# Patient Record
Sex: Female | Born: 1935 | Race: Black or African American | Hispanic: No | Smoking: Former smoker
Health system: Southern US, Community
[De-identification: ages and names within clinical notes are randomized; demographics above are authoritative.]

## PROBLEM LIST (undated history)

## (undated) DIAGNOSIS — Z8673 Personal history of transient ischemic attack (TIA), and cerebral infarction without residual deficits: Secondary | ICD-10-CM

## (undated) DIAGNOSIS — M75100 Unspecified rotator cuff tear or rupture of unspecified shoulder, not specified as traumatic: Secondary | ICD-10-CM

## (undated) DIAGNOSIS — Z9181 History of falling: Secondary | ICD-10-CM

## (undated) DIAGNOSIS — R29898 Other symptoms and signs involving the musculoskeletal system: Secondary | ICD-10-CM

## (undated) DIAGNOSIS — IMO0002 Reserved for concepts with insufficient information to code with codable children: Secondary | ICD-10-CM

## (undated) DIAGNOSIS — I5032 Chronic diastolic (congestive) heart failure: Secondary | ICD-10-CM

## (undated) DIAGNOSIS — I10 Essential (primary) hypertension: Secondary | ICD-10-CM

## (undated) DIAGNOSIS — K219 Gastro-esophageal reflux disease without esophagitis: Secondary | ICD-10-CM

## (undated) DIAGNOSIS — K279 Peptic ulcer, site unspecified, unspecified as acute or chronic, without hemorrhage or perforation: Secondary | ICD-10-CM

## (undated) DIAGNOSIS — Z9289 Personal history of other medical treatment: Secondary | ICD-10-CM

## (undated) DIAGNOSIS — G5603 Carpal tunnel syndrome, bilateral upper limbs: Secondary | ICD-10-CM

## (undated) DIAGNOSIS — E785 Hyperlipidemia, unspecified: Secondary | ICD-10-CM

## (undated) DIAGNOSIS — M1990 Unspecified osteoarthritis, unspecified site: Secondary | ICD-10-CM

## (undated) DIAGNOSIS — I340 Nonrheumatic mitral (valve) insufficiency: Secondary | ICD-10-CM

## (undated) DIAGNOSIS — E1140 Type 2 diabetes mellitus with diabetic neuropathy, unspecified: Secondary | ICD-10-CM

## (undated) DIAGNOSIS — Z9989 Dependence on other enabling machines and devices: Secondary | ICD-10-CM

## (undated) DIAGNOSIS — H269 Unspecified cataract: Secondary | ICD-10-CM

## (undated) HISTORY — DX: Personal history of other medical treatment: Z92.89

## (undated) HISTORY — PX: APPENDECTOMY: SHX54

## (undated) HISTORY — PX: CARDIOVASCULAR STRESS TEST: SHX262

## (undated) HISTORY — PX: EYE SURGERY: SHX253

## (undated) HISTORY — PX: SPINE SURGERY: SHX786

## (undated) HISTORY — DX: Chronic diastolic (congestive) heart failure: I50.32

## (undated) HISTORY — DX: Nonrheumatic mitral (valve) insufficiency: I34.0

## (undated) HISTORY — DX: Peptic ulcer, site unspecified, unspecified as acute or chronic, without hemorrhage or perforation: K27.9

## (undated) HISTORY — PX: TRANSTHORACIC ECHOCARDIOGRAM: SHX275

---

## 1964-05-26 HISTORY — PX: ABDOMINAL HYSTERECTOMY: SHX81

## 1998-02-13 ENCOUNTER — Encounter: Payer: Self-pay | Admitting: Ophthalmology

## 1998-02-15 ENCOUNTER — Ambulatory Visit (HOSPITAL_COMMUNITY): Admission: RE | Admit: 1998-02-15 | Discharge: 1998-02-15 | Payer: Self-pay | Admitting: Ophthalmology

## 1998-05-03 ENCOUNTER — Ambulatory Visit (HOSPITAL_COMMUNITY): Admission: RE | Admit: 1998-05-03 | Discharge: 1998-05-03 | Payer: Self-pay | Admitting: Ophthalmology

## 1998-05-21 ENCOUNTER — Ambulatory Visit (HOSPITAL_COMMUNITY): Admission: RE | Admit: 1998-05-21 | Discharge: 1998-05-21 | Payer: Self-pay | Admitting: Ophthalmology

## 1998-05-21 ENCOUNTER — Emergency Department (HOSPITAL_COMMUNITY): Admission: EM | Admit: 1998-05-21 | Discharge: 1998-05-21 | Payer: Self-pay | Admitting: Emergency Medicine

## 1998-07-16 ENCOUNTER — Ambulatory Visit (HOSPITAL_COMMUNITY): Admission: RE | Admit: 1998-07-16 | Discharge: 1998-07-16 | Payer: Self-pay | Admitting: Ophthalmology

## 1999-09-18 ENCOUNTER — Other Ambulatory Visit: Admission: RE | Admit: 1999-09-18 | Discharge: 1999-09-18 | Payer: Self-pay | Admitting: Emergency Medicine

## 2001-02-24 ENCOUNTER — Encounter: Payer: Self-pay | Admitting: Emergency Medicine

## 2001-02-24 ENCOUNTER — Encounter: Admission: RE | Admit: 2001-02-24 | Discharge: 2001-02-24 | Payer: Self-pay | Admitting: Emergency Medicine

## 2001-09-15 ENCOUNTER — Encounter: Payer: Self-pay | Admitting: Emergency Medicine

## 2001-09-15 ENCOUNTER — Encounter: Admission: RE | Admit: 2001-09-15 | Discharge: 2001-09-15 | Payer: Self-pay | Admitting: Emergency Medicine

## 2002-04-20 ENCOUNTER — Emergency Department (HOSPITAL_COMMUNITY): Admission: EM | Admit: 2002-04-20 | Discharge: 2002-04-20 | Payer: Self-pay | Admitting: Emergency Medicine

## 2002-04-20 ENCOUNTER — Encounter: Payer: Self-pay | Admitting: Emergency Medicine

## 2002-10-06 ENCOUNTER — Encounter: Payer: Self-pay | Admitting: Emergency Medicine

## 2002-10-06 ENCOUNTER — Encounter: Admission: RE | Admit: 2002-10-06 | Discharge: 2002-10-06 | Payer: Self-pay | Admitting: Emergency Medicine

## 2003-06-03 ENCOUNTER — Emergency Department (HOSPITAL_COMMUNITY): Admission: EM | Admit: 2003-06-03 | Discharge: 2003-06-03 | Payer: Self-pay | Admitting: Emergency Medicine

## 2003-08-23 ENCOUNTER — Encounter: Admission: RE | Admit: 2003-08-23 | Discharge: 2003-08-23 | Payer: Self-pay

## 2003-09-19 ENCOUNTER — Encounter: Admission: RE | Admit: 2003-09-19 | Discharge: 2003-09-19 | Payer: Self-pay | Admitting: Emergency Medicine

## 2003-11-17 ENCOUNTER — Ambulatory Visit (HOSPITAL_COMMUNITY): Admission: RE | Admit: 2003-11-17 | Discharge: 2003-11-17 | Payer: Self-pay | Admitting: Emergency Medicine

## 2003-11-21 ENCOUNTER — Encounter: Admission: RE | Admit: 2003-11-21 | Discharge: 2003-11-21 | Payer: Self-pay | Admitting: Ophthalmology

## 2004-02-05 ENCOUNTER — Encounter: Admission: RE | Admit: 2004-02-05 | Discharge: 2004-02-05 | Payer: Self-pay | Admitting: Emergency Medicine

## 2004-09-16 ENCOUNTER — Ambulatory Visit: Payer: Self-pay | Admitting: Internal Medicine

## 2004-09-30 ENCOUNTER — Ambulatory Visit: Payer: Self-pay | Admitting: Internal Medicine

## 2004-12-23 ENCOUNTER — Encounter: Admission: RE | Admit: 2004-12-23 | Discharge: 2004-12-23 | Payer: Self-pay | Admitting: Emergency Medicine

## 2008-07-19 ENCOUNTER — Inpatient Hospital Stay (HOSPITAL_COMMUNITY): Admission: EM | Admit: 2008-07-19 | Discharge: 2008-07-22 | Payer: Self-pay | Admitting: Emergency Medicine

## 2008-11-23 ENCOUNTER — Emergency Department (HOSPITAL_COMMUNITY): Admission: EM | Admit: 2008-11-23 | Discharge: 2008-11-23 | Payer: Self-pay | Admitting: Emergency Medicine

## 2009-05-28 ENCOUNTER — Emergency Department (HOSPITAL_COMMUNITY): Admission: EM | Admit: 2009-05-28 | Discharge: 2009-05-28 | Payer: Self-pay | Admitting: Emergency Medicine

## 2010-01-11 ENCOUNTER — Ambulatory Visit (HOSPITAL_COMMUNITY): Admission: RE | Admit: 2010-01-11 | Discharge: 2010-01-11 | Payer: Self-pay | Admitting: Family Medicine

## 2010-08-11 LAB — CBC
MCV: 92.9 fL (ref 78.0–100.0)
Platelets: 298 10*3/uL (ref 150–400)
RBC: 3.51 MIL/uL — ABNORMAL LOW (ref 3.87–5.11)

## 2010-08-11 LAB — DIFFERENTIAL
Basophils Absolute: 0 10*3/uL (ref 0.0–0.1)
Lymphocytes Relative: 16 % (ref 12–46)
Lymphs Abs: 1.5 10*3/uL (ref 0.7–4.0)
Monocytes Absolute: 0.4 10*3/uL (ref 0.1–1.0)
Monocytes Relative: 4 % (ref 3–12)
Neutro Abs: 7.3 10*3/uL (ref 1.7–7.7)

## 2010-08-11 LAB — BASIC METABOLIC PANEL
CO2: 28 mEq/L (ref 19–32)
Chloride: 106 mEq/L (ref 96–112)
GFR calc Af Amer: 54 mL/min — ABNORMAL LOW (ref >60)
Potassium: 4 mEq/L (ref 3.5–5.1)
Sodium: 141 mEq/L (ref 135–145)

## 2010-09-10 LAB — BASIC METABOLIC PANEL
CO2: 26 mEq/L (ref 19–32)
Chloride: 108 mEq/L (ref 96–112)
Creatinine, Ser: 1.12 mg/dL (ref 0.4–1.2)
GFR calc Af Amer: 56 mL/min — ABNORMAL LOW (ref >60)
Potassium: 3.7 mEq/L (ref 3.5–5.1)
Sodium: 140 mEq/L (ref 135–145)

## 2010-09-10 LAB — GLUCOSE, CAPILLARY
Glucose-Capillary: 119 mg/dL — ABNORMAL HIGH (ref 70–99)
Glucose-Capillary: 135 mg/dL — ABNORMAL HIGH (ref 70–99)
Glucose-Capillary: 67 mg/dL — ABNORMAL LOW (ref 70–99)
Glucose-Capillary: 69 mg/dL — ABNORMAL LOW (ref 70–99)
Glucose-Capillary: 80 mg/dL (ref 70–99)
Glucose-Capillary: 96 mg/dL (ref 70–99)

## 2010-09-10 LAB — COMPREHENSIVE METABOLIC PANEL
ALT: 19 U/L (ref 0–35)
AST: 23 U/L (ref 0–37)
Albumin: 3.6 g/dL (ref 3.5–5.2)
BUN: 20 mg/dL (ref 6–23)
Chloride: 107 mEq/L (ref 96–112)
Creatinine, Ser: 1.37 mg/dL — ABNORMAL HIGH (ref 0.4–1.2)
GFR calc Af Amer: 45 mL/min — ABNORMAL LOW (ref >60)
GFR calc non Af Amer: 37 mL/min — ABNORMAL LOW (ref >60)
Glucose, Bld: 131 mg/dL — ABNORMAL HIGH (ref 70–99)
Sodium: 141 mEq/L (ref 135–145)
Total Protein: 6.8 g/dL (ref 6.0–8.3)

## 2010-09-10 LAB — URINALYSIS, ROUTINE W REFLEX MICROSCOPIC
Hgb urine dipstick: NEGATIVE
Ketones, ur: NEGATIVE mg/dL
Nitrite: NEGATIVE
Protein, ur: NEGATIVE mg/dL
Urobilinogen, UA: 0.2 mg/dL (ref 0.0–1.0)

## 2010-09-10 LAB — URINE CULTURE: Colony Count: 40000

## 2010-09-10 LAB — POCT CARDIAC MARKERS
CKMB, poc: 1.8 ng/mL (ref 1.0–8.0)
Myoglobin, poc: 112 ng/mL (ref 12–200)
Troponin i, poc: <0.05 ng/mL (ref 0.00–0.09)

## 2010-09-10 LAB — CBC
HCT: 35.4 % — ABNORMAL LOW (ref 36.0–46.0)
Hemoglobin: 11.3 g/dL — ABNORMAL LOW (ref 12.0–15.0)
MCHC: 33.7 g/dL (ref 30.0–36.0)
MCV: 91.7 fL (ref 78.0–100.0)
Platelets: 246 10*3/uL (ref 150–400)
RBC: 3.7 MIL/uL — ABNORMAL LOW (ref 3.87–5.11)
WBC: 7.3 10*3/uL (ref 4.0–10.5)
WBC: 7.7 10*3/uL (ref 4.0–10.5)

## 2010-09-10 LAB — BRAIN NATRIURETIC PEPTIDE: Pro B Natriuretic peptide (BNP): 103 pg/mL — ABNORMAL HIGH (ref 0.0–100.0)

## 2010-09-10 LAB — DIFFERENTIAL
Basophils Absolute: 0 10*3/uL (ref 0.0–0.1)
Eosinophils Absolute: 0.2 10*3/uL (ref 0.0–0.7)
Eosinophils Absolute: 0.2 10*3/uL (ref 0.0–0.7)
Eosinophils Relative: 2 % (ref 0–5)
Lymphs Abs: 1.9 10*3/uL (ref 0.7–4.0)
Monocytes Absolute: 0.5 10*3/uL (ref 0.1–1.0)
Monocytes Relative: 7 % (ref 3–12)
Monocytes Relative: 7 % (ref 3–12)
Neutrophils Relative %: 64 % (ref 43–77)

## 2010-09-10 LAB — HEMOGLOBIN A1C: Mean Plasma Glucose: 134 mg/dL

## 2010-10-08 NOTE — Discharge Summary (Signed)
[name], [name]                ACCOUNT NO.:  [id number]   MEDICAL RECORD NO.:  [id number]          PATIENT TYPE:  INP   LOCATION:  [id number]                          FACILITY:  [facility]   PHYSICIAN:  Bonnielee Haff, MD     DATE OF BIRTH:  [birth year]   DATE OF ADMISSION:  07/19/2008  DATE OF DISCHARGE:  02/27/2010LH                               DISCHARGE SUMMARY   PRIMARY CARE PHYSICIAN:  Iona Beard, M.D.   DISCHARGE DIAGNOSES:  1. Left lower lobe pneumonia, improved.  2. Hypertension, stable.  3. Bronchitis, stable.   Please review H&P dictated by Dr.Medaiyese for details regarding the  patient's presenting illness.   BRIEF HOSPITAL COURSE:  Briefly, this is an 77 year old African American  female who presented with increasing shortness of breath and cough for 1  week.  She was found to have a left lower lobe pneumonia.  The patient  was started on IV antibiotics and she slowly improved.  She was also  found to be wheezing and a beta-blocker was held.  She was started on  nebulizer treatments and that has also improved quite significantly.  She was not started on steroids as she did not require it at this time.   Her antibiotic was changed over to p.o. yesterday.  She is very keen on  going home today.   She does not have any complaints.  She mentioned her cough and wheezing  both are better.  Examination reveals lungs actually are quite clear to  auscultation.  No wheezing, rales or rhonchi.  Cardiovascular system is  normal and regular.  Vital signs are all stable.  Blood pressure was  142/72 this morning, saturation 97% on room air.   So overall I think she is quite stable for discharge.  She was noted to  have a blood sugar of 80 on one occasion.  I have discussed this issue  with her and I have told her that it might be desirable if she  experiences more low blood sugars to cut her dose of glipizide to 5 mg,  but I have told her to discuss this with her primary medical  doctor.   I also told her to stop taking her metoprolol if she experiences a lot  of wheezing at home.   She needs to avoid exposure to cold.  I have also asked her to see her  doctor within the next 3-4 days.   DISCHARGE MEDICATIONS:  1. Levaquin 5 mg daily for 4 days starting tomorrow July 23, 2008.  2. Albuterol MDI 2 puffs inhaled 4-6 hours as needed for wheezing.  3. Lisinopril 40 mg daily.  4. Glipizide 10 mg daily.  5. Allopurinol 3 mg daily.  6. Maalox 15 mg daily.  7. Probenecid 500 mg daily.  8. Lopressor 25 mg daily.  9. Amlodipine 10 mg daily.   FOLLOW UP:  With Dr. Berdine Addison within 1 week.   DIET:  Modified carbohydrate.   PHYSICAL ACTIVITY:  Increase activity slowly.  No exertion and no  exposure to cold in the next 1 week.   No  consultations obtained during this admission.   Total time of this encounter 35 minutes.      Bonnielee Haff, MD  Electronically Signed     GK/MEDQ  D:  07/22/2008  T:  07/22/2008  Job:  [id number]   cc:   Barrie Folk. Berdine Addison, MD  Fax: [phone]

## 2010-10-08 NOTE — H&P (Signed)
[name], [name]                ACCOUNT NO.:  [id number]   MEDICAL RECORD NO.:  [id number]          PATIENT TYPE:  INP   LOCATION:  IC02                          FACILITY:  [facility]   PHYSICIAN:  Audria Nine, M.D.DATE OF BIRTH:  14-Jan-1924   DATE OF ADMISSION:  07/19/2008  DATE OF DISCHARGE:  LH                              HISTORY & PHYSICAL   ADMISSION DIAGNOSES:  1. Left lower lobe pneumonia.  2. Poorly controlled hypertension.  3. Bronchitic asthmatic bronchitis.   CHIEF COMPLAINTS:  Increased shortness of breath and cough over the past  one week.   HISTORY OF PRESENT ILLNESS:  [name] is an 77 year old female who  presented to the emergency room complaining of increasing shortness of  air.  The patient said it started about 7 days ago.  Apparently, there  has been a virus that has been going around the entire family, and  everyone at home has been sick from it.  Coughing up some yellowish  green sputum occasionally with no blood.  She also reports significant  nasal congestion and wheezing.  The patient denies any fevers or chills.  She had some chest discomfort on the right side.   Evaluation in the emergency room with a chest x-ray revealed a left  lower lobe pneumonia.  The patient was started on IV antibiotics  empirically.  The patient remains mostly stable at this time.  The  patient's blood pressure was actually noted to be elevated in the  emergency room.   REVIEW OF SYSTEMS:  As mentioned in history of present illness, the  patient denies any hemoptysis.  No recent weight loss.  No night sweats.  No one tested positive for flu in the home.   PAST MEDICAL HISTORY:  1. Hypertension.  2. Diabetes mellitus.  3. Gout.  4. Hypercholesterolemia.  5. Status post appendectomy.  6. Status post hysterectomy.   SOCIAL HISTORY:  The patient is single, lives with her daughter.  She  quit smoking in 1997.  She does not drink alcohol.  She denies any  illicit  drug use.   MEDICATIONS:  1. Lisinopril 40 mg p.o. once a day.  2. Glipizide 10 mg p.o. once a day.  3. Allopurinol 300 mg p.o. once a day.  4. Meloxicam 50 mg p.o. once a day.  5. Probenecid 500 mg p.o. once a day.  6. Metoprolol 25 mg p.o. once a day.  7. Norvasc 10 mg p.o. once a day.   ALLERGIES:  NO KNOWN DRUG ALLERGIES.   PHYSICAL EXAMINATION:  GENERAL:  The patient was conscious, alert,  comfortable, pleasant, not in acute respiratory distress.  The patient  was having some cough spells while I was there.  VITAL SIGNS:  Blood pressure was 178/82, pulse was 66, respiratory of  28, temperature 98.5 degrees Fahrenheit, oxygen saturation was 98% on  room air.  HEENT:  Exam normocephalic, atraumatic.  Oral mucosa was dry.  No  exudates were noted.  NECK:  Supple.  No JVD or lymphadenopathy.  LUNGS:  Reduced air entry bilaterally, crackles at the left base  posteriorly.  HEART:  S1-S2 regular with no S3, S4, gallops or rubs.  ABDOMEN:  Soft, nontender.  Bowel sounds positive.  No masses palpable.  EXTREMITIES:  No edema, no calf induration or tenderness noted.  CNS:  Grossly intact with no focal neurological deficits.   LABORATORY AND DIAGNOSTIC DATA:  White blood count was 7.7, hemoglobin  11.9, hematocrit 35.4, platelet count was 246 with no left shift.  Sodium is 141, potassium 4.0, chloride 107, CO2 was 25, glucose 131, BUN  of 20, creatinine 1.37, total bilirubin was 0.3, alk-phos 178, AST 23,  ALT of 19, calcium is 9.3.  Cardiac enzymes were negative.  BNP was 103.  Urinalysis was negative.  Urine microscopy was unremarkable.  Chest x-  ray shows a left lower lobe infiltrate.  She had probable atelectasis.   ASSESSMENT/PLAN:  An 77 year old female presenting with increasing  shortness of air.  Chest x-ray suggestive of left lower lobe infiltrate.   PLAN:  1. The patient be admitted to the medical floor.  2. The patient will be placed on Rocephin and Zithromax IV.  3.  The patient will be on DVT prophylaxis with Lovenox.  4. Recent sputum cultures were available.  5. The patient did not show any classic symptoms of the flu.  If the      patient's symptoms continue to persist or worsen, will obtain a      nasopharyngeal swab for the flu virus.  6. I will resume all home blood pressure medications at this time.  7. Will put the patient on a sliding scale insulin.  8. Will put patient on nebulizer therapy for her wheezing.  I do not      see any indications to start her on steroids at this time unless      the wheezing worsens or it becomes a concern.  9. The patient will be placed on a sliding-scale insulin drip.   Discussed the above plan with the patient.  She verbalized full  understanding.      Audria Nine, M.D.  Electronically Signed     AM/MEDQ  D:  07/19/2008  T:  07/20/2008  Job:  [id number]

## 2011-09-05 ENCOUNTER — Other Ambulatory Visit: Payer: Self-pay | Admitting: Orthopedic Surgery

## 2011-09-05 DIAGNOSIS — M5126 Other intervertebral disc displacement, lumbar region: Secondary | ICD-10-CM

## 2011-09-05 DIAGNOSIS — R200 Anesthesia of skin: Secondary | ICD-10-CM

## 2011-09-05 DIAGNOSIS — R531 Weakness: Secondary | ICD-10-CM

## 2011-09-09 ENCOUNTER — Other Ambulatory Visit: Payer: Self-pay | Admitting: Orthopedic Surgery

## 2011-09-09 ENCOUNTER — Ambulatory Visit
Admission: RE | Admit: 2011-09-09 | Discharge: 2011-09-09 | Disposition: A | Discharge status: Home / Self Care | Payer: Medicare Other | Source: Ambulatory Visit | Attending: Orthopedic Surgery | Admitting: Orthopedic Surgery

## 2011-09-09 DIAGNOSIS — R200 Anesthesia of skin: Secondary | ICD-10-CM

## 2011-09-09 DIAGNOSIS — R531 Weakness: Secondary | ICD-10-CM

## 2011-09-09 DIAGNOSIS — M5126 Other intervertebral disc displacement, lumbar region: Secondary | ICD-10-CM

## 2011-09-09 MED ORDER — IOHEXOL 180 MG/ML  SOLN
26.0000 mL | Freq: Once | INTRAMUSCULAR | Status: AC | PRN
  Administered 2011-09-09: 26 mL via INTRATHECAL

## 2011-09-09 MED ORDER — ONDANSETRON HCL 4 MG/2ML IJ SOLN: 4.0000 mg | Freq: Four times a day (QID) | INTRAMUSCULAR | Status: DC | PRN

## 2011-09-09 NOTE — Progress Notes (Signed)
Pt's god daughter with her to listen to discharge instructions and questions were answered, and consent signed.dd

## 2011-09-09 NOTE — Discharge Instructions (Signed)

## 2011-09-09 NOTE — Progress Notes (Signed)
Dr. Jobe Igo in to speak with patient, her daughter and her god-daughter about procedure, complications and plans to continue, if patient willing.  Patient and family state they understand, their questions were answered; are willing to proceed again.  jkl

## 2011-09-12 ENCOUNTER — Encounter (HOSPITAL_BASED_OUTPATIENT_CLINIC_OR_DEPARTMENT_OTHER): Payer: Self-pay | Admitting: *Deleted

## 2011-09-12 NOTE — Progress Notes (Signed)
Left message w/ Elmyra Ricks at Dr. Thompson Caul office for most recent office visit note, ekg and any cardiac studies.

## 2011-09-12 NOTE — Progress Notes (Signed)
Pt instructed npo p mn 2/23 x meds wip sip of water.  To [facility] 2/24 @ 0745.  Needs istat and ekg on arrival.

## 2011-09-17 ENCOUNTER — Encounter (HOSPITAL_BASED_OUTPATIENT_CLINIC_OR_DEPARTMENT_OTHER): Payer: Self-pay | Admitting: Anesthesiology

## 2011-09-17 ENCOUNTER — Ambulatory Visit (HOSPITAL_BASED_OUTPATIENT_CLINIC_OR_DEPARTMENT_OTHER): Payer: Medicare Other | Admitting: Anesthesiology

## 2011-09-17 ENCOUNTER — Ambulatory Visit (HOSPITAL_BASED_OUTPATIENT_CLINIC_OR_DEPARTMENT_OTHER)
Admission: RE | Admit: 2011-09-17 | Discharge: 2011-09-17 | Disposition: A | Discharge status: Home / Self Care | Payer: Medicare Other | Source: Ambulatory Visit | Attending: Orthopedic Surgery | Admitting: Orthopedic Surgery

## 2011-09-17 ENCOUNTER — Encounter (HOSPITAL_BASED_OUTPATIENT_CLINIC_OR_DEPARTMENT_OTHER): Payer: Self-pay | Admitting: *Deleted

## 2011-09-17 ENCOUNTER — Encounter (HOSPITAL_BASED_OUTPATIENT_CLINIC_OR_DEPARTMENT_OTHER)
Admission: RE | Disposition: A | Discharge status: Home / Self Care | Payer: Self-pay | Source: Ambulatory Visit | Attending: Orthopedic Surgery

## 2011-09-17 DIAGNOSIS — Z8673 Personal history of transient ischemic attack (TIA), and cerebral infarction without residual deficits: Secondary | ICD-10-CM | POA: Insufficient documentation

## 2011-09-17 DIAGNOSIS — E1149 Type 2 diabetes mellitus with other diabetic neurological complication: Secondary | ICD-10-CM

## 2011-09-17 DIAGNOSIS — K219 Gastro-esophageal reflux disease without esophagitis: Secondary | ICD-10-CM | POA: Insufficient documentation

## 2011-09-17 DIAGNOSIS — E119 Type 2 diabetes mellitus without complications: Secondary | ICD-10-CM | POA: Insufficient documentation

## 2011-09-17 DIAGNOSIS — G5600 Carpal tunnel syndrome, unspecified upper limb: Secondary | ICD-10-CM | POA: Insufficient documentation

## 2011-09-17 DIAGNOSIS — Z79899 Other long term (current) drug therapy: Secondary | ICD-10-CM | POA: Insufficient documentation

## 2011-09-17 DIAGNOSIS — I10 Essential (primary) hypertension: Secondary | ICD-10-CM | POA: Insufficient documentation

## 2011-09-17 HISTORY — DX: Reserved for concepts with insufficient information to code with codable children: IMO0002

## 2011-09-17 HISTORY — DX: Unspecified osteoarthritis, unspecified site: M19.90

## 2011-09-17 HISTORY — PX: CARPAL TUNNEL RELEASE: SHX101

## 2011-09-17 HISTORY — DX: Carpal tunnel syndrome, bilateral upper limbs: G56.03

## 2011-09-17 HISTORY — DX: Unspecified cataract: H26.9

## 2011-09-17 HISTORY — DX: Gastro-esophageal reflux disease without esophagitis: K21.9

## 2011-09-17 HISTORY — DX: Essential (primary) hypertension: I10

## 2011-09-17 LAB — POCT I-STAT 4, (NA,K, GLUC, HGB,HCT)
Glucose, Bld: 111 mg/dL — ABNORMAL HIGH (ref 70–99)
HCT: 37 % (ref 36.0–46.0)
Hemoglobin: 12.6 g/dL (ref 12.0–15.0)
Potassium: 4.1 meq/L (ref 3.5–5.1)
Sodium: 143 meq/L (ref 135–145)

## 2011-09-17 SURGERY — CARPAL TUNNEL RELEASE
Anesthesia: General | Site: Hand | Laterality: Right | Wound class: Clean

## 2011-09-17 MED ORDER — POVIDONE-IODINE 7.5 % EX SOLN: Freq: Once | CUTANEOUS | Status: DC

## 2011-09-17 MED ORDER — FENTANYL CITRATE 0.05 MG/ML IJ SOLN: 25.0000 ug | INTRAMUSCULAR | Status: DC | PRN

## 2011-09-17 MED ORDER — MEPERIDINE HCL 25 MG/ML IJ SOLN: 6.2500 mg | INTRAMUSCULAR | Status: DC | PRN

## 2011-09-17 MED ORDER — PROPOFOL 10 MG/ML IV EMUL
INTRAVENOUS | Status: DC | PRN
  Administered 2011-09-17: 150 mg via INTRAVENOUS

## 2011-09-17 MED ORDER — ONDANSETRON HCL 4 MG/2ML IJ SOLN
INTRAMUSCULAR | Status: DC | PRN
  Administered 2011-09-17: 4 mg via INTRAVENOUS

## 2011-09-17 MED ORDER — HYDROCODONE-ACETAMINOPHEN 5-325 MG PO TABS: 1.0000 | ORAL_TABLET | Freq: Four times a day (QID) | ORAL | Status: AC | PRN

## 2011-09-17 MED ORDER — 0.9 % SODIUM CHLORIDE (POUR BTL) OPTIME
TOPICAL | Status: DC | PRN
  Administered 2011-09-17: 500 mL

## 2011-09-17 MED ORDER — LACTATED RINGERS IV SOLN: INTRAVENOUS | Status: DC

## 2011-09-17 MED ORDER — FENTANYL CITRATE 0.05 MG/ML IJ SOLN
INTRAMUSCULAR | Status: DC | PRN
  Administered 2011-09-17: 50 ug via INTRAVENOUS

## 2011-09-17 MED ORDER — PROMETHAZINE HCL 25 MG/ML IJ SOLN: 6.2500 mg | INTRAMUSCULAR | Status: DC | PRN

## 2011-09-17 MED ORDER — EPHEDRINE SULFATE 50 MG/ML IJ SOLN
INTRAMUSCULAR | Status: DC | PRN
  Administered 2011-09-17: 10 mg via INTRAVENOUS

## 2011-09-17 MED ORDER — LIDOCAINE HCL (CARDIAC) 20 MG/ML IV SOLN
INTRAVENOUS | Status: DC | PRN
  Administered 2011-09-17: 50 mg via INTRAVENOUS

## 2011-09-17 MED ORDER — LACTATED RINGERS IV SOLN
INTRAVENOUS | Status: DC
  Administered 2011-09-17: 09:00:00 via INTRAVENOUS

## 2011-09-17 SURGICAL SUPPLY — 47 items
BANDAGE CONFORM 3  STR LF (GAUZE/BANDAGES/DRESSINGS) ×2 IMPLANT
BANDAGE ELASTIC 3 VELCRO ST LF (GAUZE/BANDAGES/DRESSINGS) IMPLANT
BANDAGE ELASTIC 4 VELCRO ST LF (GAUZE/BANDAGES/DRESSINGS) ×1 IMPLANT
BLADE SURG 15 STRL LF DISP TIS (BLADE) ×1 IMPLANT
BLADE SURG 15 STRL SS (BLADE) ×2
BNDG CMPR 9X4 STRL LF SNTH (GAUZE/BANDAGES/DRESSINGS) ×1
BNDG ESMARK 4X9 LF (GAUZE/BANDAGES/DRESSINGS) ×2 IMPLANT
CLOTH BEACON ORANGE TIMEOUT ST (SAFETY) ×2 IMPLANT
CORDS BIPOLAR (ELECTRODE) ×2 IMPLANT
COVER TABLE BACK 60X90 (DRAPES) ×2 IMPLANT
DRAPE EXTREMITY T 121X128X90 (DRAPE) ×2 IMPLANT
DRAPE LG THREE QUARTER DISP (DRAPES) ×3 IMPLANT
DRAPE U-SHAPE 47X51 STRL (DRAPES) ×2 IMPLANT
DRSG EMULSION OIL 3X3 NADH (GAUZE/BANDAGES/DRESSINGS) ×2 IMPLANT
DURAPREP 26ML APPLICATOR (WOUND CARE) ×2 IMPLANT
ELECT REM PT RETURN 9FT ADLT (ELECTROSURGICAL)
ELECTRODE REM PT RTRN 9FT ADLT (ELECTROSURGICAL) IMPLANT
GLOVE BIOGEL PI IND STRL 8 (GLOVE) ×1 IMPLANT
GLOVE BIOGEL PI INDICATOR 8 (GLOVE)
GLOVE ECLIPSE 6.5 STRL STRAW (GLOVE) ×1 IMPLANT
GLOVE ECLIPSE 8.0 STRL XLNG CF (GLOVE) ×2 IMPLANT
GLOVE INDICATOR 6.5 STRL GRN (GLOVE) ×1 IMPLANT
GLOVE INDICATOR 8.0 STRL GRN (GLOVE) ×3 IMPLANT
GOWN W/COTTON TOWEL STD LRG (GOWNS) ×2 IMPLANT
GOWN XL W/COTTON TOWEL STD (GOWNS) ×2 IMPLANT
MARKER SKIN DUAL TIP RULER LAB (MISCELLANEOUS) ×2 IMPLANT
NEEDLE 27GAX1X1/2 (NEEDLE) IMPLANT
NEEDLE HYPO 22GX1.5 SAFETY (NEEDLE) IMPLANT
NS IRRIG 500ML POUR BTL (IV SOLUTION) ×2 IMPLANT
PACK BASIN DAY SURGERY FS (CUSTOM PROCEDURE TRAY) ×2 IMPLANT
PAD CAST 3X4 CTTN HI CHSV (CAST SUPPLIES) IMPLANT
PAD CAST 4YDX4 CTTN HI CHSV (CAST SUPPLIES) IMPLANT
PADDING CAST ABS 4INX4YD NS (CAST SUPPLIES) ×1
PADDING CAST ABS COTTON 4X4 ST (CAST SUPPLIES) ×1 IMPLANT
PADDING CAST COTTON 3X4 STRL (CAST SUPPLIES) ×2
PADDING CAST COTTON 4X4 STRL (CAST SUPPLIES) ×2
PENCIL BUTTON HOLSTER BLD 10FT (ELECTRODE) IMPLANT
SPLINT PLASTER CAST XFAST 3X15 (CAST SUPPLIES) IMPLANT
SPLINT PLASTER XTRA FASTSET 3X (CAST SUPPLIES) ×10
SPONGE GAUZE 4X4 12PLY (GAUZE/BANDAGES/DRESSINGS) ×2 IMPLANT
STOCKINETTE 4X48 STRL (DRAPES) ×2 IMPLANT
SUT ETHILON 4 0 PS 2 18 (SUTURE) ×5 IMPLANT
SYR BULB 3OZ (MISCELLANEOUS) ×2 IMPLANT
SYR CONTROL 10ML LL (SYRINGE) IMPLANT
TOWEL OR 17X24 6PK STRL BLUE (TOWEL DISPOSABLE) ×4 IMPLANT
UNDERPAD 30X30 INCONTINENT (UNDERPADS AND DIAPERS) ×2 IMPLANT
WATER STERILE IRR 500ML POUR (IV SOLUTION) ×1 IMPLANT

## 2011-09-17 NOTE — Anesthesia Preprocedure Evaluation (Signed)
Anesthesia Evaluation  Patient identified by MRN, date of birth, ID band Patient awake    Reviewed: Allergy & Precautions, H&P , NPO status , Patient's Chart, lab work & pertinent test results  Airway Mallampati: II TM Distance: >3 FB Neck ROM: Full    Dental No notable dental hx. (+) Upper Dentures   Pulmonary neg pulmonary ROS,  breath sounds clear to auscultation  Pulmonary exam normal       Cardiovascular hypertension, Pt. on medications negative cardio ROS  Rhythm:Regular Rate:Normal     Neuro/Psych  Neuromuscular disease negative neurological ROS  negative psych ROS   GI/Hepatic negative GI ROS, Neg liver ROS,   Endo/Other  negative endocrine ROSDiabetes mellitus-, Type 2, Oral Hypoglycemic Agents  Renal/GU negative Renal ROS  negative genitourinary   Musculoskeletal negative musculoskeletal ROS (+)   Abdominal   Peds negative pediatric ROS (+)  Hematology negative hematology ROS (+)   Anesthesia Other Findings   Reproductive/Obstetrics negative OB ROS                           Anesthesia Physical Anesthesia Plan  ASA: III  Anesthesia Plan: General   Post-op Pain Management:    Induction: Intravenous  Airway Management Planned: LMA  Additional Equipment:   Intra-op Plan:   Post-operative Plan: Extubation in OR  Informed Consent: I have reviewed the patients History and Physical, chart, labs and discussed the procedure including the risks, benefits and alternatives for the proposed anesthesia with the patient or authorized representative who has indicated his/her understanding and acceptance.   Dental advisory given  Plan Discussed with: CRNA  Anesthesia Plan Comments:         Anesthesia Quick Evaluation

## 2011-09-17 NOTE — Transfer of Care (Signed)
Immediate Anesthesia Transfer of Care Note  Patient: [name]  Procedure(s) Performed: Procedure(s) (LRB): CARPAL TUNNEL RELEASE (Right)  Patient Location: PACU  Anesthesia Type: General  Level of Consciousness: awake  Airway & Oxygen Therapy: Patient Spontanous Breathing and Patient connected to face mask oxygen  Post-op Assessment: Report given to PACU RN and Post -op Vital signs reviewed and stable  Post vital signs: Reviewed and stable  Complications: No apparent anesthesia complications

## 2011-09-17 NOTE — Discharge Instructions (Signed)
Carpal Tunnel Release (Repair) Care After Refer to this sheet in the next few weeks. These discharge instructions provide you with general information on caring for yourself after you leave the hospital. Your caregiver may also give you specific instructions. Your treatment has been planned according to the most current medical practices available, but unavoidable complications sometimes occur. If you have any problems or questions after discharge, please call your caregiver. HOME CARE INSTRUCTIONS   Have a responsible person with you for 24 hours.   Do not drive a car or take public transportation for 24 hours.   Only take over-the-counter or prescription medicines for pain, discomfort, or fever as directed by your caregiver. Take them as directed.   You may put ice on the palm side of the affected wrist.   Put ice in a plastic bag.   Place a towel between your skin and the bag.   Leave the ice on for 15 to 20 minutes, 3 to 4 times per day.   If you were given a splint to keep your wrist from bending, use it as directed. It is important to wear the splint at night or as directed. Use the splint for as long as you have pain or numbness in your hand, arm, or wrist. This may take 1 to 2 months.   Keep your hand raised (elevated) above the level of your heart as much as possible. This keeps swelling down and helps with discomfort.   Change bandages (dressings) as directed.   Keep the wound clean and dry.  SEEK MEDICAL CARE IF:   You develop pain not relieved with medicines.   You develop numbness of your hand.   You develop bleeding from your surgical site.   You have an oral temperature above 102 F (38.9 C).   You develop redness or swelling of the surgical site.   You develop new, unexplained problems.  SEEK IMMEDIATE MEDICAL CARE IF:   You develop a rash.   You have difficulty breathing.   You develop any reaction or side effects to medicines given.  MAKE SURE YOU:    Understand these instructions.   Will watch your condition.   Will get help right away if you are not doing well or get worse.  Document Released: 11/29/2004 Document Revised: 05/01/2011 Document Reviewed: 03/18/2007 ExitCare Patient Information 2012 [place], [facility] Post Anesthesia Home Care Instructions  Activity: Get plenty of rest for the remainder of the day. A responsible adult should stay with you for 24 hours following the procedure.  For the next 24 hours, DO NOT: -Drive a car -Paediatric nurse -Drink alcoholic beverages -Take any medication unless instructed by your physician -Make any legal decisions or sign important papers.  Meals: Start with liquid foods such as gelatin or soup. Progress to regular foods as tolerated. Avoid greasy, spicy, heavy foods. If nausea and/or vomiting occur, drink only clear liquids until the nausea and/or vomiting subsides. Call your physician if vomiting continues.  Special Instructions/Symptoms: Your throat may feel dry or sore from the anesthesia or the breathing tube placed in your throat during surgery. If this causes discomfort, gargle with warm salt water. The discomfort should disappear within 24 hours.  Marland Kitchen

## 2011-09-17 NOTE — Brief Op Note (Signed)
09/17/2011  11:24 AM  PATIENT:  [name]  78 y.o. female  PRE-OPERATIVE DIAGNOSIS:  RIGHT CARPAL TUNNEL SYNDROME  POST-OPERATIVE DIAGNOSIS:  RIGHT CARPAL TUNNEL SYNDROME  PROCEDURE:  Procedure(s) (LRB): CARPAL TUNNEL RELEASE (Right)  SURGEON:  Surgeon(s) and Role:    * Magnus Sinning, MD - Primary  PHYSICIAN ASSISTANT:   ASSISTANTS: nurse   ANESTHESIA:   general  EBL:  Total I/O In: 500 [I.V.:500] Out: -   BLOOD ADMINISTERED:none  DRAINS: none   LOCAL MEDICATIONS USED:  NONE  SPECIMEN:  No Specimen  DISPOSITION OF SPECIMEN:  N/A  COUNTS:  YES  TOURNIQUET:   Total Tourniquet Time Documented: Upper Arm (Right) - 37 minutes  DICTATION: .Other Dictation: Dictation Number [id number]  PLAN OF CARE: Discharge to home after PACU  PATIENT DISPOSITION:  PACU - hemodynamically stable.   Delay start of Pharmacological VTE agent (>24hrs) due to surgical blood loss or risk of bleeding: not applicable

## 2011-09-17 NOTE — H&P (Signed)
[name] is an 78 y.o. female.   Chief Complaint: pain and numbness rt hand [id number] symptoms with abnormal NCV's  Past Medical History  Diagnosis Date  . Hypertension   . Stroke     mini stroke yrs ago  . Diabetes mellitus   . GERD (gastroesophageal reflux disease)   . Arthritis   . Slipped intervertebral disc   . Bilateral carpal tunnel syndrome   . Glaucoma   . Cataracts, both eyes     Past Surgical History  Procedure Date  . Abdominal hysterectomy     History reviewed. No pertinent family history. Social History:  reports that she has never smoked. She has never used smokeless tobacco. She reports that she does not drink alcohol. Her drug history not on file.  Allergies: No Known Allergies  Medications Prior to Admission  Medication Sig Dispense Refill  . allopurinol (ZYLOPRIM) 300 MG tablet Take 300 mg by mouth daily.      Marland Kitchen amLODipine (NORVASC) 10 MG tablet Take 10 mg by mouth daily.      Marland Kitchen b complex vitamins tablet Take 1 tablet by mouth daily.      . Difluprednate (DUREZOL OP) Apply to eye 2 (two) times daily.      Marland Kitchen glipiZIDE (GLUCOTROL) 10 MG tablet Take 10 mg by mouth 2 (two) times daily before a meal.      . hydrALAZINE (APRESOLINE) 10 MG tablet Take 10 mg by mouth 3 (three) times daily.      Marland Kitchen lisinopril (PRINIVIL,ZESTRIL) 40 MG tablet Take 40 mg by mouth daily.      Marland Kitchen loteprednol (LOTEMAX) 0.5 % ophthalmic suspension Place 1 drop into the left eye 2 (two) times daily.      . meloxicam (MOBIC) 7.5 MG tablet Take 15 mg by mouth daily.      . metoprolol (LOPRESSOR) 50 MG tablet Take 50 mg by mouth 2 (two) times daily.      . pravastatin (PRAVACHOL) 20 MG tablet Take 20 mg by mouth daily.      . probenecid (BENEMID) 500 MG tablet Take 500 mg by mouth 2 (two) times daily.      . ranitidine (ZANTAC) 150 MG tablet Take 150 mg by mouth 2 (two) times daily.      . timolol (BETIMOL) 0.25 % ophthalmic solution Place 1 drop into both eyes 2 (two) times daily.         Results for orders placed during the hospital encounter of 09/17/11 (from the past 48 hour(s))  POCT I-STAT 4, (NA,K, GLUC, HGB,HCT)     Status: Abnormal   Collection Time   09/17/11  8:35 AM      Component Value Range Comment   Sodium 143  135 - 145 (mEq/L)    Potassium 4.1  3.5 - 5.1 (mEq/L)    Glucose, Bld 111 (*) 70 - 99 (mg/dL)    HCT 37.0  36.0 - 46.0 (%)    Hemoglobin 12.6  12.0 - 15.0 (g/dL)    No results found.  ROS  Blood pressure 133/72, pulse 56, temperature 97.4 F (36.3 C), temperature source Oral, resp. rate 18, height 5\' 4"  (1.626 m), weight 85.73 kg (189 lb), SpO2 98.00%. Physical Exam  Constitutional: She is oriented to person, place, and time. She appears well-developed and well-nourished.  HENT:  Head: Normocephalic and atraumatic.  Right Ear: External ear normal.  Left Ear: External ear normal.  Eyes: Conjunctivae and EOM are normal. Pupils are equal, round,  and reactive to light.  Neck: Normal range of motion. Neck supple.  Cardiovascular: Normal rate, regular rhythm, normal heart sounds and intact distal pulses.   Respiratory: Effort normal and breath sounds normal.  GI: Soft. Bowel sounds are normal.  Musculoskeletal: Normal range of motion.       Decreased sensation median nerve digits rt hand  Neurological: She is alert and oriented to person, place, and time. She has normal reflexes.  Skin: Skin is warm and dry.  Psychiatric: She has a normal mood and affect. Her behavior is normal. Judgment and thought content normal.     Assessment/Plan Right carpal tunnel syndrome Decompression median nerve rt wrist and hand  Loucinda Croy P 09/17/2011, 9:57 AM

## 2011-09-17 NOTE — Anesthesia Postprocedure Evaluation (Signed)
  Anesthesia Post-op Note  Patient: [name]  Procedure(s) Performed: Procedure(s) (LRB): CARPAL TUNNEL RELEASE (Right)  Patient Location: PACU  Anesthesia Type: General  Level of Consciousness: awake and alert   Airway and Oxygen Therapy: Patient Spontanous Breathing  Post-op Pain: mild  Post-op Assessment: Post-op Vital signs reviewed, Patient's Cardiovascular Status Stable, Respiratory Function Stable, Patent Airway and No signs of Nausea or vomiting  Post-op Vital Signs: stable  Complications: No apparent anesthesia complications

## 2011-09-18 ENCOUNTER — Encounter (HOSPITAL_BASED_OUTPATIENT_CLINIC_OR_DEPARTMENT_OTHER): Payer: Self-pay | Admitting: Orthopedic Surgery

## 2011-09-18 NOTE — Op Note (Signed)
[name], [name]                ACCOUNT NO.:  [id number]  MEDICAL RECORD NO.:  [id number]  LOCATION:                               FACILITY:  [facility]  PHYSICIAN:  Tarri Glenn, M.D.  DATE OF BIRTH:  01/03/1924  DATE OF PROCEDURE:  09/17/2011 DATE OF DISCHARGE:                              OPERATIVE REPORT   POSTOPERATIVE DIAGNOSIS:  Carpal tunnel syndrome, right upper extremity.  POSTOPERATIVE DIAGNOSIS:  Carpal tunnel syndrome, right upper extremity.  OPERATION:  Decompression median nerve, right wrist and hand.  SURGEON:  Tarri Glenn, M.D.  ASSISTANT:  Nurse.  ANESTHESIA:  General.  PATHOLOGY AND JUSTIFICATION FOR PROCEDURE:  Pain and numbness in her right hand with nerve conduction studies, demonstrating a significant carpal tunnel syndrome that is median nerve compression, which is why she is here today for the above-mentioned surgery.  PROCEDURE:  Under satisfactory general anesthesia, pneumatic tourniquet with right arm Esmarched out nonsterilely and tourniquet inflated to 250 mmHg.  Right arm was then prepped with DuraPrep from midforearm to fingertips, draped in sterile field.  Time-out performed.  I marked out a curved incision along the base of thenar eminence, crossing obliquely over the flexor crease of the wrist and the distal forearm.  Palmaris longus tendon was identified and retracted to the ulnar side.  The median nerve was then dissected out and found to be quite bulbous and discolored even proximal to the flexor crease of the wrist.  I then released the skin, subcutaneous tissue, and thick transverse fascia into the distal palm.  She had significant compression median nerve in the palm as well, along with discoloration of the nerve.  When the decompression had been completed, I irrigated the wound well with sterile saline and closed the skin and subcutaneous tissue only with interrupted 4-0 nylon mattress sutures.  Betadine, Adaptic, dry  sterile dressings were applied.  Tourniquet was released that should be Betadine, Adaptic dry sterile dressing, and a volar plaster splint were applied.  Tourniquet was released.  She tolerated the procedure well and was taken to the recovery room in satisfactory condition with no known complications.          ______________________________ Tarri Glenn, M.D.    JA/MEDQ  D:  09/17/2011  T:  09/18/2011  Job:  [id number]

## 2011-10-07 ENCOUNTER — Other Ambulatory Visit: Payer: Self-pay | Admitting: Orthopedic Surgery

## 2011-10-14 ENCOUNTER — Encounter (HOSPITAL_BASED_OUTPATIENT_CLINIC_OR_DEPARTMENT_OTHER): Payer: Self-pay | Admitting: *Deleted

## 2011-10-14 NOTE — Progress Notes (Signed)
NPO AFTER MN. ARRIVES AT 0900. NEEDS ISTAT. CURRENT EKG IN EPIC AND CHART. WILL TAKE LOPRESSOR, NORVASC, AND ZANTAC AM OF SURG. W/ SIPS OF WATER. PT MODERATE FALL RISK, RIGHT LEG WEAKNESS, USES WALKER.

## 2011-10-22 ENCOUNTER — Encounter (HOSPITAL_BASED_OUTPATIENT_CLINIC_OR_DEPARTMENT_OTHER)
Admission: RE | Disposition: A | Discharge status: Home / Self Care | Payer: Self-pay | Source: Ambulatory Visit | Attending: Orthopedic Surgery

## 2011-10-22 ENCOUNTER — Encounter (HOSPITAL_BASED_OUTPATIENT_CLINIC_OR_DEPARTMENT_OTHER): Payer: Self-pay | Admitting: *Deleted

## 2011-10-22 ENCOUNTER — Encounter (HOSPITAL_BASED_OUTPATIENT_CLINIC_OR_DEPARTMENT_OTHER): Payer: Self-pay | Admitting: Certified Registered"

## 2011-10-22 ENCOUNTER — Ambulatory Visit (HOSPITAL_BASED_OUTPATIENT_CLINIC_OR_DEPARTMENT_OTHER): Payer: Medicare Other | Admitting: Certified Registered"

## 2011-10-22 ENCOUNTER — Ambulatory Visit (HOSPITAL_BASED_OUTPATIENT_CLINIC_OR_DEPARTMENT_OTHER)
Admission: RE | Admit: 2011-10-22 | Discharge: 2011-10-22 | Disposition: A | Discharge status: Home / Self Care | Payer: Medicare Other | Source: Ambulatory Visit | Attending: Orthopedic Surgery | Admitting: Orthopedic Surgery

## 2011-10-22 DIAGNOSIS — Z7982 Long term (current) use of aspirin: Secondary | ICD-10-CM | POA: Insufficient documentation

## 2011-10-22 DIAGNOSIS — Z79899 Other long term (current) drug therapy: Secondary | ICD-10-CM | POA: Insufficient documentation

## 2011-10-22 DIAGNOSIS — H409 Unspecified glaucoma: Secondary | ICD-10-CM | POA: Insufficient documentation

## 2011-10-22 DIAGNOSIS — E119 Type 2 diabetes mellitus without complications: Secondary | ICD-10-CM | POA: Insufficient documentation

## 2011-10-22 DIAGNOSIS — G5600 Carpal tunnel syndrome, unspecified upper limb: Secondary | ICD-10-CM | POA: Insufficient documentation

## 2011-10-22 DIAGNOSIS — K219 Gastro-esophageal reflux disease without esophagitis: Secondary | ICD-10-CM | POA: Insufficient documentation

## 2011-10-22 DIAGNOSIS — Z8673 Personal history of transient ischemic attack (TIA), and cerebral infarction without residual deficits: Secondary | ICD-10-CM | POA: Insufficient documentation

## 2011-10-22 DIAGNOSIS — G5602 Carpal tunnel syndrome, left upper limb: Secondary | ICD-10-CM

## 2011-10-22 DIAGNOSIS — I10 Essential (primary) hypertension: Secondary | ICD-10-CM | POA: Insufficient documentation

## 2011-10-22 HISTORY — DX: Other symptoms and signs involving the musculoskeletal system: R29.898

## 2011-10-22 HISTORY — DX: Dependence on other enabling machines and devices: Z99.89

## 2011-10-22 HISTORY — DX: History of falling: Z91.81

## 2011-10-22 HISTORY — DX: Personal history of transient ischemic attack (TIA), and cerebral infarction without residual deficits: Z86.73

## 2011-10-22 HISTORY — PX: CARPAL TUNNEL RELEASE: SHX101

## 2011-10-22 LAB — POCT I-STAT 4, (NA,K, GLUC, HGB,HCT): Potassium: 4 mEq/L (ref 3.5–5.1)

## 2011-10-22 LAB — GLUCOSE, CAPILLARY: Glucose-Capillary: 104 mg/dL — ABNORMAL HIGH (ref 70–99)

## 2011-10-22 SURGERY — CARPAL TUNNEL RELEASE
Anesthesia: General | Site: Hand | Laterality: Left | Wound class: Clean

## 2011-10-22 MED ORDER — PROPOFOL 10 MG/ML IV EMUL
INTRAVENOUS | Status: DC | PRN
  Administered 2011-10-22: 130 mg via INTRAVENOUS

## 2011-10-22 MED ORDER — HYDROCODONE-ACETAMINOPHEN 5-325 MG PO TABS: 1.0000 | ORAL_TABLET | Freq: Four times a day (QID) | ORAL | Status: AC | PRN

## 2011-10-22 MED ORDER — LIDOCAINE HCL (CARDIAC) 20 MG/ML IV SOLN
INTRAVENOUS | Status: DC | PRN
  Administered 2011-10-22: 50 mg via INTRAVENOUS

## 2011-10-22 MED ORDER — ONDANSETRON HCL 4 MG/2ML IJ SOLN
INTRAMUSCULAR | Status: DC | PRN
  Administered 2011-10-22: 4 mg via INTRAVENOUS

## 2011-10-22 MED ORDER — FENTANYL CITRATE 0.05 MG/ML IJ SOLN
INTRAMUSCULAR | Status: DC | PRN
  Administered 2011-10-22: 50 ug via INTRAVENOUS

## 2011-10-22 MED ORDER — FENTANYL CITRATE 0.05 MG/ML IJ SOLN: 25.0000 ug | INTRAMUSCULAR | Status: DC | PRN

## 2011-10-22 MED ORDER — SODIUM CHLORIDE 0.9 % IR SOLN
Status: DC | PRN
  Administered 2011-10-22: 500 mL

## 2011-10-22 MED ORDER — PROMETHAZINE HCL 25 MG/ML IJ SOLN: 6.2500 mg | INTRAMUSCULAR | Status: DC | PRN

## 2011-10-22 MED ORDER — LACTATED RINGERS IV SOLN
INTRAVENOUS | Status: DC
  Administered 2011-10-22: 100 mL/h via INTRAVENOUS

## 2011-10-22 MED ORDER — POVIDONE-IODINE 7.5 % EX SOLN: Freq: Once | CUTANEOUS | Status: DC

## 2011-10-22 MED ORDER — HYDROCODONE-ACETAMINOPHEN 5-325 MG PO TABS: 1.0000 | ORAL_TABLET | Freq: Four times a day (QID) | ORAL | Status: DC | PRN

## 2011-10-22 MED ORDER — EPHEDRINE SULFATE 50 MG/ML IJ SOLN
INTRAMUSCULAR | Status: DC | PRN
  Administered 2011-10-22: 10 mg via INTRAVENOUS

## 2011-10-22 MED ORDER — LACTATED RINGERS IV SOLN: INTRAVENOUS | Status: DC

## 2011-10-22 SURGICAL SUPPLY — 46 items
BANDAGE CONFORM 3  STR LF (GAUZE/BANDAGES/DRESSINGS) ×2 IMPLANT
BANDAGE ELASTIC 3 VELCRO ST LF (GAUZE/BANDAGES/DRESSINGS) ×1 IMPLANT
BANDAGE ELASTIC 4 VELCRO ST LF (GAUZE/BANDAGES/DRESSINGS) IMPLANT
BLADE SURG 15 STRL LF DISP TIS (BLADE) ×1 IMPLANT
BLADE SURG 15 STRL SS (BLADE) ×2
BNDG CMPR 9X4 STRL LF SNTH (GAUZE/BANDAGES/DRESSINGS) ×1
BNDG ESMARK 4X9 LF (GAUZE/BANDAGES/DRESSINGS) ×2 IMPLANT
CLOTH BEACON ORANGE TIMEOUT ST (SAFETY) ×2 IMPLANT
CORDS BIPOLAR (ELECTRODE) ×2 IMPLANT
COVER TABLE BACK 60X90 (DRAPES) ×2 IMPLANT
DRAPE EXTREMITY T 121X128X90 (DRAPE) ×2 IMPLANT
DRAPE LG THREE QUARTER DISP (DRAPES) ×4 IMPLANT
DRAPE U-SHAPE 47X51 STRL (DRAPES) ×2 IMPLANT
DRSG EMULSION OIL 3X3 NADH (GAUZE/BANDAGES/DRESSINGS) ×2 IMPLANT
DURAPREP 26ML APPLICATOR (WOUND CARE) ×2 IMPLANT
ELECT REM PT RETURN 9FT ADLT (ELECTROSURGICAL) ×2
ELECTRODE REM PT RTRN 9FT ADLT (ELECTROSURGICAL) IMPLANT
GLOVE BIOGEL PI IND STRL 8 (GLOVE) ×1 IMPLANT
GLOVE BIOGEL PI INDICATOR 8 (GLOVE) ×1
GLOVE ECLIPSE 6.0 STRL STRAW (GLOVE) ×1 IMPLANT
GLOVE ECLIPSE 8.0 STRL XLNG CF (GLOVE) ×1 IMPLANT
GLOVE INDICATOR 8.0 STRL GRN (GLOVE) ×2 IMPLANT
GOWN W/COTTON TOWEL STD LRG (GOWNS) ×1 IMPLANT
GOWN XL W/COTTON TOWEL STD (GOWNS) ×1 IMPLANT
MARKER SKIN DUAL TIP RULER LAB (MISCELLANEOUS) ×2 IMPLANT
NEEDLE 27GAX1X1/2 (NEEDLE) IMPLANT
NEEDLE HYPO 22GX1.5 SAFETY (NEEDLE) IMPLANT
NS IRRIG 500ML POUR BTL (IV SOLUTION) ×2 IMPLANT
PACK BASIN DAY SURGERY FS (CUSTOM PROCEDURE TRAY) ×2 IMPLANT
PAD CAST 3X4 CTTN HI CHSV (CAST SUPPLIES) IMPLANT
PAD CAST 4YDX4 CTTN HI CHSV (CAST SUPPLIES) IMPLANT
PADDING CAST ABS 4INX4YD NS (CAST SUPPLIES) ×1
PADDING CAST ABS COTTON 4X4 ST (CAST SUPPLIES) ×1 IMPLANT
PADDING CAST COTTON 3X4 STRL (CAST SUPPLIES)
PADDING CAST COTTON 4X4 STRL (CAST SUPPLIES)
PENCIL BUTTON HOLSTER BLD 10FT (ELECTRODE) IMPLANT
SPLINT PLASTER CAST XFAST 3X15 (CAST SUPPLIES) IMPLANT
SPLINT PLASTER XTRA FASTSET 3X (CAST SUPPLIES)
SPONGE GAUZE 4X4 12PLY (GAUZE/BANDAGES/DRESSINGS) ×2 IMPLANT
STOCKINETTE 4X48 STRL (DRAPES) ×2 IMPLANT
SUT ETHILON 4 0 PS 2 18 (SUTURE) ×4 IMPLANT
SYR BULB 3OZ (MISCELLANEOUS) ×2 IMPLANT
SYR CONTROL 10ML LL (SYRINGE) IMPLANT
TOWEL OR 17X24 6PK STRL BLUE (TOWEL DISPOSABLE) ×4 IMPLANT
UNDERPAD 30X30 INCONTINENT (UNDERPADS AND DIAPERS) ×2 IMPLANT
WATER STERILE IRR 500ML POUR (IV SOLUTION) ×1 IMPLANT

## 2011-10-22 NOTE — Anesthesia Preprocedure Evaluation (Signed)
Anesthesia Evaluation  Patient identified by MRN, date of birth, ID band Patient awake    Reviewed: Allergy & Precautions, H&P , NPO status , Patient's Chart, lab work & pertinent test results  History of Anesthesia Complications Negative for: history of anesthetic complications  Airway Mallampati: II TM Distance: >3 FB Neck ROM: Full    Dental No notable dental hx. (+) Upper Dentures and Dental Advisory Given   Pulmonary neg pulmonary ROS,  breath sounds clear to auscultation  Pulmonary exam normal       Cardiovascular hypertension, Pt. on medications Rhythm:Regular Rate:Normal     Neuro/Psych  Neuromuscular disease negative psych ROS   GI/Hepatic Neg liver ROS, GERD-  Medicated,  Endo/Other  Diabetes mellitus-, Type 2, Oral Hypoglycemic Agents  Renal/GU negative Renal ROS  negative genitourinary   Musculoskeletal negative musculoskeletal ROS (+)   Abdominal   Peds negative pediatric ROS (+)  Hematology negative hematology ROS (+)   Anesthesia Other Findings   Reproductive/Obstetrics negative OB ROS                           Anesthesia Physical Anesthesia Plan  ASA: II  Anesthesia Plan: General   Post-op Pain Management:    Induction: Intravenous  Airway Management Planned: LMA  Additional Equipment:   Intra-op Plan:   Post-operative Plan: Extubation in OR  Informed Consent: I have reviewed the patients History and Physical, chart, labs and discussed the procedure including the risks, benefits and alternatives for the proposed anesthesia with the patient or authorized representative who has indicated his/her understanding and acceptance.   Dental advisory given  Plan Discussed with: CRNA  Anesthesia Plan Comments:         Anesthesia Quick Evaluation

## 2011-10-22 NOTE — Transfer of Care (Signed)
Immediate Anesthesia Transfer of Care Note  Patient: [name]  Procedure(s) Performed: Procedure(s) (LRB): CARPAL TUNNEL RELEASE (Left)  Patient Location: PACU  Anesthesia Type: General  Level of Consciousness: awake, oriented, sedated and patient cooperative  Airway & Oxygen Therapy: Patient Spontanous Breathing and Patient connected to face mask oxygen  Post-op Assessment: Report given to PACU RN and Post -op Vital signs reviewed and stable  Post vital signs: Reviewed and stable  Complications: No apparent anesthesia complications

## 2011-10-22 NOTE — Brief Op Note (Signed)
10/22/2011  10:49 AM  PATIENT:  [name]  78 y.o. female  PRE-OPERATIVE DIAGNOSIS:  LEFT CARPAL TUNNEL  POST-OPERATIVE DIAGNOSIS:  LEFT CARPAL TUNNEL SYNDROME  PROCEDURE:  Procedure(s) (LRB): CARPAL TUNNEL RELEASE (Left)  SURGEON:  Surgeon(s) and Role:    * Magnus Sinning, MD - Primary  PHYSICIAN ASSISTANT:   ASSISTANTS:nurse  ANESTHESIA:   general  EBL:  Total I/O In: 100 [I.V.:100] Out: -   BLOOD ADMINISTERED:none  DRAINS: none   LOCAL MEDICATIONS USED:  NONE  SPECIMEN:  No Specimen  DISPOSITION OF SPECIMEN:  N/A  COUNTS:  YES  TOURNIQUET:   Total Tourniquet Time Documented: Upper Arm (Left) - 36 minutes  DICTATION: .Other Dictation: Dictation Number [id number]  PLAN OF CARE: Discharge to home after PACU  PATIENT DISPOSITION:  PACU - hemodynamically stable.   Delay start of Pharmacological VTE agent (>24hrs) due to surgical blood loss or risk of bleeding: yes

## 2011-10-22 NOTE — Anesthesia Procedure Notes (Signed)
Procedure Name: LMA Insertion Date/Time: 10/22/2011 10:02 AM Performed by: Denna Haggard D Pre-anesthesia Checklist: Patient identified, Emergency Drugs available, Suction available and Patient being monitored Patient Re-evaluated:Patient Re-evaluated prior to inductionOxygen Delivery Method: Circle System Utilized Preoxygenation: Pre-oxygenation with 100% oxygen Intubation Type: IV induction Ventilation: Mask ventilation without difficulty LMA: LMA inserted LMA Size: 4.0 Number of attempts: 1 Airway Equipment and Method: bite block Placement Confirmation: positive ETCO2 Tube secured with: Tape Dental Injury: Teeth and Oropharynx as per pre-operative assessment

## 2011-10-22 NOTE — Discharge Instructions (Signed)
Carpal Tunnel Release (Repair) Care After Refer to this sheet in the next few weeks. These discharge instructions provide you with general information on caring for yourself after you leave the hospital. Your caregiver may also give you specific instructions. Your treatment has been planned according to the most current medical practices available, but unavoidable complications sometimes occur. If you have any problems or questions after discharge, please call your caregiver. HOME CARE INSTRUCTIONS   Have a responsible person with you for 24 hours.   Do not drive a car or take public transportation for 24 hours.   Only take over-the-counter or prescription medicines for pain, discomfort, or fever as directed by your caregiver. Take them as directed.   You may put ice on the palm side of the affected wrist.   Put ice in a plastic bag.   Place a towel between your skin and the bag.   Leave the ice on for 15 to 20 minutes, 3 to 4 times per day.   If you were given a splint to keep your wrist from bending, use it as directed. It is important to wear the splint at night or as directed. Use the splint for as long as you have pain or numbness in your hand, arm, or wrist. This may take 1 to 2 months.   Keep your hand raised (elevated) above the level of your heart as much as possible. This keeps swelling down and helps with discomfort.   Change bandages (dressings) as directed.   Keep the wound clean and dry.  SEEK MEDICAL CARE IF:   You develop pain not relieved with medicines.   You develop numbness of your hand.   You develop bleeding from your surgical site.   You have an oral temperature above 102 F (38.9 C).   You develop redness or swelling of the surgical site.   You develop new, unexplained problems.  SEEK IMMEDIATE MEDICAL CARE IF:   You develop a rash.   You have difficulty breathing.   You develop any reaction or side effects to medicines given.  MAKE SURE YOU:    Understand these instructions.   Will watch your condition.   Will get help right away if you are not doing well or get worse.  Document Released: 11/29/2004 Document Revised: 05/01/2011 Document Reviewed: 03/18/2007 [facility] Patient Information 2012 [place]. Post Anesthesia Home Care Instructions  Activity: Get plenty of rest for the remainder of the day. A responsible adult should stay with you for 24 hours following the procedure.  For the next 24 hours, DO NOT: -Drive a car -Paediatric nurse -Drink alcoholic beverages -Take any medication unless instructed by your physician -Make any legal decisions or sign important papers.  Meals: Start with liquid foods such as gelatin or soup. Progress to regular foods as tolerated. Avoid greasy, spicy, heavy foods. If nausea and/or vomiting occur, drink only clear liquids until the nausea and/or vomiting subsides. Call your physician if vomiting continues.  Special Instructions/Symptoms: Your throat may feel dry or sore from the anesthesia or the breathing tube placed in your throat during surgery. If this causes discomfort, gargle with warm salt water. The discomfort should disappear within 24 hours.

## 2011-10-22 NOTE — Anesthesia Postprocedure Evaluation (Signed)
Anesthesia Post Note  Patient: [name]  Procedure(s) Performed: Procedure(s) (LRB): CARPAL TUNNEL RELEASE (Left)  Anesthesia type: General  Patient location: PACU  Post pain: Pain level controlled  Post assessment: Post-op Vital signs reviewed  Last Vitals:  Filed Vitals:   10/22/11 1125  BP: 153/78  Pulse:   Temp:   Resp:     Post vital signs: Reviewed  Level of consciousness: sedated  Complications: No apparent anesthesia complications

## 2011-10-22 NOTE — H&P (Signed)
[name] is an 78 y.o. female.   Chief Complaint:pain and numbness lt hand [id number] nerve conduction studies for median nerve compression at wrist  Past Medical History  Diagnosis Date  . Hypertension   . GERD (gastroesophageal reflux disease)   . Arthritis   . Slipped intervertebral disc   . Bilateral carpal tunnel syndrome   . Glaucoma   . Cataracts, both eyes   . History of stroke     mini stroke --  many yrs ago  . Diabetes mellitus ORAL MED  . Gout   . Diastolic heart failure CARDIOLOGIST- DR Daneen Schick---  LAST VISIT NOTE W/ CHART  . Heart murmur   . History of falling RECENT FALL 1 WK AGO--   NO INJURY  . Right leg weakness   . Walker as ambulation aid   . At risk for falling     Past Surgical History  Procedure Date  . Carpal tunnel release 09/17/2011    Procedure: CARPAL TUNNEL RELEASE;  Surgeon: Magnus Sinning, MD;  Location: [facility];  Service: Orthopedics;  Laterality: Right;  . Abdominal hysterectomy 1966  . Transthoracic echocardiogram 12-26-2008  DR Daneen Schick    NORMAL LVF/  EF  71%/   MILD ASYMMETRIC SEPTAL HYPERTROPHY/ MILD LEFT ATRIAL ENLARGEMENT/ MODERATELY ELEVATED ESTIMATED RIGHT VENTRICULAR SYSTOLIC PRESSURE/ MILD MITRAL  &  TRICUSPID VALVE REGURG.  . Cardiovascular stress test 06-18-2005   DR Daneen Schick    NORMAL STUDY/ NO EVIDENCE ISCHEMIA/ EF 75%    History reviewed. No pertinent family history. Social History:  reports that she has never smoked. She has never used smokeless tobacco. She reports that she does not drink alcohol or use illicit drugs.  Allergies: No Known Allergies  Medications Prior to Admission  Medication Sig Dispense Refill  . allopurinol (ZYLOPRIM) 300 MG tablet Take 300 mg by mouth daily.      Marland Kitchen amLODipine (NORVASC) 10 MG tablet Take 10 mg by mouth every morning.       Marland Kitchen aspirin 81 MG tablet Take 81 mg by mouth daily. Pt to stop asa on 10-17-2011      . b complex vitamins tablet Take 1 tablet  by mouth daily.      . Difluprednate (DUREZOL OP) Apply to eye 2 (two) times daily.      Marland Kitchen glipiZIDE (GLUCOTROL) 10 MG tablet Take 10 mg by mouth 2 (two) times daily before a meal.      . hydrALAZINE (APRESOLINE) 10 MG tablet Take 10 mg by mouth 3 (three) times daily.      Marland Kitchen HYDROcodone-acetaminophen (NORCO) 5-325 MG per tablet Take 1 tablet by mouth every 6 (six) hours as needed.      Marland Kitchen lisinopril (PRINIVIL,ZESTRIL) 40 MG tablet Take 40 mg by mouth every morning.       . loteprednol (LOTEMAX) 0.5 % ophthalmic suspension Place 1 drop into the left eye 2 (two) times daily.      . meloxicam (MOBIC) 7.5 MG tablet Take 15 mg by mouth daily.      . metoprolol (LOPRESSOR) 50 MG tablet Take 50 mg by mouth 2 (two) times daily.      . pravastatin (PRAVACHOL) 20 MG tablet Take 20 mg by mouth daily.      . probenecid (BENEMID) 500 MG tablet Take 500 mg by mouth 2 (two) times daily.      . ranitidine (ZANTAC) 150 MG tablet Take 150 mg by mouth 2 (two) times daily.      Marland Kitchen  timolol (BETIMOL) 0.25 % ophthalmic solution Place 1 drop into both eyes 2 (two) times daily.        No results found for this or any previous visit (from the past 48 hour(s)). No results found.  ROS  Blood pressure 148/75, pulse 59, temperature 97.7 F (36.5 C), temperature source Oral, resp. rate 18, height 5\' 4"  (1.626 m), weight 85.73 kg (189 lb), SpO2 98.00%. Physical Exam  Constitutional: She is oriented to person, place, and time. She appears well-developed and well-nourished.  HENT:  Head: Normocephalic and atraumatic.  Right Ear: External ear normal.  Left Ear: External ear normal.  Nose: Nose normal.  Mouth/Throat: Oropharynx is clear and moist.  Eyes: Conjunctivae and EOM are normal. Pupils are equal, round, and reactive to light.  Neck: Normal range of motion. Neck supple.  Cardiovascular: Normal rate, regular rhythm, normal heart sounds and intact distal pulses.   Respiratory: Effort normal and breath sounds normal.   GI: Soft. Bowel sounds are normal.  Musculoskeletal: Normal range of motion.  Neurological: She is alert and oriented to person, place, and time. She has normal reflexes.  Skin: Skin is warm and dry.  Psychiatric: She has a normal mood and affect. Her behavior is normal. Judgment and thought content normal.     Assessment/Plan Lt carpal tunnel syndrome Lt carpal tunnel release  Kipling Graser P 10/22/2011, 9:34 AM

## 2011-10-23 ENCOUNTER — Encounter (HOSPITAL_BASED_OUTPATIENT_CLINIC_OR_DEPARTMENT_OTHER): Payer: Self-pay | Admitting: Orthopedic Surgery

## 2011-10-23 NOTE — Op Note (Signed)
[name], [name]                ACCOUNT NO.:  [id number]  MEDICAL RECORD NO.:  [id number]  LOCATION:                               FACILITY:  [facility]  PHYSICIAN:  Tarri Glenn, M.D.  DATE OF BIRTH:  01/26/1924  DATE OF PROCEDURE:  10/22/2011 DATE OF DISCHARGE:                              OPERATIVE REPORT   PREOPERATIVE DIAGNOSIS:  Carpal tunnel syndrome, left upper extremity.  POSTOPERATIVE DIAGNOSIS:  Carpal tunnel syndrome, left upper extremity.  OPERATION:  Decompression median nerve, left wrist and hand.  SURGEON:  Tarri Glenn, M.D.  ASSISTANT:  Nurse.  ANESTHESIA:  General.  PATHOLOGY AND JUSTIFICATION FOR PROCEDURE:  I have recently performed right carpal tunnel release on her with an excellent result, and she was anxious at this time to have her left hand corrected.  She has good functional use of her right hand.  PROCEDURE:  Under satisfactory general anesthesia, pneumatic tourniquet with the left upper extremity with the arm Esmarched out nonsterilely and the tourniquet inflated to 250 mmHg.  The left arm was then prepped with DuraPrep from midforearm to fingertips, and draped in sterile field.  Time-out performed.  I made a curved incision along the base of thenar eminence, crossing obliquely over the flexor crease of the wrist and into the distal forearm.  Median nerve was identified at the wrist. It was quite swollen, almost a bulbous type appearance.  I then began releasing the skin, subcutaneous tissue, and fascia into the distal palm.  The major source of compression was in the mid palm where the nerve was flattened out and discolored.  When the decompression been completed, I irrigated the wound well with sterile saline, and closed the skin and subcutaneous tissue only with interrupted 4-0 nylon mattress sutures.  Betadine, Adaptic, dry sterile dressing, and a short- arm splint cast were applied.  She tolerated the procedure well.  At the time of  this dictation, she was on her way to recovery room in satisfactory condition with no known complications.          ______________________________ Tarri Glenn, M.D.     JA/MEDQ  D:  10/22/2011  T:  10/23/2011  Job:  [id number]

## 2012-01-02 ENCOUNTER — Encounter (HOSPITAL_COMMUNITY): Payer: Self-pay | Admitting: Pharmacy Technician

## 2012-01-02 ENCOUNTER — Other Ambulatory Visit: Payer: Self-pay | Admitting: Orthopedic Surgery

## 2012-01-12 ENCOUNTER — Ambulatory Visit (HOSPITAL_COMMUNITY)
Admission: RE | Admit: 2012-01-12 | Discharge: 2012-01-12 | Disposition: A | Discharge status: Home / Self Care | Payer: Medicare Other | Source: Ambulatory Visit | Attending: Orthopedic Surgery | Admitting: Orthopedic Surgery

## 2012-01-12 ENCOUNTER — Encounter (HOSPITAL_COMMUNITY)
Admission: RE | Admit: 2012-01-12 | Discharge: 2012-01-12 | Disposition: A | Discharge status: Home / Self Care | Payer: Medicare Other | Source: Ambulatory Visit | Attending: Orthopedic Surgery | Admitting: Orthopedic Surgery

## 2012-01-12 ENCOUNTER — Encounter (HOSPITAL_COMMUNITY): Payer: Self-pay

## 2012-01-12 DIAGNOSIS — J9819 Other pulmonary collapse: Secondary | ICD-10-CM | POA: Insufficient documentation

## 2012-01-12 DIAGNOSIS — I10 Essential (primary) hypertension: Secondary | ICD-10-CM | POA: Insufficient documentation

## 2012-01-12 DIAGNOSIS — Z01812 Encounter for preprocedural laboratory examination: Secondary | ICD-10-CM | POA: Insufficient documentation

## 2012-01-12 DIAGNOSIS — Z01818 Encounter for other preprocedural examination: Secondary | ICD-10-CM | POA: Insufficient documentation

## 2012-01-12 HISTORY — DX: Type 2 diabetes mellitus with diabetic neuropathy, unspecified: E11.40

## 2012-01-12 LAB — SURGICAL PCR SCREEN
MRSA, PCR: NEGATIVE
Staphylococcus aureus: NEGATIVE

## 2012-01-12 LAB — CBC
Platelets: 273 10*3/uL (ref 150–400)
RDW: 14.5 % (ref 11.5–15.5)
WBC: 8.4 10*3/uL (ref 4.0–10.5)

## 2012-01-12 LAB — BASIC METABOLIC PANEL
Chloride: 105 mEq/L (ref 96–112)
Creatinine, Ser: 1.1 mg/dL (ref 0.50–1.10)
GFR calc Af Amer: 51 mL/min — ABNORMAL LOW (ref >90)
Potassium: 4.1 mEq/L (ref 3.5–5.1)
Sodium: 141 mEq/L (ref 135–145)

## 2012-01-12 NOTE — Pre-Procedure Instructions (Signed)
Pre op instructions given using the teach back method

## 2012-01-12 NOTE — Pre-Procedure Instructions (Signed)
ekg 01-01-2012 dr Tamala Julian on chart lov note dr Tamala Julian cardiology 01-01-2012 on chart Medical clearance note dr hill on chart

## 2012-01-12 NOTE — Patient Instructions (Addendum)
[place]  01/12/2012   Your procedure is scheduled on: 01-14-2012   Report to [place] at  1100 AM.  Call this number if you have problems the morning of surgery: [phone]   Remember:   Do not eat food :After Midnight.   clear liquids midnight until 0730 day of surgery, then nothing by mouth.  Take these medicines the morning of surgery with A SIP OF WATER: amlodipine,timolol eye drop, hydrocodone if needed, zantac   Do not wear jewelry or make up.  Do not wear lotions, powders, or perfumes.Do not wear deodorant.    Do not bring valuables to the hospital.  Contacts, dentures or bridgework may not be worn into surgery.  Leave suitcase in the car. After surgery it may be brought to your room.  For patients admitted to the hospital, checkout time is 11:00 AM the day    discharge                             Special Instructions: CHG Shower Use Special Wash: 1/2 bottle night before surgery and 1/2 bottle morning of surgery, use regular soap on face and front and back private area.   Please read over the following fact sheets that you were given: MRSA [facility] pre op nurse phone number [phone], call if needed

## 2012-01-14 ENCOUNTER — Inpatient Hospital Stay (HOSPITAL_COMMUNITY): Payer: Medicare Other

## 2012-01-14 ENCOUNTER — Inpatient Hospital Stay (HOSPITAL_COMMUNITY): Payer: Medicare Other | Admitting: Anesthesiology

## 2012-01-14 ENCOUNTER — Encounter (HOSPITAL_COMMUNITY): Payer: Self-pay | Admitting: Anesthesiology

## 2012-01-14 ENCOUNTER — Encounter (HOSPITAL_COMMUNITY)
Admission: RE | Disposition: A | Discharge status: Home Health | Payer: Self-pay | Source: Ambulatory Visit | Attending: Orthopedic Surgery

## 2012-01-14 ENCOUNTER — Inpatient Hospital Stay (HOSPITAL_COMMUNITY)
Admission: RE | Admit: 2012-01-14 | Discharge: 2012-01-16 | DRG: 491 | Disposition: A | Discharge status: Home Health | Payer: Medicare Other | Source: Ambulatory Visit | Attending: Orthopedic Surgery | Admitting: Orthopedic Surgery

## 2012-01-14 ENCOUNTER — Encounter (HOSPITAL_COMMUNITY): Payer: Self-pay | Admitting: *Deleted

## 2012-01-14 DIAGNOSIS — Z79899 Other long term (current) drug therapy: Secondary | ICD-10-CM

## 2012-01-14 DIAGNOSIS — M48061 Spinal stenosis, lumbar region without neurogenic claudication: Principal | ICD-10-CM | POA: Diagnosis present

## 2012-01-14 DIAGNOSIS — Z9889 Other specified postprocedural states: Secondary | ICD-10-CM

## 2012-01-14 DIAGNOSIS — I10 Essential (primary) hypertension: Secondary | ICD-10-CM | POA: Diagnosis present

## 2012-01-14 DIAGNOSIS — E1149 Type 2 diabetes mellitus with other diabetic neurological complication: Secondary | ICD-10-CM | POA: Diagnosis present

## 2012-01-14 DIAGNOSIS — M109 Gout, unspecified: Secondary | ICD-10-CM | POA: Diagnosis present

## 2012-01-14 DIAGNOSIS — Z8673 Personal history of transient ischemic attack (TIA), and cerebral infarction without residual deficits: Secondary | ICD-10-CM

## 2012-01-14 DIAGNOSIS — E1142 Type 2 diabetes mellitus with diabetic polyneuropathy: Secondary | ICD-10-CM | POA: Diagnosis present

## 2012-01-14 DIAGNOSIS — K219 Gastro-esophageal reflux disease without esophagitis: Secondary | ICD-10-CM | POA: Diagnosis present

## 2012-01-14 HISTORY — PX: LUMBAR LAMINECTOMY/DECOMPRESSION MICRODISCECTOMY: SHX5026

## 2012-01-14 LAB — GLUCOSE, CAPILLARY
Glucose-Capillary: 97 mg/dL (ref 70–99)
Glucose-Capillary: 127 mg/dL — ABNORMAL HIGH (ref 70–99)
Glucose-Capillary: 145 mg/dL — ABNORMAL HIGH (ref 70–99)

## 2012-01-14 LAB — TYPE AND SCREEN: Antibody Screen: NEGATIVE

## 2012-01-14 SURGERY — LUMBAR LAMINECTOMY/DECOMPRESSION MICRODISCECTOMY 3 LEVELS
Anesthesia: General | Site: Back | Wound class: Clean

## 2012-01-14 MED ORDER — DEXAMETHASONE SODIUM PHOSPHATE 10 MG/ML IJ SOLN: INTRAMUSCULAR | Status: DC | PRN

## 2012-01-14 MED ORDER — TIMOLOL HEMIHYDRATE 0.25 % OP SOLN: 1.0000 [drp] | Freq: Two times a day (BID) | OPHTHALMIC | Status: DC

## 2012-01-14 MED ORDER — HYDRALAZINE HCL 10 MG PO TABS
10.0000 mg | ORAL_TABLET | Freq: Every day | ORAL | Status: DC
  Administered 2012-01-15 – 2012-01-16 (×2): 10 mg via ORAL
  Filled 2012-01-14 (×2): qty 1

## 2012-01-14 MED ORDER — GLYCOPYRROLATE 0.2 MG/ML IJ SOLN
INTRAMUSCULAR | Status: DC | PRN
  Administered 2012-01-14: 0.6 mg via INTRAVENOUS

## 2012-01-14 MED ORDER — SODIUM CHLORIDE 0.9 % IV SOLN: 250.0000 mL | INTRAVENOUS | Status: DC

## 2012-01-14 MED ORDER — GLIPIZIDE 10 MG PO TABS
10.0000 mg | ORAL_TABLET | Freq: Every day | ORAL | Status: DC
  Administered 2012-01-15 – 2012-01-16 (×2): 10 mg via ORAL
  Filled 2012-01-14 (×3): qty 1

## 2012-01-14 MED ORDER — CEFAZOLIN SODIUM-DEXTROSE 2-3 GM-% IV SOLR
2.0000 g | Freq: Three times a day (TID) | INTRAVENOUS | Status: AC
  Administered 2012-01-14 – 2012-01-15 (×2): 2 g via INTRAVENOUS
  Filled 2012-01-14 (×2): qty 50

## 2012-01-14 MED ORDER — DIPHENHYDRAMINE HCL 50 MG/ML IJ SOLN: 12.5000 mg | Freq: Four times a day (QID) | INTRAMUSCULAR | Status: DC | PRN

## 2012-01-14 MED ORDER — ONDANSETRON HCL 4 MG/2ML IJ SOLN
INTRAMUSCULAR | Status: DC | PRN
  Administered 2012-01-14: 4 mg via INTRAVENOUS

## 2012-01-14 MED ORDER — DEXTROSE IN LACTATED RINGERS 5 % IV SOLN: INTRAVENOUS | Status: DC

## 2012-01-14 MED ORDER — FENTANYL CITRATE 0.05 MG/ML IJ SOLN
INTRAMUSCULAR | Status: DC | PRN
  Administered 2012-01-14 (×4): 25 ug via INTRAVENOUS

## 2012-01-14 MED ORDER — 0.9 % SODIUM CHLORIDE (POUR BTL) OPTIME
TOPICAL | Status: DC | PRN
  Administered 2012-01-14: 1000 mL

## 2012-01-14 MED ORDER — B COMPLEX PO TABS: 1.0000 | ORAL_TABLET | Freq: Every day | ORAL | Status: DC

## 2012-01-14 MED ORDER — SIMVASTATIN 10 MG PO TABS
10.0000 mg | ORAL_TABLET | Freq: Every day | ORAL | Status: DC
  Administered 2012-01-14: 10 mg via ORAL
  Filled 2012-01-14 (×3): qty 1

## 2012-01-14 MED ORDER — SODIUM CHLORIDE 0.9 % IJ SOLN: 3.0000 mL | Freq: Two times a day (BID) | INTRAMUSCULAR | Status: DC

## 2012-01-14 MED ORDER — LISINOPRIL 40 MG PO TABS
40.0000 mg | ORAL_TABLET | Freq: Every morning | ORAL | Status: DC
  Administered 2012-01-15 – 2012-01-16 (×2): 40 mg via ORAL
  Filled 2012-01-14 (×2): qty 1

## 2012-01-14 MED ORDER — MORPHINE SULFATE (PF) 1 MG/ML IV SOLN
INTRAVENOUS | Status: AC
  Filled 2012-01-14: qty 25

## 2012-01-14 MED ORDER — SUCCINYLCHOLINE CHLORIDE 20 MG/ML IJ SOLN
INTRAMUSCULAR | Status: DC | PRN
  Administered 2012-01-14: 100 mg via INTRAVENOUS

## 2012-01-14 MED ORDER — DIPHENHYDRAMINE HCL 12.5 MG/5ML PO ELIX: 12.5000 mg | ORAL_SOLUTION | Freq: Four times a day (QID) | ORAL | Status: DC | PRN

## 2012-01-14 MED ORDER — ONDANSETRON HCL 4 MG/2ML IJ SOLN: 4.0000 mg | Freq: Four times a day (QID) | INTRAMUSCULAR | Status: DC | PRN

## 2012-01-14 MED ORDER — HYDROMORPHONE HCL PF 1 MG/ML IJ SOLN: 0.2500 mg | INTRAMUSCULAR | Status: DC | PRN

## 2012-01-14 MED ORDER — NALOXONE HCL 0.4 MG/ML IJ SOLN: 0.4000 mg | INTRAMUSCULAR | Status: DC | PRN

## 2012-01-14 MED ORDER — DEXTROSE-NACL 5-0.45 % IV SOLN
INTRAVENOUS | Status: DC
  Administered 2012-01-14 – 2012-01-15 (×3): via INTRAVENOUS

## 2012-01-14 MED ORDER — ALLOPURINOL 300 MG PO TABS
300.0000 mg | ORAL_TABLET | Freq: Every day | ORAL | Status: DC
  Administered 2012-01-15 – 2012-01-16 (×2): 300 mg via ORAL
  Filled 2012-01-14 (×2): qty 1

## 2012-01-14 MED ORDER — CEFAZOLIN SODIUM-DEXTROSE 2-3 GM-% IV SOLR
2.0000 g | Freq: Once | INTRAVENOUS | Status: AC
  Administered 2012-01-14: 2 g via INTRAVENOUS

## 2012-01-14 MED ORDER — POVIDONE-IODINE 7.5 % EX SOLN: Freq: Once | CUTANEOUS | Status: DC

## 2012-01-14 MED ORDER — METHOCARBAMOL 500 MG PO TABS: 500.0000 mg | ORAL_TABLET | Freq: Four times a day (QID) | ORAL | Status: DC | PRN

## 2012-01-14 MED ORDER — PROPOFOL 10 MG/ML IV EMUL
INTRAVENOUS | Status: DC | PRN
  Administered 2012-01-14: 130 mg via INTRAVENOUS

## 2012-01-14 MED ORDER — MENTHOL 3 MG MT LOZG
1.0000 | LOZENGE | OROMUCOSAL | Status: DC | PRN
  Filled 2012-01-14: qty 9

## 2012-01-14 MED ORDER — PROBENECID 500 MG PO TABS
500.0000 mg | ORAL_TABLET | Freq: Two times a day (BID) | ORAL | Status: DC
  Administered 2012-01-14 – 2012-01-16 (×4): 500 mg via ORAL
  Filled 2012-01-14 (×5): qty 1

## 2012-01-14 MED ORDER — LIDOCAINE HCL (CARDIAC) 20 MG/ML IV SOLN
INTRAVENOUS | Status: DC | PRN
  Administered 2012-01-14: 70 mg via INTRAVENOUS

## 2012-01-14 MED ORDER — THROMBIN 5000 UNITS EX SOLR
CUTANEOUS | Status: DC | PRN
  Administered 2012-01-14: 14:00:00 via TOPICAL

## 2012-01-14 MED ORDER — AMLODIPINE BESYLATE 10 MG PO TABS
10.0000 mg | ORAL_TABLET | Freq: Every morning | ORAL | Status: DC
Start: 2012-01-15 — End: 2012-01-16
  Administered 2012-01-15 – 2012-01-16 (×2): 10 mg via ORAL
  Filled 2012-01-14 (×2): qty 1

## 2012-01-14 MED ORDER — OXYCODONE-ACETAMINOPHEN 5-325 MG PO TABS
1.0000 | ORAL_TABLET | ORAL | Status: DC | PRN
  Administered 2012-01-16: 1 via ORAL
  Filled 2012-01-14: qty 1

## 2012-01-14 MED ORDER — PROMETHAZINE HCL 25 MG/ML IJ SOLN: 6.2500 mg | INTRAMUSCULAR | Status: DC | PRN

## 2012-01-14 MED ORDER — SODIUM CHLORIDE 0.9 % IJ SOLN: 9.0000 mL | INTRAMUSCULAR | Status: DC | PRN

## 2012-01-14 MED ORDER — TIMOLOL MALEATE 0.25 % OP SOLN
1.0000 [drp] | Freq: Two times a day (BID) | OPHTHALMIC | Status: DC
  Administered 2012-01-14 – 2012-01-16 (×4): 1 [drp] via OPHTHALMIC
  Filled 2012-01-14 (×2): qty 5

## 2012-01-14 MED ORDER — EPHEDRINE SULFATE 50 MG/ML IJ SOLN
INTRAMUSCULAR | Status: DC | PRN
  Administered 2012-01-14: 5 mg via INTRAVENOUS
  Administered 2012-01-14: 10 mg via INTRAVENOUS
  Administered 2012-01-14: 5 mg via INTRAVENOUS

## 2012-01-14 MED ORDER — MORPHINE SULFATE (PF) 1 MG/ML IV SOLN
INTRAVENOUS | Status: DC
  Administered 2012-01-14: 16:00:00 via INTRAVENOUS
  Administered 2012-01-15: 2 mg via INTRAVENOUS
  Administered 2012-01-15: 1 mg via INTRAVENOUS
  Administered 2012-01-15: 1.99 mg via INTRAVENOUS
  Administered 2012-01-16: 1 mg via INTRAVENOUS

## 2012-01-14 MED ORDER — LACTATED RINGERS IV SOLN
INTRAVENOUS | Status: DC
  Administered 2012-01-14: 1000 mL via INTRAVENOUS

## 2012-01-14 MED ORDER — NEOSTIGMINE METHYLSULFATE 1 MG/ML IJ SOLN
INTRAMUSCULAR | Status: DC | PRN
  Administered 2012-01-14: 5 mg via INTRAVENOUS

## 2012-01-14 MED ORDER — ROCURONIUM BROMIDE 100 MG/10ML IV SOLN
INTRAVENOUS | Status: DC | PRN
  Administered 2012-01-14: 50 mg via INTRAVENOUS

## 2012-01-14 MED ORDER — PHENOL 1.4 % MT LIQD
1.0000 | OROMUCOSAL | Status: DC | PRN
  Filled 2012-01-14: qty 177

## 2012-01-14 MED ORDER — B COMPLEX-C PO TABS
1.0000 | ORAL_TABLET | Freq: Every day | ORAL | Status: DC
  Administered 2012-01-15 – 2012-01-16 (×2): 1 via ORAL
  Filled 2012-01-14 (×2): qty 1

## 2012-01-14 MED ORDER — METHOCARBAMOL 100 MG/ML IJ SOLN
500.0000 mg | Freq: Four times a day (QID) | INTRAVENOUS | Status: DC | PRN
  Administered 2012-01-14: 500 mg via INTRAVENOUS
  Filled 2012-01-14: qty 5

## 2012-01-14 SURGICAL SUPPLY — 40 items
APL SKNCLS STERI-STRIP NONHPOA (GAUZE/BANDAGES/DRESSINGS) ×1
BAG SPEC THK2 15X12 ZIP CLS (MISCELLANEOUS) ×1
BAG ZIPLOCK 12X15 (MISCELLANEOUS) ×2 IMPLANT
BENZOIN TINCTURE PRP APPL 2/3 (GAUZE/BANDAGES/DRESSINGS) ×2 IMPLANT
CLEANER TIP ELECTROSURG 2X2 (MISCELLANEOUS) ×2 IMPLANT
CLOTH BEACON ORANGE TIMEOUT ST (SAFETY) ×2 IMPLANT
CONT SPEC 4OZ CLIKSEAL STRL BL (MISCELLANEOUS) ×2 IMPLANT
DRAIN PENROSE 18X1/4 LTX STRL (WOUND CARE) IMPLANT
DRAPE MICROSCOPE LEICA (MISCELLANEOUS) ×2 IMPLANT
DRAPE POUCH INSTRU U-SHP 10X18 (DRAPES) ×2 IMPLANT
DRAPE SURG 17X11 SM STRL (DRAPES) ×2 IMPLANT
DRSG ADAPTIC 3X8 NADH LF (GAUZE/BANDAGES/DRESSINGS) ×2 IMPLANT
DRSG PAD ABDOMINAL 8X10 ST (GAUZE/BANDAGES/DRESSINGS) ×2 IMPLANT
DURAPREP 26ML APPLICATOR (WOUND CARE) ×2 IMPLANT
ELECT BLADE TIP CTD 4 INCH (ELECTRODE) ×2 IMPLANT
ELECT REM PT RETURN 9FT ADLT (ELECTROSURGICAL) ×2
ELECTRODE REM PT RTRN 9FT ADLT (ELECTROSURGICAL) ×1 IMPLANT
GLOVE ECLIPSE 8.0 STRL XLNG CF (GLOVE) ×3 IMPLANT
GLOVE INDICATOR 8.0 STRL GRN (GLOVE) ×4 IMPLANT
GOWN STRL REIN XL XLG (GOWN DISPOSABLE) ×3 IMPLANT
KIT BASIN OR (CUSTOM PROCEDURE TRAY) ×2 IMPLANT
KIT POSITIONING SURG ANDREWS (MISCELLANEOUS) ×2 IMPLANT
MANIFOLD NEPTUNE II (INSTRUMENTS) ×2 IMPLANT
NDL SPNL 18GX3.5 QUINCKE PK (NEEDLE) ×3 IMPLANT
NEEDLE SPNL 18GX3.5 QUINCKE PK (NEEDLE) ×6 IMPLANT
NS IRRIG 1000ML POUR BTL (IV SOLUTION) ×2 IMPLANT
PATTIES SURGICAL .5 X.5 (GAUZE/BANDAGES/DRESSINGS) ×1 IMPLANT
PATTIES SURGICAL .75X.75 (GAUZE/BANDAGES/DRESSINGS) IMPLANT
PATTIES SURGICAL 1X1 (DISPOSABLE) IMPLANT
SPONGE GAUZE 4X4 12PLY (GAUZE/BANDAGES/DRESSINGS) ×1 IMPLANT
SPONGE LAP 4X18 X RAY DECT (DISPOSABLE) ×2 IMPLANT
SPONGE SURGIFOAM ABS GEL 100 (HEMOSTASIS) ×2 IMPLANT
STAPLER VISISTAT 35W (STAPLE) ×1 IMPLANT
SUT VIC AB 1 CT1 27 (SUTURE) ×4
SUT VIC AB 1 CT1 27XBRD ANTBC (SUTURE) ×2 IMPLANT
SUT VIC AB 2-0 CT1 27 (SUTURE) ×4
SUT VIC AB 2-0 CT1 27XBRD (SUTURE) ×2 IMPLANT
TAPE CLOTH SURG 6X10 WHT LF (GAUZE/BANDAGES/DRESSINGS) ×1 IMPLANT
TOWEL OR 17X26 10 PK STRL BLUE (TOWEL DISPOSABLE) ×4 IMPLANT
TRAY LAMINECTOMY (CUSTOM PROCEDURE TRAY) ×2 IMPLANT

## 2012-01-14 NOTE — Anesthesia Postprocedure Evaluation (Signed)
  Anesthesia Post-op Note  Patient: [name]  Procedure(s) Performed: Procedure(s) (LRB): LUMBAR LAMINECTOMY/DECOMPRESSION MICRODISCECTOMY 3 LEVELS (N/A)  Patient Location: PACU  Anesthesia Type: General  Level of Consciousness: awake and alert   Airway and Oxygen Therapy: Patient Spontanous Breathing  Post-op Pain: mild  Post-op Assessment: Post-op Vital signs reviewed, Patient's Cardiovascular Status Stable, Respiratory Function Stable, Patent Airway and No signs of Nausea or vomiting  Post-op Vital Signs: stable  Complications: No apparent anesthesia complications

## 2012-01-14 NOTE — Transfer of Care (Signed)
Immediate Anesthesia Transfer of Care Note  Patient: [name]  Procedure(s) Performed: Procedure(s) (LRB): LUMBAR LAMINECTOMY/DECOMPRESSION MICRODISCECTOMY 3 LEVELS (N/A)  Patient Location: PACU  Anesthesia Type: General  Level of Consciousness: sedated, patient cooperative and responds to stimulaton  Airway & Oxygen Therapy: Patient Spontanous Breathing and Patient connected to face mask oxgen  Post-op Assessment: Report given to PACU RN and Post -op Vital signs reviewed and stable  Post vital signs: Reviewed and stable  Complications: No apparent anesthesia complications

## 2012-01-14 NOTE — Brief Op Note (Signed)
01/14/2012  3:22 PM  PATIENT:  [name]  78 y.o. female  PRE-OPERATIVE DIAGNOSIS:  Spinal Stenosis L2-L3  L3-L4  L4-L5 POST-OPERATIVE DIAGNOSIS:  spinal stenosis same  PROCEDURE:  Procedure(s) (LRB): LUMBAR LAMINECTOMY/DECOMPRESSION MICRODISCECTOMY 3 LEVELS (N/A)  SURGEON:  Surgeon(s) and Role:    * Magnus Sinning, MD - Primary    * Tobi Bastos, MD - Assisting  PHYSICIAN ASSISTANT:   ASSISTANTS:nurse  ANESTHESIA:   general  EBL:  Total I/O In: 1300 [I.V.:1300] Out: 200 [Urine:150; Blood:50]  BLOOD ADMINISTERED:none  DRAINS: none   LOCAL MEDICATIONS USED:  NONE  SPECIMEN:  No Specimen  DISPOSITION OF SPECIMEN:  N/A  COUNTS:  YES  TOURNIQUET:  * No tourniquets in log *  DICTATION: .Other Dictation: Dictation Number [id number]  PLAN OF CARE: Admit for overnight observation  PATIENT DISPOSITION:  PACU - hemodynamically stable.   Delay start of Pharmacological VTE agent (>24hrs) due to surgical blood loss or risk of bleeding: yes

## 2012-01-14 NOTE — Anesthesia Preprocedure Evaluation (Addendum)
Anesthesia Evaluation  Patient identified by MRN, date of birth, ID band Patient awake  General Assessment Comment:Fluid restrict due to mild diastolic dysfxn  Reviewed: Allergy & Precautions, H&P , NPO status , Patient's Chart, lab work & pertinent test results  Airway Mallampati: II TM Distance: <3 FB Neck ROM: Limited    Dental No notable dental hx. (+) Edentulous Upper and Dental Advisory Given   Pulmonary neg pulmonary ROS,  breath sounds clear to auscultation  Pulmonary exam normal       Cardiovascular hypertension, Pt. on medications Rhythm:Regular Rate:Normal     Neuro/Psych negative neurological ROS  negative psych ROS   GI/Hepatic Neg liver ROS, GERD-  Medicated,  Endo/Other  Type 2  Renal/GU negative Renal ROS  negative genitourinary   Musculoskeletal negative musculoskeletal ROS (+)   Abdominal   Peds negative pediatric ROS (+)  Hematology negative hematology ROS (+)   Anesthesia Other Findings   Reproductive/Obstetrics negative OB ROS                          Anesthesia Physical Anesthesia Plan  ASA: II  Anesthesia Plan: General   Post-op Pain Management:    Induction: Intravenous  Airway Management Planned: Oral ETT  Additional Equipment:   Intra-op Plan:   Post-operative Plan: Extubation in OR  Informed Consent: I have reviewed the patients History and Physical, chart, labs and discussed the procedure including the risks, benefits and alternatives for the proposed anesthesia with the patient or authorized representative who has indicated his/her understanding and acceptance.   Dental advisory given  Plan Discussed with: CRNA and Surgeon  Anesthesia Plan Comments:         Anesthesia Quick Evaluation

## 2012-01-14 NOTE — H&P (Signed)
[name] is an 78 y.o. female.   Chief Complaint:low back and rt leg pain **HPlumbar myeloogram demonstrates spinal stenosis L2-3 L3-4 L4-5  Past Medical History  Diagnosis Date  . Hypertension   . GERD (gastroesophageal reflux disease)   . Arthritis   . Slipped intervertebral disc   . Bilateral carpal tunnel syndrome   . Glaucoma   . Cataracts, both eyes   . 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 Diabetes mellitus ORAL MED  . Gout   . Diastolic heart failure CARDIOLOGIST- DR Daneen Schick---  LAST VISIT NOTE W/ CHART  . Heart murmur   . History of falling RECENT FALL 1 WK AGO--   NO INJURY  . Right leg weakness   . Walker as ambulation aid   . At risk for falling   . Diabetic neuropathy both hands and legs    Past Surgical History  Procedure Date  . Carpal tunnel release 09/17/2011    Procedure: CARPAL TUNNEL RELEASE;  Surgeon: Magnus Sinning, MD;  Location: [facility];  Service: Orthopedics;  Laterality: Right;  . Abdominal hysterectomy 1966  . Transthoracic echocardiogram 12-26-2008  DR Daneen Schick    NORMAL LVF/  EF  71%/   MILD ASYMMETRIC SEPTAL HYPERTROPHY/ MILD LEFT ATRIAL ENLARGEMENT/ MODERATELY ELEVATED ESTIMATED RIGHT VENTRICULAR SYSTOLIC PRESSURE/ MILD MITRAL  &  TRICUSPID VALVE REGURG.  . Cardiovascular stress test 06-18-2005   DR Daneen Schick    NORMAL STUDY/ NO EVIDENCE ISCHEMIA/ EF 75%  . Carpal tunnel release 10/22/2011    Procedure: CARPAL TUNNEL RELEASE;  Surgeon: Magnus Sinning, MD;  Location: [facility];  Service: Orthopedics;  Laterality: Left;    History reviewed. No pertinent family history. Social History:  reports that she has never smoked. She has never used smokeless tobacco. She reports that she does not drink alcohol or use illicit drugs.  Allergies: No Known Allergies  Medications Prior to Admission  Medication Sig Dispense Refill  . allopurinol (ZYLOPRIM) 300 MG tablet Take 300 mg  by mouth daily with breakfast.       . amLODipine (NORVASC) 10 MG tablet Take 10 mg by mouth every morning.       Marland Kitchen aspirin 81 MG tablet Take 81 mg by mouth daily.       Marland Kitchen b complex vitamins tablet Take 1 tablet by mouth daily.      Marland Kitchen glipiZIDE (GLUCOTROL) 10 MG tablet Take 10 mg by mouth daily.       . hydrALAZINE (APRESOLINE) 10 MG tablet Take 10 mg by mouth daily with breakfast.       . meloxicam (MOBIC) 7.5 MG tablet Take 15 mg by mouth daily.      . pravastatin (PRAVACHOL) 20 MG tablet Take 20 mg by mouth every evening.       . probenecid (BENEMID) 500 MG tablet Take 500 mg by mouth 2 (two) times daily.      . ranitidine (ZANTAC) 150 MG tablet Take 150 mg by mouth daily.       . timolol (BETIMOL) 0.25 % ophthalmic solution Place 1 drop into both eyes 2 (two) times daily.      Marland Kitchen HYDROcodone-acetaminophen (NORCO) 5-325 MG per tablet Take 1 tablet by mouth every 6 (six) hours as needed. pain      . lisinopril (PRINIVIL,ZESTRIL) 40 MG tablet Take 40 mg by mouth every morning.       Marland Kitchen  loteprednol (LOTEMAX) 0.5 % ophthalmic suspension Place 1 drop into the left eye 2 (two) times daily.        Results for orders placed during the hospital encounter of 01/14/12 (from the past 48 hour(s))  GLUCOSE, CAPILLARY     Status: Normal   Collection Time   01/14/12 11:32 AM      Component Value Range Comment   Glucose-Capillary 97  70 - 99 mg/dL    No results found.  ROS  Blood pressure 168/67, pulse 57, temperature 98 F (36.7 C), resp. rate 20, SpO2 99.00%. Physical Exam  Constitutional: She is oriented to person, place, and time. She appears well-developed and well-nourished.  HENT:  Head: Normocephalic and atraumatic.  Right Ear: External ear normal.  Left Ear: External ear normal.  Nose: Nose normal.  Mouth/Throat: Oropharynx is clear and moist.  Eyes: Conjunctivae and EOM are normal. Pupils are equal, round, and reactive to light.  Neck: Normal range of motion. Neck supple.    Cardiovascular: Normal rate, regular rhythm, normal heart sounds and intact distal pulses.   Respiratory: Effort normal and breath sounds normal.  GI: Soft. Bowel sounds are normal.  Musculoskeletal: Normal range of motion.  Neurological: She is alert and oriented to person, place, and time. She has normal reflexes.  Skin: Skin is warm and dry.  Psychiatric: She has a normal mood and affect. Her behavior is normal. Judgment and thought content normal.     Assessment/Plan Spinal stenosis L2-3 L3-4 L4-5 Decompressive laminectomy L2-L5  Ambert Virrueta P 01/14/2012, 12:29 PM

## 2012-01-15 ENCOUNTER — Encounter (HOSPITAL_COMMUNITY): Payer: Self-pay | Admitting: Orthopedic Surgery

## 2012-01-15 LAB — GLUCOSE, CAPILLARY: Glucose-Capillary: 156 mg/dL — ABNORMAL HIGH (ref 70–99)

## 2012-01-15 MED FILL — Thrombin For Soln 5000 Unit: CUTANEOUS | Qty: 5000 | Status: AC

## 2012-01-15 NOTE — Progress Notes (Signed)
Patient ID: [name], female   DOB: 1924/03/21, 78 y.o.   MRN: [id number] POD 1--looks and feels well.  Pre-op pain gone.  No numbness or weakness in legs.  Plan to remove foley and have PT see today;home tomorrow.

## 2012-01-15 NOTE — Op Note (Signed)
[name], [name]                ACCOUNT NO.:  [id number]  MEDICAL RECORD NO.:  [id number]  LOCATION:  29                         FACILITY:  [facility]  PHYSICIAN:  Tarri Glenn, M.D.  DATE OF BIRTH:  03-05-1924  DATE OF PROCEDURE:  01/14/2012 DATE OF DISCHARGE:                              OPERATIVE REPORT   PREOPERATIVE DIAGNOSIS:  Spinal stenosis at L2-L3, L3-L4, and L4-L5.  POSTOPERATIVE DIAGNOSIS:  Spinal stenosis at L2-L3, L3-L4, and L4-L5.  OPERATION:  Central and lateral recess decompression at L2-L3, L3-L4 and L4-L5.  SURGEON:  Tarri Glenn, M.D.  ASSISTANT:  Kipp Brood. Gladstone Lighter, M.D.  ANESTHESIA:  General.  PATHOLOGY AND JUSTIFICATION FOR PROCEDURE:  She was having back and primarily right leg pain.  Myelogram CT scan demonstrated the above abnormalities.  PROCEDURE IN DETAIL:  She was cleared for surgery medically, prophylactic antibiotics, satisfied general anesthesia.  Foley catheter inserted.  Prone position on rolls.  Back was prepped with DuraPrep, draped in sterile field with 3 needles and a lateral x-ray.  I tentatively located the operative area first.  I then completed draping her back in sterile field with Ioban employed.  Time-out performed. Vertical midline incision down to the area that we had preoperatively located on x-ray.  I removed soft tissue from the lamina of L2, L3, and L4 and took a second lateral x-ray with Kocher clamps, indicating we need to go up to a little more cephalad on exposure.  We then continued with the exposure and placed a self-retaining McCullough retractor and a cerebellar retractor for exposure.  With double-action rongeur, I then began removing bone, tracking portion of the spinous process of L5 and portion of the spinous process of L2 and all the bone in between working down to the ligamentum flavum and bring in the microscope to complete the finer points of the decompression centrally and laterally.  At  the conclusion of the case, all foramina were patent to hockey-stick.  There was no dural injury.  There was minimal bleeding.  The wound was irrigated with sterile saline and final x-ray documented the levels of the compression, which was taken.  I then closed the wound over Gelfoam with interrupted #1 Vicryl in the paralumbar muscle and fascia, 2-0 Vicryl, subcutaneous tissue, and staples in the skin.  Betadine, Adaptic, dry sterile dressing were applied.  She was gently rolled under PACU bed and taken to recovery room in satisfied condition with no known complications.  Operative blood loss was less 200 mL.          ______________________________ Tarri Glenn, M.D.     JA/MEDQ  D:  01/14/2012  T:  01/15/2012  Job:  [id number]

## 2012-01-15 NOTE — Care Management Note (Signed)
    Page 1 of 2   01/16/2012     11:51:02 AM   CARE MANAGEMENT NOTE 01/16/2012  Patient:  [name], [name]   Account Number:  [id number]  Date Initiated:  01/15/2012  Documentation initiated by:  Sherrin Daisy  Subjective/Objective Assessment:   dx spinal stenosis; lumbar laminectomy/microdissectony 3 levels/decomoression of l2-3, l3-4, l4-5     Action/Plan:   Cm spoke with patient and daughters. Pt plans to return to her home in [place]. Daughter lives with patient and will be caregiver. Pt already has RW, w/c and BSC. Wants hh agency in network   Anticipated DC Date:  01/16/2012   Anticipated DC Plan:  [place] referral  Clinical Social Worker      DC Forensic scientist  CM consult      [facility] Choice  HOME HEALTH   Choice offered to / List presented to:  C-4 Adult Children   DME arranged  NA      DME agency  NA     [place] arranged  HH-2 PT      [place]   Status of service:  Completed, signed off Medicare Important Message given?  NA - LOS <3 / Initial given by admissions (If response is "NO", the following Medicare IM given date fields will be blank) Date Medicare IM given:   Date Additional Medicare IM given:    Discharge Disposition:  [place]  Per UR Regulation:  Reviewed for med. necessity/level of care/duration of stay  If discussed at [place] of Stay Meetings, dates discussed:    Comments:  01/16/2012 [place] Highland BSN CCM [phone] HHpt orders faxed to [facility]. Pt for discharge today  01/15/2012 Fredonia Highland BSN CCM [phone] [facility] care/can service patient for [place]. faxed op note, h&p, and face sheet to [phone]. They donot have ot availability. Start of services for Sat for HHPT 01/17/2012

## 2012-01-15 NOTE — Progress Notes (Signed)
Physical Therapy Treatment Patient Details Name: [name] MRN: [id number] DOB: 08/07/1923 Today's Date: 01/15/2012 Time: [id number] PT Time Calculation (min): 23 min  PT Assessment / Plan / Recommendation Comments on Treatment Session       Follow Up Recommendations  Home health PT    Barriers to Discharge        Equipment Recommendations  None recommended by OT    Recommendations for Other Services OT consult  Frequency 7X/week   Plan Discharge plan remains appropriate    Precautions / Restrictions Precautions Precautions: Back;Fall Precaution Booklet Issued: Yes (comment) Restrictions Weight Bearing Restrictions: No   Pertinent Vitals/Pain     Mobility  Bed Mobility Bed Mobility: Sit to Supine Supine to Sit: 3: Mod assist Sit to Supine: 3: Mod assist Details for Bed Mobility Assistance: ++ cues for sequence to roll into bed.  Pt demonstrating difficulty coordinating LE management with Upper body Transfers Transfers: Sit to Stand;Stand to Sit Sit to Stand: 4: Min assist;3: Mod assist Stand to Sit: 4: Min assist;3: Mod assist Details for Transfer Assistance: cues for use of UEs and adherence to back precautions Ambulation/Gait Ambulation/Gait Assistance: 4: Min assist Ambulation Distance (Feet): 200 Feet Assistive device: Rolling walker Ambulation/Gait Assistance Details: cues for posture and positiion from RW Gait Pattern: Step-through pattern    Exercises     PT Diagnosis: Difficulty walking  PT Problem List: Decreased mobility;Decreased balance;Decreased knowledge of use of DME;Decreased knowledge of precautions;Pain;Obesity;Decreased strength;Decreased activity tolerance PT Treatment Interventions: DME instruction;Gait training;Stair training;Functional mobility training;Therapeutic activities;Therapeutic exercise;Patient/family education   PT Goals Acute Rehab PT Goals PT Goal Formulation: With patient Time For Goal Achievement: 01/20/12 Potential  to Achieve Goals: Good Pt will go Supine/Side to Sit: with supervision PT Goal: Supine/Side to Sit - Progress: Goal set today Pt will go Sit to Supine/Side: with supervision PT Goal: Sit to Supine/Side - Progress: Goal set today Pt will go Sit to Stand: with supervision PT Goal: Sit to Stand - Progress: Progressing toward goal Pt will go Stand to Sit: with supervision PT Goal: Stand to Sit - Progress: Progressing toward goal Pt will Ambulate: 51 - 150 feet;with supervision;with rolling walker PT Goal: Ambulate - Progress: Progressing toward goal Pt will Go Up / Down Stairs: 3-5 stairs;with least restrictive assistive device;with min assist PT Goal: Up/Down Stairs - Progress: Goal set today  Visit Information  Last PT Received On: 01/15/12 Assistance Needed: +1    Subjective Data  Subjective: I'm ready to lay down Patient Stated Goal: Resume previous lifestyle with decreased pain and increased stability   Cognition  Overall Cognitive Status: Appears within functional limits for tasks assessed/performed Arousal/Alertness: Awake/alert Orientation Level: Appears intact for tasks assessed Behavior During Session: [facility] for tasks performed    Balance     End of Session PT - End of Session Activity Tolerance: Patient tolerated treatment well Patient left: in bed;with call bell/phone within reach;with family/visitor present Nurse Communication: Mobility status   GP     [name] 01/15/2012, 3:48 PM

## 2012-01-15 NOTE — Evaluation (Signed)
Occupational Therapy Evaluation Patient Details Name: [name] MRN: [id number] DOB: 1923-10-23 Today's Date: 01/15/2012 Time: [id number] OT Time Calculation (min): 26 min  OT Assessment / Plan / Recommendation Clinical Impression  Pt presents POD 1 for level 3 lumbar decomp/laminectomy. Skilled OT indicated to maximize independence with BADLs to min A level in prep for safe d/c home with [place] and assist from family.    OT Assessment  Patient needs continued OT Services    Follow Up Recommendations  Home health OT;Supervision/Assistance - 24 hour    Barriers to Discharge      Equipment Recommendations  None recommended by OT    Recommendations for Other Services    Frequency  Min 2X/week    Precautions / Restrictions Precautions Precautions: Back;Fall Precaution Booklet Issued: Yes (comment) Restrictions Weight Bearing Restrictions: No   Pertinent Vitals/Pain     ADL  Grooming: Simulated;Set up Where Assessed - Grooming: Unsupported sitting Upper Body Bathing: Simulated;Set up Where Assessed - Upper Body Bathing: Unsupported sitting Lower Body Bathing: Simulated;Maximal assistance Where Assessed - Lower Body Bathing: Supported sit to stand Upper Body Dressing: Simulated;Set up Where Assessed - Upper Body Dressing: Unsupported sitting Lower Body Dressing: Simulated;Maximal assistance Where Assessed - Lower Body Dressing: Supported sit to stand Toilet Transfer: Simulated;Moderate assistance Toilet Transfer Method: Sit to stand Toilet Transfer Equipment: Other (comment) (recliner) Toileting - Clothing Manipulation and Hygiene: Simulated;Moderate assistance Where Assessed - Toileting Clothing Manipulation and Hygiene: Standing Equipment Used: Rolling walker Transfers/Ambulation Related to ADLs: Pt ambulated into hallway with min A.  ADL Comments: Pt's daughters present and very attentive. Feel pt receives extensive ADL assist at baseline.    OT Diagnosis:  Generalized weakness  OT Problem List: Decreased activity tolerance;Decreased safety awareness;Decreased knowledge of use of DME or AE;Decreased knowledge of precautions OT Treatment Interventions: Self-care/ADL training;Therapeutic activities;DME and/or AE instruction;Patient/family education   OT Goals Acute Rehab OT Goals OT Goal Formulation: With patient/family Time For Goal Achievement: 01/22/12 Potential to Achieve Goals: Good ADL Goals Pt Will Perform Grooming: with supervision;Standing at sink ADL Goal: Grooming - Progress: Goal set today Pt Will Perform Lower Body Bathing: with mod assist;Sit to stand from chair;Sit to stand from bed;with adaptive equipment ADL Goal: Lower Body Bathing - Progress: Goal set today Pt Will Perform Lower Body Dressing: with mod assist;Sit to stand from bed;Sit to stand from chair ADL Goal: Lower Body Dressing - Progress: Goal set today Pt Will Transfer to Toilet: Other (comment);Maintaining back safety precautions;3-in-1;Ambulation;Stand pivot transfer (with minguard A) ADL Goal: Toilet Transfer - Progress: Goal set today Pt Will Perform Toileting - Clothing Manipulation: Other (comment);Standing (with minguard A) ADL Goal: Toileting - Clothing Manipulation - Progress: Goal set today Pt Will Perform Toileting - Hygiene: Other (comment);Sit to stand from 3-in-1/toilet (with minguard A) ADL Goal: Toileting - Hygiene - Progress: Goal set today Miscellaneous OT Goals Miscellaneous OT Goal #1: Pt will verbalize 3/3 back precautions and incorporate into selfcare activities with min VCs. OT Goal: Miscellaneous Goal #1 - Progress: Goal set today  Visit Information  Last OT Received On: 01/15/12 Assistance Needed: +1 PT/OT Co-Evaluation/Treatment: Yes    Subjective Data  Subjective: My daughter lives with me! Patient Stated Goal: Not asked   Prior Functioning  Vision/Perception  Home Living Lives With: Daughter Available Help at Discharge:  Family;Available 24 hours/day Type of Home: House Home Access: Stairs to enter [employer] of Steps: 3 Entrance Stairs-Rails: Right;Left Home Layout: One level Bathroom Shower/Tub: Chiropodist: Standard Home  Adaptive Equipment: Bedside commode/3-in-1;Walker - rolling;Shower chair with back Prior Function Level of Independence: Needs assistance Needs Assistance: Bathing;Dressing;Meal Prep;Light Housekeeping Bath: Other (comment) Dressing: Other (comment) Meal Prep: Total Light Housekeeping: Total Able to Take Stairs?: Yes Driving: No Vocation: Retired Corporate investment banker: No difficulties Dominant Hand: Right      Cognition  Overall Cognitive Status: Appears within functional limits for tasks assessed/performed Arousal/Alertness: Awake/alert Orientation Level: Appears intact for tasks assessed Behavior During Session: [facility] for tasks performed    Extremity/Trunk Assessment Right Upper Extremity Assessment RUE ROM/Strength/Tone: [facility] for tasks assessed Left Upper Extremity Assessment LUE ROM/Strength/Tone: WFL for tasks assessed Right Lower Extremity Assessment RLE ROM/Strength/Tone: [facility] for tasks assessed Left Lower Extremity Assessment LLE ROM/Strength/Tone: WFL for tasks assessed   Mobility Bed Mobility Bed Mobility: Supine to Sit Supine to Sit: 3: Mod assist Details for Bed Mobility Assistance: cues for correct log roll technique, adherence to back precautions and sequence to sitting uprigth Transfers Sit to Stand: 3: Mod assist Stand to Sit: 3: Mod assist Details for Transfer Assistance: cues for use of UEs and adherence to back precautions   Exercise    Balance    End of Session OT - End of Session Activity Tolerance: Patient tolerated treatment well Patient left: in chair;with call bell/phone within reach;with family/visitor present  [place] A OTR/L [phone] 01/15/2012, 2:05 PM

## 2012-01-15 NOTE — Progress Notes (Signed)
CSW consulted for SNF placement. PN reviewed. PT recommends HHPT following hospital d/c. Pt plans to return home.  RNCM will assist with d/c planning needs. CSW is available to assist if plan changes and SNF placement is needed.  Werner Lean LCSW [phone]

## 2012-01-15 NOTE — Progress Notes (Signed)
Physical Therapy Evaluation Patient Details Name: [name] MRN: [id number] DOB: 03/24/1924 Today's Date: 01/15/2012 Time: [id number] PT Time Calculation (min): 25 min  PT Assessment / Plan / Recommendation Clinical Impression  Pt presents with decreased functional mobiliyt 2* back pain and post-op back precautions    PT Assessment  Patient needs continued PT services    Follow Up Recommendations  Home health PT    Barriers to Discharge        Equipment Recommendations  None recommended by PT    Recommendations for Other Services OT consult   Frequency 7X/week    Precautions / Restrictions Precautions Precautions: Back;Fall Precaution Booklet Issued: Yes (comment) Restrictions Weight Bearing Restrictions: No   Pertinent Vitals/Pain       Mobility  Bed Mobility Bed Mobility: Supine to Sit Supine to Sit: 3: Mod assist Details for Bed Mobility Assistance: cues for correct log roll technique, adherence to back precautions and sequence to sitting uprigth Transfers Transfers: Sit to Stand;Stand to Sit Sit to Stand: 3: Mod assist Stand to Sit: 3: Mod assist Details for Transfer Assistance: cues for use of UEs and adherence to back precautions Ambulation/Gait Ambulation/Gait Assistance: 4: Min assist;3: Mod assist Ambulation Distance (Feet): 141 Feet Assistive device: Rolling walker Ambulation/Gait Assistance Details: cues for posture and positiion from RW Gait Pattern: Step-through pattern    Exercises     PT Diagnosis: Difficulty walking  PT Problem List: Decreased mobility;Decreased balance;Decreased knowledge of use of DME;Decreased knowledge of precautions;Pain;Obesity;Decreased strength;Decreased activity tolerance PT Treatment Interventions: DME instruction;Gait training;Stair training;Functional mobility training;Therapeutic activities;Therapeutic exercise;Patient/family education   PT Goals Acute Rehab PT Goals PT Goal Formulation: With patient Time  For Goal Achievement: 01/20/12 Potential to Achieve Goals: Good Pt will go Supine/Side to Sit: with supervision PT Goal: Supine/Side to Sit - Progress: Goal set today Pt will go Sit to Supine/Side: with supervision PT Goal: Sit to Supine/Side - Progress: Goal set today Pt will go Sit to Stand: with supervision PT Goal: Sit to Stand - Progress: Goal set today Pt will go Stand to Sit: with supervision PT Goal: Stand to Sit - Progress: Goal set today Pt will Ambulate: 51 - 150 feet;with supervision;with rolling walker PT Goal: Ambulate - Progress: Goal set today Pt will Go Up / Down Stairs: 3-5 stairs;with least restrictive assistive device;with min assist PT Goal: Up/Down Stairs - Progress: Goal set today  Visit Information  Last PT Received On: 01/15/12 Assistance Needed: +1    Subjective Data  Subjective: I was afraid it was going to hurt but it was not as bad as I expected Patient Stated Goal: Resume previous lifestyle with decreased pain and increased stability   Prior Functioning  Home Living Lives With: Daughter Available Help at Discharge: Family;Available 24 hours/day Type of Home: House Home Access: Stairs to enter [employer] of Steps: 3 Entrance Stairs-Rails: Right;Left Home Layout: One level Bathroom Shower/Tub: Chiropodist: Standard Home Adaptive Equipment: Bedside commode/3-in-1;Walker - rolling;Shower chair with back Prior Function Level of Independence: Needs assistance Needs Assistance: Bathing;Dressing;Meal Prep;Light Housekeeping Bath: Other (comment) Dressing: Other (comment) Meal Prep: Total Light Housekeeping: Total Able to Take Stairs?: Yes Driving: No Vocation: Retired Corporate investment banker: No difficulties Dominant Hand: Right    Cognition  Overall Cognitive Status: Appears within functional limits for tasks assessed/performed Arousal/Alertness: Awake/alert Orientation Level: Appears intact for tasks  assessed Behavior During Session: [facility] for tasks performed    Extremity/Trunk Assessment Right Upper Extremity Assessment RUE ROM/Strength/Tone: [facility] for tasks assessed Left  Upper Extremity Assessment LUE ROM/Strength/Tone: [facility] for tasks assessed Right Lower Extremity Assessment RLE ROM/Strength/Tone: [facility] for tasks assessed Left Lower Extremity Assessment LLE ROM/Strength/Tone: [facility] for tasks assessed   Balance    End of Session PT - End of Session Activity Tolerance: Patient tolerated treatment well Patient left: in chair;with call bell/phone within reach;with family/visitor present Nurse Communication: Mobility status  GP     [name] 01/15/2012, 1:05 PM

## 2012-01-15 NOTE — Plan of Care (Signed)
Problem: Diagnosis - Type of Surgery Goal: General Surgical Patient Education (See Patient Education module for education specifics)  Outcome: Completed/Met Date Met:  01/15/12 Lumbar Laminectomy w/ Decompression

## 2012-01-16 LAB — GLUCOSE, CAPILLARY: Glucose-Capillary: 166 mg/dL — ABNORMAL HIGH (ref 70–99)

## 2012-01-16 MED ORDER — METHOCARBAMOL 500 MG PO TABS: 500.0000 mg | ORAL_TABLET | Freq: Four times a day (QID) | ORAL | Status: AC

## 2012-01-16 MED ORDER — OXYCODONE-ACETAMINOPHEN 7.5-325 MG PO TABS: 1.0000 | ORAL_TABLET | ORAL | Status: AC | PRN

## 2012-01-16 NOTE — Progress Notes (Signed)
Physical Therapy Treatment Patient Details Name: [name] MRN: [id number] DOB: 1924-01-12 Today's Date: 01/16/2012 Time: [id number] PT Time Calculation (min): 26 min  PT Assessment / Plan / Recommendation Comments on Treatment Session  Marked improvement in bed transfers    Follow Up Recommendations  Home health PT    Barriers to Discharge        Equipment Recommendations  None recommended by OT    Recommendations for Other Services OT consult  Frequency 7X/week   Plan Discharge plan remains appropriate    Precautions / Restrictions Precautions Precautions: Back;Fall Precaution Booklet Issued: Yes (comment) Precaution Comments: pt recalls 2/3 back precautions without cues Restrictions Weight Bearing Restrictions: No   Pertinent Vitals/Pain Min c/o pain    Mobility  Bed Mobility Bed Mobility: Right Sidelying to Sit;Rolling Right Rolling Right: 4: Min assist Right Sidelying to Sit: 4: Min assist;HOB flat Sit to Supine: 4: Min assist Details for Bed Mobility Assistance: assist needed to elevate trunk to upright. Max cues to sequence and for hand placement. Transfers Transfers: Sit to Stand;Stand to Sit Sit to Stand: 4: Min assist;With upper extremity assist;From chair/3-in-1;From bed Stand to Sit: 4: Min assist;With upper extremity assist;With armrests;To chair/3-in-1 Details for Transfer Assistance: Physical A needed to rise and stabilize. max cues for hand placement and posture. Ambulation/Gait Ambulation/Gait Assistance: 4: Min assist Ambulation Distance (Feet): 145 Feet Assistive device: Rolling walker Ambulation/Gait Assistance Details: cues for position from RW, posture, and stride length Gait Pattern: Step-through pattern Stairs: Yes Stairs Assistance: 4: Min assist Stairs Assistance Details (indicate cue type and reason): cues for sequence and hand placement on rails Stair Management Technique: Two rails;Forwards;Step to pattern Number of Stairs: 2       Exercises     PT Diagnosis:    PT Problem List:   PT Treatment Interventions:     PT Goals Acute Rehab PT Goals PT Goal Formulation: With patient Time For Goal Achievement: 01/20/12 Potential to Achieve Goals: Good Pt will go Supine/Side to Sit: with supervision PT Goal: Supine/Side to Sit - Progress: Progressing toward goal Pt will go Sit to Supine/Side: with supervision PT Goal: Sit to Supine/Side - Progress: Progressing toward goal Pt will go Sit to Stand: with supervision PT Goal: Sit to Stand - Progress: Progressing toward goal Pt will go Stand to Sit: with supervision PT Goal: Stand to Sit - Progress: Progressing toward goal Pt will Ambulate: 51 - 150 feet;with supervision;with rolling walker PT Goal: Ambulate - Progress: Progressing toward goal Pt will Go Up / Down Stairs: 3-5 stairs;with least restrictive assistive device;with min assist PT Goal: Up/Down Stairs - Progress: Progressing toward goal  Visit Information  Last PT Received On: 01/16/12 Assistance Needed: +1    Subjective Data  Subjective: I'm going home today Patient Stated Goal: Resume previous lifestyle with decreased pain and increased stability   Cognition  Overall Cognitive Status: Appears within functional limits for tasks assessed/performed Arousal/Alertness: Awake/alert Orientation Level: Appears intact for tasks assessed Behavior During Session: [facility] for tasks performed    Balance     End of Session PT - End of Session Equipment Utilized During Treatment: Gait belt Activity Tolerance: Patient tolerated treatment well Patient left: in bed;with call bell/phone within reach;with family/visitor present Nurse Communication: Mobility status   GP     Marquisa Salih 01/16/2012, 12:05 PM

## 2012-01-16 NOTE — Progress Notes (Signed)
Occupational Therapy Treatment Patient Details Name: ARLEEN DICARLO MRN: [id number] DOB: Nov 11, 1923 Today's Date: 01/16/2012 Time: [id number] OT Time Calculation (min): 14 min  OT Assessment / Plan / Recommendation Comments on Treatment Session Pt making steady progress towards goals. Plan is for d/c home with family A today.    Follow Up Recommendations  Home health OT;Supervision/Assistance - 24 hour    Barriers to Discharge       Equipment Recommendations  None recommended by OT    Recommendations for Other Services    Frequency Min 2X/week   Plan Discharge plan remains appropriate    Precautions / Restrictions Precautions Precautions: Back;Fall   Pertinent Vitals/Pain Reported 5/10 back pain. Repositioned for comfort.    ADL  Grooming: Performed;Wash/dry hands;Minimal assistance Where Assessed - Grooming: Supported Personnel officer: Performed;Minimal Print production planner Method: Sit to Loss adjuster, chartered: Raised toilet seat with arms (or 3-in-1 over toilet) Toileting - Clothing Manipulation and Hygiene: Performed;Moderate assistance Where Assessed - Toileting Clothing Manipulation and Hygiene: Sit to stand from 3-in-1 or toilet Equipment Used: Rolling walker Transfers/Ambulation Related to ADLs: Pt ambulated to the bathroom with minguard A at a quicker pace than yesterday. Max cues for posture and to stay within confines of RW. ADL Comments: Pt was able to complete toilet hygeine but required A to pull pants up and down. Max cues to adhere to back precautions. Daughter stated she would be assisting pt with all of the bathing and dressing.    OT Diagnosis:    OT Problem List:   OT Treatment Interventions:     OT Goals ADL Goals ADL Goal: Grooming - Progress: Progressing toward goals ADL Goal: Toilet Transfer - Progress: Progressing toward goals ADL Goal: Toileting - Clothing Manipulation - Progress: Progressing toward goals ADL Goal:  Toileting - Hygiene - Progress: Progressing toward goals Miscellaneous OT Goals OT Goal: Miscellaneous Goal #1 - Progress: Progressing toward goals  Visit Information  Last OT Received On: 01/16/12 Assistance Needed: +1    Subjective Data  Subjective: I'd like to sit in the chair until its time to go.   Prior Functioning       Cognition  Overall Cognitive Status: Appears within functional limits for tasks assessed/performed Arousal/Alertness: Awake/alert Orientation Level: Appears intact for tasks assessed Behavior During Session: [facility] for tasks performed    Mobility Bed Mobility Bed Mobility: Right Sidelying to Sit;Rolling Right Rolling Right: 4: Min assist Right Sidelying to Sit: 4: Min assist;HOB flat Details for Bed Mobility Assistance: assist needed to elevate trunk to upright. Max cues to sequence and for hand placement. Transfers Sit to Stand: 4: Min assist;With upper extremity assist;From chair/3-in-1;From bed Stand to Sit: 4: Min assist;With upper extremity assist;With armrests;To chair/3-in-1 Details for Transfer Assistance: Physical A needed to rise and stabilize. max cues for hand placement and posture.   Exercises    Balance    End of Session OT - End of Session Activity Tolerance: Patient tolerated treatment well Patient left: in chair;with call bell/phone within reach;with family/visitor present  Sophia A OTR/L [id number] 01/16/2012, 11:20 AM

## 2012-01-16 NOTE — Progress Notes (Signed)
Pt to d/c home. AVS reviewed. Pt capable of verbalizing medications and follow-up appointments. Remains hemodynamically stable. No signs and symptoms of distress. Educated pt to return to ER in the case of SOB, dizziness, or chest pain.

## 2012-01-20 ENCOUNTER — Encounter (HOSPITAL_COMMUNITY): Payer: Self-pay

## 2012-01-22 NOTE — Discharge Summary (Signed)
[name], [name]                ACCOUNT NO.:  [id number]  MEDICAL RECORD NO.:  [id number]  LOCATION:  [id number]                         FACILITY:  [facility]  PHYSICIAN:  Tarri Glenn, M.D.  DATE OF BIRTH:  1923/08/05  DATE OF ADMISSION:  01/14/2012 DATE OF DISCHARGE:  01/16/2012                              DISCHARGE SUMMARY   ADMITTING DIAGNOSIS:  Spinal stenosis L2-3, L3-4, and L4-5.  DISCHARGE DIAGNOSIS:  Spinal stenosis L2-3, L3-4, and L4-5.  OPERATION:  Decompressive laminectomy, L2-3, L3-4, and L4-5 on January 14, 2012.  SUMMARY:  This is an 78 year old female, I have followed with progressive back and bilateral leg pain with a lumbar myelogram.  CT scan demonstrated significant spinal stenosis at the 3 levels L2-3, L3- 4, and L4-5.  Because of failure to respond to nonsurgical treatment and when she was medically cleared, she underwent the above-mentioned surgery without complication.  Postoperatively, she was seen by physical therapy and was ambulating satisfactorily with a walker at the time of discharge.  At discharge, her incision was healing nicely and she was afebrile.  Plan was to let her start showering as soon as the incision was dry and ambulate, activity allowed.  Plan was to see her back in 2 weeks from surgery.  HOME MEDICINES:  Norco 5-325 and Robaxin 500.  Condition on discharge was stable and improved.          ______________________________ Tarri Glenn, M.D.     JA/MEDQ  D:  01/21/2012  T:  01/22/2012  Job:  [id number]

## 2012-03-24 ENCOUNTER — Ambulatory Visit (HOSPITAL_COMMUNITY)
Admission: RE | Admit: 2012-03-24 | Discharge: 2012-03-24 | Disposition: A | Discharge status: Home / Self Care | Payer: Medicare Other | Source: Ambulatory Visit | Attending: Orthopedic Surgery | Admitting: Orthopedic Surgery

## 2012-03-24 DIAGNOSIS — M6281 Muscle weakness (generalized): Secondary | ICD-10-CM | POA: Insufficient documentation

## 2012-03-24 DIAGNOSIS — IMO0001 Reserved for inherently not codable concepts without codable children: Secondary | ICD-10-CM | POA: Insufficient documentation

## 2012-03-24 DIAGNOSIS — R262 Difficulty in walking, not elsewhere classified: Secondary | ICD-10-CM | POA: Insufficient documentation

## 2012-03-24 DIAGNOSIS — M25519 Pain in unspecified shoulder: Secondary | ICD-10-CM | POA: Insufficient documentation

## 2012-03-24 DIAGNOSIS — Z9181 History of falling: Secondary | ICD-10-CM | POA: Insufficient documentation

## 2012-03-24 DIAGNOSIS — I10 Essential (primary) hypertension: Secondary | ICD-10-CM | POA: Insufficient documentation

## 2012-03-24 NOTE — Evaluation (Addendum)
Physical Therapy Evaluation  Patient Details  Name: [name] MRN: [id number] Date of Birth: [birth year]  Today's Date: 03/24/2012 Time: 1102-1145 PT Time Calculation (min): 43 min Charges: 1 eval, 8' Te Visit#: 1  of 8   Re-eval: 04/23/12 Assessment  Diagnosis: Difficulty walking, generalized weakness s/p Lumbar surgery  Surgical Date: 01/14/12  Next MD Visit: Dr. Bari Mantis 19th.   Authorization: [employer]   Authorization Time Period:    Authorization Visit#: 1  of 10    Past Medical History:  Past Medical History  Diagnosis Date  . Hypertension   . GERD (gastroesophageal reflux disease)   . Arthritis   . Slipped intervertebral disc   . Bilateral carpal tunnel syndrome   . Glaucoma   . Cataracts, both eyes   . 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 Diabetes mellitus ORAL MED  . Gout   . Diastolic heart failure CARDIOLOGIST- DR Daneen Schick---  LAST VISIT NOTE W/ CHART  . Heart murmur   . History of falling RECENT FALL 1 WK AGO--   NO INJURY  . Right leg weakness   . Walker as ambulation aid   . At risk for falling   . Diabetic neuropathy both hands and legs   Past Surgical History:  Past Surgical History  Procedure Date  . Carpal tunnel release 09/17/2011    Procedure: CARPAL TUNNEL RELEASE;  Surgeon: Magnus Sinning, MD;  Location: [place];  Service: Orthopedics;  Laterality: Right;  . Abdominal hysterectomy 1966  . Transthoracic echocardiogram 12-26-2008  DR Daneen Schick    NORMAL LVF/  EF  71%/   MILD ASYMMETRIC SEPTAL HYPERTROPHY/ MILD LEFT ATRIAL ENLARGEMENT/ MODERATELY ELEVATED ESTIMATED RIGHT VENTRICULAR SYSTOLIC PRESSURE/ MILD MITRAL  &  TRICUSPID VALVE REGURG.  . Cardiovascular stress test 06-18-2005   DR Daneen Schick    NORMAL STUDY/ NO EVIDENCE ISCHEMIA/ EF 75%  . Carpal tunnel release 10/22/2011    Procedure: CARPAL TUNNEL RELEASE;  Surgeon: Magnus Sinning, MD;  Location: [place];  Service: Orthopedics;  Laterality: Left;  . Lumbar laminectomy/decompression microdiscectomy 01/14/2012    Procedure: LUMBAR LAMINECTOMY/DECOMPRESSION MICRODISCECTOMY 3 LEVELS;  Surgeon: Magnus Sinning, MD;  Location: [facility];  Service: Orthopedics;  Laterality: N/A;  Decompression Laminectomy of L2 - L3, L3 - L4 and L4 - L5 Central  (X-Ray)   Subjective Symptoms/Limitations Pertinent History: Pt is referred to PT secondary to back surgery on 01/14/12.  She stayed 2 days in the hospital.  She reports that she started to have balance problems back in March and has been using the RW since March.  She wishes she was able to lose the walker.  She is fearful of falling since she has fallen 3-4x since March (0x/since surgery).  She reports that she feels that her right leg is weak and give out.  She has some tenderness to her lumbar region.  Pt states she has not been able to go to church since March.  How long can you sit comfortably?: 60 minutes  How long can you stand comfortably?: 5 minutes How long can you walk comfortably?: 15 minutes with RW.   Patient Stated Goals: "I want to be able to go back to church." Pain Assessment Currently in Pain?: Yes Pain Score:   3 Pain Location: Back Pain Type: Acute pain;Surgical pain  Sensation/Coordination/Flexibility/Functional Tests Coordination Gross Motor Movements are Fluid and Coordinated: No Coordination and  Movement Description: impaired coordination to core musculature and R knee flexion (in prone position).  Functional Tests Functional Tests: 30 sec STS: 0x complete  Assessment RLE Strength Right Hip Flexion: 3/5 Right Hip Extension: 2/5 Right Hip ABduction: 3-/5 Right Knee Flexion: 3+/5 Right Knee Extension: 3+/5 LLE Strength Left Hip Flexion: 3+/5 Left Hip Extension: 2+/5 Left Hip ABduction: 3-/5 Left Knee Flexion: 4/5 Left Knee Extension: 4/5 Palpation Palpation: tenderness to her  incision  Exercise/Treatments Mobility/Balance  Ambulation/Gait Ambulation/Gait: Yes Ambulation/Gait Assistance: 6: Modified independent (Device/Increase time) Assistive device: Rolling walker Gait Pattern: Right foot flat;Left foot flat;Decreased stance time - right;Decreased hip/knee flexion - right;Decreased hip/knee flexion - left;Trendelenburg Stairs: Yes (Pt explains ascending and descending w/1 handrail step to) Posture/Postural Control Posture/Postural Control: Postural limitations Postural Limitations: slouched Static Standing Balance Static Standing - Comment/# of Minutes: each position held for a max of 10 seconds.  Single Leg Stance - Right Leg: 0  Single Leg Stance - Left Leg: 0  Tandem Stance - Right Leg: 0  Tandem Stance - Left Leg: 0  Rhomberg - Eyes Opened: 10  Rhomberg - Eyes Closed: 5  Berg Balance Test Sit to Stand: Able to stand using hands after several tries Standing Unsupported: Able to stand safely 2 minutes Sitting with Back Unsupported but Feet Supported on Floor or Stool: Able to sit safely and securely 2 minutes Stand to Sit: Controls descent by using hands Transfers: Able to transfer safely, definite need of hands Standing Unsupported with Eyes Closed: Able to stand 3 seconds Standing Ubsupported with Feet Together: Able to place feet together independently and stand for 1 minute with supervision From Standing, Reach Forward with Outstretched Arm: Can reach forward >5 cm safely (2") From Standing Position, Pick up Object from Floor: Able to pick up shoe, needs supervision From Standing Position, Turn to Look Behind Over each Shoulder: Looks behind one side only/other side shows less weight shift Turn 360 Degrees: Able to turn 360 degrees safely but slowly Standing Unsupported, Alternately Place Feet on Step/Stool: Needs assistance to keep from falling or unable to try Standing Unsupported, One Foot in Front: Able to take small step independently and  hold 30 seconds Standing on One Leg: Unable to try or needs assist to prevent fall Total Score: 33  Timed Up and Go Test TUG: Normal TUG Normal TUG (seconds): 17  (w/ RW)   Standing Knee Flexion: Both;10 reps;Limitations Knee Flexion Limitations: manual faciliation for R leg Functional Squat: 10 reps;Limitations Functional Squat Limitations: max VC and TC for posture Other Standing Knee Exercises: Hip ABD and Extension BLE x10x 2sec hold each w/AA for R hip form and mechanics   Physical Therapy Assessment and Plan PT Assessment and Plan Clinical Impression Statement: Pt is an 78 year old female referred to PT for difficulty walking w/back pain secondary to surgery. After examination it was found that she has significant balance and coordination deficits leaving her at a greater falls risk. Pt will benefit from skilled therapeutic intervention in order to improve on the following deficits: Abnormal gait;Decreased activity tolerance;Decreased coordination;Difficulty walking;Decreased strength;Pain;Improper body mechanics;Impaired perceived functional ability Rehab Potential: Good PT Frequency: Min 2X/week PT Duration: 6 weeks PT Treatment/Interventions: DME instruction;Gait training;Stair training;Functional mobility training;Therapeutic activities;Therapeutic exercise;Balance training;Neuromuscular re-education;Patient/family education;Manual techniques PT Plan: Focus on balance activities.  Start with low level activities: Rhomberg standing with eyes closed, add pertubations when able, progress to standing on step, stair training, functional squats, heel and toe raises, mat exercises as able.  Continue to  cue for posture and core training.     Goals Home Exercise Program Pt will Perform Home Exercise Program: Independently PT Goal: Perform Home Exercise Program - Progress: Goal set today PT Short Term Goals Time to Complete Short Term Goals: 2 weeks PT Short Term Goal 1: Pt will improve  her LE strength by 1 muscle grade. PT Short Term Goal 2: Pt will improve her static standing balance w/eyes closed x10 sec w/supervision. PT Short Term Goal 3: Pt will improve LE functional stregnth and demonstrate 1 STS from chair.  PT Short Term Goal 4: Pt will improve her dynamic gait and ambulate SPC in closed environment w/min A.  PT Long Term Goals Time to Complete Long Term Goals:  (6 weeks) PT Long Term Goal 1: Pt will improve her BERG to 42/56 for decreased falls risk. PT Long Term Goal 2: Pt will improve her TUG time to 13 sec w/LRAD in order improve safety with community ambulation. Long Term Goal 3: Pt will improve her ABC to 80% for improved percieved functional ability.  Long Term Goal 4: Pt wil improve her static and dynamic balance in order to ambulate with LRAD in outdoor and indoor surfaces in order to attend church.  PT Long Term Goal 5: Pt will ascend and descend 5 stairs w/1 handrail with step to pattern w/supervision in order to safely enter community dwellings and her home.   Problem List Patient Active Problem List  Diagnosis  . Muscle weakness (generalized)  . Difficulty in walking   GP  Functional Assessment Tool Used: ABC 46%  Functional Limitation: Mobility: Walking and moving around  Mobility: Walking and Moving Around Current Status [id number]): At least 40 percent but less than 60 percent impaired, limited or restricted  Mobility: Walking and Moving Around Goal Status [phone]): At least 1 percent but less than 20 percent impaired, limited or restricted  PT Plan of Care PT Home Exercise Plan: see scanned report.  Also educated and had pt demo core TrA, PF and Mutlifidus PT Patient Instructions: Educated on core activities, importance of posture.  Discussed Cx and NS policy.  Consulted and Agree with Plan of Care: Patient  Geoffery Lyons, PT 03/24/2012, 12:13 PM  Physician Documentation Your signature is required to indicate approval of the treatment plan as  stated above.  Please sign and either send electronically or make a copy of this report for your files and return this physician signed original.   Please mark one 1.__approve of plan  2. ___approve of plan with the following conditions.   ______________________________                                                          _____________________ Physician Signature  Date  

## 2012-03-24 NOTE — Evaluation (Signed)
Occupational Therapy Evaluation  Patient Details  Name: [name] MRN: [id number] Date of Birth: May 19, 1924  Today's Date: 03/24/2012 Time: 1020-1100 OT Time Calculation (min): 40 min OT Evaluation 1020-1040 20' Manual Therapy 1040-1100 20' Visit#: 1  of 12   Re-eval: 04/21/12     Authorization: [employer]  Authorization Time Period: before 10th visit   Authorization Visit#: 1  of 10    Past Medical History:  Past Medical History  Diagnosis Date  . Hypertension   . GERD (gastroesophageal reflux disease)   . Arthritis   . Slipped intervertebral disc   . Bilateral carpal tunnel syndrome   . Glaucoma   . Cataracts, both eyes   . 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 Diabetes mellitus ORAL MED  . Gout   . Diastolic heart failure CARDIOLOGIST- DR Daneen Schick---  LAST VISIT NOTE W/ CHART  . Heart murmur   . History of falling RECENT FALL 1 WK AGO--   NO INJURY  . Right leg weakness   . Walker as ambulation aid   . At risk for falling   . Diabetic neuropathy both hands and legs   Past Surgical History:  Past Surgical History  Procedure Date  . Carpal tunnel release 09/17/2011    Procedure: CARPAL TUNNEL RELEASE;  Surgeon: Magnus Sinning, MD;  Location: [place];  Service: Orthopedics;  Laterality: Right;  . Abdominal hysterectomy 1966  . Transthoracic echocardiogram 12-26-2008  DR Daneen Schick    NORMAL LVF/  EF  71%/   MILD ASYMMETRIC SEPTAL HYPERTROPHY/ MILD LEFT ATRIAL ENLARGEMENT/ MODERATELY ELEVATED ESTIMATED RIGHT VENTRICULAR SYSTOLIC PRESSURE/ MILD MITRAL  &  TRICUSPID VALVE REGURG.  . Cardiovascular stress test 06-18-2005   DR Daneen Schick    NORMAL STUDY/ NO EVIDENCE ISCHEMIA/ EF 75%  . Carpal tunnel release 10/22/2011    Procedure: CARPAL TUNNEL RELEASE;  Surgeon: Magnus Sinning, MD;  Location: [place];  Service: Orthopedics;  Laterality: Left;  . Lumbar laminectomy/decompression  microdiscectomy 01/14/2012    Procedure: LUMBAR LAMINECTOMY/DECOMPRESSION MICRODISCECTOMY 3 LEVELS;  Surgeon: Magnus Sinning, MD;  Location: [facility];  Service: Orthopedics;  Laterality: N/A;  Decompression Laminectomy of L2 - L3, L3 - L4 and L4 - L5 Central  (X-Ray)    Subjective S:  My shoulder has been hurting me for a few months.   Pertinent History: [name] reports left shoulder pain that has been increasing since August when she had back surgery.  She states that she was doing pretty well functionally until March.  In March, she began falling frequently and began using a walker.  She had bilateral carpal tunnel release surgery in June.  She had surgery in August for her back.  She had an xray and Dr. Shellia Carwin told her her left rotator cuff is completely gone.  She has been referred to occupational therapy for evaluation and treatment for her left shoulder. Special Tests: UEFI:  5/80 = 6% I level with use of her LUE with daily activities. Patient Stated Goals: "I want to get it feeling better." Pain Assessment Currently in Pain?: Yes Pain Score:   8 Pain Location: Shoulder Pain Orientation: Left;Anterior;Posterior  Precautions/Restrictions  Precautions Precautions: None   Assessment  03/24/12 1100  Assessment  Diagnosis Left Shoulder Pain  Surgical Date (August 2013 - back surgery, June 2013 - [facility])  Prior Therapy also referred to physical therapy for back pain  Precautions  Precautions None  Home Living  Lives With Daughter  Prior Function  Level of Independence Needs assistance with ADLs;Needs assistance with homemaking;Needs assistance with gait  Driving No  Vocation Retired  Leisure [employer] (Comment)  Comments enjoys watching TV, reading, working puzzles.    ADL  ADL Comments [name] requires assistance with dressing, bathing, grooming.  She enjoys cleaning and cooking, but cannot due to pain in her left shoulder   Vision - History  Baseline Vision Wears  glasses all the time  Cognition  Overall Cognitive Status Appears within functional limits for tasks assessed  Sensation  Light Touch Appears Intact  LUE AROM (degrees)  LUE Overall AROM Comments assessed in seated  Left Shoulder Flexion 80 Degrees  Left Shoulder ABduction 85 Degrees  Left Shoulder Internal Rotation 85 Degrees  Left Shoulder External Rotation 0 Degrees  Left Wrist Extension 50 Degrees  Left Wrist Flexion 60 Degrees  LUE PROM (degrees)  LUE Overall PROM Comments Shoulder PROM is 75% in supine.  Wrist PROM is WFL  LUE Strength  Left Shoulder Flexion (4-/5)  Left Shoulder ABduction (4-/5)  Left Shoulder Internal Rotation (4-/5)  Left Shoulder External Rotation 3-/5  Left Wrist Flexion 4/5  Left Wrist Extension 4/5  Grip (lbs) 34 pounds (left 15 pounds)  Palpation  Palpation mod-max fascial restrictions in left scapualr region    Written Expression  Dominant Hand Right    Exercise/Treatments MFR and manual stretching to left upper arm, shoulder, and scapualr region to decrease pain and restrictions and increase pain free mobility in left shoulder.        Occupational Therapy Assessment and Plan OT Assessment and Plan Clinical Impression Statement: A:  Patient is an 78 year old female with left shoulder pain and weakness which is limiting her I with her BADLS.   Pt will benefit from skilled therapeutic intervention in order to improve on the following deficits: Decreased range of motion;Decreased strength;Increased muscle spasms;Increased fascial restricitons;Impaired UE functional use;Pain Rehab Potential: Good OT Frequency: Min 2X/week OT Duration: 6 weeks OT Treatment/Interventions: Self-care/ADL training;Therapeutic exercise;Therapeutic activities;Manual therapy;Patient/family education;Modalities OT Plan: P:  Skilled OT intervention to decrease fascial restrictions and pain and increase AROM and strength needed for increased I with BADLs.  Treatment Plan:   MFR and manual stretching to left shoulder region.  PROM and AAROM in supine.  seated elev, ext, ret, row.  ball stretches, pulleys.  Wrist AROM exercises.  Progress as tolerated.    Goals Short Term Goals Time to Complete Short Term Goals: 3 weeks Short Term Goal 1: Patient will be educated on HEP. Short Term Goal 2: Patient will increase left shoulder PROM to [facility]  for increased I with all B/IADLs and leisure activities. Short Term Goal 3: Patient will increase left shoulder and wrist strength to 4/5 for increased abilty to lift plates in the kitchen. Short Term Goal 4: Patient will decrease pain in her left shoulder and wrist to 6/10 when getting dressed.  Short Term Goal 5: Patient will decrease fascial restrictions in her shoulder and wrist to min-mod. Long Term Goals Time to Complete Long Term Goals: 6 weeks Long Term Goal 1: Patient will return to prior level of I with all B/IADLs.   Long Term Goal 2: Patient will increase left shoulder AROM to [facility] for increased independence with combing her hair. Long Term Goal 3: Patient will increase left shoulder strength to 4+/5 for increased ability to push herself in and out of her chair.  Long Term Goal 4: Patient will decrease left shoulder pain to 2/10 when getting dressed. Long Term Goal 5: Patient will decrease fascial restrictions to minimal in her left shoulder region.   Problem List Patient Active Problem List  Diagnosis  . Muscle weakness (generalized)  . Difficulty in walking  . Pain in joint, shoulder region    End of Session Activity Tolerance: Patient tolerated treatment well General Behavior During Session: [facility] for tasks performed Cognition: [facility] for tasks performed OT Plan of Care OT Home Exercise Plan: towel slides and wrist AROM exercises.    GO Functional Assessment Tool Used: UEFI scored 6% I level and 94% impairment level Functional Limitation: Self care Self Care Current Status [id number]): At least 80 percent but less  than 100 percent impaired, limited or restricted Self Care Goal Status [id number]): At least 20 percent but less than 40 percent impaired, limited or restricted  Vangie Bicker, OTR/L  03/24/2012, 4:45 PM  Physician Documentation Your signature is required to indicate approval of the treatment plan as stated above.  Please sign and either send electronically or make a copy of this report for your files and return this physician signed original.  Please mark one 1.__approve of plan  2. ___approve of plan with the following conditions.   ______________________________                                                          _____________________ Physician Signature                                                                                                             Date

## 2012-03-26 ENCOUNTER — Ambulatory Visit (HOSPITAL_COMMUNITY)
Admission: RE | Admit: 2012-03-26 | Discharge: 2012-03-26 | Disposition: A | Discharge status: Home / Self Care | Payer: Medicare Other | Source: Ambulatory Visit | Attending: Orthopedic Surgery | Admitting: Orthopedic Surgery

## 2012-03-26 DIAGNOSIS — I10 Essential (primary) hypertension: Secondary | ICD-10-CM | POA: Insufficient documentation

## 2012-03-26 DIAGNOSIS — Z9181 History of falling: Secondary | ICD-10-CM | POA: Insufficient documentation

## 2012-03-26 DIAGNOSIS — R262 Difficulty in walking, not elsewhere classified: Secondary | ICD-10-CM | POA: Insufficient documentation

## 2012-03-26 DIAGNOSIS — IMO0001 Reserved for inherently not codable concepts without codable children: Secondary | ICD-10-CM | POA: Insufficient documentation

## 2012-03-26 DIAGNOSIS — M6281 Muscle weakness (generalized): Secondary | ICD-10-CM | POA: Insufficient documentation

## 2012-03-26 DIAGNOSIS — M25519 Pain in unspecified shoulder: Secondary | ICD-10-CM

## 2012-03-26 NOTE — Progress Notes (Signed)
Occupational Therapy Treatment Patient Details  Name: [name] MRN: [id number] Date of Birth: 05/18/1924  Today's Date: 03/26/2012 Time: [id number] OT Time Calculation (min): 47 min Manual Therapy [id number] 19' Therapeutic Exercise [id number] 27'  Visit#: 2  of 12   Re-eval: 04/21/12 Assessment Diagnosis: Left Shoulder Pain  Authorization: [employer]   Authorization Time Period: before 10th visit   Authorization Visit#: 2  of 10   Subjective Symptoms/Limitations Symptoms: S:  I am doing ok, I fell but am ok.  Precautions/Restrictions     Exercise/Treatments  03/26/12 1000  Shoulder Exercises: Supine  Protraction PROM;AAROM;10 reps  Horizontal ABduction PROM;AAROM;10 reps  External Rotation PROM;AAROM;10 reps  Internal Rotation PROM;AAROM;10 reps  Flexion AAROM;10 reps  ABduction PROM;AAROM;10 reps  Shoulder Exercises: Seated  Elevation AROM;10 reps  Extension AROM;10 reps  Row AROM;10 reps  Other Seated Exercises wrist AROM all rangesx10  Shoulder Exercises: Therapy Ball  Flexion 15 reps  ABduction 15 reps        Manual Therapy Manual Therapy: Myofascial release Myofascial Release: MFR and manual stretching to left upper arm, shoulder, and scapualr region to decrease pain and restrictions and increase pain free mobility in left shoulder  Occupational Therapy Assessment and Plan OT Assessment and Plan Clinical Impression Statement: A:  Added multiple new exercises which patient tolerated well, pain decreased to 3/10 after session. OT Plan: P; Add pulleys at next visit.  Stay with patient as she ambulates secondary to frequent falls.   Goals Short Term Goals Time to Complete Short Term Goals: 3 weeks Short Term Goal 1: Patient will be educated on HEP. Short Term Goal 2: Patient will increase left shoulder PROM to [facility]  for increased I with all B/IADLs and leisure activities. Short Term Goal 3: Patient will increase left shoulder and wrist  strength to 4/5 for increased abilty to lift plates in the kitchen. Short Term Goal 4: Patient will decrease pain in her left shoulder and wrist to 6/10 when getting dressed.  Short Term Goal 5: Patient will decrease fascial restrictions in her shoulder and wrist to min-mod. Long Term Goals Time to Complete Long Term Goals: 6 weeks Long Term Goal 1: Patient will return to prior level of I with all B/IADLs.   Long Term Goal 2: Patient will increase left shoulder AROM to [facility] for increased independence with combing her hair. Long Term Goal 3: Patient will increase left shoulder strength to 4+/5 for increased ability to push herself in and out of her chair. Long Term Goal 4: Patient will decrease left shoulder pain to 2/10 when getting dressed. Long Term Goal 5: Patient will decrease fascial restrictions to minimal in her left shoulder region.   Problem List Patient Active Problem List  Diagnosis  . Muscle weakness (generalized)  . Difficulty in walking  . Pain in joint, shoulder region    End of Session Activity Tolerance: Patient tolerated treatment well General Behavior During Session: [facility] for tasks performed Cognition: [facility] for tasks performed  GO   Lavone Barrientes L. Amarie Viles, COTA/L  03/26/2012, 4:04 PM

## 2012-03-26 NOTE — Progress Notes (Signed)
Physical Therapy Treatment Patient Details  Name: [name] MRN: [id number] Date of Birth: 09-Jan-1924  Today's Date: 03/26/2012 Time: [id number] PT Time Calculation (min): 39 min Charges: 15' NMR, 24' TE Visit#: 2  of 8   Re-eval: 04/23/12    Authorization: [employer]   Authorization Time Period:    Authorization Visit#: 2  of 10    Subjective: Symptoms/Limitations Symptoms: Pt reports that she has been doing her exercises and is doing well.  Pain Assessment Currently in Pain?: No/denies  Precautions/Restrictions  Precautions Precautions: Fall  Exercise/Treatments Aerobic Stationary Bike: 6' w/AA to place feet into bike x3 secondary to weakness; completed for activity tolerance Standing Heel Raises: 20 reps;Limitations Heel Raises Limitations: Toe raises x20 Knee Flexion: Both;10 reps;Limitations Knee Flexion Limitations: 3# Functional Squat: Limitations Functional Squat Limitations: Pt fell during 1st rep, did not complete today Other Standing Knee Exercises: Hip ABD and Extension BLE x10x 2sec hold each w/AA for R hip form and mechanics 3#  Standing Eyes Opened: Narrow base of support (BOS);3 reps;Time;Limitations;Solid surface Standing Eyes Opened Time: 60 sec Standing Eyes Opened Limitations: Min A Standing Eyes Closed: Wide (BOA);Solid surface;3 reps;Time;Limitations Standing Eyes Closed Time: 60 sec Standing Eyes Closed Limitations: min A  Seated Long Arc Quad: Both;15 reps;Weights Long Arc Quad Weight: 3 lbs. Other Seated Knee Exercises: Hip ABD w/Grn t-band x10, Hip Add w/soccer ball 10x10 sec holds  Physical Therapy Assessment and Plan PT Assessment and Plan Clinical Impression Statement: [facility] portal complete today secondary to controlled fall with first activity of functional squats.  I was able to use my R foot to ease patient to the ground on her L side.  Both arms were on the parallel bars.  Assessed pt's hips and knees after incident.  She  denies pain and declined ice.  Daughter was notified and did not have any questions or concerns.  Today pt was able to complete all of her activities after her controlled fall.  She continues to have greatest concerns with impaired balance and decreased strength.  PT Plan: Continue with low level balance activities and strengthening.  Add mat activities (SLR, prone hs curl, gluteal sets)     Goals    Problem List Patient Active Problem List  Diagnosis  . Muscle weakness (generalized)  . Difficulty in walking  . Pain in joint, shoulder region    PT - End of Session Equipment Utilized During Treatment: Gait belt Activity Tolerance: Patient limited by fatigue PT Plan of Care PT Patient Instructions: Discussed with pt and daughter about her fall.  Asked if she had any questions of concerns.  Consulted and Agree with Plan of Care: Patient;Family member/caregiver Family Member Consulted: Daughter: Rosilyn Mings, PT 03/26/2012, 10:50 AM

## 2012-03-30 ENCOUNTER — Ambulatory Visit (HOSPITAL_COMMUNITY)
Admission: RE | Admit: 2012-03-30 | Discharge: 2012-03-30 | Disposition: A | Discharge status: Home / Self Care | Payer: Medicare Other | Source: Ambulatory Visit | Attending: Orthopedic Surgery | Admitting: Orthopedic Surgery

## 2012-03-30 DIAGNOSIS — M25519 Pain in unspecified shoulder: Secondary | ICD-10-CM

## 2012-03-30 NOTE — Progress Notes (Signed)
Physical Therapy Treatment Patient Details  Name: MADDELYN MORTLAND MRN: [id number] Date of Birth: 02-Sep-1923  Today's Date: 03/30/2012 Time: 0940-1015 PT Time Calculation (min): 35 min Visit#: 3  of 8   Re-eval: 04/23/12 Authorization: [employer] Medicare  Authorization Visit#: 3  of 8   Charges:  therex 32'  Subjective: Symptoms/Limitations Symptoms: Pt. states she is not hurting today.  States her R LE just gives out on her sometimes.   Pain Assessment Currently in Pain?: No/denies   Exercise/Treatments Aerobic Stationary Bike: 6' w/AA to place feet into bike x3 secondary to weakness; completed for activity tolerance Standing Heel Raises: 20 reps;Limitations Heel Raises Limitations: Toe raises x20 Seated Long Arc Quad: Both;15 reps;Weights Long Arc Quad Weight: 3 lbs. Other Seated Knee Exercises: Hip ABD w/blue t-band x10, Hip Add w/soccer ball 10x10 sec holds Supine Bridges: 10 reps Straight Leg Raises: 10 reps;Both Sidelying Hip ABduction: 10 reps;Both Hip ADduction: 10 reps;Both     Physical Therapy Assessment and Plan PT Assessment and Plan Clinical Impression Statement: Added mat exercises per PT POC.  Pt. able to complete all with noted weakness in LE's/rests between each exercise.  VC's/manual cues to perform exercises slowly/controlled and hold in end range. PT Plan: Progress low level balance activties; add prone exercises next visit (SLR, HS curls, glute sets) per PT POC.     Problem List Patient Active Problem List  Diagnosis  . Muscle weakness (generalized)  . Difficulty in walking  . Pain in joint, shoulder region    PT - End of Session Activity Tolerance: Patient tolerated treatment well General Behavior During Session: [facility] for tasks performed Cognition: [facility] for tasks performed   Teena Irani, PTA/CLT 03/30/2012, 10:20 AM

## 2012-03-30 NOTE — Progress Notes (Signed)
Occupational Therapy Treatment Patient Details  Name: [name] MRN: [id number] Date of Birth: 14-Mar-1924  Today's Date: 03/30/2012 Time: [id number] OT Time Calculation (min): 35 min Manual Therapy [id number] 21' Therapeutic Exercise [phone] 13'  Visit#: 3  of 12   Re-eval: 04/21/12    Authorization: [employer]   Authorization Time Period: before 10th visit   Authorization Visit#: 3  of 10   Subjective Symptoms/Limitations Symptoms: S:  No falls since the last time I was here. Pain Assessment Currently in Pain?: No/denies Pain Score: 0-No pain Pain Location: Shoulder Pain Orientation: Left Pain Type: Acute pain  Precautions/Restrictions     Exercise/Treatments Supine Protraction: PROM;AAROM;12 reps Horizontal ABduction: PROM;AAROM;12 reps External Rotation: PROM;AAROM;12 reps Internal Rotation: PROM;AAROM;12 reps Flexion: PROM;AAROM;12 reps ABduction: PROM;AAROM;12 reps Seated Elevation: AROM;10 reps Extension: AROM;10 reps Row: AROM;10 reps Other Seated Exercises: wrist AROM all rangesx10 Pulleys Flexion: 2 minutes ABduction: 2 minutes Therapy Ball Flexion: 15 reps ABduction: 15 reps     Manual Therapy Manual Therapy: Myofascial release Myofascial Release: MFR and manual stretching to left upper arm, shoulder, and scapualr region to decrease pain and restrictions and increase pain free mobility in left shoulder   Occupational Therapy Assessment and Plan OT Assessment and Plan Clinical Impression Statement: A:  Added pulleys today to increase ROM, needed max assist to keep shoulder hzn abducted with pulley abduction stretch. OT Plan: P:  Attempt to decrease amount of assist with pulleys to min.   Goals Short Term Goals Time to Complete Short Term Goals: 3 weeks Short Term Goal 1: Patient will be educated on HEP. Short Term Goal 2: Patient will increase left shoulder PROM to [facility]  for increased I with all B/IADLs and leisure  activities. Short Term Goal 3: Patient will increase left shoulder and wrist strength to 4/5 for increased abilty to lift plates in the kitchen. Short Term Goal 4: Patient will decrease pain in her left shoulder and wrist to 6/10 when getting dressed.  Short Term Goal 5: Patient will decrease fascial restrictions in her shoulder and wrist to min-mod. Long Term Goals Time to Complete Long Term Goals: 6 weeks Long Term Goal 1: Patient will return to prior level of I with all B/IADLs.   Long Term Goal 2: Patient will increase left shoulder AROM to [facility] for increased independence with combing her hair. Long Term Goal 3: Patient will increase left shoulder strength to 4+/5 for increased ability to push herself in and out of her chair. Long Term Goal 4: Patient will decrease left shoulder pain to 2/10 when getting dressed. Long Term Goal 5: Patient will decrease fascial restrictions to minimal in her left shoulder region.   Problem List Patient Active Problem List  Diagnosis  . Muscle weakness (generalized)  . Difficulty in walking  . Pain in joint, shoulder region       [place], Carlyle Mcelrath L 03/30/2012, 9:34 AM

## 2012-04-01 ENCOUNTER — Ambulatory Visit (HOSPITAL_COMMUNITY)
Admission: RE | Admit: 2012-04-01 | Discharge: 2012-04-01 | Disposition: A | Discharge status: Home / Self Care | Payer: Medicare Other | Source: Ambulatory Visit | Attending: Orthopedic Surgery | Admitting: Orthopedic Surgery

## 2012-04-01 NOTE — Progress Notes (Signed)
Physical Therapy Treatment Patient Details  Name: [name] MRN: [id number] Date of Birth: 06-09-1923  Today's Date: 04/01/2012 Time: 0850-0935 PT Time Calculation (min): 45 min Visit#: 4  of 8   Re-eval: 04/23/12 Authorization: [employer]  Authorization Visit#: 4  of 8   Charges:  therex 40'  Subjective: Symptoms/Limitations Symptoms: Pt. states her arms are sore today but her legs are OK.  Reports a little pain posterior R knee rated 6/10. Pain Assessment Currently in Pain?: Yes Pain Score:   6 Pain Location: Knee Pain Orientation: Right;Posterior   Exercise/Treatments Aerobic Stationary Bike: 6' w/AA to place feet into bike x3 secondary to weakness; completed for activity tolerance Seated Long Arc Quad: 20 reps Long Arc Quad Weight: 3 lbs. Supine Bridges: 15 reps Straight Leg Raises: 10 reps;Both Sidelying Hip ABduction: 15 reps;Both Hip ADduction: 15 reps;Both Prone  Hamstring Curl: 10 reps;Limitations Hamstring Curl Limitations: B 3# Hip Extension: 10 reps;Both      Physical Therapy Assessment and Plan PT Assessment and Plan Clinical Impression Statement: Added prone exercises with noted eccentric weakness and decreased coordination.  Improved bed mobility noted today and ease in LE positioning/movement.  Increased other reps without difficulty.  Completed session today on bike. PT Plan: Progress low level balance activities; add glute sets/heelsqueezes in prone next visit; resume standing/balance activies as able.     Problem List Patient Active Problem List  Diagnosis  . Muscle weakness (generalized)  . Difficulty in walking  . Pain in joint, shoulder region    PT - End of Session Activity Tolerance: Patient tolerated treatment well General Behavior During Session: [facility] for tasks performed Cognition: [facility] for tasks performed   Teena Irani, PTA/CLT 04/01/2012, 9:35 AM

## 2012-04-06 ENCOUNTER — Ambulatory Visit (HOSPITAL_COMMUNITY)
Admission: RE | Admit: 2012-04-06 | Discharge: 2012-04-06 | Disposition: A | Discharge status: Home / Self Care | Payer: Medicare Other | Source: Ambulatory Visit | Attending: Orthopedic Surgery | Admitting: Orthopedic Surgery

## 2012-04-06 DIAGNOSIS — M25519 Pain in unspecified shoulder: Secondary | ICD-10-CM

## 2012-04-06 NOTE — Progress Notes (Signed)
Occupational Therapy Treatment Patient Details  Name: [name] MRN: [id number] Date of Birth: 07/08/1923  Today's Date: 04/06/2012 Time: [id number] OT Time Calculation (min): 43 min Manual Therapy [id number]' Therapeutic Exercises 901-930 29' Visit#: 4  of 12   Re-eval: 04/21/12    Authorization: [employer]   Authorization Time Period: before 10th visit   Authorization Visit#: 4  of 10   Subjective S:  I am doing my exercises and I do think it is helping. Pain Assessment Currently in Pain?: Yes Pain Score:   6 Pain Location: Shoulder Pain Orientation: Left Pain Type: Acute pain  Precautions/Restrictions   progress as tolerated   Exercise/Treatments Supine Protraction: PROM;10 reps;AAROM;15 reps Horizontal ABduction: PROM;10 reps;AAROM;15 reps External Rotation: PROM;10 reps;AAROM;15 reps Internal Rotation: PROM;10 reps;AAROM;15 reps Flexion: PROM;10 reps;AAROM;15 reps ABduction: PROM;10 reps;AAROM;15 reps Seated Elevation: AROM;15 reps Extension: AROM;15 reps Row: AROM;15 reps Protraction: AAROM;10 reps Horizontal ABduction: AAROM;10 reps External Rotation: AAROM;10 reps Internal Rotation: AAROM;10 reps Other Seated Exercises: wrist strengthening with 1# x 10 reps  Pulleys Flexion: 2 minutes ABduction: 2 minutes Therapy Ball Flexion: 20 reps ABduction: 20 reps    Manual Therapy Manual Therapy: Myofascial release Myofascial Release: MFR and manual stretching to left upper arm, shoulder, and scapualr region to decrease pain and restrictions and increase pain free mobility in left shoulder [phone]  Occupational Therapy Assessment and Plan OT Assessment and Plan Clinical Impression Statement: A:  good form and full AAROM this date with supine exercises.  Added AAROM in seated due to full AAROM in supine, however, required facilitation and could not complete flexion and abduction  OT Plan: P:  Attempt AROM in supine and decrease amount of  facilitation required with seated AAROM as patients strength improves.   Goals Short Term Goals Time to Complete Short Term Goals: 3 weeks Short Term Goal 1: Patient will be educated on HEP. Short Term Goal 1 Progress: Progressing toward goal Short Term Goal 2: Patient will increase left shoulder PROM to [facility]  for increased I with all B/IADLs and leisure activities. Short Term Goal 2 Progress: Progressing toward goal Short Term Goal 3: Patient will increase left shoulder and wrist strength to 4/5 for increased abilty to lift plates in the kitchen. Short Term Goal 3 Progress: Progressing toward goal Short Term Goal 4: Patient will decrease pain in her left shoulder and wrist to 6/10 when getting dressed.  Short Term Goal 4 Progress: Progressing toward goal Short Term Goal 5: Patient will decrease fascial restrictions in her shoulder and wrist to min-mod. Short Term Goal 5 Progress: Progressing toward goal Long Term Goals Time to Complete Long Term Goals: 6 weeks Long Term Goal 1: Patient will return to prior level of I with all B/IADLs.   Long Term Goal 1 Progress: Progressing toward goal Long Term Goal 2: Patient will increase left shoulder AROM to [facility] for increased independence with combing her hair. Long Term Goal 2 Progress: Progressing toward goal Long Term Goal 3: Patient will increase left shoulder strength to 4+/5 for increased ability to push herself in and out of her chair. Long Term Goal 3 Progress: Progressing toward goal Long Term Goal 4: Patient will decrease left shoulder pain to 2/10 when getting dressed. Long Term Goal 4 Progress: Progressing toward goal Long Term Goal 5: Patient will decrease fascial restrictions to minimal in her left shoulder region.  Long Term Goal 5 Progress: Progressing toward goal  Problem List Patient Active Problem List  Diagnosis  .  Muscle weakness (generalized)  . Difficulty in walking  . Pain in joint, shoulder region    End of  Session Activity Tolerance: Patient tolerated treatment well General Behavior During Session: [facility] for tasks performed Cognition: [facility] for tasks performed  [place], OTR/L  04/06/2012, 9:29 AM

## 2012-04-06 NOTE — Progress Notes (Signed)
Physical Therapy Treatment Patient Details  Name: [name] MRN: [id number] Date of Birth: [birth year]  Today's Date: 04/06/2012 Time: 0933-1015 PT Time Calculation (min): 42 min Visit#: 5  of 8   Re-eval: 04/23/12 Authorization: [employer]  Authorization Visit#: 5  of 8   Charges:  therex 40'  Subjective: Pain Assessment Currently in Pain?: Yes Pain Score:   6 Pain Location: Shoulder Pain Orientation: Left Pain Type: Acute pain   Exercise/Treatments Aerobic Stationary Bike: 8' w/AA to place feet into bike for activity tolerance Standing Heel Raises: 15 reps Heel Raises Limitations: Toe raises x15 Knee Flexion: Both;10 reps Functional Squat: 10 reps Functional Squat Limitations: max assist for form and prevention of fall Other Standing Knee Exercises: Hip ABD and Extension BLE x10x 2sec hold each w/AA for R hip form and mechanics 3#  Seated Long Arc Quad: 15 reps Long Arc Quad Weight: 4 lbs. Other Seated Knee Exercises: sit to stand no UE 5 reps   Manual Therapy Manual Therapy: Myofascial release Myofascial Release: MFR and manual stretching to left upper arm, shoulder, and scapualr region to decrease pain and restrictions and increase pain free mobility in left shoulder [phone]  Physical Therapy Assessment and Plan PT Assessment and Plan Clinical Impression Statement: Resume standing exercises today with max guarding due to R knee instability.  Pt. able to complete all activities with max manual cues/vcs for posture and form.  Worked on sit<-->stand to help strengthen LE and increase safety.  Pt tends to pull up on walker and lean far forward with transition.  Pt. with noted LE weakness.  Increased to 4# weight with LAQ without difficulty. PT Plan: Progress low level balance activities; add glute sets/heelsqueezes in prone next visit; Progress standing/balance activies as able.     Problem List Patient Active Problem List  Diagnosis  . Muscle weakness  (generalized)  . Difficulty in walking  . Pain in joint, shoulder region    PT - End of Session Activity Tolerance: Patient tolerated treatment well General Behavior During Session: [facility] for tasks performed Cognition: [facility] for tasks performed   Teena Irani, PTA/CLT 04/06/2012, 10:20 AM

## 2012-04-08 ENCOUNTER — Ambulatory Visit (HOSPITAL_COMMUNITY): Payer: Medicare Other | Admitting: Physical Therapy

## 2012-04-12 ENCOUNTER — Other Ambulatory Visit (HOSPITAL_COMMUNITY): Payer: Self-pay | Admitting: Orthopedic Surgery

## 2012-04-12 DIAGNOSIS — M25519 Pain in unspecified shoulder: Secondary | ICD-10-CM

## 2012-04-13 ENCOUNTER — Ambulatory Visit (HOSPITAL_COMMUNITY): Payer: Medicare Other | Admitting: Specialist

## 2012-04-15 ENCOUNTER — Ambulatory Visit (HOSPITAL_COMMUNITY)
Admission: RE | Admit: 2012-04-15 | Discharge: 2012-04-15 | Disposition: A | Discharge status: Home / Self Care | Payer: Medicare Other | Source: Ambulatory Visit | Attending: Orthopedic Surgery | Admitting: Orthopedic Surgery

## 2012-04-15 ENCOUNTER — Ambulatory Visit (HOSPITAL_COMMUNITY): Payer: Medicare Other | Admitting: Physical Therapy

## 2012-04-15 DIAGNOSIS — M75120 Complete rotator cuff tear or rupture of unspecified shoulder, not specified as traumatic: Secondary | ICD-10-CM | POA: Insufficient documentation

## 2012-04-15 DIAGNOSIS — M25519 Pain in unspecified shoulder: Secondary | ICD-10-CM

## 2012-05-06 ENCOUNTER — Other Ambulatory Visit: Payer: Self-pay | Admitting: Orthopedic Surgery

## 2012-05-21 NOTE — Progress Notes (Signed)
PREOP APPOINTMENT FOR ROTATOR CUFF SURGERY ARRANGED WITH PT'S DAUGHTER ANDREE -SHE STATES HER MOTHER HAS HAD RECENT FALL - AND FELL ON THE SHOULDER SHE IS HAVING SURGERY ON. I ASKED ANDREE TO NOTIFY DR. Pearla Dubonnet OFFICE OF HER MOTHER'S FALL AND ASK IF HE NEEDS TO SEE HER IN THE OFFICE BEFORE ANY SURGERY IS DONE.

## 2012-05-25 ENCOUNTER — Encounter (HOSPITAL_COMMUNITY): Payer: Self-pay | Admitting: Pharmacy Technician

## 2012-05-28 ENCOUNTER — Encounter (HOSPITAL_COMMUNITY)
Admission: RE | Admit: 2012-05-28 | Discharge: 2012-05-28 | Disposition: A | Discharge status: Home / Self Care | Payer: Medicare Other | Source: Ambulatory Visit | Attending: Orthopedic Surgery | Admitting: Orthopedic Surgery

## 2012-05-28 ENCOUNTER — Encounter (HOSPITAL_COMMUNITY): Payer: Self-pay

## 2012-05-28 HISTORY — DX: Hyperlipidemia, unspecified: E78.5

## 2012-05-28 HISTORY — DX: Unspecified rotator cuff tear or rupture of unspecified shoulder, not specified as traumatic: M75.100

## 2012-05-28 HISTORY — DX: Personal history of other medical treatment: Z92.89

## 2012-05-28 LAB — BASIC METABOLIC PANEL
CO2: 25 mEq/L (ref 19–32)
Calcium: 10.6 mg/dL — ABNORMAL HIGH (ref 8.4–10.5)
GFR calc Af Amer: 55 mL/min — ABNORMAL LOW (ref >90)
GFR calc non Af Amer: 48 mL/min — ABNORMAL LOW (ref >90)
Sodium: 140 mEq/L (ref 135–145)

## 2012-05-28 LAB — CBC
MCH: 30.4 pg (ref 26.0–34.0)
MCHC: 33 g/dL (ref 30.0–36.0)
Platelets: 268 10*3/uL (ref 150–400)
RBC: 3.81 MIL/uL — ABNORMAL LOW (ref 3.87–5.11)

## 2012-05-28 LAB — SURGICAL PCR SCREEN: MRSA, PCR: NEGATIVE

## 2012-05-28 NOTE — Patient Instructions (Addendum)
[name]  05/28/2012                           YOUR PROCEDURE IS SCHEDULED ON:  06/02/12               PLEASE REPORT TO SHORT STAY CENTER AT : 9:00 am               CALL THIS NUMBER IF ANY PROBLEMS THE DAY OF SURGERY :               [phone]                      REMEMBER:   Do not eat food or drink liquids AFTER MIDNIGHT  May have clear liquids UNTIL 6 HOURS BEFORE SURGERY  Clear liquids include soda, tea, black coffee, apple or grape juice, broth.  Take these medicines the morning of surgery with A SIP OF WATER:  ALLOPURINOL / AMLODIPINE / HYDRALAZINE / METOPROLOL / RANITIDINE / PROBENECID / USE EYE DROPS   Do not wear jewelry, make-up   Do not wear lotions, powders, or perfumes.   Do not shave legs or underarms 12 hrs. before surgery (men may shave face)  Do not bring valuables to the hospital.  Contacts, dentures or bridgework may not be worn into surgery.  Leave suitcase in the car. After surgery it may be brought to your room.  For patients admitted to the hospital more than one night, checkout time is 11:00                          The day of discharge.   Patients discharged the day of surgery will not be allowed to drive home                             If going home same day of surgery, must have someone stay with you first                           24 hrs at home and arrange for some one to drive you home from hospital.    Special Instructions:   Please read over the following fact sheets that you were given:               1. MRSA  INFORMATION                      2. [place]                                                X_____________________________________________________________________        Failure to follow these instructions may result in cancellation of your surgery

## 2012-06-02 ENCOUNTER — Encounter (HOSPITAL_COMMUNITY)
Admission: RE | Disposition: A | Discharge status: Skilled Nursing Facility | Payer: Self-pay | Source: Ambulatory Visit | Attending: Orthopedic Surgery

## 2012-06-02 ENCOUNTER — Ambulatory Visit (HOSPITAL_COMMUNITY): Payer: Medicare Other | Admitting: Anesthesiology

## 2012-06-02 ENCOUNTER — Encounter (HOSPITAL_COMMUNITY): Payer: Self-pay | Admitting: Anesthesiology

## 2012-06-02 ENCOUNTER — Encounter (HOSPITAL_COMMUNITY): Payer: Self-pay

## 2012-06-02 ENCOUNTER — Observation Stay (HOSPITAL_COMMUNITY)
Admission: RE | Admit: 2012-06-02 | Discharge: 2012-06-03 | Disposition: A | Discharge status: Skilled Nursing Facility | Payer: Medicare Other | Source: Ambulatory Visit | Attending: Orthopedic Surgery | Admitting: Orthopedic Surgery

## 2012-06-02 DIAGNOSIS — Z79899 Other long term (current) drug therapy: Secondary | ICD-10-CM | POA: Insufficient documentation

## 2012-06-02 DIAGNOSIS — M719 Bursopathy, unspecified: Principal | ICD-10-CM | POA: Insufficient documentation

## 2012-06-02 DIAGNOSIS — M75122 Complete rotator cuff tear or rupture of left shoulder, not specified as traumatic: Secondary | ICD-10-CM

## 2012-06-02 DIAGNOSIS — E119 Type 2 diabetes mellitus without complications: Secondary | ICD-10-CM | POA: Insufficient documentation

## 2012-06-02 DIAGNOSIS — E785 Hyperlipidemia, unspecified: Secondary | ICD-10-CM | POA: Insufficient documentation

## 2012-06-02 DIAGNOSIS — M19019 Primary osteoarthritis, unspecified shoulder: Secondary | ICD-10-CM | POA: Insufficient documentation

## 2012-06-02 DIAGNOSIS — I10 Essential (primary) hypertension: Secondary | ICD-10-CM | POA: Insufficient documentation

## 2012-06-02 DIAGNOSIS — M67919 Unspecified disorder of synovium and tendon, unspecified shoulder: Principal | ICD-10-CM | POA: Insufficient documentation

## 2012-06-02 DIAGNOSIS — K219 Gastro-esophageal reflux disease without esophagitis: Secondary | ICD-10-CM | POA: Insufficient documentation

## 2012-06-02 DIAGNOSIS — Z8673 Personal history of transient ischemic attack (TIA), and cerebral infarction without residual deficits: Secondary | ICD-10-CM | POA: Insufficient documentation

## 2012-06-02 HISTORY — PX: SHOULDER OPEN ROTATOR CUFF REPAIR: SHX2407

## 2012-06-02 LAB — GLUCOSE, CAPILLARY: Glucose-Capillary: 104 mg/dL — ABNORMAL HIGH (ref 70–99)

## 2012-06-02 SURGERY — REPAIR, ROTATOR CUFF, OPEN
Anesthesia: General | Site: Shoulder | Laterality: Left | Wound class: Clean

## 2012-06-02 MED ORDER — SODIUM CHLORIDE 0.9 % IV SOLN
INTRAVENOUS | Status: DC
  Administered 2012-06-02: 50 mL/h via INTRAVENOUS

## 2012-06-02 MED ORDER — ACETAMINOPHEN 650 MG RE SUPP: 650.0000 mg | Freq: Four times a day (QID) | RECTAL | Status: DC | PRN

## 2012-06-02 MED ORDER — 0.9 % SODIUM CHLORIDE (POUR BTL) OPTIME
TOPICAL | Status: DC | PRN
  Administered 2012-06-02: 1000 mL

## 2012-06-02 MED ORDER — METHOCARBAMOL 500 MG PO TABS: 500.0000 mg | ORAL_TABLET | Freq: Four times a day (QID) | ORAL | Status: DC | PRN

## 2012-06-02 MED ORDER — OXYCODONE-ACETAMINOPHEN 5-325 MG PO TABS
1.0000 | ORAL_TABLET | ORAL | Status: DC | PRN
  Administered 2012-06-02: 2 via ORAL
  Filled 2012-06-02: qty 2

## 2012-06-02 MED ORDER — LIDOCAINE HCL (CARDIAC) 20 MG/ML IV SOLN
INTRAVENOUS | Status: DC | PRN
  Administered 2012-06-02: 20 mg via INTRAVENOUS

## 2012-06-02 MED ORDER — HYDRALAZINE HCL 10 MG PO TABS
10.0000 mg | ORAL_TABLET | Freq: Every day | ORAL | Status: DC
  Administered 2012-06-03: 10 mg via ORAL
  Filled 2012-06-02 (×2): qty 1

## 2012-06-02 MED ORDER — ROPIVACAINE HCL 5 MG/ML IJ SOLN
INTRAMUSCULAR | Status: DC | PRN
  Administered 2012-06-02: 20 mL

## 2012-06-02 MED ORDER — PHENOL 1.4 % MT LIQD: 1.0000 | OROMUCOSAL | Status: DC | PRN

## 2012-06-02 MED ORDER — CEFAZOLIN SODIUM 1-5 GM-% IV SOLN
1.0000 g | Freq: Four times a day (QID) | INTRAVENOUS | Status: AC
  Administered 2012-06-02 – 2012-06-03 (×3): 1 g via INTRAVENOUS
  Filled 2012-06-02 (×3): qty 50

## 2012-06-02 MED ORDER — CISATRACURIUM BESYLATE (PF) 10 MG/5ML IV SOLN
INTRAVENOUS | Status: DC | PRN
  Administered 2012-06-02: 3 mg via INTRAVENOUS

## 2012-06-02 MED ORDER — ONDANSETRON HCL 4 MG/2ML IJ SOLN
INTRAMUSCULAR | Status: DC | PRN
  Administered 2012-06-02 (×2): 2 mg via INTRAVENOUS

## 2012-06-02 MED ORDER — FENTANYL CITRATE 0.05 MG/ML IJ SOLN
50.0000 ug | Freq: Once | INTRAMUSCULAR | Status: AC
  Administered 2012-06-02: 50 ug via INTRAVENOUS

## 2012-06-02 MED ORDER — DIPHENHYDRAMINE HCL 12.5 MG/5ML PO ELIX: 12.5000 mg | ORAL_SOLUTION | Freq: Four times a day (QID) | ORAL | Status: DC | PRN

## 2012-06-02 MED ORDER — LISINOPRIL 40 MG PO TABS
40.0000 mg | ORAL_TABLET | Freq: Every morning | ORAL | Status: DC
Start: 2012-06-02 — End: 2012-06-03
  Administered 2012-06-03: 40 mg via ORAL
  Filled 2012-06-02 (×2): qty 1

## 2012-06-02 MED ORDER — ONDANSETRON HCL 4 MG/2ML IJ SOLN: 4.0000 mg | Freq: Four times a day (QID) | INTRAMUSCULAR | Status: DC | PRN

## 2012-06-02 MED ORDER — PROPOFOL 10 MG/ML IV EMUL
INTRAVENOUS | Status: DC | PRN
  Administered 2012-06-02: 100 mg via INTRAVENOUS

## 2012-06-02 MED ORDER — PROBENECID 500 MG PO TABS
500.0000 mg | ORAL_TABLET | Freq: Every day | ORAL | Status: DC
  Administered 2012-06-02 – 2012-06-03 (×2): 500 mg via ORAL
  Filled 2012-06-02 (×2): qty 1

## 2012-06-02 MED ORDER — MELOXICAM 15 MG PO TABS
15.0000 mg | ORAL_TABLET | Freq: Every day | ORAL | Status: DC
Start: 2012-06-02 — End: 2012-06-03
  Administered 2012-06-02 – 2012-06-03 (×2): 15 mg via ORAL
  Filled 2012-06-02 (×2): qty 1

## 2012-06-02 MED ORDER — TIMOLOL MALEATE 0.25 % OP SOLN
1.0000 [drp] | Freq: Two times a day (BID) | OPHTHALMIC | Status: DC
  Administered 2012-06-02 – 2012-06-03 (×2): 1 [drp] via OPHTHALMIC
  Filled 2012-06-02: qty 5

## 2012-06-02 MED ORDER — DIPHENHYDRAMINE HCL 50 MG/ML IJ SOLN: 12.5000 mg | Freq: Four times a day (QID) | INTRAMUSCULAR | Status: DC | PRN

## 2012-06-02 MED ORDER — METHOCARBAMOL 100 MG/ML IJ SOLN
500.0000 mg | Freq: Four times a day (QID) | INTRAVENOUS | Status: DC | PRN
  Filled 2012-06-02: qty 5

## 2012-06-02 MED ORDER — SUCCINYLCHOLINE CHLORIDE 20 MG/ML IJ SOLN
INTRAMUSCULAR | Status: DC | PRN
  Administered 2012-06-02: 100 mg via INTRAVENOUS

## 2012-06-02 MED ORDER — METOCLOPRAMIDE HCL 5 MG/ML IJ SOLN: 5.0000 mg | Freq: Three times a day (TID) | INTRAMUSCULAR | Status: DC | PRN

## 2012-06-02 MED ORDER — GLIPIZIDE 10 MG PO TABS
10.0000 mg | ORAL_TABLET | Freq: Every day | ORAL | Status: DC
  Administered 2012-06-03: 10 mg via ORAL
  Filled 2012-06-02 (×2): qty 1

## 2012-06-02 MED ORDER — TIMOLOL HEMIHYDRATE 0.25 % OP SOLN
1.0000 [drp] | Freq: Two times a day (BID) | OPHTHALMIC | Status: DC
  Filled 2012-06-02 (×7): qty 1

## 2012-06-02 MED ORDER — ACETAMINOPHEN 325 MG PO TABS: 650.0000 mg | ORAL_TABLET | Freq: Four times a day (QID) | ORAL | Status: DC | PRN

## 2012-06-02 MED ORDER — LACTATED RINGERS IV SOLN
INTRAVENOUS | Status: DC
  Administered 2012-06-02: 1000 mL via INTRAVENOUS

## 2012-06-02 MED ORDER — GLYCOPYRROLATE 0.2 MG/ML IJ SOLN
INTRAMUSCULAR | Status: DC | PRN
  Administered 2012-06-02: .6 mg via INTRAVENOUS
  Administered 2012-06-02: 0.3 mg via INTRAVENOUS

## 2012-06-02 MED ORDER — MIDAZOLAM HCL 10 MG/2ML IJ SOLN: 1.0000 mg | INTRAMUSCULAR | Status: DC | PRN

## 2012-06-02 MED ORDER — NALOXONE HCL 0.4 MG/ML IJ SOLN: 0.4000 mg | INTRAMUSCULAR | Status: DC | PRN

## 2012-06-02 MED ORDER — PROMETHAZINE HCL 25 MG/ML IJ SOLN: 6.2500 mg | INTRAMUSCULAR | Status: DC | PRN

## 2012-06-02 MED ORDER — AMLODIPINE BESYLATE 10 MG PO TABS
10.0000 mg | ORAL_TABLET | Freq: Every morning | ORAL | Status: DC
  Administered 2012-06-03: 10 mg via ORAL
  Filled 2012-06-02: qty 1

## 2012-06-02 MED ORDER — SIMVASTATIN 20 MG PO TABS
20.0000 mg | ORAL_TABLET | Freq: Every day | ORAL | Status: DC
  Administered 2012-06-02: 20 mg via ORAL
  Filled 2012-06-02 (×2): qty 1

## 2012-06-02 MED ORDER — BUPIVACAINE-EPINEPHRINE 0.5% -1:200000 IJ SOLN
INTRAMUSCULAR | Status: DC | PRN
  Administered 2012-06-02: 50 mL

## 2012-06-02 MED ORDER — METOCLOPRAMIDE HCL 10 MG PO TABS: 5.0000 mg | ORAL_TABLET | Freq: Three times a day (TID) | ORAL | Status: DC | PRN

## 2012-06-02 MED ORDER — EPHEDRINE SULFATE 50 MG/ML IJ SOLN
INTRAMUSCULAR | Status: DC | PRN
  Administered 2012-06-02 (×4): 10 mg via INTRAVENOUS

## 2012-06-02 MED ORDER — POVIDONE-IODINE 7.5 % EX SOLN: Freq: Once | CUTANEOUS | Status: DC

## 2012-06-02 MED ORDER — NEOSTIGMINE METHYLSULFATE 1 MG/ML IJ SOLN
INTRAMUSCULAR | Status: DC | PRN
  Administered 2012-06-02: 4 mg via INTRAVENOUS

## 2012-06-02 MED ORDER — HYDROMORPHONE 0.3 MG/ML IV SOLN: INTRAVENOUS | Status: DC

## 2012-06-02 MED ORDER — MENTHOL 3 MG MT LOZG: 1.0000 | LOZENGE | OROMUCOSAL | Status: DC | PRN

## 2012-06-02 MED ORDER — SODIUM CHLORIDE 0.9 % IJ SOLN
OROMUCOSAL | Status: DC | PRN
  Administered 2012-06-02: 13:00:00 via TOPICAL

## 2012-06-02 MED ORDER — ALLOPURINOL 300 MG PO TABS
300.0000 mg | ORAL_TABLET | Freq: Every day | ORAL | Status: DC
  Administered 2012-06-03: 300 mg via ORAL
  Filled 2012-06-02 (×2): qty 1

## 2012-06-02 MED ORDER — LACTATED RINGERS IV SOLN
INTRAVENOUS | Status: DC | PRN
  Administered 2012-06-02 (×2): via INTRAVENOUS

## 2012-06-02 MED ORDER — LACTATED RINGERS IV SOLN: INTRAVENOUS | Status: DC

## 2012-06-02 MED ORDER — METOPROLOL TARTRATE 25 MG PO TABS
25.0000 mg | ORAL_TABLET | Freq: Every day | ORAL | Status: DC
  Administered 2012-06-03: 25 mg via ORAL
  Filled 2012-06-02 (×2): qty 1

## 2012-06-02 MED ORDER — ACETAMINOPHEN 10 MG/ML IV SOLN
INTRAVENOUS | Status: DC | PRN
  Administered 2012-06-02: 1000 mg via INTRAVENOUS

## 2012-06-02 MED ORDER — SODIUM CHLORIDE 0.9 % IJ SOLN: 9.0000 mL | INTRAMUSCULAR | Status: DC | PRN

## 2012-06-02 MED ORDER — ASPIRIN 81 MG PO CHEW
81.0000 mg | CHEWABLE_TABLET | Freq: Every day | ORAL | Status: DC
  Administered 2012-06-03: 81 mg via ORAL
  Filled 2012-06-02: qty 1

## 2012-06-02 MED ORDER — HYDROCODONE-ACETAMINOPHEN 5-325 MG PO TABS: 1.0000 | ORAL_TABLET | ORAL | Status: DC | PRN

## 2012-06-02 MED ORDER — ONDANSETRON HCL 4 MG PO TABS: 4.0000 mg | ORAL_TABLET | Freq: Four times a day (QID) | ORAL | Status: DC | PRN

## 2012-06-02 MED ORDER — FENTANYL CITRATE 0.05 MG/ML IJ SOLN: 25.0000 ug | INTRAMUSCULAR | Status: DC | PRN

## 2012-06-02 MED ORDER — CEFAZOLIN SODIUM-DEXTROSE 2-3 GM-% IV SOLR
2.0000 g | INTRAVENOUS | Status: AC
  Administered 2012-06-02: 2 g via INTRAVENOUS

## 2012-06-02 MED ORDER — HYDROMORPHONE HCL PF 1 MG/ML IJ SOLN: 0.5000 mg | INTRAMUSCULAR | Status: DC | PRN

## 2012-06-02 SURGICAL SUPPLY — 43 items
ANCH SUT 2 5.5 BABSR ASCP (Orthopedic Implant) ×1 IMPLANT
ANCHOR PEEK ZIP 5.5 NDL NO2 (Orthopedic Implant) ×1 IMPLANT
BAG SPEC THK2 15X12 ZIP CLS (MISCELLANEOUS) ×1
BAG ZIPLOCK 12X15 (MISCELLANEOUS) ×2 IMPLANT
BLADE OSCILLATING/SAGITTAL (BLADE) ×2
BLADE SW THK.38XMED LNG THN (BLADE) ×1 IMPLANT
CLEANER TIP ELECTROSURG 2X2 (MISCELLANEOUS) ×2 IMPLANT
CLOTH BEACON ORANGE TIMEOUT ST (SAFETY) ×2 IMPLANT
CONT SPECI 4OZ STER CLIK (MISCELLANEOUS) ×2 IMPLANT
COVER SURGICAL LIGHT HANDLE (MISCELLANEOUS) ×2 IMPLANT
DRAPE LG THREE QUARTER DISP (DRAPES) ×2 IMPLANT
DRAPE POUCH INSTRU U-SHP 10X18 (DRAPES) ×2 IMPLANT
DRAPE U-SHAPE 47X51 STRL (DRAPES) ×2 IMPLANT
DRSG ADAPTIC 3X8 NADH LF (GAUZE/BANDAGES/DRESSINGS) ×1 IMPLANT
DRSG EMULSION OIL 3X3 NADH (GAUZE/BANDAGES/DRESSINGS) ×2 IMPLANT
DRSG PAD ABDOMINAL 8X10 ST (GAUZE/BANDAGES/DRESSINGS) ×1 IMPLANT
DURAPREP 26ML APPLICATOR (WOUND CARE) ×2 IMPLANT
ELECT REM PT RETURN 9FT ADLT (ELECTROSURGICAL) ×2
ELECTRODE REM PT RTRN 9FT ADLT (ELECTROSURGICAL) ×1 IMPLANT
FACESHIELD LNG OPTICON STERILE (SAFETY) ×4 IMPLANT
GLOVE BIOGEL M 8.0 STRL (GLOVE) ×2 IMPLANT
GLOVE ECLIPSE 8.0 STRL XLNG CF (GLOVE) ×2 IMPLANT
GLOVE INDICATOR 8.0 STRL GRN (GLOVE) ×6 IMPLANT
GOWN STRL REIN XL XLG (GOWN DISPOSABLE) ×4 IMPLANT
KIT BASIN OR (CUSTOM PROCEDURE TRAY) ×2 IMPLANT
MANIFOLD NEPTUNE II (INSTRUMENTS) ×2 IMPLANT
NDL MA TROC 1/2 (NEEDLE) IMPLANT
NEEDLE MA TROC 1/2 (NEEDLE) IMPLANT
NS IRRIG 1000ML POUR BTL (IV SOLUTION) ×2 IMPLANT
PACK SHOULDER CUSTOM OPM052 (CUSTOM PROCEDURE TRAY) ×2 IMPLANT
POSITIONER SURGICAL ARM (MISCELLANEOUS) ×2 IMPLANT
SLING ARM IMMOBILIZER LRG (SOFTGOODS) ×2 IMPLANT
SLING ARM IMMOBILIZER XL (CAST SUPPLIES) ×1 IMPLANT
SPONGE GAUZE 4X4 12PLY (GAUZE/BANDAGES/DRESSINGS) ×1 IMPLANT
SPONGE SURGIFOAM ABS GEL 12-7 (HEMOSTASIS) ×2 IMPLANT
STAPLER VISISTAT 35W (STAPLE) ×2 IMPLANT
SUT BONE WAX W31G (SUTURE) ×2 IMPLANT
SUT ETHIBOND NAB CT1 #1 30IN (SUTURE) IMPLANT
SUT VIC AB 1 CT1 27 (SUTURE) ×4
SUT VIC AB 1 CT1 27XBRD ANTBC (SUTURE) ×2 IMPLANT
SUT VIC AB 2-0 CT1 27 (SUTURE) ×4
SUT VIC AB 2-0 CT1 27XBRD (SUTURE) ×2 IMPLANT
TAPE CLOTH SURG 4X10 WHT LF (GAUZE/BANDAGES/DRESSINGS) ×1 IMPLANT

## 2012-06-02 NOTE — Progress Notes (Signed)
patient states she fell at home but did not hurt herself yesterday

## 2012-06-02 NOTE — Brief Op Note (Signed)
06/02/2012  3:40 PM  PATIENT:  [name]  79 y.o. female  PRE-OPERATIVE DIAGNOSIS:  Left Shoulder AC Arthritis and Complete Rotator Cuff Tear  POST-OPERATIVE DIAGNOSIS:  Left Shoulder AC Arthritis and Complete Rotator Cuff Tear  PROCEDURE:  Procedure(s) (LRB) with comments: ROTATOR CUFF REPAIR SHOULDER OPEN (Left) - Left Shoulder Open Distal Clavicle Resection Anterior Acrominectomy and Rotator Cuff Repair  SURGEON:  Surgeon(s) and Role:    * Magnus Sinning, MD - Primary  PHYSICIAN ASSISTANT:   ASSISTANTS:Mr Delorse Lek [facility]   ANESTHESIA:   regional and general  EBL:  Total I/O In: 1440 [P.O.:240; I.V.:1200] Out: 575 [Urine:550; Blood:25]  BLOOD ADMINISTERED:none  DRAINS: none   LOCAL MEDICATIONS USED:  MARCAINE     SPECIMEN:  No Specimen  DISPOSITION OF SPECIMEN:  N/A  COUNTS:  YES  TOURNIQUET:  * No tourniquets in log *  DICTATION: .Other Dictation: Dictation Number [id number]  PLAN OF CARE: Admit for overnight observation  PATIENT DISPOSITION:  PACU - hemodynamically stable.   Delay start of Pharmacological VTE agent (>24hrs) due to surgical blood loss or risk of bleeding: yes

## 2012-06-02 NOTE — H&P (Signed)
[name] is an 79 y.o. female.   Chief Complaint: painful lt shoulder HPI:MRI demonstrates full thickness rotator cuff tear and ac arthritis  Past Medical History  Diagnosis Date  . Hypertension   . GERD (gastroesophageal reflux disease)   . Arthritis   . Slipped intervertebral disc   . Bilateral carpal tunnel syndrome   . Glaucoma(365)   . Cataracts, both eyes   . 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 Diabetes mellitus ORAL MED  . Gout   . Diastolic heart failure CARDIOLOGIST- DR Daneen Schick---  LAST VISIT NOTE W/ CHART  . Heart murmur   . History of falling RECENT FALL 1 WK AGO--   NO INJURY  . Right leg weakness   . Walker as ambulation aid   . At risk for falling   . Diabetic neuropathy both hands and legs  . Hyperlipidemia   . Rotator cuff tear     left  . History of ulcer disease     many yrs ago  . History of transfusion     Past Surgical History  Procedure Date  . Carpal tunnel release 09/17/2011    Procedure: CARPAL TUNNEL RELEASE;  Surgeon: Magnus Sinning, MD;  Location: [place];  Service: Orthopedics;  Laterality: Right;  . Abdominal hysterectomy 1966  . Transthoracic echocardiogram 12-26-2008  DR Daneen Schick    NORMAL LVF/  EF  71%/   MILD ASYMMETRIC SEPTAL HYPERTROPHY/ MILD LEFT ATRIAL ENLARGEMENT/ MODERATELY ELEVATED ESTIMATED RIGHT VENTRICULAR SYSTOLIC PRESSURE/ MILD MITRAL  &  TRICUSPID VALVE REGURG.  . Cardiovascular stress test 06-18-2005   DR Daneen Schick    NORMAL STUDY/ NO EVIDENCE ISCHEMIA/ EF 75%  . Carpal tunnel release 10/22/2011    Procedure: CARPAL TUNNEL RELEASE;  Surgeon: Magnus Sinning, MD;  Location: [place];  Service: Orthopedics;  Laterality: Left;  . Lumbar laminectomy/decompression microdiscectomy 01/14/2012    Procedure: LUMBAR LAMINECTOMY/DECOMPRESSION MICRODISCECTOMY 3 LEVELS;  Surgeon: Magnus Sinning, MD;  Location: [facility];  Service: Orthopedics;  Laterality: N/A;   Decompression Laminectomy of L2 - L3, L3 - L4 and L4 - L5 Central  (X-Ray)  . Appendectomy     History reviewed. No pertinent family history. Social History:  reports that she quit smoking about 44 years ago. Her smoking use included Cigarettes. She has a 30 pack-year smoking history. She has never used smokeless tobacco. She reports that she does not drink alcohol or use illicit drugs.  Allergies: No Known Allergies  Medications Prior to Admission  Medication Sig Dispense Refill  . allopurinol (ZYLOPRIM) 300 MG tablet Take 300 mg by mouth daily with breakfast.       . amLODipine (NORVASC) 10 MG tablet Take 10 mg by mouth every morning.       Marland Kitchen b complex vitamins tablet Take 1 tablet by mouth daily.      Marland Kitchen glipiZIDE (GLUCOTROL) 10 MG tablet Take 10 mg by mouth daily before breakfast.       . hydrALAZINE (APRESOLINE) 10 MG tablet Take 10 mg by mouth daily with breakfast.       . lisinopril (PRINIVIL,ZESTRIL) 40 MG tablet Take 40 mg by mouth every morning.       . meloxicam (MOBIC) 15 MG tablet Take 15 mg by mouth daily.      . metoprolol (LOPRESSOR) 50 MG tablet Take 25 mg by mouth daily before breakfast. Only takes 1/2  tablet in the morning      . pravastatin (PRAVACHOL) 20 MG tablet Take 20 mg by mouth every evening.      . probenecid (BENEMID) 500 MG tablet Take 500 mg by mouth daily.       . ranitidine (ZANTAC) 150 MG tablet Take 150 mg by mouth daily.       . timolol (BETIMOL) 0.25 % ophthalmic solution Place 1 drop into the right eye 2 (two) times daily.       Marland Kitchen aspirin 81 MG tablet Take 81 mg by mouth daily.         Results for orders placed during the hospital encounter of 06/02/12 (from the past 48 hour(s))  GLUCOSE, CAPILLARY     Status: Abnormal   Collection Time   06/02/12  9:52 AM      Component Value Range Comment   Glucose-Capillary 104 (*) 70 - 99 mg/dL    No results found.  ROS  Blood pressure 148/65, pulse 54, temperature 98.4 F (36.9 C), resp. rate 20, SpO2  100.00%. Physical Exam  Constitutional: She is oriented to person, place, and time. She appears well-developed and well-nourished.  HENT:  Head: Normocephalic and atraumatic.  Right Ear: External ear normal.  Left Ear: External ear normal.  Nose: Nose normal.  Mouth/Throat: Oropharynx is clear and moist.  Eyes: Conjunctivae normal and EOM are normal. Pupils are equal, round, and reactive to light.  Neck: Normal range of motion. Neck supple.  Cardiovascular: Normal rate, regular rhythm, normal heart sounds and intact distal pulses.   Respiratory: Effort normal and breath sounds normal.  GI: Soft. Bowel sounds are normal.  Musculoskeletal: Normal range of motion. She exhibits tenderness.       Tender ac joint and rotator cuff lt [place]  Neurological: She is alert and oriented to person, place, and time. She has normal reflexes.  Skin: Skin is warm and dry.  Psychiatric: She has a normal mood and affect. Her behavior is normal. Judgment and thought content normal.     Assessment/Plan Torn rotator cuff and ac arthritis lt shoulder Open anterior acromionectomy and rotator cuff repair;open distal clavicle resection lt shoulder  Deaunte Dente P 06/02/2012, 10:46 AM

## 2012-06-02 NOTE — Anesthesia Preprocedure Evaluation (Signed)
Anesthesia Evaluation  Patient identified by MRN, date of birth, ID band Patient awake    Reviewed: Allergy & Precautions, H&P , NPO status , Patient's Chart, lab work & pertinent test results  History of Anesthesia Complications Negative for: history of anesthetic complications  Airway Mallampati: II TM Distance: >3 FB Neck ROM: Full    Dental No notable dental hx. (+) Upper Dentures and Dental Advisory Given   Pulmonary neg pulmonary ROS,  breath sounds clear to auscultation  Pulmonary exam normal       Cardiovascular hypertension, Pt. on medications + Valvular Problems/Murmurs Rhythm:Regular Rate:Normal     Neuro/Psych  Neuromuscular disease negative psych ROS   GI/Hepatic Neg liver ROS, GERD-  Medicated,  Endo/Other  diabetes, Type 2, Oral Hypoglycemic Agents  Renal/GU negative Renal ROS  negative genitourinary   Musculoskeletal negative musculoskeletal ROS (+)   Abdominal   Peds  Hematology negative hematology ROS (+)   Anesthesia Other Findings   Reproductive/Obstetrics negative OB ROS                           Anesthesia Physical Anesthesia Plan  ASA: II  Anesthesia Plan: General   Post-op Pain Management:    Induction: Intravenous  Airway Management Planned: Oral ETT  Additional Equipment:   Intra-op Plan:   Post-operative Plan: Extubation in OR  Informed Consent: I have reviewed the patients History and Physical, chart, labs and discussed the procedure including the risks, benefits and alternatives for the proposed anesthesia with the patient or authorized representative who has indicated his/her understanding and acceptance.   Dental advisory given  Plan Discussed with: CRNA  Anesthesia Plan Comments:         Anesthesia Quick Evaluation

## 2012-06-02 NOTE — Transfer of Care (Signed)
Immediate Anesthesia Transfer of Care Note  Patient: [name]  Procedure(s) Performed: Procedure(s) (LRB) with comments: ROTATOR CUFF REPAIR SHOULDER OPEN (Left) - Left Shoulder Open Distal Clavicle Resection Anterior Acrominectomy and Rotator Cuff Repair  Patient Location: PACU  Anesthesia Type:General and Regional  Level of Consciousness: awake, alert , oriented and patient cooperative  Airway & Oxygen Therapy: Patient Spontanous Breathing and Patient connected to face mask oxygen  Post-op Assessment: Report given to PACU RN and Post -op Vital signs reviewed and stable  Post vital signs: Reviewed and stable  Complications: No apparent anesthesia complications

## 2012-06-02 NOTE — Anesthesia Procedure Notes (Signed)
Anesthesia Regional Block:  Interscalene brachial plexus block  Pre-Anesthetic Checklist: ,, timeout performed, Correct Patient, Correct Site, Correct Laterality, Correct Procedure, Correct Position, site marked, Risks and benefits discussed,  Surgical consent,  Pre-op evaluation,  At surgeon's request and post-op pain management   Prep: chloraprep       Needles:  Injection technique: Single-shot  Needle Type: Stimiplex          Additional Needles:  Procedures: ultrasound guided (picture in chart) and nerve stimulator Interscalene brachial plexus block Narrative:  Start time: 06/02/2012 11:10 AM End time: 06/02/2012 11:18 AM Injection made incrementally with aspirations every 5 mL.  Performed by: Personally  Anesthesiologist: A Chamberlain Steinborn MD  Additional Notes: Risks, benefits and alternative to block explained extensively.  Patient tolerated procedure well, without complications.  Interscalene brachial plexus block

## 2012-06-03 ENCOUNTER — Encounter (HOSPITAL_COMMUNITY): Payer: Self-pay | Admitting: Orthopedic Surgery

## 2012-06-03 ENCOUNTER — Inpatient Hospital Stay
Admission: RE | Admit: 2012-06-03 | Discharge: 2012-07-05 | Disposition: A | Discharge status: Home / Self Care | Payer: Medicare Other | Source: Ambulatory Visit | Attending: Internal Medicine | Admitting: Internal Medicine

## 2012-06-03 MED ORDER — METHOCARBAMOL 500 MG PO TABS: 500.0000 mg | ORAL_TABLET | Freq: Four times a day (QID) | ORAL | Status: DC | PRN

## 2012-06-03 MED ORDER — HYDROCODONE-ACETAMINOPHEN 5-325 MG PO TABS: 1.0000 | ORAL_TABLET | ORAL | Status: DC | PRN

## 2012-06-03 NOTE — Progress Notes (Signed)
Pt to be d/c to [facility] today. [facility] has been contacted for transport. Prior authorization has been provided by [facility] for SNF / Ambulance transport.  Roselyn Reef Susan Arana LCSW [phone]

## 2012-06-03 NOTE — Progress Notes (Signed)
Clinical Social Work Department BRIEF PSYCHOSOCIAL ASSESSMENT 06/03/2012  Patient:  [name], [name]     Account Number:  [id number]     Admit date:  06/02/2012  Clinical Social Worker:  Lacie Scotts  Date/Time:  06/03/2012 01:05 PM  Referred by:  Physician  Date Referred:  06/03/2012 Referred for  SNF Placement   Other Referral:   Interview type:  Patient Other interview type:    PSYCHOSOCIAL DATA Living Status:  FAMILY Admitted from facility:   Level of care:   Primary support name:  Domenica Fail Primary support relationship to patient:  CHILD, ADULT Degree of support available:   supportive    CURRENT CONCERNS Current Concerns  Post-Acute Placement   Other Concerns:    SOCIAL WORK ASSESSMENT / PLAN  Assessment/plan status:   Other assessment/ plan:   Pt is an 79 yr old female living at home prior to hospitalization. CSW met with pt/family to assist with d/c planning. ST SNF placement is needed following hospital d/c. Pt / family have requested [facility] for ST Rehab. SNF contacted and is able to provide placement today. [facility] Medicare contacted and has provided prior authorization for SNF and ambulance transport. CSW will assist with d/c planning to SNF today.   Information/referral to community resources:   None needed at this time.    PATIENT'S/FAMILY'S RESPONSE TO PLAN OF CARE: Pt / family are very pleased that [facility] is able to provide ST Rehab placement.   Werner Lean LCSW [phone]

## 2012-06-03 NOTE — Plan of Care (Signed)
Problem: Phase I Progression Outcomes Goal: OOB as tolerated unless otherwise ordered Outcome: Completed/Met Date Met:  06/03/12 Pt oob w/ 2 person assistance- pt unsteady and worried about falling.  BSC close to bed; pt stood at side of bed w/ asst and pivoted to [facility].  Monitored pt closely while oob.  Pt's family in room at all times

## 2012-06-03 NOTE — Evaluation (Addendum)
Occupational Therapy Evaluation Patient Details Name: [name] MRN: [id number] DOB: 08/11/1923 Today's Date: 06/03/2012 Time: 0925-1000 OT Time Calculation (min): 35 min  OT Assessment / Plan / Recommendation Clinical Impression  Pt is s/p L shoulder surgery and displays minimal pain with activity today but is limited with ADL (however required much assist PTA also) and will benefit from OT services to provide pt and caregiver education for L shoulder care. Will follow on acute.    OT Assessment  Patient needs continued OT Services    Follow Up Recommendations  SNF    Barriers to Discharge      Equipment Recommendations  None recommended by OT    Recommendations for Other Services    Frequency  Min 2X/week    Precautions / Restrictions Precautions Precautions: Shoulder Precaution Booklet Issued: Yes (comment) Precaution Comments: Issued shoulder care handout and reviewed with pt and daughters Required Braces or Orthoses: Other Brace/Splint Other Brace/Splint: Left shoulder immobilizer Restrictions Weight Bearing Restrictions: Yes LUE Weight Bearing: Non weight bearing        ADL       OT Diagnosis: Generalized weakness  OT Problem List: Decreased strength;Decreased range of motion;Decreased knowledge of use of DME or AE;Decreased knowledge of precautions OT Treatment Interventions: Self-care/ADL training;DME and/or AE instruction;Patient/family education;Therapeutic exercise   OT Goals Acute Rehab OT Goals OT Goal Formulation: With patient/family Time For Goal Achievement: 06/10/12 Potential to Achieve Goals: Good ADL Goals Additional ADL Goal #1: Pt/famly will be independent (or pt mod assist) with bathing and dressing including donning/doffing sling for L UE without moving shoulder.  ADL Goal: Additional Goal #1 - Progress: Goal set today Additional ADL Goal #2: Pt and family will independently verbalize correct positioning techniques for L shoulder when in  bed/chair.  ADL Goal: Additional Goal #2 - Progress: Goal set today Arm Goals Pt Will Perform AROM: Other (comment);with minimal assist;Left upper extremity;10 reps (elbow, wrist and digit flexion/extension for L UE) Arm Goal: AROM - Progress: Goal set today  Visit Information  Last OT Received On: 06/03/12 Assistance Needed: +1    Subjective Data  Subjective: I have had falls at home Patient Stated Goal: wants to get stronger   Prior Functioning     Home Living Lives With: Daughter Available Help at Discharge: Family Type of Home: House Home Access: Stairs to enter Technical brewer of Steps: 3 Home Layout: One level [place]: Walker - rolling;Wheelchair - manual;Straight cane;Bedside commode/3-in-1;Quad cane Prior Function Level of Independence: Needs assistance Needs Assistance: Bathing;Dressing;Light Housekeeping;Meal Prep Bath: Maximal Dressing: Maximal Meal Prep: Total Light Housekeeping: Total Gait Assistance: used RW to amb prior to shoulder surgery Comments: daughter and pt state she only washed her face and daughter helped with rest of bath and much of dressing PTA. She sponges some days and showers others. Pt reports having multiple falls at home. Communication Communication: No difficulties         Vision/Perception     Cognition  Overall Cognitive Status: Appears within functional limits for tasks assessed/performed Arousal/Alertness: Awake/alert Orientation Level: Appears intact for tasks assessed Behavior During Session: [facility] for tasks performed    Extremity/Trunk Assessment Right Lower Extremity Assessment RLE ROM/Strength/Tone: Deficits RLE ROM/Strength/Tone Deficits: ankle WFL grossly; knee ext 3/5, knee flexion 3+/5; hip grossly 3 to 3+/5 Left Lower Extremity Assessment LLE ROM/Strength/Tone: Deficits LLE ROM/Strength/Tone Deficits: ankle grossly WFL; knee ext 3/5, knee flexion 3+/5; hip grossly 3 to 3+/5          Shoulder  Instructions Donning/doffing shirt without moving shoulder: Maximal assistance Method for sponge bathing under operated UE: Maximal assistance Donning/doffing sling/immobilizer: Maximal assistance Correct positioning of sling/immobilizer:  (pt and daugthers verbalize understanding) ROM for elbow, wrist and digits of operated UE:  (pt performed wrist and digit ROM today) Sling wearing schedule (on at all times/off for ADL's):  (pt and daughters verbalize understanding) Proper positioning of operated UE when showering:  (pt and daugthers verbalize understanding) Positioning of UE while sleeping:  (pt and daughters verbalize understanding)   Exercise     Balance Static Sitting Balance Static Sitting - Balance Support: No upper extremity supported;Feet supported Static Sitting - Level of Assistance: 5: Stand by assistance Dynamic Standing Balance Dynamic Standing - Balance Support: Right upper extremity supported;During functional activity Dynamic Standing - Level of Assistance: 4: Min assist   End of Session OT - End of Session Activity Tolerance: Patient tolerated treatment well Patient left: in chair;with call bell/phone within reach;with family/visitor present  GO Functional Assessment Tool Used: clinical judgement Functional Limitation: Self care Self Care Current Status [id number]): At least 60 percent but less than 80 percent impaired, limited or restricted Self Care Goal Status [id number]): At least 40 percent but less than 60 percent impaired, limited or restricted   Jules Schick [id number] 06/03/2012, 10:23 AM

## 2012-06-03 NOTE — Progress Notes (Signed)
Patient ID: [name], female   DOB: Jun 17, 1923, 79 y.o.   MRN: [id number] She has no post-op complications but did not inform me pr-op that she required a walker for ambulation--consequently will send to [facility] where a bed is available.

## 2012-06-03 NOTE — Evaluation (Signed)
Physical Therapy Evaluation Patient Details Name: [name] MRN: [id number] DOB: 05/29/1923 Today's Date: 06/03/2012 Time: [id number] PT Time Calculation (min): 29 min  PT Assessment / Plan / Recommendation Clinical Impression  pt s/p left shoulder surgery and presents with generalized weakness, acitvity tol and balance defictis, will benefit from PT this venue as well as post acute rehab to maximize independence and safety    PT Assessment  Patient needs continued PT services    Follow Up Recommendations  SNF    Does the patient have the potential to tolerate intense rehabilitation      Barriers to Discharge        Equipment Recommendations  Other (comment) (hemiwalker)    Recommendations for Other Services     Frequency Min 4X/week    Precautions / Restrictions Precautions Precautions: Shoulder Required Braces or Orthoses: Other Brace/Splint Other Brace/Splint: left shoulder immobilizer Restrictions Weight Bearing Restrictions: No LUE Weight Bearing: Non weight bearing   Pertinent Vitals/Pain       Mobility  Bed Mobility Bed Mobility: Supine to Sit Supine to Sit: 3: Mod assist;HOB elevated Details for Bed Mobility Assistance: cues for technique, bed pad used to assist with scooting Transfers Transfers: Sit to Stand;Stand to Sit Sit to Stand: 3: Mod assist;2: Max assist;From bed Stand to Sit: 4: Min assist;To chair/3-in-1;With upper extremity assist Details for Transfer Assistance: cues for safety and hand placement; left knee support to come to stand; pt reports that knees "give way" at home Ambulation/Gait Ambulation/Gait Assistance: 4: Min assist Ambulation Distance (Feet): 5 Feet Assistive device: Hemi-walker Ambulation/Gait Assistance Details: assist for balance, multimodal cues for tehcnique, hemiwalker safety Gait Pattern: Step-to pattern General Gait Details: unsteady    Shoulder Instructions     Exercises     PT Diagnosis: Difficulty  walking;Generalized weakness  PT Problem List: Decreased strength;Decreased range of motion;Decreased knowledge of use of DME;Decreased balance;Decreased activity tolerance;Decreased mobility;Decreased safety awareness;Decreased knowledge of precautions PT Treatment Interventions: DME instruction;Gait training;Functional mobility training;Therapeutic activities;Therapeutic exercise;Patient/family education;Balance training   PT Goals Acute Rehab PT Goals PT Goal Formulation: With patient Time For Goal Achievement: 06/10/12 Potential to Achieve Goals: Good Pt will go Supine/Side to Sit: with min assist PT Goal: Supine/Side to Sit - Progress: Goal set today Pt will go Sit to Supine/Side: with min assist PT Goal: Sit to Supine/Side - Progress: Goal set today Pt will go Sit to Stand: with min assist PT Goal: Sit to Stand - Progress: Goal set today Pt will go Stand to Sit: with supervision PT Goal: Stand to Sit - Progress: Goal set today Pt will Transfer Bed to Chair/Chair to Bed: with supervision PT Transfer Goal: Bed to Chair/Chair to Bed - Progress: Goal set today Pt will Ambulate: 16 - 50 feet;with min assist;with least restrictive assistive device PT Goal: Ambulate - Progress: Goal set today  Visit Information  Last PT Received On: 06/03/12    Subjective Data  Subjective: what about if I go home? Patient Stated Goal: home or rehab    Prior Functioning  Home Living Lives With: Daughter Available Help at Discharge: Family Type of Home: House Home Access: Stairs to enter Technical brewer of Steps: 3 Home Layout: One level [place]: Walker - rolling;Wheelchair - manual;Straight cane;Bedside commode/3-in-1;Quad cane Prior Function Level of Independence: Needs assistance Needs Assistance: Bathing;Dressing;Light Housekeeping;Meal Prep Gait Assistance: used RW to amb prior to shoulder surgery Comments: daughter assists Communication Communication: No  difficulties    Cognition  Overall Cognitive Status: Appears within  functional limits for tasks assessed/performed Arousal/Alertness: Awake/alert Orientation Level: Appears intact for tasks assessed Behavior During Session: [facility] for tasks performed    Extremity/Trunk Assessment Right Lower Extremity Assessment RLE ROM/Strength/Tone: Deficits RLE ROM/Strength/Tone Deficits: ankle WFL grossly; knee ext 3/5, knee flexion 3+/5; hip grossly 3 to 3+/5 Left Lower Extremity Assessment LLE ROM/Strength/Tone: Deficits LLE ROM/Strength/Tone Deficits: ankle grossly WFL; knee ext 3/5, knee flexion 3+/5; hip grossly 3 to 3+/5   Balance Static Sitting Balance Static Sitting - Balance Support: No upper extremity supported;Feet supported Static Sitting - Level of Assistance: 5: Stand by assistance Dynamic Standing Balance Dynamic Standing - Balance Support: Right upper extremity supported;During functional activity Dynamic Standing - Level of Assistance: 4: Min assist  End of Session PT - End of Session Equipment Utilized During Treatment: Gait belt;Other (comment) (left shoulder immobilizer) Activity Tolerance: Patient tolerated treatment well Patient left: in chair;with call bell/phone within reach;with nursing in room;with family/visitor present Nurse Communication: Mobility status  GP Functional Assessment Tool Used: clinical judgement Functional Limitation: Mobility: Walking and moving around Mobility: Walking and Moving Around Current Status [id number]): At least 40 percent but less than 60 percent impaired, limited or restricted Mobility: Walking and Moving Around Goal Status [phone]): At least 20 percent but less than 40 percent impaired, limited or restricted   [facility] 06/03/2012, 9:37 AM

## 2012-06-03 NOTE — Op Note (Signed)
[name], [name]                ACCOUNT NO.:  [id number]  MEDICAL RECORD NO.:  [id number]  LOCATION:  [id number]                         FACILITY:  [facility]  PHYSICIAN:  Tarri Glenn, M.D.  DATE OF BIRTH:  07-26-1923  DATE OF PROCEDURE:  06/02/2012 DATE OF DISCHARGE:                              OPERATIVE REPORT   PREOPERATIVE DIAGNOSES: 1. AC joint arthritis. 2. Torn rotator cuff with retraction, left shoulder.  POSTOPERATIVE DIAGNOSES: 1. AC joint arthritis. 2. Torn rotator cuff with retraction, left shoulder.  OPERATIONS: 1. Open anterior acromionectomy and repair of torn rotator cuff. 2. Open distal clavicle resection.  SURGEON:  Tarri Glenn, M.D.  ASSISTANTElodia Florence. Vanita Ingles.  ANESTHESIA:  Interscalene block followed by general anesthetic.  DESCRIPTION OF PROCEDURE:  Satisfactory general anesthesia preceded by interscalene block, placed on the Allen frame in the beach-chair position.  Left shoulder girdle was prepped with DuraPrep, draped in sterile field.  Time-out performed.  I made an incision along the distal clavicle curving downward over the rotator cuff.  The distal clavicle was isolated with subperiosteal dissection, and I measured 1.5 cm from it and by protecting the underlying structures with baby Bennett, I used a micro saw to amputate the clavicle at this point.  There were no residual spicules of bone.  I covered the cut end of the clavicle with bone wax.  I then continued the dissection more distalward isolating the anterior acromion which I undermined with a Cobb elevator and made my initial anterior acromionectomy with a micro saw.  She had a good bit of impingement and I ended doing an additional underneath acromion cut to completely decompress the subacromial arch.  She had minimal bursa.  The rotator cuff was identified as being thinned out with about a 2 cm wide and 2 cm retraction tear, but long head of biceps tendon, keeping with the  MRI, was not torn.  The rotator cuff was very freely movable.  I then used a rongeur to roughen up the greater tuberosity articular cartilage down to bone.  I then used a single Stryker 4 stranded anchor and I then moved the 4 strands across the width of the tear with good purchase because of the thinness of the rotator cuff.  I then cinched these 2 separate sutures down and then made an additional second row going to the distal rotator cuff, and the periosteal tissue over the proximal humerus.  This gave a very nice stable repair with arm down to her side.  I then irrigated the wound well with sterile saline.  Gelfoam was placed in the distal clavicle resection site and the wound closed with interrupted #1 Vicryl in the fascia over the distal clavicle and acromion, 2-0 Vicryl to subcutaneous tissue, staples in the skin.  Betadine, Adaptic, dry sterile dressing were applied.  She was placed in shoulder immobilizer and taken to the recovery room in satisfactory condition with no known complications.          ______________________________ Tarri Glenn, M.D.     JA/MEDQ  D:  06/02/2012  T:  06/03/2012  Job:  [id number]

## 2012-06-03 NOTE — Progress Notes (Signed)
Clinical Social Work Department CLINICAL SOCIAL WORK PLACEMENT NOTE 06/03/2012  Patient:  [name], [name]  Account Number:  [id number] Admit date:  06/02/2012  Clinical Social Worker:  Werner Lean, LCSW  Date/time:  06/03/2012 01:20 PM  Clinical Social Work is seeking post-discharge placement for this patient at the following level of care:   SKILLED NURSING   (*CSW will update this form in Epic as items are completed)     Patient/family provided with [place] Department of Clinical Social Work's list of facilities offering this level of care within the geographic area requested by the patient (or if unable, by the patient's family).  06/03/2012  Patient/family informed of their freedom to choose among providers that offer the needed level of care, that participate in Medicare, Medicaid or managed care program needed by the patient, have an available bed and are willing to accept the patient.    Patient/family informed of [facility]' ownership interest in [facility], as well as of the fact that they are under no obligation to receive care at this facility.  PASARR submitted to EDS on 06/03/2012 PASARR number received from EDS on 06/03/2012  FL2 transmitted to all facilities in geographic area requested by pt/family on  06/03/2012 FL2 transmitted to all facilities within larger geographic area on   Patient informed that his/her managed care company has contracts with or will negotiate with  certain facilities, including the following:     Patient/family informed of bed offers received:  06/03/2012 Patient chooses bed at [facility] Physician recommends and patient chooses bed at    Patient to be transferred to [facility] on   Patient to be transferred to facility by   The following physician request were entered in Epic:   Additional Comments:  Werner Lean LCSW [phone]

## 2012-06-03 NOTE — Anesthesia Postprocedure Evaluation (Signed)
Anesthesia Post Note  Patient: [name]  Procedure(s) Performed: Procedure(s) (LRB): ROTATOR CUFF REPAIR SHOULDER OPEN (Left)  Anesthesia type: General  Patient location: PACU  Post pain: Pain level controlled  Post assessment: Post-op Vital signs reviewed  Last Vitals:  Filed Vitals:   06/03/12 1041  BP: 109/68  Pulse: 60  Temp: 37.1 C  Resp: 16    Post vital signs: Reviewed  Level of consciousness: sedated  Complications: No apparent anesthesia complications

## 2012-06-03 NOTE — Discharge Summary (Signed)
[name], [name]                ACCOUNT NO.:  [id number]  MEDICAL RECORD NO.:  [id number]  LOCATION:  [id number]                         FACILITY:  [facility]  PHYSICIAN:  Tarri Glenn, M.D.  DATE OF BIRTH:  July 31, 1923  DATE OF ADMISSION:  06/02/2012 DATE OF DISCHARGE:  06/03/2012                              DISCHARGE SUMMARY   ADMITTING DIAGNOSES: 1. Complete rotator cuff tear. 2. Acromioclavicular joint arthritis, left shoulder.  DISCHARGE DIAGNOSES: 1. Complete rotator cuff tear. 2. Acromioclavicular joint arthritis, left shoulder.  OPERATION: 1. On June 02, 2012 open anterior acromionectomy and with repair of     torn rotator cuff. 2. Open distal clavicle resection.  SUMMARY:  This 79 year old female who has had progressive pain in her left shoulder with an MRI demonstrating a complete rotator cuff tear and AC joint arthritis.  Despite her age, she is very youthful. Preoperative lab work was all unremarkable.  The above-mentioned surgery was performed without complication.  We utilized interscalene block. Postoperatively, she had no complications and at this time is ready for discharge, but they had neglected to mention to me preoperatively that she could only ambulate with a walker and clearly this cannot be done with the rotator cuff repair.  Physical therapy did see her, tried to use a cane, but this was not felt to be workable.  Social services has seen her and there is a bed available and [facility].  She will be discharged there with a plan to have her take care preoperative preadmission medications plus Norco 5-325 and Flexeril 10 mg for pain, to return to my office on 06/07/2012 for wound inspection and debriding. She should be discharge with no stress on her left shoulder and should be using the shoulder immobilizer most of the time particularly when sleeping.  Condition at discharge is stable and improved.          ______________________________ Tarri Glenn, M.D.     JA/MEDQ  D:  06/03/2012  T:  06/03/2012  Job:  [id number]

## 2012-06-04 LAB — GLUCOSE, CAPILLARY
Comment 1: 302161
Glucose-Capillary: 113 mg/dL — ABNORMAL HIGH (ref 70–99)
Glucose-Capillary: 177 mg/dL — ABNORMAL HIGH (ref 70–99)

## 2012-06-04 NOTE — Progress Notes (Signed)
Discharge summary sent to payer through [employer]  

## 2012-06-04 NOTE — Progress Notes (Signed)
Clinical Social Work Department CLINICAL SOCIAL WORK PLACEMENT NOTE 06/04/2012  Patient:  [name], [name]  Account Number:  [id number] Admit date:  06/02/2012  Clinical Social Worker:  Werner Lean, LCSW  Date/time:  06/03/2012 01:20 PM  Clinical Social Work is seeking post-discharge placement for this patient at the following level of care:   SKILLED NURSING   (*CSW will update this form in Epic as items are completed)     Patient/family provided with [place] Department of Clinical Social Work's list of facilities offering this level of care within the geographic area requested by the patient (or if unable, by the patient's family).  06/03/2012  Patient/family informed of their freedom to choose among providers that offer the needed level of care, that participate in Medicare, Medicaid or managed care program needed by the patient, have an available bed and are willing to accept the patient.    Patient/family informed of [facility]' ownership interest in [facility], as well as of the fact that they are under no obligation to receive care at this facility.  PASARR submitted to EDS on 06/03/2012 PASARR number received from EDS on 06/03/2012  FL2 transmitted to all facilities in geographic area requested by pt/family on  06/03/2012 FL2 transmitted to all facilities within larger geographic area on   Patient informed that his/her managed care company has contracts with or will negotiate with  certain facilities, including the following:     Patient/family informed of bed offers received:  06/03/2012 Patient chooses bed at [facility] Physician recommends and patient chooses bed at    Patient to be transferred to [facility] on  06/03/2012 Patient to be transferred to facility by [facility]  The following physician request were entered in Epic:   Additional Comments:  Werner Lean LCSW [phone]

## 2012-06-05 LAB — GLUCOSE, CAPILLARY
Glucose-Capillary: 100 mg/dL — ABNORMAL HIGH (ref 70–99)
Glucose-Capillary: 142 mg/dL — ABNORMAL HIGH (ref 70–99)

## 2012-06-06 LAB — GLUCOSE, CAPILLARY
Glucose-Capillary: 101 mg/dL — ABNORMAL HIGH (ref 70–99)
Glucose-Capillary: 146 mg/dL — ABNORMAL HIGH (ref 70–99)
Glucose-Capillary: 152 mg/dL — ABNORMAL HIGH (ref 70–99)

## 2012-06-07 LAB — GLUCOSE, CAPILLARY
Glucose-Capillary: 114 mg/dL — ABNORMAL HIGH (ref 70–99)
Glucose-Capillary: 150 mg/dL — ABNORMAL HIGH (ref 70–99)

## 2012-06-08 LAB — GLUCOSE, CAPILLARY: Glucose-Capillary: 159 mg/dL — ABNORMAL HIGH (ref 70–99)

## 2012-06-09 LAB — GLUCOSE, CAPILLARY
Glucose-Capillary: 124 mg/dL — ABNORMAL HIGH (ref 70–99)
Glucose-Capillary: 98 mg/dL (ref 70–99)

## 2012-06-10 LAB — GLUCOSE, CAPILLARY
Glucose-Capillary: 131 mg/dL — ABNORMAL HIGH (ref 70–99)
Glucose-Capillary: 143 mg/dL — ABNORMAL HIGH (ref 70–99)

## 2012-06-11 LAB — GLUCOSE, CAPILLARY
Glucose-Capillary: 119 mg/dL — ABNORMAL HIGH (ref 70–99)
Glucose-Capillary: 59 mg/dL — ABNORMAL LOW (ref 70–99)

## 2012-06-12 LAB — GLUCOSE, CAPILLARY
Glucose-Capillary: 186 mg/dL — ABNORMAL HIGH (ref 70–99)
Glucose-Capillary: 93 mg/dL (ref 70–99)

## 2012-06-13 LAB — GLUCOSE, CAPILLARY
Glucose-Capillary: 122 mg/dL — ABNORMAL HIGH (ref 70–99)
Glucose-Capillary: 107 mg/dL — ABNORMAL HIGH (ref 70–99)

## 2012-06-14 LAB — GLUCOSE, CAPILLARY
Glucose-Capillary: 123 mg/dL — ABNORMAL HIGH (ref 70–99)
Glucose-Capillary: 83 mg/dL (ref 70–99)
Glucose-Capillary: 150 mg/dL — ABNORMAL HIGH (ref 70–99)
Glucose-Capillary: 155 mg/dL — ABNORMAL HIGH (ref 70–99)

## 2012-06-15 LAB — GLUCOSE, CAPILLARY: Glucose-Capillary: 125 mg/dL — ABNORMAL HIGH (ref 70–99)

## 2012-06-16 LAB — GLUCOSE, CAPILLARY: Glucose-Capillary: 167 mg/dL — ABNORMAL HIGH (ref 70–99)

## 2012-06-17 LAB — GLUCOSE, CAPILLARY: Glucose-Capillary: 118 mg/dL — ABNORMAL HIGH (ref 70–99)

## 2012-06-19 LAB — GLUCOSE, CAPILLARY: Glucose-Capillary: 150 mg/dL — ABNORMAL HIGH (ref 70–99)

## 2012-06-20 LAB — GLUCOSE, CAPILLARY: Glucose-Capillary: 115 mg/dL — ABNORMAL HIGH (ref 70–99)

## 2012-06-21 LAB — GLUCOSE, CAPILLARY
Glucose-Capillary: 125 mg/dL — ABNORMAL HIGH (ref 70–99)
Glucose-Capillary: 159 mg/dL — ABNORMAL HIGH (ref 70–99)

## 2012-06-22 LAB — GLUCOSE, CAPILLARY: Glucose-Capillary: 100 mg/dL — ABNORMAL HIGH (ref 70–99)

## 2012-06-23 LAB — GLUCOSE, CAPILLARY
Glucose-Capillary: 115 mg/dL — ABNORMAL HIGH (ref 70–99)
Glucose-Capillary: 87 mg/dL (ref 70–99)

## 2012-06-25 LAB — GLUCOSE, CAPILLARY: Glucose-Capillary: 143 mg/dL — ABNORMAL HIGH (ref 70–99)

## 2012-06-26 LAB — GLUCOSE, CAPILLARY
Glucose-Capillary: 102 mg/dL — ABNORMAL HIGH (ref 70–99)
Glucose-Capillary: 140 mg/dL — ABNORMAL HIGH (ref 70–99)

## 2012-06-27 LAB — GLUCOSE, CAPILLARY
Glucose-Capillary: 102 mg/dL — ABNORMAL HIGH (ref 70–99)
Glucose-Capillary: 102 mg/dL — ABNORMAL HIGH (ref 70–99)

## 2012-06-28 LAB — GLUCOSE, CAPILLARY: Glucose-Capillary: 117 mg/dL — ABNORMAL HIGH (ref 70–99)

## 2012-06-29 LAB — GLUCOSE, CAPILLARY: Glucose-Capillary: 113 mg/dL — ABNORMAL HIGH (ref 70–99)

## 2012-07-02 LAB — GLUCOSE, CAPILLARY: Glucose-Capillary: 147 mg/dL — ABNORMAL HIGH (ref 70–99)

## 2012-07-03 LAB — GLUCOSE, CAPILLARY
Glucose-Capillary: 154 mg/dL — ABNORMAL HIGH (ref 70–99)
Glucose-Capillary: 98 mg/dL (ref 70–99)

## 2012-08-17 ENCOUNTER — Ambulatory Visit (HOSPITAL_COMMUNITY): Payer: Medicare Other | Admitting: Specialist

## 2012-08-24 ENCOUNTER — Ambulatory Visit (HOSPITAL_COMMUNITY)
Admission: RE | Admit: 2012-08-24 | Discharge: 2012-08-24 | Disposition: A | Discharge status: Home / Self Care | Payer: Medicare Other | Source: Ambulatory Visit | Attending: Orthopedic Surgery | Admitting: Orthopedic Surgery

## 2012-08-24 DIAGNOSIS — IMO0001 Reserved for inherently not codable concepts without codable children: Secondary | ICD-10-CM | POA: Insufficient documentation

## 2012-08-24 DIAGNOSIS — R262 Difficulty in walking, not elsewhere classified: Secondary | ICD-10-CM | POA: Insufficient documentation

## 2012-08-24 DIAGNOSIS — M6281 Muscle weakness (generalized): Secondary | ICD-10-CM | POA: Insufficient documentation

## 2012-08-24 DIAGNOSIS — M25512 Pain in left shoulder: Secondary | ICD-10-CM

## 2012-08-24 DIAGNOSIS — Z9181 History of falling: Secondary | ICD-10-CM | POA: Insufficient documentation

## 2012-08-24 DIAGNOSIS — M25619 Stiffness of unspecified shoulder, not elsewhere classified: Secondary | ICD-10-CM | POA: Insufficient documentation

## 2012-08-24 DIAGNOSIS — Z9889 Other specified postprocedural states: Secondary | ICD-10-CM | POA: Insufficient documentation

## 2012-08-24 DIAGNOSIS — M25519 Pain in unspecified shoulder: Secondary | ICD-10-CM | POA: Insufficient documentation

## 2012-08-24 DIAGNOSIS — R29898 Other symptoms and signs involving the musculoskeletal system: Secondary | ICD-10-CM | POA: Insufficient documentation

## 2012-08-24 NOTE — Evaluation (Signed)
Occupational Therapy Evaluation  Patient Details  Name: [name] MRN: [id number] Date of Birth: 06-Jul-1923  Today's Date: 08/24/2012 Time: 1425-1510 OT Time Calculation (min): 45 min OT Evaluation 62-1445 20' Manual Therapy [id number] 20' Visit#: 1 of 16  Re-eval: 09/21/12   Diagnosis: S/P Left RCR  Surgical Date: 06/02/12 Prior Therapy: SNF and HH  Authorization: [employer]  Authorization Time Period: before 10th visit  Authorization Visit#: 1 of 10   Past Medical History:  Past Medical History  Diagnosis Date  . Hypertension   . GERD (gastroesophageal reflux disease)   . Arthritis   . Slipped intervertebral disc   . Bilateral carpal tunnel syndrome   . Glaucoma(365)   . Cataracts, both eyes   . 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 Diabetes mellitus ORAL MED  . Gout   . Diastolic heart failure CARDIOLOGIST- DR Daneen Schick---  LAST VISIT NOTE W/ CHART  . Heart murmur   . History of falling RECENT FALL 1 WK AGO--   NO INJURY  . Right leg weakness   . Walker as ambulation aid   . At risk for falling   . Diabetic neuropathy both hands and legs  . Hyperlipidemia   . Rotator cuff tear     left  . History of ulcer disease     many yrs ago  . History of transfusion    Past Surgical History:  Past Surgical History  Procedure Laterality Date  . Carpal tunnel release  09/17/2011    Procedure: CARPAL TUNNEL RELEASE;  Surgeon: Magnus Sinning, MD;  Location: [facility];  Service: Orthopedics;  Laterality: Right;  . Abdominal hysterectomy  1966  . Transthoracic echocardiogram  12-26-2008  DR Daneen Schick    NORMAL LVF/  EF  71%/   MILD ASYMMETRIC SEPTAL HYPERTROPHY/ MILD LEFT ATRIAL ENLARGEMENT/ MODERATELY ELEVATED ESTIMATED RIGHT VENTRICULAR SYSTOLIC PRESSURE/ MILD MITRAL  &  TRICUSPID VALVE REGURG.  . Cardiovascular stress test  06-18-2005   DR Daneen Schick    NORMAL STUDY/ NO EVIDENCE ISCHEMIA/ EF 75%  . Carpal tunnel  release  10/22/2011    Procedure: CARPAL TUNNEL RELEASE;  Surgeon: Magnus Sinning, MD;  Location: [facility];  Service: Orthopedics;  Laterality: Left;  . Lumbar laminectomy/decompression microdiscectomy  01/14/2012    Procedure: LUMBAR LAMINECTOMY/DECOMPRESSION MICRODISCECTOMY 3 LEVELS;  Surgeon: Magnus Sinning, MD;  Location: [facility];  Service: Orthopedics;  Laterality: N/A;  Decompression Laminectomy of L2 - L3, L3 - L4 and L4 - L5 Central  (X-Ray)  . Appendectomy    . Shoulder open rotator cuff repair  06/02/2012    Procedure: ROTATOR CUFF REPAIR SHOULDER OPEN;  Surgeon: Magnus Sinning, MD;  Location: [facility];  Service: Orthopedics;  Laterality: Left;  Left Shoulder Open Distal Clavicle Resection Anterior Acrominectomy and Rotator Cuff Repair    Subjective S:  It sure does stay pretty sore. Pertinent History: [name] was a patient of ours for left shoulder pain in November 2013.  She discontinued therapy and had a left rotator cuff repair on 06/02/12.  She was discharged  to a SNF to increase her independence with ambulation.  SHe was discharged home with home health on2/9/14.  SHe recieved home health therapy for her left shoulder from 07/04/12-08/11/12.  She has been referred to occupational therapy for evaluation and treatment, progressing as tolerated.   Limitations: Per MD, ok to begin strengthening exercises.  However, we will begin with PROM, progress to AAROM, AROM, strengthening as pain and shoulder mobility permits.  Special Tests: UEFI scored13/60 = 20% I level Pain Assessment Currently in Pain?: Yes Pain Score:   5 Pain Location: Shoulder Pain Orientation: Left Pain Type: Acute pain  Precautions/Restrictions  Precautions Precautions: Shoulder;Fall  Balance Screening Balance Screen Has the patient fallen in the past 6 months: Yes How many times?: several - patient's left knee gives out without warning.  Therapy at SNF did not seem to improve her leg  stabillity and she declines additional PT at this time.  Has the patient had a decrease in activity level because of a fear of falling? : No Is the patient reluctant to leave their home because of a fear of falling? : No  Prior Functioning  Home Living Lives With: Family Prior Function Level of Independence: Independent with basic ADLs Driving: No Vocation: Retired Comments: Enjoys spending time with her family  Assessment ADL/Vision/Perception ADL ADL Comments: Patient requires assistance with dressing, bathing, grooming. She has difficulty pulling her pants up, pulling the sheets up, and reaching over her head.   Dominant Hand: Right Vision - History Baseline Vision: Wears glasses all the time  Cognition/Observation Cognition Overall Cognitive Status: Appears within functional limits for tasks assessed Observation/Other Assessments Observations: Patient has had bilateral carpal tunnel release surgery in the last year.  Her left surgical scar becomes sore and swollen frequently.  Sensation/Coordination/Edema Sensation Light Touch: Appears Intact Coordination Gross Motor Movements are Fluid and Coordinated: Yes Fine Motor Movements are Fluid and Coordinated: Yes  Additional Assessments LUE AROM (degrees) LUE Overall AROM Comments: assessed in supine with shoulder adducted for external rotation and  Left Shoulder Flexion: 50 Degrees Left Shoulder ABduction: 58 Degrees Left Shoulder Internal Rotation: 68 Degrees Left Shoulder External Rotation: 28 Degrees LUE PROM (degrees) LUE Overall PROM Comments: assessed in supine, external rotation and internal rotation with shoulder adducted Left Shoulder Flexion: 145 Degrees Left Shoulder ABduction: 122 Degrees Left Shoulder Internal Rotation: 68 Degrees Left Shoulder External Rotation: 40 Degrees LUE Strength LUE Overall Strength Comments: strength testing deferred due to patient pain level Palpation Palpation: Moderate/max  pain level.     Exercise/Treatments    Manual Therapy Manual Therapy: Myofascial release Myofascial Release: Myofascial release and manual stretching to left shoulder and scapular region to decrease pain and fascial restrictions and increase pain free mobility.    Occupational Therapy Assessment and Plan OT Assessment and Plan Clinical Impression Statement: A:  Patient is an 79 year old s/p left rotator cuff repair.  She presents with decreased range of motion, strength, and increased pain and fascial restrictions in her left shoulder, which is causing decreased functional use of her LUE with her daily activities.  Pt will benefit from skilled therapeutic intervention in order to improve on the following deficits: Decreased activity tolerance;Decreased range of motion;Decreased strength;Increased muscle spasms;Increased fascial restricitons;Pain Rehab Potential: Good OT Frequency: Min 2X/week OT Treatment/Interventions: Self-care/ADL training;Therapeutic exercise;Modalities;Manual therapy;Therapeutic activities;Patient/family education OT Plan: P: Skilled OT intervention is indicated to increase her AROM and strength and decrease her pain and fascial restrictions in her left shoulder to [facility].  Treatment Plan:  MFR and manual stretching to left shoulder, scapular, and upper arm region.  Supine PROM progressing to AAROM, isometric strengthening.  seated ext, elev, row.  ball stretches, pulleys, progress as tolerated.     Goals Short Term Goals Time to Complete Short Term Goals: 4 weeks Short Term Goal 1: Patient will be educated  on a HEP. Short Term Goal 2: Patient will increase left shoulder PROM to [facility] for increased ability to don and doff clothing independently. Short Term Goal 3: Patient will increase left shoulder strength to 3+/5 for increased ability to push herself out of a chair. Short Term Goal 4: Patient will decrease pain in her left shoulder to 3/10 when combing her hair. Short  Term Goal 5: Patient will decrease fascial restrictions to moderate in her left shoulder region.  Long Term Goals Time to Complete Long Term Goals: 8 weeks Long Term Goal 1: Patient will return to her prior level of independence with her B/IADLs and leisure activiites.  Long Term Goal 2: Patient will increase her left shoulder AROM to [facility] for increased ability to reach into overhead cabinets. Long Term Goal 3: Patient will increase her left shoulder strength to 4+/5 for increased ability to pull up her covers. Long Term Goal 4: Patient will decrease her pain to 2/10 in her left shoulder when completing daily activities.  Long Term Goal 5: Patient will decrease her restrictions to minimal in her left shoulder region.  Problem List Patient Active Problem List  Diagnosis  . Muscle weakness (generalized)  . Difficulty in walking  . Pain in joint, shoulder region  . Status post rotator cuff repair    End of Session Activity Tolerance: Patient tolerated treatment well General Behavior During Session: [facility] for tasks performed Cognition: [facility] for tasks performed OT Plan of Care OT Home Exercise Plan: Educated on a HEP for towel slides and tendon glides.  Consulted and Agree with Plan of Care: Patient  GO Functional Assessment Tool Used: UEFI scored 20% I level, 80% impairment level Functional Limitation: Self care Self Care Current Status [id number]): At least 80 percent but less than 100 percent impaired, limited or restricted Self Care Goal Status [id number]): At least 20 percent but less than 40 percent impaired, limited or restricted  Vangie Bicker, OTR/L  08/24/2012, 10:13 PM  Physician Documentation Your signature is required to indicate approval of the treatment plan as stated above.  Please sign and either send electronically or make a copy of this report for your files and return this physician signed original.  Please mark one 1.__approve of plan  2. ___approve of plan with the  following conditions.   ______________________________                                                          _____________________ Physician Signature                                                                                                             Date

## 2012-08-31 ENCOUNTER — Ambulatory Visit (HOSPITAL_COMMUNITY)
Admission: RE | Admit: 2012-08-31 | Discharge: 2012-08-31 | Disposition: A | Discharge status: Home / Self Care | Payer: Medicare Other | Source: Ambulatory Visit | Attending: Family Medicine | Admitting: Family Medicine

## 2012-08-31 NOTE — Progress Notes (Signed)
Occupational Therapy Treatment Patient Details  Name: [name] MRN: [id number] Date of Birth: [birth year]  Today's Date: 08/31/2012 Time: [id number] OT Time Calculation (min): 31 min Manual Therapy 148-208 20' Therapeutic Exercise 209-219 10'  Visit#: 2 of 16  Re-eval: 09/21/12    Authorization: [employer]  Authorization Time Period: before 10th visit  Authorization Visit#: 2 of 10  Subjective Symptoms/Limitations Symptoms: S:  My shoulder feels pretty good but it still bothers me so much. Limitations: Per MD, ok to begin strengthening exercises.  However, we will begin with PROM, progress to AAROM, AROM, strengthening as pain and shoulder mobility permits.  Pain Assessment Currently in Pain?: Yes Pain Score:   5 Pain Location: Shoulder Pain Orientation: Left Pain Type: Acute pain  Precautions/Restrictions     Exercise/Treatments Supine Protraction: PROM;10 reps Horizontal ABduction: PROM;10 reps External Rotation: PROM;10 reps Internal Rotation: PROM;10 reps Flexion: PROM;10 reps ABduction: PROM;10 reps Seated Elevation: AROM;10 reps Extension: AROM;10 reps Row: AROM;10 reps Therapy Ball Flexion: 20 reps ABduction: 20 reps ROM / Strengthening / Isometric Strengthening   Flexion: Supine;3X3" Extension: Supine;3X3" External Rotation: Supine;3X3" Internal Rotation: Supine;3X3" ABduction: Supine;3X3" ADduction: Supine;3X3"         Manual Therapy Manual Therapy: Myofascial release Myofascial Release: Myofascial release and manual stretching to left shoulder and scapular region to decrease pain and fascial restrictions and increase pain free mobili  Occupational Therapy Assessment and Plan OT Assessment and Plan Clinical Impression Statement: A;  Added isometrics, scapular AROM and ball stretches.  Pt. with good vasomotor response on upper arm after MFR. OT Plan: P:  Increase reps with isometrics and add pulleys.   Goals Short Term Goals Time to  Complete Short Term Goals: 4 weeks Short Term Goal 1: Patient will be educated on a HEP. Short Term Goal 2: Patient will increase left shoulder PROM to [facility] for increased ability to don and doff clothing independently. Short Term Goal 3: Patient will increase left shoulder strength to 3+/5 for increased ability to push herself out of a chair. Short Term Goal 4: Patient will decrease pain in her left shoulder to 3/10 when combing her hair. Short Term Goal 5: Patient will decrease fascial restrictions to moderate in her left shoulder region.  Long Term Goals Time to Complete Long Term Goals: 8 weeks Long Term Goal 1: Patient will return to her prior level of independence with her B/IADLs and leisure activiites.  Long Term Goal 2: Patient will increase her left shoulder AROM to [facility] for increased ability to reach into overhead cabinets. Long Term Goal 3: Patient will increase her left shoulder strength to 4+/5 for increased ability to pull up her covers. Long Term Goal 4: Patient will decrease her pain to 2/10 in her left shoulder when completing daily activities.  Long Term Goal 5: Patient will decrease her restrictions to minimal in her left shoulder region.  Problem List Patient Active Problem List  Diagnosis  . Muscle weakness (generalized)  . Difficulty in walking  . Pain in joint, shoulder region  . Status post rotator cuff repair    End of Session Activity Tolerance: Patient tolerated treatment well General Behavior During Session: [facility] for tasks performed Cognition: [facility] for tasks performed  GO   Mykelti Goldenstein L. Thera Flake, COTA/L 08/31/2012, 2:21 PM

## 2012-09-02 ENCOUNTER — Ambulatory Visit (HOSPITAL_COMMUNITY): Payer: Medicare Other | Admitting: Occupational Therapy

## 2012-09-03 ENCOUNTER — Ambulatory Visit (HOSPITAL_COMMUNITY)
Admission: RE | Admit: 2012-09-03 | Discharge: 2012-09-03 | Disposition: A | Discharge status: Home / Self Care | Payer: Medicare Other | Source: Ambulatory Visit | Attending: Family Medicine | Admitting: Family Medicine

## 2012-09-03 DIAGNOSIS — Z9889 Other specified postprocedural states: Secondary | ICD-10-CM

## 2012-09-03 NOTE — Progress Notes (Signed)
Occupational Therapy Treatment Patient Details  Name: [name] MRN: [id number] Date of Birth: Jun 16, 1923  Today's Date: 09/03/2012 Time: [id number] OT Time Calculation (min): 42 min MFR [id number] 17' Therex [id number] 25'  Visit#: 3 of 16  Re-eval: 09/21/12    Authorization: [employer]  Authorization Time Period: before 10th visit  Authorization Visit#: 3 of 10  Subjective Symptoms/Limitations Symptoms: S: My shoulder only hurts a little bit. My carpal tunnel is bothering me more.  Pain Assessment Currently in Pain?: Yes Pain Score:   4 Pain Location: Shoulder Pain Orientation: Left Pain Type: Acute pain  Precautions/Restrictions  Precautions Precautions: Shoulder;Fall  Exercise/Treatments Supine Protraction: PROM;10 reps Horizontal ABduction: PROM;10 reps External Rotation: PROM;10 reps Internal Rotation: PROM;10 reps Flexion: PROM;10 reps ABduction: PROM;10 reps Seated Elevation: AROM;10 reps Extension: AROM;10 reps Row: AROM;10 reps Pulleys Flexion: 1 minute ABduction: 1 minute Therapy Ball Flexion: 20 reps ABduction: 20 reps ROM / Strengthening / Isometric Strengthening   Flexion: Supine;5X5";Limitations Flexion Limitations: Difficulty activating the correct muscles to perform. Extension: Supine;5X5" External Rotation: Supine;5X5" Internal Rotation: Supine;5X5" ABduction: Supine;5X5" ADduction: Supine;5X5"    Manual Therapy Manual Therapy: Myofascial release Myofascial Release: Myofascial release and manual stretching to left shoulder and scapular region to decrease pain and fascial restrictions and increase pain free mobility,  Occupational Therapy Assessment and Plan OT Assessment and Plan Clinical Impression Statement: A; Increased time during isometrics and pulleys. Pt had difficulty with performing shoulder flexion isometric. pt wanted to perform elbow flexion instead of firing shoulder flexors.  OT Plan: P: Work on technique of  shoulder flexion for isometrics.   Goals Short Term Goals Time to Complete Short Term Goals: 4 weeks Short Term Goal 1: Patient will be educated on a HEP. Short Term Goal 1 Progress: Progressing toward goal Short Term Goal 2: Patient will increase left shoulder PROM to [facility] for increased ability to don and doff clothing independently. Short Term Goal 2 Progress: Progressing toward goal Short Term Goal 3: Patient will increase left shoulder strength to 3+/5 for increased ability to push herself out of a chair. Short Term Goal 3 Progress: Progressing toward goal Short Term Goal 4: Patient will decrease pain in her left shoulder to 3/10 when combing her hair. Short Term Goal 4 Progress: Progressing toward goal Short Term Goal 5: Patient will decrease fascial restrictions to moderate in her left shoulder region.  Short Term Goal 5 Progress: Progressing toward goal Long Term Goals Time to Complete Long Term Goals: 8 weeks Long Term Goal 1: Patient will return to her prior level of independence with her B/IADLs and leisure activiites.  Long Term Goal 1 Progress: Progressing toward goal Long Term Goal 2: Patient will increase her left shoulder AROM to [facility] for increased ability to reach into overhead cabinets. Long Term Goal 2 Progress: Progressing toward goal Long Term Goal 3: Patient will increase her left shoulder strength to 4+/5 for increased ability to pull up her covers. Long Term Goal 3 Progress: Progressing toward goal Long Term Goal 4: Patient will decrease her pain to 2/10 in her left shoulder when completing daily activities.  Long Term Goal 4 Progress: Progressing toward goal Long Term Goal 5: Patient will decrease her restrictions to minimal in her left shoulder region. Long Term Goal 5 Progress: Progressing toward goal  Problem List Patient Active Problem List  Diagnosis  . Muscle weakness (generalized)  . Difficulty in walking  . Pain in joint, shoulder region  . Status post  rotator cuff repair  End of Session Activity Tolerance: Patient tolerated treatment well General Behavior During Session: [facility] for tasks performed Cognition: [facility] for tasks performed   Ailene Ravel, OTR/L,CBIS   09/03/2012, 2:37 PM

## 2012-09-07 ENCOUNTER — Ambulatory Visit (HOSPITAL_COMMUNITY)
Admission: RE | Admit: 2012-09-07 | Discharge: 2012-09-07 | Disposition: A | Discharge status: Home / Self Care | Payer: Medicare Other | Source: Ambulatory Visit | Attending: Family Medicine | Admitting: Family Medicine

## 2012-09-07 DIAGNOSIS — Z9889 Other specified postprocedural states: Secondary | ICD-10-CM

## 2012-09-07 NOTE — Progress Notes (Signed)
Occupational Therapy Treatment Patient Details  Name: [name] MRN: [id number] Date of Birth: Oct 22, 1923  Today's Date: 09/07/2012 Time: 1050-1130 OT Time Calculation (min): 40 min Manual Therapy [id number] 25 ' Therapeutic Exercise 1116-1130 14'  Visit#: 4 of 16  Re-eval: 09/21/12    Authorization: [employer]  Authorization Time Period: before 10th visit  Authorization Visit#: 4 of 10  Subjective Symptoms/Limitations Symptoms: S:  My shoulder is some better. Limitations: Per MD, ok to begin strengthening exercises.  However, we will begin with PROM, progress to AAROM, AROM, strengthening as pain and shoulder mobility permits.  Pain Assessment Currently in Pain?: Yes Pain Score:   3 Pain Location: Shoulder Pain Orientation: Left Pain Type: Acute pain  Precautions/Restrictions     Exercise/Treatments Supine Protraction: PROM;10 reps Horizontal ABduction: PROM;10 reps External Rotation: PROM;10 reps Internal Rotation: PROM;10 reps Flexion: PROM;10 reps ABduction: PROM;10 reps Seated Elevation: AROM;10 reps Extension: AROM;10 reps Row: AROM;10 reps Pulleys Flexion: 2 minutes ABduction: 2 minutes Therapy Ball Flexion: 20 reps ABduction: 20 reps ROM / Strengthening / Isometric Strengthening Wall Wash: 30' seated Flexion: Supine;5X5" Flexion Limitations: cues to push up from her MP's vs PIP's Extension: Supine;5X5" External Rotation: Supine;5X5" Internal Rotation: Supine;5X5" ABduction: Supine;5X5" ADduction: Supine;5X5"    Manual Therapy Manual Therapy: Myofascial release Myofascial Release: Myofascial release and manual stretching to left shoulder and scapular region to decrease pain and fascial restrictions and increase pain free mobility  Occupational Therapy Assessment and Plan OT Assessment and Plan Clinical Impression Statement: A:  Cues for form/technique with isometrics.  Increased time with pulleys and added low wall for 30". OT Plan: P:   Attempt to increase wall wash to 45".   Goals Short Term Goals Time to Complete Short Term Goals: 4 weeks Short Term Goal 1: Patient will be educated on a HEP. Short Term Goal 2: Patient will increase left shoulder PROM to [facility] for increased ability to don and doff clothing independently. Short Term Goal 3: Patient will increase left shoulder strength to 3+/5 for increased ability to push herself out of a chair. Short Term Goal 4: Patient will decrease pain in her left shoulder to 3/10 when combing her hair. Short Term Goal 5: Patient will decrease fascial restrictions to moderate in her left shoulder region.  Long Term Goals Time to Complete Long Term Goals: 8 weeks Long Term Goal 1: Patient will return to her prior level of independence with her B/IADLs and leisure activiites.  Long Term Goal 2: Patient will increase her left shoulder AROM to [facility] for increased ability to reach into overhead cabinets. Long Term Goal 3: Patient will increase her left shoulder strength to 4+/5 for increased ability to pull up her covers. Long Term Goal 4: Patient will decrease her pain to 2/10 in her left shoulder when completing daily activities.  Long Term Goal 5: Patient will decrease her restrictions to minimal in her left shoulder region.  Problem List Patient Active Problem List  Diagnosis  . Muscle weakness (generalized)  . Difficulty in walking  . Pain in joint, shoulder region  . Status post rotator cuff repair    End of Session Activity Tolerance: Patient tolerated treatment well General Behavior During Session: [facility] for tasks performed Cognition: [facility] for tasks performed  GO   Girolamo Lortie L. Colette Dicamillo, COTA/L 09/07/2012, 11:43 AM

## 2012-09-09 ENCOUNTER — Ambulatory Visit (HOSPITAL_COMMUNITY): Payer: Medicare Other | Admitting: Specialist

## 2012-09-14 ENCOUNTER — Ambulatory Visit (HOSPITAL_COMMUNITY)
Admission: RE | Admit: 2012-09-14 | Discharge: 2012-09-14 | Disposition: A | Discharge status: Home / Self Care | Payer: Medicare Other | Source: Ambulatory Visit | Attending: Family Medicine | Admitting: Family Medicine

## 2012-09-14 DIAGNOSIS — Z9889 Other specified postprocedural states: Secondary | ICD-10-CM

## 2012-09-14 NOTE — Progress Notes (Signed)
Occupational Therapy Treatment Patient Details  Name: [name] MRN: [id number] Date of Birth: 1923/09/05  Today's Date: 09/14/2012 Time: [id number] OT Time Calculation (min): 40 min MFR 1105-1120 15' Therex [id number] 25'  Visit#: 5 of 16  Re-eval: 09/21/12    Authorization: [employer]  Authorization Time Period: before 10th visit  Authorization Visit#: 5 of 10  Subjective Symptoms/Limitations Symptoms: S: Easter I was sick. My shoulder is starting to feel better. Pain Assessment Currently in Pain?: Yes Pain Score:   2 Pain Location: Shoulder Pain Orientation: Left Pain Type: Acute pain  Precautions/Restrictions  Precautions Precautions: Shoulder;Fall  Exercise/Treatments Supine Protraction: PROM;10 reps Horizontal ABduction: PROM;10 reps External Rotation: PROM;10 reps Internal Rotation: PROM;10 reps Flexion: PROM;10 reps ABduction: PROM;10 reps Therapy Ball Flexion: 20 reps ABduction: 20 reps ROM / Strengthening / Isometric Strengthening Wall Wash: 50" seated. low level Thumb Tacks: attempted thumb tacks with support for arm. Too difficult at this time to complete Flexion: Supine;5X5" Extension: Supine;5X5" External Rotation: Supine;5X5" Internal Rotation: Supine;5X5" ABduction: Supine;5X5" ADduction: Supine;5X5"     Manual Therapy Manual Therapy: Myofascial release Myofascial Release: Myofascial release and manual stretching to left shoulder and scapular region to decrease pain and fascial restrictions and increase pain free mobility  Occupational Therapy Assessment and Plan OT Assessment and Plan Clinical Impression Statement: A: Assist needed during thumb tacks to support left arm. Pain felt during manual stretching. Able to withstand stretching to about 75% passive range. OT Plan: P: Cont to work on increase PROM to [facility] in a pain free zone.   Goals Short Term Goals Time to Complete Short Term Goals: 4 weeks Short Term Goal 1: Patient will  be educated on a HEP. Short Term Goal 1 Progress: Progressing toward goal Short Term Goal 2: Patient will increase left shoulder PROM to [facility] for increased ability to don and doff clothing independently. Short Term Goal 2 Progress: Progressing toward goal Short Term Goal 3: Patient will increase left shoulder strength to 3+/5 for increased ability to push herself out of a chair. Short Term Goal 3 Progress: Progressing toward goal Short Term Goal 4: Patient will decrease pain in her left shoulder to 3/10 when combing her hair. Short Term Goal 4 Progress: Progressing toward goal Short Term Goal 5: Patient will decrease fascial restrictions to moderate in her left shoulder region.  Short Term Goal 5 Progress: Progressing toward goal Long Term Goals Time to Complete Long Term Goals: 8 weeks Long Term Goal 1: Patient will return to her prior level of independence with her B/IADLs and leisure activiites.  Long Term Goal 1 Progress: Progressing toward goal Long Term Goal 2: Patient will increase her left shoulder AROM to [facility] for increased ability to reach into overhead cabinets. Long Term Goal 2 Progress: Progressing toward goal Long Term Goal 3: Patient will increase her left shoulder strength to 4+/5 for increased ability to pull up her covers. Long Term Goal 3 Progress: Progressing toward goal Long Term Goal 4: Patient will decrease her pain to 2/10 in her left shoulder when completing daily activities.  Long Term Goal 4 Progress: Progressing toward goal Long Term Goal 5: Patient will decrease her restrictions to minimal in her left shoulder region. Long Term Goal 5 Progress: Progressing toward goal  Problem List Patient Active Problem List  Diagnosis  . Muscle weakness (generalized)  . Difficulty in walking  . Pain in joint, shoulder region  . Status post rotator cuff repair    End of Session Activity Tolerance: Patient  tolerated treatment well General Behavior During Therapy: [facility] for  tasks assessed/performed Cognition: [facility] for tasks performed   Ailene Ravel, OTR/L,CBIS   09/14/2012, 2:12 PM

## 2012-09-16 ENCOUNTER — Ambulatory Visit (HOSPITAL_COMMUNITY)
Admission: RE | Admit: 2012-09-16 | Discharge: 2012-09-16 | Disposition: A | Discharge status: Home / Self Care | Payer: Medicare Other | Source: Ambulatory Visit | Attending: Orthopedic Surgery | Admitting: Orthopedic Surgery

## 2012-09-16 DIAGNOSIS — Z9889 Other specified postprocedural states: Secondary | ICD-10-CM

## 2012-09-16 NOTE — Progress Notes (Signed)
Occupational Therapy Treatment Patient Details  Name: [name] MRN: [id number] Date of Birth: 1924/02/27  Today's Date: 09/16/2012 Time: [id number] OT Time Calculation (min): 48 min MFR [id number] 13' Therex [id number] 35'  Visit#: 6 of 16  Re-eval: 09/21/12    Authorization: [employer]  Authorization Time Period: before 10th visit  Authorization Visit#: 6 of 10  Subjective Symptoms/Limitations Symptoms: S: My arm feels good today. It only hurts a little bit. Pain Assessment Currently in Pain?: Yes Pain Score:   1 Pain Location: Shoulder Pain Orientation: Left Pain Type: Acute pain  Precautions/Restrictions  Precautions Precautions: Shoulder;Fall  Exercise/Treatments Supine Protraction: PROM;AAROM;10 reps Horizontal ABduction: PROM;AAROM;10 reps External Rotation: PROM;AAROM;10 reps Internal Rotation: PROM;AAROM;10 reps Flexion: PROM;10 reps ABduction: PROM;AAROM;10 reps Seated Elevation: AROM;10 reps Extension: AROM;10 reps Row: AROM;10 reps Pulleys Flexion: 2 minutes ABduction: 2 minutes Therapy Ball Flexion: 20 reps ABduction: 20 reps    Manual Therapy Manual Therapy: Myofascial release Myofascial Release: Myofascial release and manual stretching to left shoulder and scapular region to decrease pain and fascial restrictions and increase pain free mobility  Occupational Therapy Assessment and Plan OT Assessment and Plan Clinical Impression Statement: A; Attempted AAROM supine. Patient able to perform protraction, IR/ER, and horizontal ABD. Unable to perform ABD and flexion due to pain in bicep region. OT Plan: P: Work on adding AAROM flexion and ABD without pain.   Goals Short Term Goals Time to Complete Short Term Goals: 4 weeks Short Term Goal 1: Patient will be educated on a HEP. Short Term Goal 2: Patient will increase left shoulder PROM to [facility] for increased ability to don and doff clothing independently. Short Term Goal 3: Patient will  increase left shoulder strength to 3+/5 for increased ability to push herself out of a chair. Short Term Goal 4: Patient will decrease pain in her left shoulder to 3/10 when combing her hair. Short Term Goal 5: Patient will decrease fascial restrictions to moderate in her left shoulder region.  Long Term Goals Time to Complete Long Term Goals: 8 weeks Long Term Goal 1: Patient will return to her prior level of independence with her B/IADLs and leisure activiites.  Long Term Goal 2: Patient will increase her left shoulder AROM to [facility] for increased ability to reach into overhead cabinets. Long Term Goal 3: Patient will increase her left shoulder strength to 4+/5 for increased ability to pull up her covers. Long Term Goal 4: Patient will decrease her pain to 2/10 in her left shoulder when completing daily activities.  Long Term Goal 5: Patient will decrease her restrictions to minimal in her left shoulder region.  Problem List Patient Active Problem List  Diagnosis  . Muscle weakness (generalized)  . Difficulty in walking  . Pain in joint, shoulder region  . Status post rotator cuff repair    End of Session Activity Tolerance: Patient tolerated treatment well General Behavior During Therapy: [facility] for tasks assessed/performed Cognition: [facility] for tasks performed   Ailene Ravel, OTR/L,CBIS   09/16/2012, 12:00 PM

## 2012-09-21 ENCOUNTER — Ambulatory Visit (HOSPITAL_COMMUNITY): Payer: Medicare Other | Admitting: Specialist

## 2012-09-22 ENCOUNTER — Ambulatory Visit (HOSPITAL_COMMUNITY): Payer: Medicare Other | Admitting: Specialist

## 2012-10-03 ENCOUNTER — Encounter (HOSPITAL_COMMUNITY): Payer: Self-pay | Admitting: *Deleted

## 2012-10-03 ENCOUNTER — Emergency Department (HOSPITAL_COMMUNITY): Payer: Medicare Other

## 2012-10-03 ENCOUNTER — Emergency Department (HOSPITAL_COMMUNITY)
Admission: EM | Admit: 2012-10-03 | Discharge: 2012-10-03 | Disposition: A | Discharge status: Home / Self Care | Payer: Medicare Other | Attending: Emergency Medicine | Admitting: Emergency Medicine

## 2012-10-03 DIAGNOSIS — E1149 Type 2 diabetes mellitus with other diabetic neurological complication: Secondary | ICD-10-CM | POA: Insufficient documentation

## 2012-10-03 DIAGNOSIS — Z87891 Personal history of nicotine dependence: Secondary | ICD-10-CM | POA: Insufficient documentation

## 2012-10-03 DIAGNOSIS — M25559 Pain in unspecified hip: Secondary | ICD-10-CM | POA: Insufficient documentation

## 2012-10-03 DIAGNOSIS — M129 Arthropathy, unspecified: Secondary | ICD-10-CM | POA: Insufficient documentation

## 2012-10-03 DIAGNOSIS — Z87828 Personal history of other (healed) physical injury and trauma: Secondary | ICD-10-CM | POA: Insufficient documentation

## 2012-10-03 DIAGNOSIS — K219 Gastro-esophageal reflux disease without esophagitis: Secondary | ICD-10-CM | POA: Insufficient documentation

## 2012-10-03 DIAGNOSIS — M109 Gout, unspecified: Secondary | ICD-10-CM | POA: Insufficient documentation

## 2012-10-03 DIAGNOSIS — Z8739 Personal history of other diseases of the musculoskeletal system and connective tissue: Secondary | ICD-10-CM | POA: Insufficient documentation

## 2012-10-03 DIAGNOSIS — Z8673 Personal history of transient ischemic attack (TIA), and cerebral infarction without residual deficits: Secondary | ICD-10-CM | POA: Insufficient documentation

## 2012-10-03 DIAGNOSIS — Z8711 Personal history of peptic ulcer disease: Secondary | ICD-10-CM | POA: Insufficient documentation

## 2012-10-03 DIAGNOSIS — I5030 Unspecified diastolic (congestive) heart failure: Secondary | ICD-10-CM | POA: Insufficient documentation

## 2012-10-03 DIAGNOSIS — E1142 Type 2 diabetes mellitus with diabetic polyneuropathy: Secondary | ICD-10-CM | POA: Insufficient documentation

## 2012-10-03 DIAGNOSIS — M25551 Pain in right hip: Secondary | ICD-10-CM

## 2012-10-03 DIAGNOSIS — Z8669 Personal history of other diseases of the nervous system and sense organs: Secondary | ICD-10-CM | POA: Insufficient documentation

## 2012-10-03 DIAGNOSIS — Z79899 Other long term (current) drug therapy: Secondary | ICD-10-CM | POA: Insufficient documentation

## 2012-10-03 DIAGNOSIS — Z791 Long term (current) use of non-steroidal anti-inflammatories (NSAID): Secondary | ICD-10-CM | POA: Insufficient documentation

## 2012-10-03 DIAGNOSIS — R011 Cardiac murmur, unspecified: Secondary | ICD-10-CM | POA: Insufficient documentation

## 2012-10-03 DIAGNOSIS — Z7982 Long term (current) use of aspirin: Secondary | ICD-10-CM | POA: Insufficient documentation

## 2012-10-03 DIAGNOSIS — Z9181 History of falling: Secondary | ICD-10-CM | POA: Insufficient documentation

## 2012-10-03 DIAGNOSIS — M7989 Other specified soft tissue disorders: Secondary | ICD-10-CM | POA: Insufficient documentation

## 2012-10-03 DIAGNOSIS — E785 Hyperlipidemia, unspecified: Secondary | ICD-10-CM | POA: Insufficient documentation

## 2012-10-03 MED ORDER — HYDROCODONE-ACETAMINOPHEN 5-325 MG PO TABS: 1.0000 | ORAL_TABLET | ORAL | Status: DC | PRN

## 2012-10-03 MED ORDER — HYDROCODONE-ACETAMINOPHEN 5-325 MG PO TABS
1.0000 | ORAL_TABLET | Freq: Once | ORAL | Status: AC
  Administered 2012-10-03: 1 via ORAL
  Filled 2012-10-03: qty 1

## 2012-10-03 NOTE — ED Notes (Signed)
Pt presents to er with c/o right hip pain that started yesterday, denies any injury. Pt states that she had problems with her hip hurting a few weeks ago but the pain went away. Pt also reports that she has been having problems with her legs swelling, has been seen by pcp for the swelling and placed on lasix with improvement in swelling.

## 2012-10-03 NOTE — ED Provider Notes (Signed)
History  This chart was scribed for Veryl Speak, MD by Roe Coombs, ED Scribe. The patient was seen in room APA18/APA18. Patient's care was started at [facility].   CSN: [id number]  Arrival date & time 10/03/12  0844   First MD Initiated Contact with Patient 10/03/12 0848      Chief Complaint  Patient presents with  . Hip Pain    The history is provided by the patient. No language interpreter was used.    HPI Comments: [name] is a 79 y.o. female who presents to the Emergency Department complaining of moderate to severe, constant, non-radiating, worsening right hip pain that began yesterday. Patient reports that she had similar pain 2 weeks ago that resolved itself. Patient denies any injury, trauma or fall. She states that pain is worse with ambulation, but she is able to walk. She denies any history of right hip replacement. There is no bruising to the right hip. Patient denies abdominal pain, numbness weakness or tingling of the extremities, chest pain, shortness of breath, fever, chills, nausea, vomiting or any other symptoms.  Patient states that she had recurrent lower extremity swelling for the past several weeks and that last week her PCP started her on Lasix. She states that swelling has significantly improved since beginning the medication. Patient's medical history includes HTN, diabetes, diastolic heart failure, heart murmur, diabetic neuropathy in upper and lower extremities, left rotator cuff tear, hyperlipidemia, GERD and arthritis. Patient is a former smoker (quit date: 53).   Past Medical History  Diagnosis Date  . Hypertension   . GERD (gastroesophageal reflux disease)   . Arthritis   . Slipped intervertebral disc   . Bilateral carpal tunnel syndrome   . Glaucoma(365)   . Cataracts, both eyes   . 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 Diabetes mellitus ORAL MED  . Gout   . Diastolic heart failure CARDIOLOGIST- DR Daneen Schick---  LAST VISIT  NOTE W/ CHART  . Heart murmur   . History of falling RECENT FALL 1 WK AGO--   NO INJURY  . Right leg weakness   . Walker as ambulation aid   . At risk for falling   . Diabetic neuropathy both hands and legs  . Hyperlipidemia   . Rotator cuff tear     left  . History of ulcer disease     many yrs ago  . History of transfusion     Past Surgical History  Procedure Laterality Date  . Carpal tunnel release  09/17/2011    Procedure: CARPAL TUNNEL RELEASE;  Surgeon: Magnus Sinning, MD;  Location: [facility];  Service: Orthopedics;  Laterality: Right;  . Abdominal hysterectomy  1966  . Transthoracic echocardiogram  12-26-2008  DR Daneen Schick    NORMAL LVF/  EF  71%/   MILD ASYMMETRIC SEPTAL HYPERTROPHY/ MILD LEFT ATRIAL ENLARGEMENT/ MODERATELY ELEVATED ESTIMATED RIGHT VENTRICULAR SYSTOLIC PRESSURE/ MILD MITRAL  &  TRICUSPID VALVE REGURG.  . Cardiovascular stress test  06-18-2005   DR Daneen Schick    NORMAL STUDY/ NO EVIDENCE ISCHEMIA/ EF 75%  . Carpal tunnel release  10/22/2011    Procedure: CARPAL TUNNEL RELEASE;  Surgeon: Magnus Sinning, MD;  Location: [facility];  Service: Orthopedics;  Laterality: Left;  . Lumbar laminectomy/decompression microdiscectomy  01/14/2012    Procedure: LUMBAR LAMINECTOMY/DECOMPRESSION MICRODISCECTOMY 3 LEVELS;  Surgeon: Magnus Sinning, MD;  Location: [facility];  Service:  Orthopedics;  Laterality: N/A;  Decompression Laminectomy of L2 - L3, L3 - L4 and L4 - L5 Central  (X-Ray)  . Appendectomy    . Shoulder open rotator cuff repair  06/02/2012    Procedure: ROTATOR CUFF REPAIR SHOULDER OPEN;  Surgeon: Magnus Sinning, MD;  Location: [facility];  Service: Orthopedics;  Laterality: Left;  Left Shoulder Open Distal Clavicle Resection Anterior Acrominectomy and Rotator Cuff Repair    No family history on file.  History  Substance Use Topics  . Smoking status: Former Smoker -- 1.00 packs/day for 30 years    Types: Cigarettes     Quit date: 05/28/1968  . Smokeless tobacco: Never Used  . Alcohol Use: No    OB History   Grav Para Term Preterm Abortions TAB SAB Ect Mult Living                  Review of Systems  A complete 10 system review of systems was obtained and all systems are negative except as noted in the HPI and PMH.    Allergies  Review of patient's allergies indicates no known allergies.  Home Medications   Current Outpatient Rx  Name  Route  Sig  Dispense  Refill  . allopurinol (ZYLOPRIM) 300 MG tablet   Oral   Take 300 mg by mouth daily with breakfast.          . amLODipine (NORVASC) 10 MG tablet   Oral   Take 10 mg by mouth every morning.          Marland Kitchen aspirin 81 MG tablet   Oral   Take 81 mg by mouth daily.          Marland Kitchen b complex vitamins tablet   Oral   Take 1 tablet by mouth daily.         Marland Kitchen glipiZIDE (GLUCOTROL) 10 MG tablet   Oral   Take 10 mg by mouth daily before breakfast.          . hydrALAZINE (APRESOLINE) 10 MG tablet   Oral   Take 10 mg by mouth daily with breakfast.          . HYDROcodone-acetaminophen (NORCO/VICODIN) 5-325 MG per tablet   Oral   Take 1-2 tablets by mouth every 4 (four) hours as needed.   30 tablet   1   . lisinopril (PRINIVIL,ZESTRIL) 40 MG tablet   Oral   Take 40 mg by mouth every morning.          . meloxicam (MOBIC) 15 MG tablet   Oral   Take 15 mg by mouth daily.         . methocarbamol (ROBAXIN) 500 MG tablet   Oral   Take 1 tablet (500 mg total) by mouth every 6 (six) hours as needed (spasm).   30 tablet   1   . metoprolol (LOPRESSOR) 50 MG tablet   Oral   Take 25 mg by mouth daily before breakfast. Only takes 1/2 tablet in the morning         . pravastatin (PRAVACHOL) 20 MG tablet   Oral   Take 20 mg by mouth every evening.         . probenecid (BENEMID) 500 MG tablet   Oral   Take 500 mg by mouth daily.          . ranitidine (ZANTAC) 150 MG tablet   Oral   Take 150 mg by mouth daily.           Marland Kitchen  timolol (BETIMOL) 0.25 % ophthalmic solution   Right Eye   Place 1 drop into the right eye 2 (two) times daily.            Triage Vitals: BP 161/66  Pulse 70  Temp(Src) 98.1 F (36.7 C)  Resp 20  SpO2 97%  Physical Exam  Vitals reviewed. Constitutional: She is oriented to person, place, and time. She appears well-developed and well-nourished. No distress.  HENT:  Head: Normocephalic and atraumatic.  Eyes: Conjunctivae and EOM are normal. Pupils are equal, round, and reactive to light.  Neck: Normal range of motion. Neck supple.  Cardiovascular: Normal rate, regular rhythm and normal heart sounds.   No murmur heard. Pulmonary/Chest: Effort normal and breath sounds normal. No respiratory distress.  Abdominal: Soft. Bowel sounds are normal. There is no tenderness.  Musculoskeletal: Normal range of motion. She exhibits no edema and no tenderness.       Right hip: She exhibits tenderness.  The right hip appears grossly normal. There is no ecchymosis or erythema. Tenderness to palpation over the lateral aspect of the right hip. I am able to internally and externally rotate at the hip without pain. Distal pulses and motor are intact.  Neurological: She is alert and oriented to person, place, and time. She has normal reflexes.  Skin: Skin is warm and dry. No rash noted. No erythema.  Psychiatric: She has a normal mood and affect. Her behavior is normal.    ED Course  Procedures (including critical care time) DIAGNOSTIC STUDIES: Oxygen Saturation is 97% on room air, adequate by my interpretation.    COORDINATION OF CARE: 9:16 AM- Will order x-ray of right hip. Patient informed of current plan for treatment and evaluation and agrees with plan at this time.   Labs Reviewed - No data to display   No results found.   No diagnosis found.    MDM  The patient presents with a 24 hour history of pain in the right hip with no injury or trauma.  She is ttp over the lateral aspect  of the hip but xrays are unremarkable.  I suspect this is trochanteric bursitis and will recommend nsaids and pain meds.  Return or follow up prn.        I personally performed the services described in this documentation, which was scribed in my presence. The recorded information has been reviewed and is accurate.     Veryl Speak, MD 10/03/12 1002

## 2012-10-05 ENCOUNTER — Ambulatory Visit (HOSPITAL_COMMUNITY)
Admission: RE | Admit: 2012-10-05 | Discharge: 2012-10-05 | Disposition: A | Discharge status: Home / Self Care | Payer: Medicare Other | Source: Ambulatory Visit | Attending: Family Medicine | Admitting: Family Medicine

## 2012-10-05 DIAGNOSIS — M6281 Muscle weakness (generalized): Secondary | ICD-10-CM | POA: Insufficient documentation

## 2012-10-05 DIAGNOSIS — R262 Difficulty in walking, not elsewhere classified: Secondary | ICD-10-CM | POA: Insufficient documentation

## 2012-10-05 DIAGNOSIS — IMO0001 Reserved for inherently not codable concepts without codable children: Secondary | ICD-10-CM | POA: Insufficient documentation

## 2012-10-05 DIAGNOSIS — Z9889 Other specified postprocedural states: Secondary | ICD-10-CM | POA: Insufficient documentation

## 2012-10-05 DIAGNOSIS — M25519 Pain in unspecified shoulder: Secondary | ICD-10-CM | POA: Insufficient documentation

## 2012-10-05 NOTE — Evaluation (Signed)
Occupational Therapy Re-Evaluation  Patient Details  Name: [name] MRN: [id number] Date of Birth: [birth year]  Today's Date: 10/05/2012 Time: [id number] OT Time Calculation (min): 51 min MFR [id number] 16' Therex [id number] 9' Reassess [phone] 26'  Visit#: 7 of 16  Re-eval: 11/02/12     Authorization: [employer]  Authorization Time Period: before 17th visit  Authorization Visit#: 7 of 87   Past Medical History:  Past Medical History  Diagnosis Date  . Hypertension   . GERD (gastroesophageal reflux disease)   . Arthritis   . Slipped intervertebral disc   . Bilateral carpal tunnel syndrome   . Glaucoma(365)   . Cataracts, both eyes   . 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 Diabetes mellitus [facility]  . Gout   . Diastolic heart failure CARDIOLOGIST- DR Daneen Schick---  LAST VISIT NOTE W/ CHART  . Heart murmur   . History of falling RECENT FALL 1 WK AGO--   NO INJURY  . Right leg weakness   . Walker as ambulation aid   . At risk for falling   . Diabetic neuropathy both hands and legs  . Hyperlipidemia   . Rotator cuff tear     left  . History of ulcer disease     many yrs ago  . History of transfusion    Past Surgical History:  Past Surgical History  Procedure Laterality Date  . Carpal tunnel release  09/17/2011    Procedure: CARPAL TUNNEL RELEASE;  Surgeon: Magnus Sinning, MD;  Location: [place];  Service: Orthopedics;  Laterality: Right;  . Abdominal hysterectomy  1966  . Transthoracic echocardiogram  12-26-2008  DR Daneen Schick    NORMAL LVF/  EF  71%/   MILD ASYMMETRIC SEPTAL HYPERTROPHY/ MILD LEFT ATRIAL ENLARGEMENT/ MODERATELY ELEVATED ESTIMATED RIGHT VENTRICULAR SYSTOLIC PRESSURE/ MILD MITRAL  &  TRICUSPID VALVE REGURG.  . Cardiovascular stress test  06-18-2005   DR Daneen Schick    NORMAL STUDY/ NO EVIDENCE ISCHEMIA/ EF 75%  . Carpal tunnel release  10/22/2011    Procedure: CARPAL TUNNEL RELEASE;  Surgeon: Magnus Sinning, MD;  Location: [place];  Service: Orthopedics;  Laterality: Left;  . Lumbar laminectomy/decompression microdiscectomy  01/14/2012    Procedure: LUMBAR LAMINECTOMY/DECOMPRESSION MICRODISCECTOMY 3 LEVELS;  Surgeon: Magnus Sinning, MD;  Location: [facility];  Service: Orthopedics;  Laterality: N/A;  Decompression Laminectomy of L2 - L3, L3 - L4 and L4 - L5 Central  (X-Ray)  . Appendectomy    . Shoulder open rotator cuff repair  06/02/2012    Procedure: ROTATOR CUFF REPAIR SHOULDER OPEN;  Surgeon: Magnus Sinning, MD;  Location: [facility];  Service: Orthopedics;  Laterality: Left;  Left Shoulder Open Distal Clavicle Resection Anterior Acrominectomy and Rotator Cuff Repair    Subjective Symptoms/Limitations Symptoms: S: I was in the ER on Sunday because I had horrible right hip pain. Pain Assessment Currently in Pain?: Yes Pain Score:   4 Pain Location: Hip Pain Orientation: Right Pain Type: Acute pain  Precautions/Restrictions  Precautions Precautions: Shoulder;Fall   Assessment Additional Assessments LUE AROM (degrees) LUE Overall AROM Comments: assessed in supine with shoulder adducted for external rotation and  Left Shoulder Flexion: 50 Degrees (on eval: 50) Left Shoulder ABduction: 58 Degrees (on eval: 58) Left Shoulder Internal Rotation: 90 Degrees (on eval: 68) Left Shoulder External Rotation: 85 Degrees (on eval: 28) LUE PROM (degrees) LUE Overall PROM  Comments: assessed in supine, external rotation and internal rotation with shoulder adducted Left Shoulder Flexion: 145 Degrees (on eval: 145) Left Shoulder ABduction: 122 Degrees (on eval: 122) Left Shoulder Internal Rotation: 90 Degrees (on eval: 68) Left Shoulder External Rotation: 85 Degrees (on eval: 40) LUE Strength Left Shoulder Flexion: 3/5 Left Shoulder ABduction: 3/5 Left Shoulder Internal Rotation: 3/5 Left Shoulder External Rotation: 3/5 Palpation Palpation: Min fascial restrictions in  upper arm region     Exercise/Treatments Supine Protraction: PROM;10 reps Horizontal ABduction: PROM;10 reps External Rotation: PROM;10 reps Internal Rotation: PROM;10 reps Flexion: PROM;10 reps ABduction: PROM;10 reps    Manual Therapy Manual Therapy: Myofascial release Myofascial Release: Myofascial release and manual stretching to left shoulder and scapular region to decrease pain and fascial restrictions and increase pain free mobility  Occupational Therapy Assessment and Plan OT Assessment and Plan Clinical Impression Statement: A: reassessment performed this date. See MD note for progress.  OT Plan: P: Work on adding AAROM flexion and ABD without pain. Resume missed exercises.    Goals Short Term Goals Time to Complete Short Term Goals: 4 weeks Short Term Goal 1: Patient will be educated on a HEP. Short Term Goal 1 Progress: Met Short Term Goal 2: Patient will increase left shoulder PROM to [facility] for increased ability to don and doff clothing independently. Short Term Goal 2 Progress: Met Short Term Goal 3: Patient will increase left shoulder strength to 3+/5 for increased ability to push herself out of a chair. Short Term Goal 3 Progress: Met Short Term Goal 4: Patient will decrease pain in her left shoulder to 3/10 when combing her hair. Short Term Goal 4 Progress: Progressing toward goal Short Term Goal 5: Patient will decrease fascial restrictions to moderate in her left shoulder region.  Short Term Goal 5 Progress: Met Long Term Goals Time to Complete Long Term Goals: 8 weeks Long Term Goal 1: Patient will return to her prior level of independence with her B/IADLs and leisure activiites.  Long Term Goal 1 Progress: Progressing toward goal Long Term Goal 2: Patient will increase her left shoulder AROM to [facility] for increased ability to reach into overhead cabinets. Long Term Goal 2 Progress: Progressing toward goal Long Term Goal 3: Patient will increase her left shoulder  strength to 4+/5 for increased ability to pull up her covers. Long Term Goal 3 Progress: Progressing toward goal Long Term Goal 4: Patient will decrease her pain to 2/10 in her left shoulder when completing daily activities.  Long Term Goal 4 Progress: Progressing toward goal Long Term Goal 5: Patient will decrease her restrictions to minimal in her left shoulder region. Long Term Goal 5 Progress: Met  Problem List Patient Active Problem List   Diagnosis Date Noted  . Status post rotator cuff repair 08/24/2012  . Muscle weakness (generalized) 03/24/2012  . Difficulty in walking 03/24/2012  . Pain in joint, shoulder region 03/24/2012    End of Session Activity Tolerance: Patient tolerated treatment well General Behavior During Therapy: [facility] for tasks assessed/performed Cognition: WFL for tasks performed  GO Functional Assessment Tool Used: UEFI score: 47.5% independent, 52.5% impaired Functional Limitation: Self care Self Care Current Status [id number]): At least 40 percent but less than 60 percent impaired, limited or restricted Self Care Goal Status [id number]): At least 20 percent but less than 40 percent impaired, limited or restricted  Ailene Ravel, OTR/L,CBIS   10/05/2012, 10:43 AM  Physician Documentation Your signature is required to indicate approval of the treatment plan as  stated above.  Please sign and either send electronically or make a copy of this report for your files and return this physician signed original.  Please mark one 1.__approve of plan  2. ___approve of plan with the following conditions.   ______________________________                                                          _____________________ Physician Signature                                                                                                             Date

## 2012-10-07 ENCOUNTER — Ambulatory Visit (HOSPITAL_COMMUNITY)
Admission: RE | Admit: 2012-10-07 | Discharge: 2012-10-07 | Disposition: A | Discharge status: Home / Self Care | Payer: Medicare Other | Source: Ambulatory Visit | Attending: Family Medicine | Admitting: Family Medicine

## 2012-10-07 NOTE — Progress Notes (Signed)
Occupational Therapy Treatment Patient Details  Name: [name] MRN: [id number] Date of Birth: [birth year]  Today's Date: 10/07/2012 Time: [id number] OT Time Calculation (min): 42 min MFR [id number] 12' Therex [phone] 30'   Visit#: 8 of 16  Re-eval: 11/02/12    Authorization: [employer]  Authorization Time Period: before 17th visit  Authorization Visit#: 8 of 17  Subjective Symptoms/Limitations Symptoms: S: Yesterday I stayed in bed until 2:00. I didn't feel good enough to get up. Pain Assessment Currently in Pain?: Yes Pain Score:   3 Pain Location: Shoulder Pain Orientation: Right Pain Type: Acute pain  Precautions/Restrictions  Precautions Precautions: Shoulder;Fall  Exercise/Treatments Supine Protraction: PROM;AAROM;10 reps Horizontal ABduction: PROM;AAROM;10 reps External Rotation: PROM;AAROM;10 reps Internal Rotation: PROM;AAROM;10 reps Flexion: PROM;AAROM;10 reps ABduction: PROM;AAROM;10 reps;Limitations ABduction Limitations: max fascilitation from therapist to support arm and keep elbow extended Pulleys Flexion: 2 minutes ABduction: 2 minutes    Manual Therapy Manual Therapy: Myofascial release Myofascial Release: Myofascial release and manual stretching to left shoulder and scapular region to decrease pain and fascial restrictions and increase pain free mobility  Occupational Therapy Assessment and Plan OT Assessment and Plan Clinical Impression Statement: A: Patient required max fascilitation of left arm during AAROM ABD.  OT Plan: P: Resume missed exercises. Work on performing AAROM ABD with less fascilitation.   Goals Short Term Goals Time to Complete Short Term Goals: 4 weeks Short Term Goal 1: Patient will be educated on a HEP. Short Term Goal 2: Patient will increase left shoulder PROM to [facility] for increased ability to don and doff clothing independently. Short Term Goal 3: Patient will increase left shoulder strength to 3+/5 for increased  ability to push herself out of a chair. Short Term Goal 4: Patient will decrease pain in her left shoulder to 3/10 when combing her hair. Short Term Goal 5: Patient will decrease fascial restrictions to moderate in her left shoulder region.  Long Term Goals Time to Complete Long Term Goals: 8 weeks Long Term Goal 1: Patient will return to her prior level of independence with her B/IADLs and leisure activiites.  Long Term Goal 2: Patient will increase her left shoulder AROM to [facility] for increased ability to reach into overhead cabinets. Long Term Goal 3: Patient will increase her left shoulder strength to 4+/5 for increased ability to pull up her covers. Long Term Goal 4: Patient will decrease her pain to 2/10 in her left shoulder when completing daily activities.  Long Term Goal 5: Patient will decrease her restrictions to minimal in her left shoulder region.  Problem List Patient Active Problem List   Diagnosis Date Noted  . Status post rotator cuff repair 08/24/2012  . Muscle weakness (generalized) 03/24/2012  . Difficulty in walking 03/24/2012  . Pain in joint, shoulder region 03/24/2012    End of Session Activity Tolerance: Patient tolerated treatment well General Behavior During Therapy: [facility] for tasks assessed/performed Cognition: [facility] for tasks performed   Ailene Ravel, OTR/L,CBIS   10/07/2012, 9:47 AM

## 2012-10-12 ENCOUNTER — Ambulatory Visit (HOSPITAL_COMMUNITY)
Admission: RE | Admit: 2012-10-12 | Discharge: 2012-10-12 | Disposition: A | Discharge status: Home / Self Care | Payer: Medicare Other | Source: Ambulatory Visit | Attending: Family Medicine | Admitting: Family Medicine

## 2012-10-12 DIAGNOSIS — Z9889 Other specified postprocedural states: Secondary | ICD-10-CM

## 2012-10-12 NOTE — Progress Notes (Signed)
Occupational Therapy Treatment Patient Details  Name: [name] MRN: [id number] Date of Birth: 10/20/1923  Today's Date: 10/12/2012 Time: [id number] OT Time Calculation (min): 45 min Manual Therapy [phone] 20' Therapeutic exercises [phone] 25' Visit#: 9 of 16  Re-eval: 11/02/12    Authorization: [employer]  Authorization Time Period: before 17th visit  Authorization Visit#: 9 of 17  Subjective  S:  I  want to be able to reach overhead and pick things up easier.  Limitations: Per MD, ok to begin strengthening exercises.  However, we will begin with PROM, progress to AAROM, AROM, strengthening as pain and shoulder mobility permits.  Pain Assessment Currently in Pain?: Yes Pain Score:   2 Pain Location: Shoulder Pain Orientation: Left Pain Type: Acute pain  Exercise/Treatments Supine Protraction: PROM;AAROM;10 reps Horizontal ABduction: Limitations Horizontal ABduction Limitations: with min hand over hand assist from OTR/L External Rotation: PROM;AAROM;10 reps;Limitations External Rotation Limitations: with min facilatation from OTR.L Internal Rotation: PROM;AAROM;10 reps;Limitations Internal Rotation Limitations: with min facilitation from OTR/L Flexion: PROM;AAROM;10 reps;Limitations Flexion Limitations: min facilitation from OTR/L ABduction: PROM;AAROM;10 reps;Limitations ABduction Limitations: min facilitation from OTR/L Seated Extension: Theraband;10 reps Theraband Level (Shoulder Extension): Level 2 (Red) Row: Theraband;10 reps Theraband Level (Shoulder Row): Level 2 (Red) Protraction: AAROM;10 reps Horizontal ABduction: AAROM;10 reps External Rotation: AAROM;10 reps;Theraband Theraband Level (Shoulder External Rotation): Level 2 (Red) Internal Rotation: AAROM;10 reps;Theraband Theraband Level (Shoulder Internal Rotation): Level 2 (Red) Flexion: AAROM;10 reps Abduction: AAROM;10 reps Therapy Ball Flexion: 20 reps ABduction: 20 reps       Manual  Therapy Manual Therapy: Myofascial release Myofascial Release: Myofascial release and manual stretching to left shoulder and scapular region to decrease pain and fascial restrictions and increase pain free mobility  Occupational Therapy Assessment and Plan OT Assessment and Plan Clinical Impression Statement: A:  Patient completed AAROM in supine with min facilitation from OTR/L, rather than dowel rod this date.  Added AAROM in seated with dowel and min faciliatation from OTR/L OT Plan: P:  Add supine exercises for scapular stability for increased independence with functional activities that involve reaching to her side and overhead.    Goals Short Term Goals Time to Complete Short Term Goals: 4 weeks Short Term Goal 1: Patient will be educated on a HEP. Short Term Goal 2: Patient will increase left shoulder PROM to [facility] for increased ability to don and doff clothing independently. Short Term Goal 3: Patient will increase left shoulder strength to 3+/5 for increased ability to push herself out of a chair. Short Term Goal 4: Patient will decrease pain in her left shoulder to 3/10 when combing her hair. Short Term Goal 4 Progress: Progressing toward goal Short Term Goal 5: Patient will decrease fascial restrictions to moderate in her left shoulder region.  Short Term Goal 5 Progress: Met Long Term Goals Time to Complete Long Term Goals: 8 weeks Long Term Goal 1: Patient will return to her prior level of independence with her B/IADLs and leisure activiites.  Long Term Goal 1 Progress: Progressing toward goal Long Term Goal 2: Patient will increase her left shoulder AROM to [facility] for increased ability to reach into overhead cabinets. Long Term Goal 2 Progress: Progressing toward goal Long Term Goal 3: Patient will increase her left shoulder strength to 4+/5 for increased ability to pull up her covers. Long Term Goal 3 Progress: Progressing toward goal Long Term Goal 4: Patient will decrease her  pain to 2/10 in her left shoulder when completing daily activities.  Long Term  Goal 4 Progress: Progressing toward goal Long Term Goal 5: Patient will decrease her restrictions to minimal in her left shoulder region.  Problem List Patient Active Problem List   Diagnosis Date Noted  . Status post rotator cuff repair 08/24/2012  . Muscle weakness (generalized) 03/24/2012  . Difficulty in walking 03/24/2012  . Pain in joint, shoulder region 03/24/2012    End of Session Activity Tolerance: Patient tolerated treatment well General Behavior During Therapy: [facility] for tasks assessed/performed Cognition: WFL for tasks performed  [place], OTR/L  10/12/2012, 10:20 AM

## 2012-10-14 ENCOUNTER — Ambulatory Visit (HOSPITAL_COMMUNITY)
Admission: RE | Admit: 2012-10-14 | Discharge: 2012-10-14 | Disposition: A | Discharge status: Home / Self Care | Payer: Medicare Other | Source: Ambulatory Visit | Attending: Family Medicine | Admitting: Family Medicine

## 2012-10-14 DIAGNOSIS — Z9889 Other specified postprocedural states: Secondary | ICD-10-CM

## 2012-10-14 NOTE — Progress Notes (Signed)
Occupational Therapy Treatment Patient Details  Name: [name] MRN: [id number] Date of Birth: 12-Jun-1923  Today's Date: 10/14/2012 Time: [id number] OT Time Calculation (min): 36 min Manual Therapy [phone] 11' Therapeutic Exercises [phone] 25' Visit#: 10 of 16  Re-eval: 11/02/12    Authorization: [employer]  Authorization Time Period: before 17th visit  Authorization Visit#: 10 of 17  Subjective S:  I feel ok, its about a 3/10. Limitations: Per MD, ok to begin strengthening exercises.  However, we will begin with PROM, progress to AAROM, AROM, strengthening as pain and shoulder mobility permits.  Pain Assessment Currently in Pain?: Yes Pain Score:   3 Pain Location: Shoulder Pain Orientation: Left Pain Type: Acute pain  Precautions/Restrictions     Exercise/Treatments Supine Protraction: PROM;AROM;10 reps Horizontal ABduction: PROM;AAROM;10 reps Horizontal ABduction Limitations: with min hand over hand assist from OTR/L External Rotation: PROM;10 reps;AROM Internal Rotation: PROM;AROM;10 reps Flexion: PROM;AAROM;10 reps;AROM;5 reps Flexion Limitations: min facilitation from OTR/L wtih AAROM ABduction: PROM;AROM;10 reps Seated Protraction: AAROM;12 reps Horizontal ABduction: AAROM;12 reps External Rotation: AAROM;12 reps Internal Rotation: AAROM;12 reps Flexion: AAROM;12 reps Abduction: AAROM;12 reps ROM / Strengthening / Isometric Strengthening Proximal Shoulder Strengthening, Supine: 10 reps with min facilitation from OTR/L Rhythmic Stabilization, Supine: 10 seconds with arm flexed to 90 and 10 seconds with arm abducted and shoulder in neutral rotation      Manual Therapy Manual Therapy: Myofascial release Myofascial Release: Myofascial release and manual stretching to left shoulder and scapular region to decrease pain and fascial restrictions and increase pain free mobility  Occupational Therapy Assessment and Plan OT Assessment and Plan Clinical  Impression Statement: A:  Patient able to complete AROM in supine this date with assist from OTR/L for horizontal abduction only.  Recommended patient removing platform from walker and placing her hand on walker as normally would. OT Plan: P:  Increase contraction time on supine scapular stabilty exercises for increased independence with functional activities that involve reaching to her side and overhead.   Goals Short Term Goals Time to Complete Short Term Goals: 4 weeks Short Term Goal 1: Patient will be educated on a HEP. Short Term Goal 2: Patient will increase left shoulder PROM to [facility] for increased ability to don and doff clothing independently. Short Term Goal 3: Patient will increase left shoulder strength to 3+/5 for increased ability to push herself out of a chair. Short Term Goal 4: Patient will decrease pain in her left shoulder to 3/10 when combing her hair. Short Term Goal 5: Patient will decrease fascial restrictions to moderate in her left shoulder region.  Long Term Goals Time to Complete Long Term Goals: 8 weeks Long Term Goal 1: Patient will return to her prior level of independence with her B/IADLs and leisure activiites.  Long Term Goal 2: Patient will increase her left shoulder AROM to [facility] for increased ability to reach into overhead cabinets. Long Term Goal 3: Patient will increase her left shoulder strength to 4+/5 for increased ability to pull up her covers. Long Term Goal 4: Patient will decrease her pain to 2/10 in her left shoulder when completing daily activities.  Long Term Goal 5: Patient will decrease her restrictions to minimal in her left shoulder region.  Problem List Patient Active Problem List   Diagnosis Date Noted  . Status post rotator cuff repair 08/24/2012  . Muscle weakness (generalized) 03/24/2012  . Difficulty in walking 03/24/2012  . Pain in joint, shoulder region 03/24/2012    End of Session Activity Tolerance:  Patient tolerated treatment  well General Behavior During Therapy: WFL for tasks assessed/performed Cognition: WFL for tasks performed OT Plan of Care OT Home Exercise Plan: Discussed use of walker without platform.   Consulted and Agree with Plan of Care: Patient  GO    Vangie Bicker, OTR/L\ 10/14/2012, 10:16 AM

## 2012-10-19 ENCOUNTER — Ambulatory Visit (HOSPITAL_COMMUNITY)
Admission: RE | Admit: 2012-10-19 | Discharge: 2012-10-19 | Disposition: A | Discharge status: Home / Self Care | Payer: Medicare Other | Source: Ambulatory Visit | Attending: Family Medicine | Admitting: Family Medicine

## 2012-10-19 DIAGNOSIS — Z9889 Other specified postprocedural states: Secondary | ICD-10-CM

## 2012-10-19 NOTE — Progress Notes (Signed)
Occupational Therapy Treatment Patient Details  Name: [name] MRN: [id number] Date of Birth: 1923/07/19  Today's Date: 10/19/2012 Time: [id number] OT Time Calculation (min): 38 min Manual Therapy [phone] 13' Therapeutic Exercises [phone]' Visit#: 11 of 16  Re-eval: 11/02/12    Authorization: [employer]  Authorization Time Period: before 17th visit  Authorization Visit#: 11 of 17  Subjective S:  I know its not all the way well yet. Limitations: Per MD, ok to begin strengthening exercises.  However, we will begin with PROM, progress to AAROM, AROM, strengthening as pain and shoulder mobility permits.  Pain Assessment Currently in Pain?: Yes Pain Score:   2 Pain Location: Shoulder Pain Orientation: Left Pain Type: Acute pain  Precautions/Restrictions     Exercise/Treatments Supine Protraction: PROM;10 reps;AROM;12 reps Horizontal ABduction: PROM;10 reps;AAROM;12 reps Horizontal ABduction Limitations: with min facilitation from OTR/L External Rotation: PROM;10 reps;AROM;12 reps Internal Rotation: PROM;10 reps;AROM;12 reps Flexion: PROM;10 reps;AAROM;12 reps Flexion Limitations: min facilitation from OTR/L wtih AAROM ABduction: PROM;10 reps;AAROM;12 reps ABduction Limitations: min facilitation from OTR/L Seated Protraction: AAROM;15 reps Horizontal ABduction: AAROM;15 reps External Rotation: AAROM;15 reps Internal Rotation: AAROM;15 reps Flexion: AAROM;15 reps Abduction: AAROM;15 reps ROM / Strengthening / Isometric Strengthening Proximal Shoulder Strengthening, Supine: 10 reps with min facilitation from OTR/L Rhythmic Stabilization, Supine: 15 seconds with arm flexed to 90 and 10 seconds with arm abducted and shoulder in neutral rotation      Manual Therapy Manual Therapy: Myofascial release Myofascial Release: Myofascial release and manual stretching to left shoulder and scapular region to decrease pain and fascial restrictions and increase pain free  mobility  Occupational Therapy Assessment and Plan OT Assessment and Plan Clinical Impression Statement: A:  Patient requested to be done prior to completing theraband exercises secondary to being fatigued.  Removed platform from walker and patient reported that walker was much easier to navigate without platform.  OT Plan: P:  Add theraband exercises, reassess for MD visit on 10/26/12.   Goals Short Term Goals Time to Complete Short Term Goals: 4 weeks Short Term Goal 1: Patient will be educated on a HEP. Short Term Goal 2: Patient will increase left shoulder PROM to [facility] for increased ability to don and doff clothing independently. Short Term Goal 3: Patient will increase left shoulder strength to 3+/5 for increased ability to push herself out of a chair. Short Term Goal 4: Patient will decrease pain in her left shoulder to 3/10 when combing her hair. Short Term Goal 4 Progress: Progressing toward goal Short Term Goal 5: Patient will decrease fascial restrictions to moderate in her left shoulder region.  Long Term Goals Time to Complete Long Term Goals: 8 weeks Long Term Goal 1: Patient will return to her prior level of independence with her B/IADLs and leisure activiites.  Long Term Goal 1 Progress: Progressing toward goal Long Term Goal 2: Patient will increase her left shoulder AROM to [facility] for increased ability to reach into overhead cabinets. Long Term Goal 2 Progress: Progressing toward goal Long Term Goal 3: Patient will increase her left shoulder strength to 4+/5 for increased ability to pull up her covers. Long Term Goal 3 Progress: Progressing toward goal Long Term Goal 4: Patient will decrease her pain to 2/10 in her left shoulder when completing daily activities.  Long Term Goal 4 Progress: Progressing toward goal Long Term Goal 5: Patient will decrease her restrictions to minimal in her left shoulder region.  Problem List Patient Active Problem List   Diagnosis Date Noted  .  Status post rotator cuff repair 08/24/2012  . Muscle weakness (generalized) 03/24/2012  . Difficulty in walking 03/24/2012  . Pain in joint, shoulder region 03/24/2012    End of Session Activity Tolerance: Patient tolerated treatment well General Behavior During Therapy: [facility] for tasks assessed/performed Cognition: WFL for tasks performed   Vangie Bicker, OTR/L  10/19/2012, 10:20 AM

## 2012-10-21 ENCOUNTER — Ambulatory Visit (HOSPITAL_COMMUNITY): Payer: Medicare Other

## 2012-10-25 ENCOUNTER — Ambulatory Visit (HOSPITAL_COMMUNITY)
Admission: RE | Admit: 2012-10-25 | Discharge: 2012-10-25 | Disposition: A | Discharge status: Home / Self Care | Payer: Medicare Other | Source: Ambulatory Visit | Attending: Orthopedic Surgery | Admitting: Orthopedic Surgery

## 2012-10-25 DIAGNOSIS — M25519 Pain in unspecified shoulder: Secondary | ICD-10-CM | POA: Insufficient documentation

## 2012-10-25 DIAGNOSIS — M6281 Muscle weakness (generalized): Secondary | ICD-10-CM | POA: Insufficient documentation

## 2012-10-25 DIAGNOSIS — I10 Essential (primary) hypertension: Secondary | ICD-10-CM | POA: Insufficient documentation

## 2012-10-25 DIAGNOSIS — E119 Type 2 diabetes mellitus without complications: Secondary | ICD-10-CM | POA: Insufficient documentation

## 2012-10-25 DIAGNOSIS — IMO0001 Reserved for inherently not codable concepts without codable children: Secondary | ICD-10-CM | POA: Insufficient documentation

## 2012-10-25 DIAGNOSIS — Z9889 Other specified postprocedural states: Secondary | ICD-10-CM

## 2012-10-25 NOTE — Evaluation (Signed)
Occupational Therapy Re-Evaluation  Patient Details  Name: [name] MRN: [id number] Date of Birth: 1924-01-18  Today's Date: 10/25/2012 Time: [id number] OT Time Calculation (min): 40 min MFR [phone] 16' Reassess [phone] 16' Therex [phone] 8'  Visit#: 12 of 24  Re-eval: 11/22/12  Assessment Diagnosis: S/P Left RCR  Authorization: [employer]  Authorization Time Period: before 29th visit  Authorization Visit#: 46 of 29   Past Medical History:  Past Medical History  Diagnosis Date  . Hypertension   . GERD (gastroesophageal reflux disease)   . Arthritis   . Slipped intervertebral disc   . Bilateral carpal tunnel syndrome   . Glaucoma   . Cataracts, both eyes   . 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 Diabetes mellitus [facility]AL [facility]  . Gout   . Diastolic heart failure CARDIOLOGIST- DR Daneen Schick---  LAST VISIT NOTE W/ CHART  . Heart murmur   . History of falling RECENT FALL 1 WK AGO--   NO INJURY  . Right leg weakness   . Walker as ambulation aid   . At risk for falling   . Diabetic neuropathy both hands and legs  . Hyperlipidemia   . Rotator cuff tear     left  . History of ulcer disease     many yrs ago  . History of transfusion    Past Surgical History:  Past Surgical History  Procedure Laterality Date  . Carpal tunnel release  09/17/2011    Procedure: CARPAL TUNNEL RELEASE;  Surgeon: Magnus Sinning, MD;  Location: [place];  Service: Orthopedics;  Laterality: Right;  . Abdominal hysterectomy  1966  . Transthoracic echocardiogram  12-26-2008  DR Daneen Schick    NORMAL LVF/  EF  71%/   MILD ASYMMETRIC SEPTAL HYPERTROPHY/ MILD LEFT ATRIAL ENLARGEMENT/ MODERATELY ELEVATED ESTIMATED RIGHT VENTRICULAR SYSTOLIC PRESSURE/ MILD MITRAL  &  TRICUSPID VALVE REGURG.  . Cardiovascular stress test  06-18-2005   DR Daneen Schick    NORMAL STUDY/ NO EVIDENCE ISCHEMIA/ EF 75%  . Carpal tunnel release  10/22/2011    Procedure: CARPAL  TUNNEL RELEASE;  Surgeon: Magnus Sinning, MD;  Location: [place];  Service: Orthopedics;  Laterality: Left;  . Lumbar laminectomy/decompression microdiscectomy  01/14/2012    Procedure: LUMBAR LAMINECTOMY/DECOMPRESSION MICRODISCECTOMY 3 LEVELS;  Surgeon: Magnus Sinning, MD;  Location: [facility];  Service: Orthopedics;  Laterality: N/A;  Decompression Laminectomy of L2 - L3, L3 - L4 and L4 - L5 Central  (X-Ray)  . Appendectomy    . Shoulder open rotator cuff repair  06/02/2012    Procedure: ROTATOR CUFF REPAIR SHOULDER OPEN;  Surgeon: Magnus Sinning, MD;  Location: [facility];  Service: Orthopedics;  Laterality: Left;  Left Shoulder Open Distal Clavicle Resection Anterior Acrominectomy and Rotator Cuff Repair    Subjective Symptoms/Limitations Symptoms: S: I am able to reach up which I couldn't do before but I still need help from my right arm to raise it high.  Pain Assessment Currently in Pain?: Yes Pain Score:   2 Pain Location: Shoulder Pain Orientation: Left Pain Type: Acute pain  Precautions/Restrictions  Precautions Precautions: Shoulder;Fall  Assessment Additional Assessments LUE AROM (degrees) LUE Overall AROM Comments: assessed in supine with shoulder adducted for external rotation and  Left Shoulder Flexion: 145 Degrees (last progress note: 50) Left Shoulder ABduction: 110 Degrees (last progress note: 58) Left Shoulder Internal Rotation: 90 Degrees (last progress note:  90) Left Shoulder External Rotation: 90 Degrees (last progress note: 85) LUE PROM (degrees) LUE Overall PROM Comments: assessed in supine, external rotation and internal rotation with shoulder adducted Left Shoulder Flexion: 155 Degrees (last progress note: 45) Left Shoulder ABduction: 130 Degrees (last progress note: 122) Left Shoulder Internal Rotation: 90 Degrees (last progress note: 90) Left Shoulder External Rotation: 90 Degrees (last progress note: 85) LUE Strength LUE Overall  Strength Comments: LUE strength remains at a 3/5 as it was at last progress note. Left Shoulder Flexion: 3/5 Left Shoulder ABduction: 3/5 Left Shoulder Internal Rotation: 3/5 Left Shoulder External Rotation: 3/5 Palpation Palpation: trace restrictions in upper arm region     Exercise/Treatments Supine Protraction: PROM;10 reps Horizontal ABduction: PROM;10 reps External Rotation: PROM;10 reps Internal Rotation: PROM;10 reps Flexion: PROM;10 reps ABduction: PROM;10 reps Pulleys Flexion: 2 minutes ABduction: 2 minutes    Manual Therapy Manual Therapy: Myofascial release Myofascial Release: Myofascial release and manual stretching to left shoulder and scapular region to decrease pain and fascial restrictions and increase pain free mobility  Occupational Therapy Assessment and Plan OT Assessment and Plan Clinical Impression Statement: A: See MD note for progress. Pt has made significant gains with supine AROM and PROM.  OT Frequency: Min 2X/week OT Duration: 4 weeks OT Plan: P:  Add theraband exercises   Goals Short Term Goals Time to Complete Short Term Goals: 4 weeks Short Term Goal 1: Patient will be educated on a HEP. Short Term Goal 2: Patient will increase left shoulder PROM to [facility] for increased ability to don and doff clothing independently. Short Term Goal 3: Patient will increase left shoulder strength to 3+/5 for increased ability to push herself out of a chair. Short Term Goal 4: Patient will decrease pain in her left shoulder to 3/10 when combing her hair. Short Term Goal 5: Patient will decrease fascial restrictions to moderate in her left shoulder region.  Long Term Goals Time to Complete Long Term Goals: 8 weeks Long Term Goal 1: Patient will return to her prior level of independence with her B/IADLs and leisure activiites.  Long Term Goal 1 Progress: Progressing toward goal Long Term Goal 2: Patient will increase her left shoulder AROM to [facility] for increased  ability to reach into overhead cabinets. Long Term Goal 2 Progress: Progressing toward goal Long Term Goal 3: Patient will increase her left shoulder strength to 4+/5 for increased ability to pull up her covers. Long Term Goal 3 Progress: Progressing toward goal Long Term Goal 4: Patient will decrease her pain to 2/10 in her left shoulder when completing daily activities.  Long Term Goal 4 Progress: Met Long Term Goal 5: Patient will decrease her restrictions to minimal in her left shoulder region.  Problem List Patient Active Problem List   Diagnosis Date Noted  . Status post rotator cuff repair 08/24/2012  . Muscle weakness (generalized) 03/24/2012  . Difficulty in walking 03/24/2012  . Pain in joint, shoulder region 03/24/2012    End of Session Activity Tolerance: Patient tolerated treatment well General Behavior During Therapy: [facility] for tasks assessed/performed Cognition: WFL for tasks performed  GO Functional Assessment Tool Used: UEFI 37/80 46.25% independent 53.75% impaired Functional Limitation: Self care Self Care Current Status [id number]): At least 40 percent but less than 60 percent impaired, limited or restricted Self Care Discharge Status [phone]): At least 20 percent but less than 40 percent impaired, limited or restricted  Ailene Ravel, OTR/L,CBIS   10/25/2012, 9:32 AM  Physician Documentation Your signature is  required to indicate approval of the treatment plan as stated above.  Please sign and either send electronically or make a copy of this report for your files and return this physician signed original.  Please mark one 1.__approve of plan  2. ___approve of plan with the following conditions.   ______________________________                                                          _____________________ Physician Signature                                                                                                             Date

## 2012-10-28 ENCOUNTER — Ambulatory Visit (HOSPITAL_COMMUNITY)
Admission: RE | Admit: 2012-10-28 | Discharge: 2012-10-28 | Disposition: A | Discharge status: Home / Self Care | Payer: Medicare Other | Source: Ambulatory Visit | Attending: Family Medicine | Admitting: Family Medicine

## 2012-10-28 DIAGNOSIS — Z9889 Other specified postprocedural states: Secondary | ICD-10-CM

## 2012-10-28 NOTE — Progress Notes (Signed)
Occupational Therapy Treatment Patient Details  Name: [name] MRN: [id number] Date of Birth: 1924-05-08  Today's Date: 10/28/2012 Time: [id number] OT Time Calculation (min): 38 min Manual Therapy [phone] 12' Therapeutic exercises [phone] 26' Visit#: 13 of 24  Re-eval: 11/22/12    Authorization: [employer]  Authorization Time Period: before 29th visit  Authorization Visit#: 13 of 27  Subjective S:  I want to be able to reach overhead.  Limitations: Per MD, ok to begin strengthening exercises.  However, we will begin with PROM, progress to AAROM, AROM, strengthening as pain and shoulder mobility permits.  Pain Assessment Currently in Pain?: No/denies Pain Score: 0-No pain  Precautions/Restrictions   n/a  Exercise/Treatments Supine Protraction: PROM;10 reps;AROM;15 reps Horizontal ABduction: PROM;10 reps;AROM;15 reps Horizontal ABduction Limitations: with min facilitation from OTR/L External Rotation: PROM;10 reps;AROM;15 reps Internal Rotation: PROM;10 reps;AROM;15 reps Flexion: PROM;10 reps;AROM;15 reps ABduction: PROM;10 reps;AROM;15 reps Seated Extension: Theraband;10 reps Theraband Level (Shoulder Extension): Level 2 (Red) Row: Theraband;15 reps Theraband Level (Shoulder Row): Level 2 (Red) Protraction: AAROM;15 reps Horizontal ABduction: AAROM;15 reps External Rotation: AAROM;15 reps Internal Rotation: AAROM;15 reps Flexion: AAROM;15 reps;Limitations Flexion Limitations: with facilitation from OTR/L and use of dowel rod Abduction: AAROM;15 reps Other Seated Exercises: functional reaching activity into varying positions of flexion and abduction 10 times 2 sets  ROM / Strengthening / Isometric Strengthening Proximal Shoulder Strengthening, Supine: 10 reps with min facilitation from OTR/L Rhythmic Stabilization, Supine: 15 seconds with arm flexed to 90 and 10 seconds with arm abducted and shoulder in neutral rotation      Manual Therapy Manual Therapy:  Myofascial release Myofascial Release: Myofascial release and manual stretching to left shoulder and scapular region to decrease pain and fascial restrictions and increase pain free mobility  Occupational Therapy Assessment and Plan OT Assessment and Plan Clinical Impression Statement: A:  Patient completed reaching activity in seated position, has max difficulty extending her elbow when reaching as arm gets heavier.   OT Plan: P:  Increase independence with functional reaching exercises/activities.   Goals Short Term Goals Time to Complete Short Term Goals: 4 weeks Short Term Goal 1: Patient will be educated on a HEP. Short Term Goal 2: Patient will increase left shoulder PROM to [facility] for increased ability to don and doff clothing independently. Short Term Goal 3: Patient will increase left shoulder strength to 3+/5 for increased ability to push herself out of a chair. Short Term Goal 4: Patient will decrease pain in her left shoulder to 3/10 when combing her hair. Short Term Goal 5: Patient will decrease fascial restrictions to moderate in her left shoulder region.  Long Term Goals Time to Complete Long Term Goals: 8 weeks Long Term Goal 1: Patient will return to her prior level of independence with her B/IADLs and leisure activiites.  Long Term Goal 2: Patient will increase her left shoulder AROM to [facility] for increased ability to reach into overhead cabinets. Long Term Goal 3: Patient will increase her left shoulder strength to 4+/5 for increased ability to pull up her covers. Long Term Goal 4: Patient will decrease her pain to 2/10 in her left shoulder when completing daily activities.  Long Term Goal 5: Patient will decrease her restrictions to minimal in her left shoulder region.  Problem List Patient Active Problem List   Diagnosis Date Noted  . Status post rotator cuff repair 08/24/2012  . Muscle weakness (generalized) 03/24/2012  . Difficulty in walking 03/24/2012  . Pain in  joint, shoulder region 03/24/2012  End of Session Activity Tolerance: Patient tolerated treatment well General Behavior During Therapy: WFL for tasks assessed/performed Cognition: WFL for tasks performed  [place], OTR/L  10/28/2012, 9:34 AM

## 2012-11-01 ENCOUNTER — Other Ambulatory Visit: Payer: Self-pay | Admitting: *Deleted

## 2012-11-02 ENCOUNTER — Ambulatory Visit (HOSPITAL_COMMUNITY)
Admission: RE | Admit: 2012-11-02 | Discharge: 2012-11-02 | Disposition: A | Discharge status: Home / Self Care | Payer: Medicare Other | Source: Ambulatory Visit | Attending: Orthopedic Surgery | Admitting: Orthopedic Surgery

## 2012-11-02 NOTE — Progress Notes (Signed)
Occupational Therapy Treatment Patient Details  Name: [name] MRN: [id number] Date of Birth: 04/10/1924  Today's Date: 11/02/2012 Time: [id number] OT Time Calculation (min): 40 min Manual Therapy [phone] 15' Therapeutic Exercises [phone]' Visit#: 14 of 24  Re-eval: 11/22/12    Authorization: [employer]  Authorization Time Period: before 17th visit  Authorization Visit#: 14 of 17  Subjective S:  It feels so good when you massage it. Limitations: Per MD, ok to begin strengthening exercises.  However, we will begin with PROM, progress to AAROM, AROM, strengthening as pain and shoulder mobility permits.  Pain Assessment Currently in Pain?: No/denies Pain Score: 0-No pain  Precautions/Restrictions    Per MD, ok to begin strengthening exercises.  However, we will begin with PROM, progress to AAROM, AROM, strengthening as pain and shoulder mobility permits.   Exercise/Treatments Supine Protraction: PROM;10 reps;AROM;15 reps Horizontal ABduction: PROM;10 reps;AROM;15 reps Horizontal ABduction Limitations: with min facilitation from OTR/L External Rotation: PROM;10 reps;AROM;15 reps Internal Rotation: PROM;10 reps;AROM;15 reps Flexion: PROM;10 reps;AROM;15 reps Flexion Limitations: min facilitation from OTR/L wtih AAROM ABduction: PROM;AROM;10 reps ABduction Limitations: min facilitation from OTR/L Seated Extension: Theraband;15 reps Theraband Level (Shoulder Extension): Level 2 (Red) Row: Theraband;15 reps Theraband Level (Shoulder Row): Level 2 (Red) Protraction: AAROM;15 reps;Limitations Protraction Limitations: with facilitation from OTR/L and dowel  Horizontal ABduction: AAROM;Limitations;10 reps Horizontal ABduction Limitations: with fingertip assist from OTR/L on dowel External Rotation: Theraband;15 reps Theraband Level (Shoulder External Rotation): Level 2 (Red) Internal Rotation: Theraband;10 reps Theraband Level (Shoulder Internal Rotation): Level 2  (Red) Flexion: AAROM;15 reps;Limitations Flexion Limitations: with facilitation from OTR/L and use of dowel rod Other Seated Exercises: functional reaching activity into varying positions of flexion and abduction 10 times 2 sets  Therapy Ball Flexion: 20 reps ABduction: 20 reps ROM / Strengthening / Isometric Strengthening Proximal Shoulder Strengthening, Supine: 10 reps with min facilitation from OTR/L Rhythmic Stabilization, Supine: 30 seconds with arm flexed to 90 and 30 seconds with arm in neutral rotation      Manual Therapy Manual Therapy: Myofascial release Myofascial Release: Myofascial release and manual stretching to left shoulder and scapular region to decrease pain and fascial restrictions and increase pain free mobility  Occupational Therapy Assessment and Plan OT Assessment and Plan Clinical Impression Statement: A:  Patient increasing independence with scapular stability exercises, requiring less facilitation.   OT Plan: P:  Attempt UBE for AAROM rather than strengthening.     Goals Short Term Goals Time to Complete Short Term Goals: 4 weeks Short Term Goal 1: Patient will be educated on a HEP. Short Term Goal 2: Patient will increase left shoulder PROM to [facility] for increased ability to don and doff clothing independently. Short Term Goal 3: Patient will increase left shoulder strength to 3+/5 for increased ability to push herself out of a chair. Short Term Goal 4: Patient will decrease pain in her left shoulder to 3/10 when combing her hair. Short Term Goal 5: Patient will decrease fascial restrictions to moderate in her left shoulder region.  Long Term Goals Time to Complete Long Term Goals: 8 weeks Long Term Goal 1: Patient will return to her prior level of independence with her B/IADLs and leisure activiites.  Long Term Goal 1 Progress: Progressing toward goal Long Term Goal 2: Patient will increase her left shoulder AROM to [facility] for increased ability to reach into  overhead cabinets. Long Term Goal 2 Progress: Progressing toward goal Long Term Goal 3: Patient will increase her left shoulder strength to 4+/5  for increased ability to pull up her covers. Long Term Goal 3 Progress: Progressing toward goal Long Term Goal 4: Patient will decrease her pain to 2/10 in her left shoulder when completing daily activities.  Long Term Goal 5: Patient will decrease her restrictions to minimal in her left shoulder region.  Problem List Patient Active Problem List   Diagnosis Date Noted  . Status post rotator cuff repair 08/24/2012  . Muscle weakness (generalized) 03/24/2012  . Difficulty in walking 03/24/2012  . Pain in joint, shoulder region 03/24/2012    End of Session Activity Tolerance: Patient tolerated treatment well General Behavior During Therapy: [facility] for tasks assessed/performed Cognition: WFL for tasks performed  [place], OTR/L  11/02/2012, 10:38 AM

## 2012-11-04 ENCOUNTER — Ambulatory Visit (HOSPITAL_COMMUNITY)
Admission: RE | Admit: 2012-11-04 | Discharge: 2012-11-04 | Disposition: A | Discharge status: Home / Self Care | Payer: Medicare Other | Source: Ambulatory Visit | Attending: Orthopedic Surgery | Admitting: Orthopedic Surgery

## 2012-11-04 DIAGNOSIS — Z9889 Other specified postprocedural states: Secondary | ICD-10-CM

## 2012-11-04 NOTE — Progress Notes (Signed)
Occupational Therapy Treatment Patient Details  Name: [name] MRN: [id number] Date of Birth: [birth year]  Today's Date: 11/04/2012 Time: [id number] OT Time Calculation (min): 47 min MFR 848-900 12' Therex 900-935 35'  Visit#: 15 of 24  Re-eval: 11/22/12    Authorization: [employer]  Authorization Time Period: before 17th visit  Authorization Visit#: 15 of 17  Subjective Symptoms/Limitations Symptoms: S: I feel like I'm doing good but I still need to work on my strength. Pain Assessment Currently in Pain?: No/denies  Precautions/Restrictions  Precautions Precautions: Shoulder;Fall  Exercise/Treatments Supine Protraction: PROM;AAROM;10 reps Horizontal ABduction: PROM;AAROM;10 reps External Rotation: PROM;AAROM;10 reps Internal Rotation: PROM;AAROM;10 reps Flexion: PROM;AAROM;10 reps ABduction: PROM;AROM;10 reps Seated Extension: Theraband;15 reps Theraband Level (Shoulder Extension): Level 2 (Red) Row: Theraband;15 reps Theraband Level (Shoulder Row): Level 2 (Red) Protraction: AROM;15 reps Horizontal ABduction: Theraband;15 reps Theraband Level (Shoulder Horizontal ABduction): Level 2 (Red) External Rotation: Theraband;15 reps Theraband Level (Shoulder External Rotation): Level 2 (Red) Internal Rotation: Theraband;15 reps Theraband Level (Shoulder Internal Rotation): Level 2 (Red)    Manual Therapy Manual Therapy: Myofascial release Myofascial Release: Myofascial release and manual stretching to left shoulder and scapular region to decrease pain and fascial restrictions and increase pain free mobility  Occupational Therapy Assessment and Plan OT Assessment and Plan Clinical Impression Statement: A: Performed AAROM supine without facilitation from OT. Performed theraband seated without facilitation from OT. Added UBE. Patient tolerated well.  OT Plan: P: Work on reaching above shoulder height with proper form.   Goals Short Term Goals Time to Complete  Short Term Goals: 4 weeks Short Term Goal 1: Patient will be educated on a HEP. Short Term Goal 2: Patient will increase left shoulder PROM to [employer] for increased ability to don and doff clothing independently. Short Term Goal 3: Patient will increase left shoulder strength to 3+/5 for increased ability to push herself out of a chair. Short Term Goal 4: Patient will decrease pain in her left shoulder to 3/10 when combing her hair. Short Term Goal 5: Patient will decrease fascial restrictions to moderate in her left shoulder region.  Long Term Goals Time to Complete Long Term Goals: 8 weeks Long Term Goal 1: Patient will return to her prior level of independence with her B/IADLs and leisure activiites.  Long Term Goal 2: Patient will increase her left shoulder AROM to [facility] for increased ability to reach into overhead cabinets. Long Term Goal 3: Patient will increase her left shoulder strength to 4+/5 for increased ability to pull up her covers. Long Term Goal 4: Patient will decrease her pain to 2/10 in her left shoulder when completing daily activities.  Long Term Goal 5: Patient will decrease her restrictions to minimal in her left shoulder region.  Problem List Patient Active Problem List   Diagnosis Date Noted  . Status post rotator cuff repair 08/24/2012  . Muscle weakness (generalized) 03/24/2012  . Difficulty in walking 03/24/2012  . Pain in joint, shoulder region 03/24/2012    End of Session Activity Tolerance: Patient tolerated treatment well General Behavior During Therapy: [facility] for tasks assessed/performed Cognition: [facility] for tasks performed   Ailene Ravel, OTR/L,CBIS   11/04/2012, 10:25 AM

## 2012-11-09 ENCOUNTER — Ambulatory Visit (HOSPITAL_COMMUNITY)
Admission: RE | Admit: 2012-11-09 | Discharge: 2012-11-09 | Disposition: A | Discharge status: Home / Self Care | Payer: Medicare Other | Source: Ambulatory Visit | Attending: Orthopedic Surgery | Admitting: Orthopedic Surgery

## 2012-11-09 DIAGNOSIS — Z9889 Other specified postprocedural states: Secondary | ICD-10-CM

## 2012-11-09 NOTE — Progress Notes (Signed)
Occupational Therapy Treatment Patient Details  Name: [name] MRN: [id number] Date of Birth: 25-Nov-1923  Today's Date: 11/09/2012 Time: [id number] OT Time Calculation (min): 43 min MFR [id number] 15' Therex [id number] 28'  Visit#: 16 of 24  Re-eval: 11/22/12    Authorization: [employer]  Authorization Time Period: before 17th visit  Authorization Visit#: 16 of 17  Subjective Symptoms/Limitations Symptoms: S: My shoulder feels good today. Pain Assessment Currently in Pain?: No/denies  Precautions/Restrictions  Precautions Precautions: Shoulder;Fall  Exercise/Treatments Supine Protraction: PROM;10 reps;AROM;15 reps Horizontal ABduction: PROM;AROM;10 reps External Rotation: PROM;AROM;15 reps Internal Rotation: PROM;AROM;15 reps Flexion: PROM;AROM;10 reps ABduction: PROM;AROM;10 reps;Limitations ABduction Limitations: able to complete 90 degree without assist Seated Other Seated Exercises: in seated reaching to touch therapists hand in varying positions of flexion and abduction.  15 times 2 sets.  VG after first set to extend elbow fully when reaching.   ROM / Strengthening / Isometric Strengthening UBE (Upper Arm Bike): 1.0 3' forward 3' reverse Proximal Shoulder Strengthening, Supine: unable to perform this session due to fatigue       Manual Therapy Manual Therapy: Myofascial release Myofascial Release: Myofascial release and manual stretching to left shoulder and scapular region to decrease pain and fascial restrictions and increase pain free mobility  Occupational Therapy Assessment and Plan OT Assessment and Plan Clinical Impression Statement: A: Performed all AROM exercises supine. Patient continues to have increase weakness in left shoulder causing patient to fatigue quickly during exercises. patient required vc's to extend elbow during AROM to activate shoulder muscles. Added UBE to increase strength and ROM.  OT Plan: P: Work on reaching above shoulder  height with proper form. UPDATE G-CODE.   Goals Short Term Goals Time to Complete Short Term Goals: 4 weeks Short Term Goal 1: Patient will be educated on a HEP. Short Term Goal 2: Patient will increase left shoulder PROM to [facility] for increased ability to don and doff clothing independently. Short Term Goal 3: Patient will increase left shoulder strength to 3+/5 for increased ability to push herself out of a chair. Short Term Goal 4: Patient will decrease pain in her left shoulder to 3/10 when combing her hair. Short Term Goal 4 Progress: Progressing toward goal Short Term Goal 5: Patient will decrease fascial restrictions to moderate in her left shoulder region.  Long Term Goals Time to Complete Long Term Goals: 8 weeks Long Term Goal 1: Patient will return to her prior level of independence with her B/IADLs and leisure activiites.  Long Term Goal 1 Progress: Progressing toward goal Long Term Goal 2: Patient will increase her left shoulder AROM to [facility] for increased ability to reach into overhead cabinets. Long Term Goal 2 Progress: Progressing toward goal Long Term Goal 3: Patient will increase her left shoulder strength to 4+/5 for increased ability to pull up her covers. Long Term Goal 3 Progress: Progressing toward goal Long Term Goal 4: Patient will decrease her pain to 2/10 in her left shoulder when completing daily activities.  Long Term Goal 5: Patient will decrease her restrictions to minimal in her left shoulder region.  Problem List Patient Active Problem List   Diagnosis Date Noted  . Status post rotator cuff repair 08/24/2012  . Muscle weakness (generalized) 03/24/2012  . Difficulty in walking 03/24/2012  . Pain in joint, shoulder region 03/24/2012    End of Session Activity Tolerance: Patient tolerated treatment well General Behavior During Therapy: [facility] for tasks assessed/performed Cognition: [facility] for tasks performed   [employer], OTR/L,CBIS  11/09/2012, 9:32  AM

## 2012-11-11 ENCOUNTER — Ambulatory Visit (HOSPITAL_COMMUNITY): Payer: Medicare Other | Admitting: Specialist

## 2012-11-16 ENCOUNTER — Ambulatory Visit (HOSPITAL_COMMUNITY)
Admission: RE | Admit: 2012-11-16 | Discharge: 2012-11-16 | Disposition: A | Discharge status: Home / Self Care | Payer: Medicare Other | Source: Ambulatory Visit | Attending: Orthopedic Surgery | Admitting: Orthopedic Surgery

## 2012-11-16 DIAGNOSIS — Z9889 Other specified postprocedural states: Secondary | ICD-10-CM

## 2012-11-16 NOTE — Progress Notes (Signed)
Occupational Therapy Treatment Patient Details  Name: [name] MRN: [id number] Date of Birth: [birth year]  Today's Date: 11/16/2012 Time: [id number] OT Time Calculation (min): 48 min Reassess/G-code 900-910 10' MFR 910-920 10' Therex [id number] 28'  Visit#: 61 of 24  Re-eval: 11/22/12    Authorization: [employer]  Authorization Time Period: before 27th visit  Authorization Visit#: 17 of 27  Subjective Symptoms/Limitations Symptoms: S: This weekend I had a fall. I was walking down the side steps and I thought I had ahold of the railing, but I didn't. I feel right down on the ground. Pain Assessment Currently in Pain?: Yes Pain Score:   3 Pain Location: Shoulder Pain Orientation: Left Pain Type: Acute pain  Precautions/Restrictions  Precautions Precautions: Shoulder;Fall  Exercise/Treatments Supine Protraction: PROM;10 reps;AROM;15 reps Horizontal ABduction: PROM;AROM;10 reps Horizontal ABduction Limitations: with min facilitation from OTR/L External Rotation: PROM;AROM;15 reps Internal Rotation: PROM;AROM;15 reps Flexion: PROM;AROM;15 reps ABduction: PROM;10 reps;AROM;15 reps;Limitations ABduction Limitations: able to complete 90 degree without assist ROM / Strengthening / Isometric Strengthening UBE (Upper Arm Bike): 1.5 3' forward 3' reverse Proximal Shoulder Strengthening, Supine: unable to perform this session due to fatigue Rhythmic Stabilization, Supine: unable to perform due to fatigue      Manual Therapy Manual Therapy: Myofascial release Myofascial Release: Myofascial release and manual stretching to left shoulder and scapular region to decrease pain and fascial restrictions and increase pain free mobility  Occupational Therapy Assessment and Plan OT Assessment and Plan Clinical Impression Statement: A: Patient had increased difficulty completly supine AROM exercises due to not attending therapy for a week. Patient did have increase PROM this date.  Updtated g-code. Patient's score has increased.  OT Plan: P: Work on reaching above shoulder height with proper form. REASSESS   Goals Short Term Goals Time to Complete Short Term Goals: 4 weeks Short Term Goal 1: Patient will be educated on a HEP. Short Term Goal 2: Patient will increase left shoulder PROM to [facility] for increased ability to don and doff clothing independently. Short Term Goal 3: Patient will increase left shoulder strength to 3+/5 for increased ability to push herself out of a chair. Short Term Goal 4: Patient will decrease pain in her left shoulder to 3/10 when combing her hair. Short Term Goal 4 Progress: Progressing toward goal Short Term Goal 5: Patient will decrease fascial restrictions to moderate in her left shoulder region.  Long Term Goals Time to Complete Long Term Goals: 8 weeks Long Term Goal 1: Patient will return to her prior level of independence with her B/IADLs and leisure activiites.  Long Term Goal 1 Progress: Progressing toward goal Long Term Goal 2: Patient will increase her left shoulder AROM to [facility] for increased ability to reach into overhead cabinets. Long Term Goal 2 Progress: Progressing toward goal Long Term Goal 3: Patient will increase her left shoulder strength to 4+/5 for increased ability to pull up her covers. Long Term Goal 3 Progress: Progressing toward goal Long Term Goal 4: Patient will decrease her pain to 2/10 in her left shoulder when completing daily activities.  Long Term Goal 5: Patient will decrease her restrictions to minimal in her left shoulder region.  Problem List Patient Active Problem List   Diagnosis Date Noted  . Status post rotator cuff repair 08/24/2012  . Muscle weakness (generalized) 03/24/2012  . Difficulty in walking 03/24/2012  . Pain in joint, shoulder region 03/24/2012    End of Session Activity Tolerance: Patient tolerated treatment well General Behavior During Therapy:  WFL for tasks  assessed/performed Cognition: WFL for tasks performed  GO Functional Assessment Tool Used: UEFI 44/80 55% independent 45% impaired Functional Limitation: Self care Self Care Current Status [id number]): At least 40 percent but less than 60 percent impaired, limited or restricted Self Care Goal Status [id number]): At least 20 percent but less than 40 percent impaired, limited or restricted  Ailene Ravel, OTR/L,CBIS   11/16/2012, 9:46 AM

## 2012-11-18 ENCOUNTER — Ambulatory Visit (HOSPITAL_COMMUNITY)
Admission: RE | Admit: 2012-11-18 | Discharge: 2012-11-18 | Disposition: A | Discharge status: Home / Self Care | Payer: Medicare Other | Source: Ambulatory Visit | Attending: Orthopedic Surgery | Admitting: Orthopedic Surgery

## 2012-11-18 NOTE — Evaluation (Signed)
Occupational Therapy Re-Evaluation  Patient Details  Name: KERYL BREDESEN MRN: [id number] Date of Birth: [birth year]  Today's Date: 11/18/2012 Time: [id number] OT Time Calculation (min): 45 min MFR 935-947 12' Therex [id number] 7' Reassess [phone] 26'  Visit#: 18 of 24  Re-eval: 11/22/12  Assessment Diagnosis: S/P Left RCR  Authorization: [employer]  Authorization Time Period: before 27th visit  Authorization Visit#: 49 of 58   Past Medical History:  Past Medical History  Diagnosis Date  . Hypertension   . GERD (gastroesophageal reflux disease)   . Arthritis   . Slipped intervertebral disc   . Bilateral carpal tunnel syndrome   . Glaucoma   . Cataracts, both eyes   . 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 Diabetes mellitus ORAL MED  . Gout   . Diastolic heart failure CARDIOLOGIST- DR Daneen Schick---  LAST VISIT NOTE W/ CHART  . Heart murmur   . History of falling RECENT FALL 1 WK AGO--   NO INJURY  . Right leg weakness   . Walker as ambulation aid   . At risk for falling   . Diabetic neuropathy both hands and legs  . Hyperlipidemia   . Rotator cuff tear     left  . History of ulcer disease     many yrs ago  . History of transfusion    Past Surgical History:  Past Surgical History  Procedure Laterality Date  . Carpal tunnel release  09/17/2011    Procedure: CARPAL TUNNEL RELEASE;  Surgeon: Magnus Sinning, MD;  Location: [place];  Service: Orthopedics;  Laterality: Right;  . Abdominal hysterectomy  1966  . Transthoracic echocardiogram  12-26-2008  DR Daneen Schick    NORMAL LVF/  EF  71%/   MILD ASYMMETRIC SEPTAL HYPERTROPHY/ MILD LEFT ATRIAL ENLARGEMENT/ MODERATELY ELEVATED ESTIMATED RIGHT VENTRICULAR SYSTOLIC PRESSURE/ MILD MITRAL  &  TRICUSPID VALVE REGURG.  . Cardiovascular stress test  06-18-2005   DR Daneen Schick    NORMAL STUDY/ NO EVIDENCE ISCHEMIA/ EF 75%  . Carpal tunnel release  10/22/2011    Procedure: CARPAL  TUNNEL RELEASE;  Surgeon: Magnus Sinning, MD;  Location: [place];  Service: Orthopedics;  Laterality: Left;  . Lumbar laminectomy/decompression microdiscectomy  01/14/2012    Procedure: LUMBAR LAMINECTOMY/DECOMPRESSION MICRODISCECTOMY 3 LEVELS;  Surgeon: Magnus Sinning, MD;  Location: [facility];  Service: Orthopedics;  Laterality: N/A;  Decompression Laminectomy of L2 - L3, L3 - L4 and L4 - L5 Central  (X-Ray)  . Appendectomy    . Shoulder open rotator cuff repair  06/02/2012    Procedure: ROTATOR CUFF REPAIR SHOULDER OPEN;  Surgeon: Magnus Sinning, MD;  Location: [facility];  Service: Orthopedics;  Laterality: Left;  Left Shoulder Open Distal Clavicle Resection Anterior Acrominectomy and Rotator Cuff Repair    Subjective Symptoms/Limitations Symptoms: S: I'm still sore from tuesday's treatment session.  Pain Assessment Currently in Pain?: Yes Pain Score:   6 Pain Location: Shoulder Pain Orientation: Left Pain Type: Acute pain  Precautions/Restrictions  Precautions Precautions: Shoulder;Fall  Assessment Additional Assessments LUE AROM (degrees) LUE Overall AROM Comments: assessed in supine with shoulder adducted for external rotation and  Left Shoulder Flexion: 135 Degrees (last progress note: 145) Left Shoulder ABduction: 110 Degrees (last progress note: 110) Left Shoulder Internal Rotation: 90 Degrees (same as last progress note) Left Shoulder External Rotation: 90 Degrees (same as last progress note) LUE PROM (degrees) LUE  Overall PROM Comments: assessed in supine, external rotation and internal rotation with shoulder adducted Left Shoulder Flexion: 158 Degrees (last progress note: 155) Left Shoulder ABduction: 130 Degrees (last progress note: 130) Left Shoulder Internal Rotation: 90 Degrees (same as last progress note) Left Shoulder External Rotation: 90 Degrees (same as last progress note) LUE Strength LUE Overall Strength Comments: last progress note: 3/5  in all ranges. Left Shoulder Flexion:  (4+/5 () Left Shoulder ABduction:  (4+/5) Left Shoulder Internal Rotation:  (4+/5) Left Shoulder External Rotation: 4/5 (4+/5) Palpation Palpation: trace restrictions in upper arm region     Exercise/Treatments Reviewed theraband HEP with patient demonstrating exercises to therapist.     Manual Therapy Manual Therapy: Myofascial release Myofascial Release: Myofascial release and manual stretching to left shoulder and scapular region to decrease pain and fascial restrictions and increase pain free mobility  Occupational Therapy Assessment and Plan OT Assessment and Plan Clinical Impression Statement: A: See MD note for progress. Patient has requested to be discharged from therapy this date. patient was given red theraband for a HEP in a addition to what she has been doing at home.  OT Plan: P: D/C from therapy.    Goals Short Term Goals Time to Complete Short Term Goals: 4 weeks Short Term Goal 1: Patient will be educated on a HEP. Short Term Goal 2: Patient will increase left shoulder PROM to [facility] for increased ability to don and doff clothing independently. Short Term Goal 3: Patient will increase left shoulder strength to 3+/5 for increased ability to push herself out of a chair. Short Term Goal 4: Patient will decrease pain in her left shoulder to 3/10 when combing her hair. Short Term Goal 4 Progress: Met Short Term Goal 5: Patient will decrease fascial restrictions to moderate in her left shoulder region.  Long Term Goals Time to Complete Long Term Goals: 8 weeks Long Term Goal 1: Patient will return to her prior level of independence with her B/IADLs and leisure activiites.  Long Term Goal 1 Progress: Progressing toward goal Long Term Goal 2: Patient will increase her left shoulder AROM to [facility] for increased ability to reach into overhead cabinets. Long Term Goal 2 Progress: Partly met Long Term Goal 3: Patient will increase her left  shoulder strength to 4+/5 for increased ability to pull up her covers. Long Term Goal 3 Progress: Met Long Term Goal 4: Patient will decrease her pain to 2/10 in her left shoulder when completing daily activities.  Long Term Goal 5: Patient will decrease her restrictions to minimal in her left shoulder region.  Problem List Patient Active Problem List   Diagnosis Date Noted  . Status post rotator cuff repair 08/24/2012  . Muscle weakness (generalized) 03/24/2012  . Difficulty in walking 03/24/2012  . Pain in joint, shoulder region 03/24/2012    End of Session Activity Tolerance: Patient tolerated treatment well General Behavior During Therapy: [facility] for tasks assessed/performed Cognition: WFL for tasks performed  GO Functional Assessment Tool Used: UEFI 44/80 55% independent 45% impaired Functional Limitation: Self care Self Care Current Status [id number]): At least 40 percent but less than 60 percent impaired, limited or restricted Self Care Goal Status [id number]): At least 20 percent but less than 40 percent impaired, limited or restricted Self Care Discharge Status [phone]): At least 20 percent but less than 40 percent impaired, limited or restricted  Ailene Ravel, OTR/L,CBIS   11/18/2012, 10:25 AM  Physician Documentation Your signature is required to indicate approval of the treatment  plan as stated above.  Please sign and either send electronically or make a copy of this report for your files and return this physician signed original.  Please mark one 1.__approve of plan  2. ___approve of plan with the following conditions.   ______________________________                                                          _____________________ Physician Signature                                                                                                             Date

## 2012-12-22 ENCOUNTER — Other Ambulatory Visit: Payer: Self-pay | Admitting: Geriatric Medicine

## 2013-07-04 ENCOUNTER — Other Ambulatory Visit (HOSPITAL_BASED_OUTPATIENT_CLINIC_OR_DEPARTMENT_OTHER): Payer: Self-pay | Admitting: Internal Medicine

## 2014-01-14 ENCOUNTER — Encounter: Payer: Self-pay | Admitting: *Deleted

## 2014-03-04 ENCOUNTER — Emergency Department (HOSPITAL_COMMUNITY): Payer: Medicare Other

## 2014-03-04 ENCOUNTER — Encounter (HOSPITAL_COMMUNITY): Payer: Self-pay | Admitting: Emergency Medicine

## 2014-03-04 ENCOUNTER — Emergency Department (HOSPITAL_COMMUNITY)
Admission: EM | Admit: 2014-03-04 | Discharge: 2014-03-04 | Disposition: A | Discharge status: Home / Self Care | Payer: Medicare Other | Attending: Emergency Medicine | Admitting: Emergency Medicine

## 2014-03-04 DIAGNOSIS — Z791 Long term (current) use of non-steroidal anti-inflammatories (NSAID): Secondary | ICD-10-CM | POA: Diagnosis not present

## 2014-03-04 DIAGNOSIS — M1990 Unspecified osteoarthritis, unspecified site: Secondary | ICD-10-CM | POA: Diagnosis not present

## 2014-03-04 DIAGNOSIS — M109 Gout, unspecified: Secondary | ICD-10-CM | POA: Diagnosis not present

## 2014-03-04 DIAGNOSIS — M79605 Pain in left leg: Secondary | ICD-10-CM | POA: Diagnosis present

## 2014-03-04 DIAGNOSIS — I5030 Unspecified diastolic (congestive) heart failure: Secondary | ICD-10-CM | POA: Diagnosis not present

## 2014-03-04 DIAGNOSIS — Z87891 Personal history of nicotine dependence: Secondary | ICD-10-CM | POA: Insufficient documentation

## 2014-03-04 DIAGNOSIS — E785 Hyperlipidemia, unspecified: Secondary | ICD-10-CM | POA: Insufficient documentation

## 2014-03-04 DIAGNOSIS — Z8673 Personal history of transient ischemic attack (TIA), and cerebral infarction without residual deficits: Secondary | ICD-10-CM | POA: Insufficient documentation

## 2014-03-04 DIAGNOSIS — R011 Cardiac murmur, unspecified: Secondary | ICD-10-CM | POA: Diagnosis not present

## 2014-03-04 DIAGNOSIS — I10 Essential (primary) hypertension: Secondary | ICD-10-CM | POA: Insufficient documentation

## 2014-03-04 DIAGNOSIS — M5442 Lumbago with sciatica, left side: Secondary | ICD-10-CM | POA: Diagnosis not present

## 2014-03-04 DIAGNOSIS — Z79899 Other long term (current) drug therapy: Secondary | ICD-10-CM | POA: Diagnosis not present

## 2014-03-04 DIAGNOSIS — K219 Gastro-esophageal reflux disease without esophagitis: Secondary | ICD-10-CM | POA: Insufficient documentation

## 2014-03-04 DIAGNOSIS — Z9181 History of falling: Secondary | ICD-10-CM | POA: Insufficient documentation

## 2014-03-04 DIAGNOSIS — E1140 Type 2 diabetes mellitus with diabetic neuropathy, unspecified: Secondary | ICD-10-CM | POA: Insufficient documentation

## 2014-03-04 DIAGNOSIS — H409 Unspecified glaucoma: Secondary | ICD-10-CM | POA: Diagnosis not present

## 2014-03-04 DIAGNOSIS — Z7982 Long term (current) use of aspirin: Secondary | ICD-10-CM | POA: Insufficient documentation

## 2014-03-04 MED ORDER — MELOXICAM 7.5 MG PO TABS: 15.0000 mg | ORAL_TABLET | Freq: Every day | ORAL | Status: DC

## 2014-03-04 MED ORDER — HYDROCODONE-ACETAMINOPHEN 5-325 MG PO TABS
1.0000 | ORAL_TABLET | Freq: Once | ORAL | Status: AC
  Administered 2014-03-04: 1 via ORAL
  Filled 2014-03-04: qty 1

## 2014-03-04 MED ORDER — LORATADINE 10 MG PO TABS
10.0000 mg | ORAL_TABLET | Freq: Every day | ORAL | Status: DC
  Filled 2014-03-04: qty 1

## 2014-03-04 MED ORDER — HYDROCODONE-ACETAMINOPHEN 5-325 MG PO TABS: 1.0000 | ORAL_TABLET | Freq: Four times a day (QID) | ORAL | Status: DC | PRN

## 2014-03-04 MED ORDER — METHOCARBAMOL 500 MG PO TABS: 500.0000 mg | ORAL_TABLET | Freq: Two times a day (BID) | ORAL | Status: DC | PRN

## 2014-03-04 NOTE — Discharge Instructions (Signed)
Please follow up with your primary care physician in 1-2 days. If you do not have one please call the [place] number listed above. Please follow up with the [facility] to schedule a follow up appointment. Please take pain medication and/or muscle relaxants as prescribed and as needed for pain. Please do not drive on narcotic pain medication or on muscle relaxants. Please alternate the use of the muscle relaxer (Robaxin) and the narcotic pain medication (Norco). Please take the Mobic as prescribed. Eucerin or Aquaphor may help with dry skin and itching. Please read all discharge instructions and return precautions.   Sciatica Sciatica is pain, weakness, numbness, or tingling along the path of the sciatic nerve. The nerve starts in the lower back and runs down the back of each leg. The nerve controls the muscles in the lower leg and in the back of the knee, while also providing sensation to the back of the thigh, lower leg, and the sole of your foot. Sciatica is a symptom of another medical condition. For instance, nerve damage or certain conditions, such as a herniated disk or bone spur on the spine, pinch or put pressure on the sciatic nerve. This causes the pain, weakness, or other sensations normally associated with sciatica. Generally, sciatica only affects one side of the body. CAUSES   Herniated or slipped disc.  Degenerative disk disease.  A pain disorder involving the narrow muscle in the buttocks (piriformis syndrome).  Pelvic injury or fracture.  Pregnancy.  Tumor (rare). SYMPTOMS  Symptoms can vary from mild to very severe. The symptoms usually travel from the low back to the buttocks and down the back of the leg. Symptoms can include:  Mild tingling or dull aches in the lower back, leg, or hip.  Numbness in the back of the calf or sole of the foot.  Burning sensations in the lower back, leg, or hip.  Sharp pains in the lower back, leg, or  hip.  Leg weakness.  Severe back pain inhibiting movement. These symptoms may get worse with coughing, sneezing, laughing, or prolonged sitting or standing. Also, being overweight may worsen symptoms. DIAGNOSIS  Your caregiver will perform a physical exam to look for common symptoms of sciatica. He or she may ask you to do certain movements or activities that would trigger sciatic nerve pain. Other tests may be performed to find the cause of the sciatica. These may include:  Blood tests.  X-rays.  Imaging tests, such as an MRI or CT scan. TREATMENT  Treatment is directed at the cause of the sciatic pain. Sometimes, treatment is not necessary and the pain and discomfort goes away on its own. If treatment is needed, your caregiver may suggest:  Over-the-counter medicines to relieve pain.  Prescription medicines, such as anti-inflammatory medicine, muscle relaxants, or narcotics.  Applying heat or ice to the painful area.  Steroid injections to lessen pain, irritation, and inflammation around the nerve.  Reducing activity during periods of pain.  Exercising and stretching to strengthen your abdomen and improve flexibility of your spine. Your caregiver may suggest losing weight if the extra weight makes the back pain worse.  Physical therapy.  Surgery to eliminate what is pressing or pinching the nerve, such as a bone spur or part of a herniated disk. HOME CARE INSTRUCTIONS   Only take over-the-counter or prescription medicines for pain or discomfort as directed by your caregiver.  Apply ice to the affected area for 20 minutes, 3-4 times a  day for the first 48-72 hours. Then try heat in the same way.  Exercise, stretch, or perform your usual activities if these do not aggravate your pain.  Attend physical therapy sessions as directed by your caregiver.  Keep all follow-up appointments as directed by your caregiver.  Do not wear high heels or shoes that do not provide proper  support.  Check your mattress to see if it is too soft. A firm mattress may lessen your pain and discomfort. SEEK IMMEDIATE MEDICAL CARE IF:   You lose control of your bowel or bladder (incontinence).  You have increasing weakness in the lower back, pelvis, buttocks, or legs.  You have redness or swelling of your back.  You have a burning sensation when you urinate.  You have pain that gets worse when you lie down or awakens you at night.  Your pain is worse than you have experienced in the past.  Your pain is lasting longer than 4 weeks.  You are suddenly losing weight without reason. MAKE SURE YOU:  Understand these instructions.  Will watch your condition.  Will get help right away if you are not doing well or get worse. Document Released: 05/06/2001 Document Revised: 11/11/2011 Document Reviewed: 09/21/2011 [facility] Patient Information 2015 [place], [place]. This information is not intended to replace advice given to you by your health care provider. Make sure you discuss any questions you have with your health care provider.   Back Pain, Adult Back pain is very common. The pain often gets better over time. The cause of back pain is usually not dangerous. Most people can learn to manage their back pain on their own.  HOME CARE   Stay active. Start with short walks on flat ground if you can. Try to walk farther each day.  Do not sit, drive, or stand in one place for more than 30 minutes. Do not stay in bed.  Do not avoid exercise or work. Activity can help your back heal faster.  Be careful when you bend or lift an object. Bend at your knees, keep the object close to you, and do not twist.  Sleep on a firm mattress. Lie on your side, and bend your knees. If you lie on your back, put a pillow under your knees.  Only take medicines as told by your doctor.  Put ice on the injured area.  Put ice in a plastic bag.  Place a towel between your skin and the bag.  Leave  the ice on for 15-20 minutes, 03-04 times a day for the first 2 to 3 days. After that, you can switch between ice and heat packs.  Ask your doctor about back exercises or massage.  Avoid feeling anxious or stressed. Find good ways to deal with stress, such as exercise. GET HELP RIGHT AWAY IF:   Your pain does not go away with rest or medicine.  Your pain does not go away in 1 week.  You have new problems.  You do not feel well.  The pain spreads into your legs.  You cannot control when you poop (bowel movement) or pee (urinate).  Your arms or legs feel weak or lose feeling (numbness).  You feel sick to your stomach (nauseous) or throw up (vomit).  You have belly (abdominal) pain.  You feel like you may pass out (faint). MAKE SURE YOU:   Understand these instructions.  Will watch your condition.  Will get help right away if you are not doing well or  get worse. Document Released: 10/29/2007 Document Revised: 08/04/2011 Document Reviewed: 09/13/2013 [facility] Patient Information 2015 [place], [place]. This information is not intended to replace advice given to you by your health care provider. Make sure you discuss any questions you have with your health care provider.

## 2014-03-04 NOTE — ED Notes (Signed)
Pt reports having left hip and leg pain x 2 days, denies injury or falling. Pt having difficulty bearing weight.

## 2014-03-04 NOTE — ED Provider Notes (Signed)
CSN: [id number]     Arrival date & time 03/04/14  1153 History   First MD Initiated Contact with Patient 03/04/14 1503     Chief Complaint  Patient presents with  . Leg Pain  . Hip Pain     (Consider location/radiation/quality/duration/timing/severity/associated sxs/prior Treatment) HPI Comments: Patient 80 year old female past medical history significant for hypertension, GERD, arthritis, intervertebral disc, DM, CHF hyper lipidemia, that he presented to the emergency department for left leg pain. Patient states starts in her low back, described as a sore achiness. She states she gets a radiating pain when ambulating. She states typically ambulates with a walker at home for assistance, she states that the pain she's not been able to ambulate. No falls or injuries. Patient states this pain feels similar to her visit in May of this year. Denies any fevers, chills, nausea, vomiting, chest pain or, no previous return to [place] for weakness changes from baseline, bladder or bowel incontinence, night sweats, history of cancer or IV drug use. She reports that she feels itchy all over, denies any rash.  Patient is a 80 y.o. female presenting with leg pain and hip pain.  Leg Pain Hip Pain Associated symptoms include arthralgias and myalgias. Pertinent negatives include no numbness or weakness.    Past Medical History  Diagnosis Date  . Hypertension   . GERD (gastroesophageal reflux disease)   . Arthritis   . Slipped intervertebral disc   . Bilateral carpal tunnel syndrome   . Glaucoma   . Cataracts, both eyes   . 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 Diabetes mellitus ORAL MED  . Gout   . Diastolic heart failure CARDIOLOGIST- DR Daneen Schick---  LAST VISIT NOTE W/ CHART  . Heart murmur   . History of falling RECENT FALL 1 WK AGO--   NO INJURY  . Right leg weakness   . Walker as ambulation aid   . At risk for falling   . Diabetic neuropathy both hands and legs  .  Hyperlipidemia   . Rotator cuff tear     left  . History of ulcer disease     many yrs ago  . History of transfusion    Past Surgical History  Procedure Laterality Date  . Carpal tunnel release  09/17/2011    Procedure: CARPAL TUNNEL RELEASE;  Surgeon: Magnus Sinning, MD;  Location: [place];  Service: Orthopedics;  Laterality: Right;  . Abdominal hysterectomy  1966  . Transthoracic echocardiogram  12-26-2008  DR Daneen Schick    NORMAL LVF/  EF  71%/   MILD ASYMMETRIC SEPTAL HYPERTROPHY/ MILD LEFT ATRIAL ENLARGEMENT/ MODERATELY ELEVATED ESTIMATED RIGHT VENTRICULAR SYSTOLIC PRESSURE/ MILD MITRAL  &  TRICUSPID VALVE REGURG.  . Cardiovascular stress test  06-18-2005   DR Daneen Schick    NORMAL STUDY/ NO EVIDENCE ISCHEMIA/ EF 75%  . Carpal tunnel release  10/22/2011    Procedure: CARPAL TUNNEL RELEASE;  Surgeon: Magnus Sinning, MD;  Location: [place];  Service: Orthopedics;  Laterality: Left;  . Lumbar laminectomy/decompression microdiscectomy  01/14/2012    Procedure: LUMBAR LAMINECTOMY/DECOMPRESSION MICRODISCECTOMY 3 LEVELS;  Surgeon: Magnus Sinning, MD;  Location: [facility];  Service: Orthopedics;  Laterality: N/A;  Decompression Laminectomy of L2 - L3, L3 - L4 and L4 - L5 Central  (X-Ray)  . Appendectomy    . Shoulder open rotator cuff repair  06/02/2012    Procedure: ROTATOR CUFF  REPAIR SHOULDER OPEN;  Surgeon: Magnus Sinning, MD;  Location: [facility];  Service: Orthopedics;  Laterality: Left;  Left Shoulder Open Distal Clavicle Resection Anterior Acrominectomy and Rotator Cuff Repair   Family History  Problem Relation Age of Onset  . Hypertension Father   . Hypertension Mother   . Diabetes Sister   . Hypertension Sister    History  Substance Use Topics  . Smoking status: Former Smoker -- 1.00 packs/day for 30 years    Types: Cigarettes    Quit date: 05/28/1968  . Smokeless tobacco: Never Used  . Alcohol Use: No   OB History   Grav Para  Term Preterm Abortions TAB SAB Ect Mult Living                 Review of Systems  Musculoskeletal: Positive for arthralgias and myalgias.  Neurological: Negative for weakness and numbness.  All other systems reviewed and are negative.     Allergies  Review of patient's allergies indicates no known allergies.  Home Medications   Prior to Admission medications   Medication Sig Start Date End Date Taking? Authorizing Provider  acetaminophen (TYLENOL) 500 MG tablet Take 1,500 mg by mouth every 6 (six) hours as needed for mild pain.   Yes Historical Provider, MD  allopurinol (ZYLOPRIM) 300 MG tablet Take 300 mg by mouth daily with breakfast.    Yes Historical Provider, MD  amLODipine (NORVASC) 10 MG tablet Take 10 mg by mouth every morning.    Yes Historical Provider, MD  aspirin EC 81 MG tablet Take 81 mg by mouth daily.   Yes Historical Provider, MD  b complex vitamins tablet Take 1 tablet by mouth daily.   Yes Historical Provider, MD  furosemide (LASIX) 40 MG tablet Take 40 mg by mouth daily.   Yes Historical Provider, MD  glipiZIDE (GLUCOTROL) 5 MG tablet Take 5 mg by mouth daily.   Yes Historical Provider, MD  hydrALAZINE (APRESOLINE) 10 MG tablet Take 10 mg by mouth daily.    Yes Historical Provider, MD  lisinopril (PRINIVIL,ZESTRIL) 40 MG tablet Take 40 mg by mouth every morning.    Yes Historical Provider, MD  meloxicam (MOBIC) 15 MG tablet Take 15 mg by mouth daily.   Yes Historical Provider, MD  metoprolol tartrate (LOPRESSOR) 25 MG tablet Take 12.5 mg by mouth daily.   Yes Historical Provider, MD  pravastatin (PRAVACHOL) 20 MG tablet Take 20 mg by mouth every evening.   Yes Historical Provider, MD  probenecid (BENEMID) 500 MG tablet Take 500 mg by mouth daily.    Yes Historical Provider, MD  ranitidine (ZANTAC) 150 MG tablet Take 150 mg by mouth daily.    Yes Historical Provider, MD  timolol (BETIMOL) 0.25 % ophthalmic solution Place 1 drop into the right eye 2 (two) times  daily.    Yes Historical Provider, MD  HYDROcodone-acetaminophen (NORCO/VICODIN) 5-325 MG per tablet Take 1-2 tablets by mouth every 6 (six) hours as needed for severe pain. 03/04/14   Parrish Daddario L Kindle Strohmeier, PA-C  meloxicam (MOBIC) 7.5 MG tablet Take 2 tablets (15 mg total) by mouth daily. 03/04/14   Kaysey Berndt L Sarayah Bacchi, PA-C  methocarbamol (ROBAXIN) 500 MG tablet Take 1 tablet (500 mg total) by mouth 2 (two) times daily as needed for muscle spasms. 03/04/14   Harmonii Karle L Somara Frymire, PA-C   BP 116/46  Pulse 59  Temp(Src) 97.6 F (36.4 C) (Oral)  Resp 16  SpO2 98% Physical Exam  Nursing note and vitals reviewed.  Constitutional: She is oriented to person, place, and time. She appears well-developed and well-nourished. No distress.  HENT:  Head: Normocephalic and atraumatic.  Right Ear: External ear normal.  Left Ear: External ear normal.  Nose: Nose normal.  Mouth/Throat: Oropharynx is clear and moist. No oropharyngeal exudate.  Eyes: Conjunctivae and EOM are normal. Pupils are equal, round, and reactive to light.  Neck: Normal range of motion. Neck supple.  Cardiovascular: Normal rate, regular rhythm, normal heart sounds and intact distal pulses.   Pulmonary/Chest: Effort normal and breath sounds normal. No respiratory distress.  Abdominal: Soft. There is no tenderness.  Musculoskeletal:       Cervical back: Normal.       Thoracic back: Normal.       Lumbar back: She exhibits tenderness and spasm. She exhibits normal range of motion, no bony tenderness, no swelling, no edema, no deformity, no laceration and normal pulse.       Back:  Negative straight leg test.   Neurological: She is alert and oriented to person, place, and time. She has normal strength. No cranial nerve deficit. GCS eye subscore is 4. GCS verbal subscore is 5. GCS motor subscore is 6.  Sensation grossly intact.  No pronator drift.  Bilateral heel-knee-shin intact.  Skin: Skin is warm and dry. She is not  diaphoretic.    ED Course  Procedures (including critical care time) Medications  HYDROcodone-acetaminophen (NORCO/VICODIN) 5-325 MG per tablet 1 tablet (1 tablet Oral Given 03/04/14 1531)    Labs Review Labs Reviewed - No data to display  Imaging Review Dg Hip Complete Left  03/04/2014   CLINICAL DATA:  Left hip pain worsening over the past 4 days. No known injury.  EXAM: LEFT HIP - COMPLETE 2+ VIEW  COMPARISON:  None.  FINDINGS: Both hips are normally located. Moderate degenerative changes with joint space narrowing and mild spurring no fracture or plain film evidence of avascular necrosis. The pubic symphysis and SI joints are intact. No definite pelvic fractures.  IMPRESSION: Bilateral hip joint degenerative changes but no definite fracture.   Electronically Signed   By: Kalman Jewels M.D.   On: 03/04/2014 14:01     EKG Interpretation None      MDM   Final diagnoses:  Left-sided low back pain with left-sided sciatica    Filed Vitals:   03/04/14 1627  BP: 116/46  Pulse: 59  Temp: 97.6 F (36.4 C)  Resp: 16   Afebrile, NAD, non-toxic appearing, AAOx4.   Patient with back pain.  No neurological deficits and normal neuro exam. No loss of bowel or bladder control.  No concern for cauda equina. No fever, night sweats, weight loss, h/o cancer, IVDU.  Bedside ultrasound performed by Dr. Sabra Heck of the abdominal aorta, without bedside evidence of diameter of aorta > 5 cm. Low suspicion for aortic or vascular cause of pain as back pain is reproducible.  RICE protocol and pain medicine indicated and discussed with patient. Patient is stable at time of discharge  Patient d/w with Dr. Sabra Heck, agrees with plan.    Harlow Mares, PA-C 03/05/14 [phone]

## 2014-03-04 NOTE — ED Provider Notes (Signed)
80 year old very pleasant female presents with recurrent left lateral thigh and buttock pain radiating into the front of the left thigh, this is intermittent, related to movement, seems to be worse when she tries to ambulate. She denies any numbness or weakness of the lower extremity, no urinary symptoms, no fever, has history of lumbar surgery a couple of years ago for similar symptoms which got better at that time. On exam the patient has a soft nontender abdomen with pulsating mass, but that ultrasound reveals no signs of aneurysm, pain is reproduced with palpation in the lumbar lateral left spine as well as the left buttock mid buttock and lateral buttock. No pain over the hip bursa, no pain with range of motion of the hip, slight crepitance of any consistent with patient's arthritis. The patient likely has radiculopathy type pain, can be followed up in the office with her orthopedic group, she will be given pain medication and despite muscle relaxer to help with this.  EMERGENCY DEPARTMENT ULTRASOUND  Study: Limited Retroperitoneal Ultrasound of the Abdominal Aorta.  INDICATIONS:Back pain and Age>55 Multiple views of the abdominal aorta were obtained in real-time from the diaphragmatic hiatus to the aortic bifurcation in transverse planes with a multi-frequency probe. PERFORMED BY: Myself IMAGES ARCHIVED?: Yes FINDINGS: Maximum aortic dimensions are 2.7 LIMITATIONS:  Body habitus and Bowel gas INTERPRETATION:  No abdominal aortic aneurysm  Medical screening examination/treatment/procedure(s) were conducted as a shared visit with non-physician practitioner(s) and myself.  I personally evaluated the patient during the encounter.  Clinical Impression:   Final diagnoses:  Left-sided low back pain with left-sided sciatica             Johnna Acosta, MD 03/05/14 1308

## 2014-03-05 NOTE — ED Provider Notes (Signed)
Medical screening examination/treatment/procedure(s) were conducted as a shared visit with non-physician practitioner(s) and myself.  I personally evaluated the patient during the encounter  Please see my separate respective documentation pertaining to this patient encounter   Johnna Acosta, MD 03/05/14 1308

## 2014-04-27 ENCOUNTER — Telehealth: Payer: Self-pay | Admitting: Interventional Cardiology

## 2014-04-27 NOTE — Telephone Encounter (Signed)
Returned pt call.adv her that Dr.Smith does not have any availability until mid January. Adv her that she should go ahead and schedule an appt with an extender and we can schedule her next f/u with Dr.Smith. Adv her that a scheduler form our office will call to  Schedule.pt agreeable and verbalized understanding.

## 2014-04-27 NOTE — Telephone Encounter (Signed)
Follow Up  Pt daughter called to schedule with Dr. Tamala Julian Declined PA/ Please assist

## 2014-04-27 NOTE — Telephone Encounter (Signed)
New problem   Pt was seen by Dr Berdine Addison and was advise to see her cardiologist asap per pt's daughter Rodney Cruise. Pt has low heartbeat. Please advise pt.

## 2014-04-28 NOTE — Telephone Encounter (Signed)
Pt aware of appt with Margaret Pyle 05/01/14 @ 2:40PM. Pt provided office address with verbal understanding.

## 2014-04-28 NOTE — Telephone Encounter (Signed)
F/u ° ° ° °Pt calling about previous message. Please call pt °

## 2014-05-01 ENCOUNTER — Encounter: Payer: Self-pay | Admitting: Physician Assistant

## 2014-05-01 ENCOUNTER — Ambulatory Visit (INDEPENDENT_AMBULATORY_CARE_PROVIDER_SITE_OTHER): Payer: Medicare Other | Admitting: Physician Assistant

## 2014-05-01 VITALS — BP 99/50 | HR 51 | Ht 64.0 in | Wt 188.0 lb

## 2014-05-01 DIAGNOSIS — R001 Bradycardia, unspecified: Secondary | ICD-10-CM

## 2014-05-01 DIAGNOSIS — I10 Essential (primary) hypertension: Secondary | ICD-10-CM

## 2014-05-01 DIAGNOSIS — I5032 Chronic diastolic (congestive) heart failure: Secondary | ICD-10-CM

## 2014-05-01 DIAGNOSIS — E785 Hyperlipidemia, unspecified: Secondary | ICD-10-CM

## 2014-05-01 MED ORDER — AMLODIPINE BESYLATE 5 MG PO TABS: 5.0000 mg | ORAL_TABLET | Freq: Every day | ORAL | Status: DC

## 2014-05-01 NOTE — Progress Notes (Signed)
Cardiology Office Note   Date:  05/01/2014   ID:  [name], DOB April 15, 1924, MRN [id number]  PCP:  Deanna Font, MD  Cardiologist:  Dr. Daneen Schick     History of Present Illness: [name] is a 80 y.o. female with a history of HTN, diabetes, HL, diastolic CHF, prior stroke, spinal stenosis.   Last seen by Dr. Tamala [name] 01/2013.   Patient was recently seen by primary care and noted to be bradycardic. She was referred back for further evaluation. The patient denies syncope. She does have occasional dizziness. She has one episode where she felt near syncopal. She does note dyspnea with exertion. She is NYHA 3. She sleeps on 2 pillows chronically without significant change. She denies PND. LE edema is overall stable. She has noted increased weight over the past several months. She denies chest discomfort.  Studies:   - Carotid US (6/05):  Bilateral plaque without hemodynamically significant stenosis  - Abdominal US (8/11): No AAA  - Nuclear (05/2005): No ischemia, normal study, EF 75%  - Echo (09/2012): Mild LVH, EF 65-70%, moderately elevated RVSP, moderate TR, mild to moderate MR, mild LAE, grade 2 diastolic dysfunction   Recent Labs: No results found for requested labs within last 365 days.    Recent Radiology: No results found.    Wt Readings from Last 3 Encounters:  05/01/14 188 lb (85.276 kg)  06/02/12 187 lb (84.823 kg)  05/28/12 187 lb 4 oz (84.936 kg)     Past Medical History  Diagnosis Date  . Hypertension   . GERD (gastroesophageal reflux disease)   . Arthritis   . Slipped intervertebral disc   . Bilateral carpal tunnel syndrome   . Glaucoma   . Cataracts, both eyes   . 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 Diabetes mellitus ORAL MED  . Gout   . Chronic diastolic CHF (congestive heart failure)     a. Echo (09/2012): Mild LVH, EF 65-70%, moderately elevated RVSP, moderate TR, mild to moderate MR, mild LAE, grade 2 diastolic  dysfunction  . Mitral regurgitation   . History of falling RECENT FALL 1 WK AGO--   NO INJURY  . Right leg weakness   . Walker as ambulation aid   . Diabetic neuropathy both hands and legs  . Hyperlipidemia   . Rotator cuff tear     left  . PUD (peptic ulcer disease)     many yrs ago  . History of transfusion     Past Surgical History  Procedure Laterality Date  . Carpal tunnel release  09/17/2011    Procedure: CARPAL TUNNEL RELEASE;  Surgeon: Magnus Sinning, MD;  Location: [facility];  Service: Orthopedics;  Laterality: Right;  . Abdominal hysterectomy  1966  . Transthoracic echocardiogram  12-26-2008  DR Daneen Schick    NORMAL LVF/  EF  71%/   MILD ASYMMETRIC SEPTAL HYPERTROPHY/ MILD LEFT ATRIAL ENLARGEMENT/ MODERATELY ELEVATED ESTIMATED RIGHT VENTRICULAR SYSTOLIC PRESSURE/ MILD MITRAL  &  TRICUSPID VALVE REGURG.  . Cardiovascular stress test  06-18-2005   DR Daneen Schick    NORMAL STUDY/ NO EVIDENCE ISCHEMIA/ EF 75%  . Carpal tunnel release  10/22/2011    Procedure: CARPAL TUNNEL RELEASE;  Surgeon: Magnus Sinning, MD;  Location: [facility];  Service: Orthopedics;  Laterality: Left;  . Lumbar laminectomy/decompression microdiscectomy  01/14/2012    Procedure: LUMBAR LAMINECTOMY/DECOMPRESSION MICRODISCECTOMY 3 LEVELS;  Surgeon: Magnus Sinning, MD;  Location: [facility];  Service: Orthopedics;  Laterality: N/A;  Decompression Laminectomy of L2 - L3, L3 - L4 and L4 - L5 Central  (X-Ray)  . Appendectomy    . Shoulder open rotator cuff repair  06/02/2012    Procedure: ROTATOR CUFF REPAIR SHOULDER OPEN;  Surgeon: Magnus Sinning, MD;  Location: [facility];  Service: Orthopedics;  Laterality: Left;  Left Shoulder Open Distal Clavicle Resection Anterior Acrominectomy and Rotator Cuff Repair    Current Outpatient Prescriptions  Medication Sig Dispense Refill  . acetaminophen (TYLENOL) 500 MG tablet Take 1,500 mg by mouth every 6 (six) hours as needed for mild  pain.    Marland Kitchen allopurinol (ZYLOPRIM) 300 MG tablet Take 300 mg by mouth daily with breakfast.     . amLODipine (NORVASC) 10 MG tablet Take 10 mg by mouth every morning.     Marland Kitchen aspirin EC 81 MG tablet Take 81 mg by mouth daily.    Marland Kitchen b complex vitamins tablet Take 1 tablet by mouth daily.    . furosemide (LASIX) 40 MG tablet Take 40 mg by mouth daily.    Marland Kitchen glipiZIDE (GLUCOTROL) 5 MG tablet Take 5 mg by mouth daily.    . hydrALAZINE (APRESOLINE) 10 MG tablet Take 10 mg by mouth daily.     Marland Kitchen HYDROcodone-acetaminophen (NORCO/VICODIN) 5-325 MG per tablet Take 1-2 tablets by mouth every 6 (six) hours as needed for severe pain. 15 tablet 0  . lisinopril (PRINIVIL,ZESTRIL) 40 MG tablet Take 40 mg by mouth every morning.     . meloxicam (MOBIC) 15 MG tablet Take 15 mg by mouth daily.    . meloxicam (MOBIC) 7.5 MG tablet Take 2 tablets (15 mg total) by mouth daily. 30 tablet 0  . methocarbamol (ROBAXIN) 500 MG tablet Take 1 tablet (500 mg total) by mouth 2 (two) times daily as needed for muscle spasms. 14 tablet 0  . metoprolol tartrate (LOPRESSOR) 25 MG tablet Take 12.5 mg by mouth daily.    . pravastatin (PRAVACHOL) 20 MG tablet Take 20 mg by mouth every evening.    . probenecid (BENEMID) 500 MG tablet Take 500 mg by mouth daily.     . ranitidine (ZANTAC) 150 MG tablet Take 150 mg by mouth daily.     . timolol (BETIMOL) 0.25 % ophthalmic solution Place 1 drop into the right eye 2 (two) times daily.      No current facility-administered medications for this visit.     Allergies:   Review of patient's allergies indicates no known allergies.   Social History:  The patient  reports that she quit smoking about 45 years ago. Her smoking use included Cigarettes. She has a 30 pack-year smoking history. She has never used smokeless tobacco. She reports that she does not drink alcohol or use illicit drugs.   Family History:  The patient's family history includes Diabetes in her sister; Hypertension in her  father, mother, and sister.    ROS:  Please see the history of present illness.   She has occasional hemorrhoidal bleeding.   All other systems reviewed and negative.    PHYSICAL EXAM: VS:  BP 99/50 mmHg  Pulse 51  Ht 5\' 4"  (1.626 m)  Wt 188 lb (85.276 kg)  BMI 32.25 kg/m2 Well nourished, well developed, in no acute distress HEENT: normal Neck: Minimally elevated JVD Cardiac:  normal S1, S2;  RRR; no murmur   Lungs:   clear to auscultation bilaterally, no  wheezing, rhonchi or rales Abd: soft, nontender, no hepatomegaly Ext: Trace-1+ bilateral LE edema Skin: warm and dry Neuro:  CNs 2-12 intact, no focal abnormalities noted  EKG:  Sinus bradycardia, HR 51, normal axis, nonspecific ST-T wave changes, no change from prior tracing      ASSESSMENT AND PLAN:  1.  Bradycardia: The patient has a heart rate of 51 on ECG today. She is likely somewhat symptomatic from this.     -  Given her low blood pressure, I have recommended that she DC metoprolol.     -  If she continues to have slow heart rates off of beta blocker therapy, consider Holter monitor. 2.  Chronic diastolic CHF (congestive heart failure): She does have increased dyspnea and is NYHA 3. She has minimally elevated JVD on exam.     -  I will increase her Lasix to 60 mg daily for 3 days. Then resume 40 mg daily     -  Check BMET, BNP today.    -  If BNP significantly elevated, consider follow-up echocardiogram and a higher daily dose of Lasix.  3.  Essential hypertension:  The patient's blood pressure is running low. I suspect some of her symptoms may be related to hypotension.    -  DC metoprolol as outlined above.    -  Decrease amlodipine to 5 mg daily. This may help some of her edema as well as her heart rate (dihydropyridine calcium channel blockers sometimes have some AV nodal blocking properties).   4.  Hyperlipidemia: Continue statin.   Disposition:   FU with Dr. Daneen Schick or me in 3 weeks.    Signed, Versie Starks, MHS 05/01/2014 2:21 PM    [facility] [place] [place], [place], [place]  [address] Phone: [phone]; Fax: [phone]

## 2014-05-01 NOTE — Patient Instructions (Signed)
Your physician recommends that you schedule a follow-up appointment in:  3 weeks with Dr. Tamala Julian or with [name], PA on day Dr. Tamala Julian is here.  Your physician has recommended you make the following change in your medication:   Stop metoprolol. Decrease amlodipine to 5 mg by mouth daily. Take furosemide 60 mg by mouth daily for 3 days then resume at 40 mg daily.  Lab work to be done today--BMP,BNP

## 2014-05-02 ENCOUNTER — Telehealth: Payer: Self-pay | Admitting: *Deleted

## 2014-05-02 DIAGNOSIS — I5032 Chronic diastolic (congestive) heart failure: Secondary | ICD-10-CM

## 2014-05-02 DIAGNOSIS — I10 Essential (primary) hypertension: Secondary | ICD-10-CM

## 2014-05-02 LAB — BASIC METABOLIC PANEL
BUN: 35 mg/dL — AB (ref 6–23)
CALCIUM: 9.7 mg/dL (ref 8.4–10.5)
CO2: 23 mEq/L (ref 19–32)
CREATININE: 1.7 mg/dL — AB (ref 0.4–1.2)
Chloride: 109 mEq/L (ref 96–112)
GFR: 35.49 mL/min — ABNORMAL LOW (ref >60.00)
Glucose, Bld: 69 mg/dL — ABNORMAL LOW (ref 70–99)
Potassium: 4.4 mEq/L (ref 3.5–5.1)
Sodium: 141 mEq/L (ref 135–145)

## 2014-05-02 LAB — BRAIN NATRIURETIC PEPTIDE: PRO B NATRI PEPTIDE: 109 pg/mL — AB (ref 0.0–100.0)

## 2014-05-02 NOTE — Telephone Encounter (Signed)
s/w pt and her daughter Remo Lipps about lab results and med changes with repeat bmet 12/16. Daughter verbalized understanding to me with read back x 2 . Both aware to monitor wt daily and call if increased wt > 3 lb's x 1 day or more sob or edema.

## 2014-05-08 ENCOUNTER — Encounter: Payer: Self-pay | Admitting: Interventional Cardiology

## 2014-05-08 ENCOUNTER — Encounter: Payer: Self-pay | Admitting: Physician Assistant

## 2014-05-09 ENCOUNTER — Ambulatory Visit: Payer: Medicare Other | Admitting: Physician Assistant

## 2014-05-10 ENCOUNTER — Other Ambulatory Visit (INDEPENDENT_AMBULATORY_CARE_PROVIDER_SITE_OTHER): Payer: Medicare Other | Admitting: *Deleted

## 2014-05-10 ENCOUNTER — Telehealth: Payer: Self-pay | Admitting: *Deleted

## 2014-05-10 DIAGNOSIS — I10 Essential (primary) hypertension: Secondary | ICD-10-CM

## 2014-05-10 DIAGNOSIS — I5032 Chronic diastolic (congestive) heart failure: Secondary | ICD-10-CM

## 2014-05-10 LAB — BASIC METABOLIC PANEL
BUN: 30 mg/dL — ABNORMAL HIGH (ref 6–23)
CO2: 23 meq/L (ref 19–32)
Calcium: 9.5 mg/dL (ref 8.4–10.5)
Chloride: 111 mEq/L (ref 96–112)
Creatinine, Ser: 1.3 mg/dL — ABNORMAL HIGH (ref 0.4–1.2)
GFR: 51.98 mL/min — ABNORMAL LOW (ref >60.00)
GLUCOSE: 106 mg/dL — AB (ref 70–99)
POTASSIUM: 4.5 meq/L (ref 3.5–5.1)
Sodium: 139 mEq/L (ref 135–145)

## 2014-05-10 NOTE — Telephone Encounter (Signed)
pt notified about lab results. Pt will get bmet 1/5 when she sees [place], [place].

## 2014-05-30 ENCOUNTER — Ambulatory Visit: Payer: Medicare Other | Admitting: Physician Assistant

## 2014-05-30 ENCOUNTER — Other Ambulatory Visit: Payer: Medicare Other

## 2014-06-19 ENCOUNTER — Ambulatory Visit: Payer: Medicare Other | Admitting: Physician Assistant

## 2014-06-19 ENCOUNTER — Other Ambulatory Visit: Payer: Medicare Other

## 2014-06-27 ENCOUNTER — Emergency Department (HOSPITAL_COMMUNITY): Payer: Medicare Other

## 2014-06-27 ENCOUNTER — Emergency Department (HOSPITAL_COMMUNITY)
Admission: EM | Admit: 2014-06-27 | Discharge: 2014-06-27 | Disposition: A | Discharge status: Home / Self Care | Payer: Medicare Other | Attending: Emergency Medicine | Admitting: Emergency Medicine

## 2014-06-27 ENCOUNTER — Encounter (HOSPITAL_COMMUNITY): Payer: Self-pay

## 2014-06-27 DIAGNOSIS — Z9181 History of falling: Secondary | ICD-10-CM | POA: Insufficient documentation

## 2014-06-27 DIAGNOSIS — M25561 Pain in right knee: Secondary | ICD-10-CM | POA: Insufficient documentation

## 2014-06-27 DIAGNOSIS — Z8711 Personal history of peptic ulcer disease: Secondary | ICD-10-CM | POA: Insufficient documentation

## 2014-06-27 DIAGNOSIS — Z7982 Long term (current) use of aspirin: Secondary | ICD-10-CM | POA: Insufficient documentation

## 2014-06-27 DIAGNOSIS — M25551 Pain in right hip: Secondary | ICD-10-CM | POA: Diagnosis not present

## 2014-06-27 DIAGNOSIS — I10 Essential (primary) hypertension: Secondary | ICD-10-CM | POA: Insufficient documentation

## 2014-06-27 DIAGNOSIS — M160 Bilateral primary osteoarthritis of hip: Secondary | ICD-10-CM | POA: Insufficient documentation

## 2014-06-27 DIAGNOSIS — E785 Hyperlipidemia, unspecified: Secondary | ICD-10-CM | POA: Insufficient documentation

## 2014-06-27 DIAGNOSIS — Z8673 Personal history of transient ischemic attack (TIA), and cerebral infarction without residual deficits: Secondary | ICD-10-CM | POA: Insufficient documentation

## 2014-06-27 DIAGNOSIS — H409 Unspecified glaucoma: Secondary | ICD-10-CM | POA: Insufficient documentation

## 2014-06-27 DIAGNOSIS — K219 Gastro-esophageal reflux disease without esophagitis: Secondary | ICD-10-CM | POA: Diagnosis not present

## 2014-06-27 DIAGNOSIS — Z87828 Personal history of other (healed) physical injury and trauma: Secondary | ICD-10-CM | POA: Diagnosis not present

## 2014-06-27 DIAGNOSIS — M1611 Unilateral primary osteoarthritis, right hip: Secondary | ICD-10-CM | POA: Diagnosis not present

## 2014-06-27 DIAGNOSIS — E1140 Type 2 diabetes mellitus with diabetic neuropathy, unspecified: Secondary | ICD-10-CM | POA: Diagnosis not present

## 2014-06-27 DIAGNOSIS — I5032 Chronic diastolic (congestive) heart failure: Secondary | ICD-10-CM | POA: Diagnosis not present

## 2014-06-27 DIAGNOSIS — Z79899 Other long term (current) drug therapy: Secondary | ICD-10-CM | POA: Insufficient documentation

## 2014-06-27 DIAGNOSIS — M1990 Unspecified osteoarthritis, unspecified site: Secondary | ICD-10-CM | POA: Diagnosis not present

## 2014-06-27 DIAGNOSIS — Z791 Long term (current) use of non-steroidal anti-inflammatories (NSAID): Secondary | ICD-10-CM | POA: Diagnosis not present

## 2014-06-27 DIAGNOSIS — Z87891 Personal history of nicotine dependence: Secondary | ICD-10-CM | POA: Insufficient documentation

## 2014-06-27 DIAGNOSIS — H269 Unspecified cataract: Secondary | ICD-10-CM | POA: Insufficient documentation

## 2014-06-27 DIAGNOSIS — M1711 Unilateral primary osteoarthritis, right knee: Secondary | ICD-10-CM | POA: Diagnosis not present

## 2014-06-27 DIAGNOSIS — M109 Gout, unspecified: Secondary | ICD-10-CM | POA: Diagnosis not present

## 2014-06-27 LAB — CBG MONITORING, ED: GLUCOSE-CAPILLARY: 103 mg/dL — AB (ref 70–99)

## 2014-06-27 MED ORDER — OXYCODONE-ACETAMINOPHEN 5-325 MG PO TABS: 1.0000 | ORAL_TABLET | Freq: Three times a day (TID) | ORAL | Status: DC | PRN

## 2014-06-27 MED ORDER — OXYCODONE-ACETAMINOPHEN 5-325 MG PO TABS
1.0000 | ORAL_TABLET | Freq: Once | ORAL | Status: AC
  Administered 2014-06-27: 1 via ORAL
  Filled 2014-06-27: qty 1

## 2014-06-27 NOTE — ED Notes (Addendum)
Pt. Denies  Rt. Leg pain at rest.  She reports having rt. Leg pan with ambulation with her walker.  She reports that she has not been able to walk for 2 weeks even with her walker

## 2014-06-27 NOTE — ED Notes (Signed)
RN notified [facility]

## 2014-06-27 NOTE — ED Provider Notes (Signed)
CSN: [id number]     Arrival date & time 06/27/14  0820 History   First MD Initiated Contact with Patient 06/27/14 [phone]     Chief Complaint  Patient presents with  . Leg Pain     (Consider location/radiation/quality/duration/timing/severity/associated sxs/prior Treatment) The history is provided by the patient. No language interpreter was used.  [name] is an 81 y/o F with PMHx of HTN, GERD, arthritis, intervertebral disc with back surgery a year and a half ago, glaucoma, stroke, DM, CHF presenting to the ED with right hip pain and right leg pain that has been ongoing for the past 6-7 months. Patient reported that she has been having issues with her right leg in the past where it would give out on her and she would fall. Patient reported that over the past week she's been having right hip pain described as an aching sensation with resting in a sharp shooting pain with motion. Reported that she has been unable to bear any weight to the right hip secondary to pain. Patient reports that when she stands up and applies pressure to her right hip the pain shoots down her entire right leg. Reported that she has not been using much for discomfort. Denied fall, injury, back pain, urinary and bowel incontinence, loss of sensation, abdominal pain, chest pain, shortness of breath, difficulty breathing. Patient currently lives with her daughter at home. Patient uses walker to get around. PCP Dr. Berdine Addison   Past Medical History  Diagnosis Date  . Hypertension   . GERD (gastroesophageal reflux disease)   . Arthritis   . Slipped intervertebral disc   . Bilateral carpal tunnel syndrome   . Glaucoma   . Cataracts, both eyes   . 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 Diabetes mellitus ORAL MED  . Gout   . Chronic diastolic CHF (congestive heart failure)     a. Echo (09/2012): Mild LVH, EF 65-70%, moderately elevated RVSP, moderate TR, mild to moderate MR, mild LAE, grade 2 diastolic  dysfunction  . Mitral regurgitation   . History of falling RECENT FALL 1 WK AGO--   NO INJURY  . Right leg weakness   . Walker as ambulation aid   . Diabetic neuropathy both hands and legs  . Hyperlipidemia   . Rotator cuff tear     left  . PUD (peptic ulcer disease)     many yrs ago  . History of transfusion    Past Surgical History  Procedure Laterality Date  . Carpal tunnel release  09/17/2011    Procedure: CARPAL TUNNEL RELEASE;  Surgeon: Magnus Sinning, MD;  Location: [facility];  Service: Orthopedics;  Laterality: Right;  . Abdominal hysterectomy  1966  . Transthoracic echocardiogram  12-26-2008  DR Daneen Schick    NORMAL LVF/  EF  71%/   MILD ASYMMETRIC SEPTAL HYPERTROPHY/ MILD LEFT ATRIAL ENLARGEMENT/ MODERATELY ELEVATED ESTIMATED RIGHT VENTRICULAR SYSTOLIC PRESSURE/ MILD MITRAL  &  TRICUSPID VALVE REGURG.  . Cardiovascular stress test  06-18-2005   DR Daneen Schick    NORMAL STUDY/ NO EVIDENCE ISCHEMIA/ EF 75%  . Carpal tunnel release  10/22/2011    Procedure: CARPAL TUNNEL RELEASE;  Surgeon: Magnus Sinning, MD;  Location: [facility];  Service: Orthopedics;  Laterality: Left;  . Lumbar laminectomy/decompression microdiscectomy  01/14/2012    Procedure: LUMBAR LAMINECTOMY/DECOMPRESSION MICRODISCECTOMY 3 LEVELS;  Surgeon: Magnus Sinning, MD;  Location: [facility];  Service: Orthopedics;  Laterality: N/A;  Decompression Laminectomy of L2 - L3, L3 - L4 and L4 - L5 Central  (X-Ray)  . Appendectomy    . Shoulder open rotator cuff repair  06/02/2012    Procedure: ROTATOR CUFF REPAIR SHOULDER OPEN;  Surgeon: Magnus Sinning, MD;  Location: [facility];  Service: Orthopedics;  Laterality: Left;  Left Shoulder Open Distal Clavicle Resection Anterior Acrominectomy and Rotator Cuff Repair   Family History  Problem Relation Age of Onset  . Hypertension Father   . Hypertension Mother   . Diabetes Sister   . Hypertension Sister   . Heart attack Son   . Stroke  Mother    History  Substance Use Topics  . Smoking status: Former Smoker -- 1.00 packs/day for 30 years    Types: Cigarettes    Quit date: 05/28/1968  . Smokeless tobacco: Never Used  . Alcohol Use: No   OB History    No data available     Review of Systems  Constitutional: Negative for fever and chills.  Respiratory: Negative for chest tightness and shortness of breath.   Cardiovascular: Negative for chest pain.  Gastrointestinal: Negative for abdominal pain.  Musculoskeletal: Positive for arthralgias (right hip ). Negative for back pain, neck pain and neck stiffness.  Neurological: Negative for weakness and numbness.      Allergies  Review of patient's allergies indicates no known allergies.  Home Medications   Prior to Admission medications   Medication Sig Start Date End Date Taking? Authorizing Provider  acetaminophen (TYLENOL) 500 MG tablet Take 1,500 mg by mouth every 6 (six) hours as needed for mild pain.   Yes Historical Provider, MD  allopurinol (ZYLOPRIM) 300 MG tablet Take 300 mg by mouth daily with breakfast.    Yes Historical Provider, MD  amLODipine (NORVASC) 5 MG tablet Take 1 tablet (5 mg total) by mouth daily. 05/01/14  Yes Liliane Shi, PA-C  aspirin EC 81 MG tablet Take 81 mg by mouth daily.   Yes Historical Provider, MD  dorzolamide-timolol (COSOPT) 22.3-6.8 MG/ML ophthalmic solution Place 1 drop into the right eye 2 (two) times daily.   Yes Historical Provider, MD  furosemide (LASIX) 40 MG tablet Take 40 mg by mouth daily.    Yes Historical Provider, MD  glipiZIDE (GLUCOTROL) 5 MG tablet Take 10 mg by mouth daily.    Yes Historical Provider, MD  hydrALAZINE (APRESOLINE) 10 MG tablet Take 10 mg by mouth 2 (two) times daily.    Yes Historical Provider, MD  lisinopril (PRINIVIL,ZESTRIL) 40 MG tablet Take 40 mg by mouth every morning.    Yes Historical Provider, MD  meloxicam (MOBIC) 15 MG tablet Take 15 mg by mouth daily.   Yes Historical Provider, MD    pravastatin (PRAVACHOL) 20 MG tablet Take 20 mg by mouth every evening.   Yes Historical Provider, MD  ranitidine (ZANTAC) 150 MG tablet Take 150 mg by mouth 2 (two) times daily.    Yes Historical Provider, MD  timolol (BETIMOL) 0.5 % ophthalmic solution Place 1 drop into the left eye daily.   Yes Historical Provider, MD  oxyCODONE-acetaminophen (PERCOCET/ROXICET) 5-325 MG per tablet Take 1 tablet by mouth every 8 (eight) hours as needed for moderate pain or severe pain. 06/27/14   Dayne Chait, PA-C   BP 157/69 mmHg  Pulse 59  Temp(Src) 98.2 F (36.8 C)  Resp 17  SpO2 99% Physical Exam  Constitutional: She is oriented to person, place, and time.  She appears well-developed and well-nourished. No distress.  HENT:  Head: Normocephalic and atraumatic.  Eyes: Conjunctivae and EOM are normal. Right eye exhibits no discharge. Left eye exhibits no discharge.  Neck: Normal range of motion. Neck supple. No tracheal deviation present.  Cardiovascular: Normal rate, regular rhythm and normal heart sounds.  Exam reveals no friction rub.   No murmur heard. Pulses:      Radial pulses are 2+ on the right side, and 2+ on the left side.       Dorsalis pedis pulses are 2+ on the right side, and 2+ on the left side.  Cap refill < 3 seconds Negative swelling or pitting edema noted to the lower extremities bilaterally   Pulmonary/Chest: Effort normal and breath sounds normal. No respiratory distress. She has no wheezes. She has no rales.  Musculoskeletal: Normal range of motion. She exhibits tenderness. She exhibits no edema.       Right knee: She exhibits normal range of motion, no swelling, no effusion, no ecchymosis, no deformity and no laceration. Tenderness found.       Legs: Negative swelling, erythema, inflammation, lesions, sores, deformities, malalignment noted to the RLE. Negative pain upon palpation to the pelvic girdle or right hip. Mild discomfort upon palpation to the right knee - anterior  aspect. Full ROM to the RLE without difficulty or ataxia. Full flexion and extension of the right knee. Full flexion, extension, abduction, adduction of the right hip without difficulty.   Lymphadenopathy:    She has no cervical adenopathy.  Neurological: She is alert and oriented to person, place, and time. No cranial nerve deficit. She exhibits normal muscle tone. Coordination normal.  Cranial nerves III-XII grossly intact Strength 5+/5+ to upper and lower extremities bilaterally with resistance applied, equal distribution noted Negative saddle paresthesias bilaterally  Sensation intact with differentiation to sharp and dull touch   Skin: Skin is warm and dry. No rash noted. She is not diaphoretic. No erythema.  Psychiatric: She has a normal mood and affect. Her behavior is normal. Thought content normal.  Nursing note and vitals reviewed.   ED Course  Procedures (including critical care time) Labs Review Labs Reviewed  CBG MONITORING, ED - Abnormal; Notable for the following:    Glucose-Capillary 103 (*)    All other components within normal limits    Imaging Review Dg Knee Complete 4 Views Right  06/27/2014   CLINICAL DATA:  One week history of pain and difficulty weight-bearing  EXAM: RIGHT KNEE - COMPLETE 4+ VIEW  COMPARISON:  None.  FINDINGS: Frontal, lateral, and bilateral oblique views were obtained. There is no fracture or dislocation. No appreciable joint effusion. There is generalized osteoarthritic change with narrowing in all compartments as well as spurring in all compartments, most pronounced laterally and in the patellofemoral joint. No erosive change. There is calcification in the popliteal artery.  IMPRESSION: Extensive osteoarthritic change. No fracture or dislocation. Atherosclerotic calcification.   Electronically Signed   By: Lowella Grip M.D.   On: 06/27/2014 10:23   Dg Hip Unilat With Pelvis 2-3 Views Right  06/27/2014   CLINICAL DATA:  Right hip pain with  inability to bear weight for 1 week. No known injury.  EXAM: RIGHT HIP (WITH PELVIS) 2-3 VIEWS  COMPARISON:  03/04/2014  FINDINGS: There is no evidence of hip fracture or dislocation. No focal bone lesion, focal osteopenia, or evidence of necrosis. There is unchanged moderate degenerative narrowing of both hips. Bilateral sacroiliac osteoarthritis with sclerosis and subchondral irregularity.  Asymmetric appearance of the right first and second sacral foramina is likely projectional and unchanged based on previous imaging. The pelvic ring fracture is unlikely.  IMPRESSION: 1. No acute osseous findings. 2. Moderate bilateral hip and sacroiliac arthritis.   Electronically Signed   By: Jorje Guild M.D.   On: 06/27/2014 10:26     EKG Interpretation None      12:11 PM Patient reported that she is feeling better, reported that the pain medications helped.   1:12 PM Attending physician, Dr. Marylene Buerger at bedside assessing patient. Agreed to plan of discharge.   MDM   Final diagnoses:  Right hip pain  Arthritis    Medications  oxyCODONE-acetaminophen (PERCOCET/ROXICET) 5-325 MG per tablet 1 tablet (1 tablet Oral Given 06/27/14 1116)    Filed Vitals:   06/27/14 1130 06/27/14 1150 06/27/14 1200 06/27/14 1313  BP: 151/68 151/68 149/70 157/69  Pulse: 62 63 62 59  Temp:      Resp: 19 19 17 17   SpO2: 98% 99% 98% 99%    CBG 103. Unilateral hip with pelvis negative for acute osseous findings-moderate bilateral hip and sacroiliac arthritis noted. Plain film of right knee noted extensive osteophytic changes with no fracture dislocation-atherosclerotic calcification noted. Doubt septic joint. Doubt compartment syndrome. Suspicion to be arthritis. Patient given pain medications in the ED setting with relief. Patient ambulated well without difficulty using the walker - patient reported that the pain has been relieved with the pain medications. Negative focal neurological deficits noted. Pulses palpable  and strong. Full range of motion to lower extremities noted-full flexion extension identified to the right hip without difficulty. Sensation intact. Patient seen and assessed by attending physician, Dr. Marylene Buerger - agrees to plan of discharge. Patient stable, afebrile. Patient not septic appearing. Discharged patient. Discharged patient with a small dose of pain medications-discussed course, precautions, disposal technique. Referred patient to PCP and orthopedics. Discussed with patient to rest and avoid any physical strenuous activity. Discussed with patient to closely monitor symptoms and if symptoms are to worsen or change to report back to the ED - strict return instructions given.  Patient agreed to plan of care, understood, all questions answered.   Jamse Mead, PA-C 06/27/14 [address] PA-C 06/27/14 [place], MD 06/27/14 1329

## 2014-06-27 NOTE — ED Notes (Signed)
Pt. Reports having rt. Hip pain that began approximately weeks ago.  Today she is unable to put weight on her rt. Leg.  She denies any fall, no visible marks noted.  Pain decreases at rest and increases with movement.   She has a diagnosis of arthritis

## 2014-06-27 NOTE — ED Notes (Addendum)
Pt ambulated in room (for safety) with assistance of walker to the bathroom. Pt sts pain improvement while walking. Pt returned to bed and sts she is "much better".

## 2014-06-27 NOTE — Discharge Instructions (Signed)
Please call your doctor for a followup appointment within 24-48 hours. When you talk to your doctor please let them know that you were seen in the emergency department and have them acquire all of your records so that they can discuss the findings with you and formulate a treatment plan to fully care for your new and ongoing problems. Please follow up with your primary care provider Please follow up with orthopedics Please take medications as prescribed - while on pain medications there is to be no drinking alcohol, driving, operating any heavy machinery. If extra please dispose in a proper manner. Please do not take any extra Tylenol with this medication for this can lead to Tylenol overdose and liver issues.  Please continue to monitor symptoms closely and if symptoms are to worsen or change (fever greater than 101, chills, sweating, nausea, vomiting, chest pain, shortness of breathe, difficulty breathing, weakness, numbness, tingling, worsening or changes to pain pattern, fall, injury, loss of sensation, swelling to the leg, changes to skin color) please report back to the Emergency Department immediately.   Arthritis, Nonspecific Arthritis is inflammation of a joint. This usually means pain, redness, warmth or swelling are present. One or more joints may be involved. There are a number of types of arthritis. Your caregiver may not be able to tell what type of arthritis you have right away. CAUSES  The most common cause of arthritis is the wear and tear on the joint (osteoarthritis). This causes damage to the cartilage, which can break down over time. The knees, hips, back and neck are most often affected by this type of arthritis. Other types of arthritis and common causes of joint pain include:  Sprains and other injuries near the joint. Sometimes minor sprains and injuries cause pain and swelling that develop hours later.  Rheumatoid arthritis. This affects hands, feet and knees. It usually  affects both sides of your body at the same time. It is often associated with chronic ailments, fever, weight loss and general weakness.  Crystal arthritis. Gout and pseudo gout can cause occasional acute severe pain, redness and swelling in the foot, ankle, or knee.  Infectious arthritis. Bacteria can get into a joint through a break in overlying skin. This can cause infection of the joint. Bacteria and viruses can also spread through the blood and affect your joints.  Drug, infectious and allergy reactions. Sometimes joints can become mildly painful and slightly swollen with these types of illnesses. SYMPTOMS   Pain is the main symptom.  Your joint or joints can also be red, swollen and warm or hot to the touch.  You may have a fever with certain types of arthritis, or even feel overall ill.  The joint with arthritis will hurt with movement. Stiffness is present with some types of arthritis. DIAGNOSIS  Your caregiver will suspect arthritis based on your description of your symptoms and on your exam. Testing may be needed to find the type of arthritis:  Blood and sometimes urine tests.  X-ray tests and sometimes CT or MRI scans.  Removal of fluid from the joint (arthrocentesis) is done to check for bacteria, crystals or other causes. Your caregiver (or a specialist) will numb the area over the joint with a local anesthetic, and use a needle to remove joint fluid for examination. This procedure is only minimally uncomfortable.  Even with these tests, your caregiver may not be able to tell what kind of arthritis you have. Consultation with a specialist (rheumatologist) may be helpful.  TREATMENT  Your caregiver will discuss with you treatment specific to your type of arthritis. If the specific type cannot be determined, then the following general recommendations may apply. Treatment of severe joint pain includes:  Rest.  Elevation.  Anti-inflammatory medication (for example,  ibuprofen) may be prescribed. Avoiding activities that cause increased pain.  Only take over-the-counter or prescription medicines for pain and discomfort as recommended by your caregiver.  Cold packs over an inflamed joint may be used for 10 to 15 minutes every hour. Hot packs sometimes feel better, but do not use overnight. Do not use hot packs if you are diabetic without your caregiver's permission.  A cortisone shot into arthritic joints may help reduce pain and swelling.  Any acute arthritis that gets worse over the next 1 to 2 days needs to be looked at to be sure there is no joint infection. Long-term arthritis treatment involves modifying activities and lifestyle to reduce joint stress jarring. This can include weight loss. Also, exercise is needed to nourish the joint cartilage and remove waste. This helps keep the muscles around the joint strong. HOME CARE INSTRUCTIONS   Do not take aspirin to relieve pain if gout is suspected. This elevates uric acid levels.  Only take over-the-counter or prescription medicines for pain, discomfort or fever as directed by your caregiver.  Rest the joint as much as possible.  If your joint is swollen, keep it elevated.  Use crutches if the painful joint is in your leg.  Drinking plenty of fluids may help for certain types of arthritis.  Follow your caregiver's dietary instructions.  Try low-impact exercise such as:  Swimming.  Water aerobics.  Biking.  Walking.  Morning stiffness is often relieved by a warm shower.  Put your joints through regular range-of-motion. SEEK MEDICAL CARE IF:   You do not feel better in 24 hours or are getting worse.  You have side effects to medications, or are not getting better with treatment. SEEK IMMEDIATE MEDICAL CARE IF:   You have a fever.  You develop severe joint pain, swelling or redness.  Many joints are involved and become painful and swollen.  There is severe back pain and/or leg  weakness.  You have loss of bowel or bladder control. Document Released: 06/19/2004 Document Revised: 08/04/2011 Document Reviewed: 07/05/2008 [facility] Patient Information 2015 [place], [place]. This information is not intended to replace advice given to you by your health care provider. Make sure you discuss any questions you have with your health care provider.

## 2014-06-29 ENCOUNTER — Encounter: Payer: Self-pay | Admitting: Physician Assistant

## 2014-06-29 ENCOUNTER — Ambulatory Visit (INDEPENDENT_AMBULATORY_CARE_PROVIDER_SITE_OTHER): Payer: Medicare Other | Admitting: Physician Assistant

## 2014-06-29 ENCOUNTER — Other Ambulatory Visit (INDEPENDENT_AMBULATORY_CARE_PROVIDER_SITE_OTHER): Payer: Medicare Other | Admitting: *Deleted

## 2014-06-29 ENCOUNTER — Telehealth: Payer: Self-pay | Admitting: *Deleted

## 2014-06-29 VITALS — BP 127/58 | HR 59 | Ht 64.0 in | Wt 184.0 lb

## 2014-06-29 DIAGNOSIS — I5032 Chronic diastolic (congestive) heart failure: Secondary | ICD-10-CM

## 2014-06-29 DIAGNOSIS — I10 Essential (primary) hypertension: Secondary | ICD-10-CM

## 2014-06-29 DIAGNOSIS — R001 Bradycardia, unspecified: Secondary | ICD-10-CM | POA: Diagnosis not present

## 2014-06-29 DIAGNOSIS — E785 Hyperlipidemia, unspecified: Secondary | ICD-10-CM | POA: Diagnosis not present

## 2014-06-29 LAB — BASIC METABOLIC PANEL
BUN: 40 mg/dL — AB (ref 6–23)
CALCIUM: 9.8 mg/dL (ref 8.4–10.5)
CO2: 23 mEq/L (ref 19–32)
Chloride: 109 mEq/L (ref 96–112)
Creatinine, Ser: 1.95 mg/dL — ABNORMAL HIGH (ref 0.40–1.20)
GFR: 31.1 mL/min — ABNORMAL LOW (ref >60.00)
Glucose, Bld: 119 mg/dL — ABNORMAL HIGH (ref 70–99)
Potassium: 4 mEq/L (ref 3.5–5.1)
Sodium: 142 mEq/L (ref 135–145)

## 2014-06-29 NOTE — Telephone Encounter (Signed)
pt notified about lab results, hold lasix 2/5, resume 2/6 alternating lasix 40 mg every other day w/20 mg every other day, bmet 2/11. Pt verbalized understanding to plan of care

## 2014-06-29 NOTE — Progress Notes (Signed)
Cardiology Office Note   Date:  06/29/2014   ID:  [name], DOB Mar 25, 1924, MRN [id number]  PCP:  Maggie Font, MD  Cardiologist:  Dr. Daneen Schick     Chief Complaint  Patient presents with  . Bradycardia    follow up  . Congestive Heart Failure  . Hypertension     History of Present Illness: [name] is a 81 y.o. female with a hx of HTN, diabetes, HL, diastolic CHF, prior stroke, spinal stenosis.   I saw her 05/01/14 2/2 bradycardia.  Metoprolol was DC'd.  Lasix was adjusted for volume excess.  Amlodipine was reduced for low BP.  She returns for FU.  She denies any dizziness or syncope. She denies chest discomfort. She denies significant dyspnea. However, her mobility is limited by severe knee arthritis. She sleeps on 2 pillows chronically. She denies PND. She denies worsening LE edema.   Studies:  - Carotid US (6/05): Bilateral plaque without hemodynamically significant stenosis - Abdominal US (8/11): No AAA - Nuclear (05/2005): No ischemia, normal study, EF 75% - Echo (09/2012): Mild LVH, EF 65-70%, moderately elevated RVSP, moderate TR, mild to moderate MR, mild LAE, grade 2 diastolic dysfunction   Past Medical History  Diagnosis Date  . Hypertension   . GERD (gastroesophageal reflux disease)   . Arthritis   . Slipped intervertebral disc   . Bilateral carpal tunnel syndrome   . Glaucoma   . Cataracts, both eyes   . 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 Diabetes mellitus ORAL MED  . Gout   . Chronic diastolic CHF (congestive heart failure)     a. Echo (09/2012): Mild LVH, EF 65-70%, moderately elevated RVSP, moderate TR, mild to moderate MR, mild LAE, grade 2 diastolic dysfunction  . Mitral regurgitation   . History of falling RECENT FALL 1 WK AGO--   NO INJURY  . Right leg weakness   . Walker as ambulation aid   . Diabetic neuropathy both hands and legs  . Hyperlipidemia   . Rotator cuff tear     left  . PUD (peptic  ulcer disease)     many yrs ago  . History of transfusion     Past Surgical History  Procedure Laterality Date  . Carpal tunnel release  09/17/2011    Procedure: CARPAL TUNNEL RELEASE;  Surgeon: Magnus Sinning, MD;  Location: [facility];  Service: Orthopedics;  Laterality: Right;  . Abdominal hysterectomy  1966  . Transthoracic echocardiogram  12-26-2008  DR Daneen Schick    NORMAL LVF/  EF  71%/   MILD ASYMMETRIC SEPTAL HYPERTROPHY/ MILD LEFT ATRIAL ENLARGEMENT/ MODERATELY ELEVATED ESTIMATED RIGHT VENTRICULAR SYSTOLIC PRESSURE/ MILD MITRAL  &  TRICUSPID VALVE REGURG.  . Cardiovascular stress test  06-18-2005   DR Daneen Schick    NORMAL STUDY/ NO EVIDENCE ISCHEMIA/ EF 75%  . Carpal tunnel release  10/22/2011    Procedure: CARPAL TUNNEL RELEASE;  Surgeon: Magnus Sinning, MD;  Location: [facility];  Service: Orthopedics;  Laterality: Left;  . Lumbar laminectomy/decompression microdiscectomy  01/14/2012    Procedure: LUMBAR LAMINECTOMY/DECOMPRESSION MICRODISCECTOMY 3 LEVELS;  Surgeon: Magnus Sinning, MD;  Location: [facility];  Service: Orthopedics;  Laterality: N/A;  Decompression Laminectomy of L2 - L3, L3 - L4 and L4 - L5 Central  (X-Ray)  . Appendectomy    . Shoulder open rotator cuff repair  06/02/2012  Procedure: ROTATOR CUFF REPAIR SHOULDER OPEN;  Surgeon: Magnus Sinning, MD;  Location: [facility];  Service: Orthopedics;  Laterality: Left;  Left Shoulder Open Distal Clavicle Resection Anterior Acrominectomy and Rotator Cuff Repair     Current Outpatient Prescriptions  Medication Sig Dispense Refill  . acetaminophen (TYLENOL) 500 MG tablet Take 1,500 mg by mouth every 6 (six) hours as needed for mild pain.    Marland Kitchen allopurinol (ZYLOPRIM) 300 MG tablet Take 300 mg by mouth daily with breakfast.     . amLODipine (NORVASC) 5 MG tablet Take 1 tablet (5 mg total) by mouth daily. 30 tablet 6  . aspirin EC 81 MG tablet Take 81 mg by mouth daily.    .  dorzolamide-timolol (COSOPT) 22.3-6.8 MG/ML ophthalmic solution Place 1 drop into the right eye 2 (two) times daily.    . furosemide (LASIX) 40 MG tablet Take 40 mg by mouth daily.     Marland Kitchen glipiZIDE (GLUCOTROL) 10 MG tablet Take 10 mg by mouth daily.   0  . glipiZIDE (GLUCOTROL) 5 MG tablet Take 10 mg by mouth daily.     . hydrALAZINE (APRESOLINE) 10 MG tablet Take 10 mg by mouth 2 (two) times daily.     Marland Kitchen HYDROcodone-acetaminophen (NORCO/VICODIN) 5-325 MG per tablet Take 1 tablet by mouth every 8 (eight) hours as needed for moderate pain.   0  . lisinopril (PRINIVIL,ZESTRIL) 40 MG tablet Take 40 mg by mouth every morning.     . meloxicam (MOBIC) 15 MG tablet Take 15 mg by mouth daily.    Marland Kitchen oxyCODONE-acetaminophen (PERCOCET/ROXICET) 5-325 MG per tablet Take 1 tablet by mouth every 8 (eight) hours as needed for moderate pain or severe pain. 7 tablet 0  . pravastatin (PRAVACHOL) 20 MG tablet Take 20 mg by mouth every evening.    . ranitidine (ZANTAC) 150 MG tablet Take 150 mg by mouth 2 (two) times daily.     . timolol (BETIMOL) 0.5 % ophthalmic solution Place 1 drop into the left eye daily.     No current facility-administered medications for this visit.    Allergies:   Review of patient's allergies indicates no known allergies.    Social History:  The patient  reports that she quit smoking about 46 years ago. Her smoking use included Cigarettes. She has a 30 pack-year smoking history. She has never used smokeless tobacco. She reports that she does not drink alcohol or use illicit drugs.   Family History:  The patient's family history includes Diabetes in her sister; Heart attack in her son; Hypertension in her father, mother, and sister; Stroke in her mother.    ROS:  Please see the history of present illness.   Otherwise, review of systems are positive for knee pain, back pain, muscle pain, balance issues, appetite change.   All other systems are reviewed and negative.    PHYSICAL  EXAM: VS:  BP 127/58 mmHg  Pulse 65  Ht 5\' 4"  (1.626 m)  Wt 184 lb (83.462 kg)  BMI 31.57 kg/m2  SpO2 98%    Wt Readings from Last 3 Encounters:  06/29/14 184 lb (83.462 kg)  05/01/14 188 lb (85.276 kg)  06/02/12 187 lb (84.823 kg)     GEN: Well nourished, well developed, in no acute distress HEENT: normal Neck: no JVD at 90, no masses Cardiac:  Normal S1/S2, RRR; no murmur, no rubs or gallops, trace-1+ bilateral LE edema  Respiratory:  clear to auscultation bilaterally, no wheezing, rhonchi or rales. GI:  soft, nontender, nondistended, + BS MS: no deformity or atrophy Skin: warm and dry  Neuro:  CNs II-XII intact, Strength and sensation are intact Psych: Normal affect   EKG:  EKG is ordered today.  It demonstrates:   NSR, HR 59, LVH, lat TWI, no change from prior tracing   Recent Labs: 05/01/2014: Pro B Natriuretic peptide (BNP) 109.0* 05/10/2014: BUN 30*; Creatinine 1.3*; Potassium 4.5; Sodium 139    Recent Labs  05/01/14 1625 05/10/14 0841  K 4.4 4.5  BUN 35* 30*  CREATININE 1.7* 1.3*     Lipid Panel No results found for: CHOL, TRIG, HDL, CHOLHDL, VLDL, LDLCALC, LDLDIRECT    ASSESSMENT AND PLAN:  1.  Bradycardia: ECG today shows HR of 59.  This is improved.  She is feeling better.  Most significant issue is her knee pain.  She will remain off of Metoprolol  2. Chronic diastolic CHF:  Volume appears stable.  Lasix dose was reduced previously and her creatinine was improved at last BMET.   3. Essential hypertension: BP is improved.  Continue current regimen of Amlodipine, Hydralazine, Lisinopril. 4.  Hyperlipidemia:  Continue Pravastatin.     Current medicines are reviewed at length with the patient today.  The patient does not have concerns regarding medicines.  The following changes have been made:  no change  Labs/ tests ordered today include:  No orders of the defined types were placed in this encounter.    Disposition:   FU with Dr. Daneen Schick in  4 months   Signed, Versie Starks, MHS 06/29/2014 8:54 AM    [place], [place], [place]  [address] Phone: [phone]; Fax: [phone]

## 2014-06-29 NOTE — Patient Instructions (Signed)
Schedule follow up with Dr. Daneen Schick in 4 months.

## 2014-07-06 ENCOUNTER — Other Ambulatory Visit: Payer: Self-pay

## 2014-07-06 ENCOUNTER — Other Ambulatory Visit (INDEPENDENT_AMBULATORY_CARE_PROVIDER_SITE_OTHER): Payer: Medicare Other | Admitting: *Deleted

## 2014-07-06 DIAGNOSIS — I10 Essential (primary) hypertension: Secondary | ICD-10-CM

## 2014-07-06 DIAGNOSIS — I5032 Chronic diastolic (congestive) heart failure: Secondary | ICD-10-CM

## 2014-07-06 LAB — BASIC METABOLIC PANEL
BUN: 43 mg/dL — ABNORMAL HIGH (ref 6–23)
CHLORIDE: 110 meq/L (ref 96–112)
CO2: 23 mEq/L (ref 19–32)
CREATININE: 1.57 mg/dL — AB (ref 0.40–1.20)
Calcium: 9.5 mg/dL (ref 8.4–10.5)
GFR: 39.94 mL/min — ABNORMAL LOW (ref >60.00)
Glucose, Bld: 112 mg/dL — ABNORMAL HIGH (ref 70–99)
Potassium: 4 mEq/L (ref 3.5–5.1)
SODIUM: 141 meq/L (ref 135–145)

## 2014-07-12 DIAGNOSIS — M159 Polyosteoarthritis, unspecified: Secondary | ICD-10-CM | POA: Diagnosis not present

## 2014-07-12 DIAGNOSIS — I10 Essential (primary) hypertension: Secondary | ICD-10-CM | POA: Diagnosis not present

## 2014-07-12 DIAGNOSIS — E119 Type 2 diabetes mellitus without complications: Secondary | ICD-10-CM | POA: Diagnosis not present

## 2014-07-20 ENCOUNTER — Other Ambulatory Visit (INDEPENDENT_AMBULATORY_CARE_PROVIDER_SITE_OTHER): Payer: Medicare Other | Admitting: *Deleted

## 2014-07-20 DIAGNOSIS — I5032 Chronic diastolic (congestive) heart failure: Secondary | ICD-10-CM

## 2014-07-20 LAB — BASIC METABOLIC PANEL
BUN: 30 mg/dL — ABNORMAL HIGH (ref 6–23)
CO2: 25 meq/L (ref 19–32)
CREATININE: 1.27 mg/dL — AB (ref 0.40–1.20)
Calcium: 9.8 mg/dL (ref 8.4–10.5)
Chloride: 110 mEq/L (ref 96–112)
GFR: 51.01 mL/min — ABNORMAL LOW (ref >60.00)
Glucose, Bld: 111 mg/dL — ABNORMAL HIGH (ref 70–99)
Potassium: 4 mEq/L (ref 3.5–5.1)
Sodium: 142 mEq/L (ref 135–145)

## 2014-07-21 ENCOUNTER — Telehealth: Payer: Self-pay | Admitting: *Deleted

## 2014-07-21 NOTE — Telephone Encounter (Signed)
Pt's daughter Remo Lipps notified of lab results with verbal understanding

## 2014-08-16 ENCOUNTER — Encounter: Payer: Self-pay | Admitting: Internal Medicine

## 2014-09-04 DIAGNOSIS — E1151 Type 2 diabetes mellitus with diabetic peripheral angiopathy without gangrene: Secondary | ICD-10-CM | POA: Diagnosis not present

## 2014-09-04 DIAGNOSIS — E1140 Type 2 diabetes mellitus with diabetic neuropathy, unspecified: Secondary | ICD-10-CM | POA: Diagnosis not present

## 2014-09-06 DIAGNOSIS — R7309 Other abnormal glucose: Secondary | ICD-10-CM | POA: Diagnosis not present

## 2014-09-06 DIAGNOSIS — E1122 Type 2 diabetes mellitus with diabetic chronic kidney disease: Secondary | ICD-10-CM | POA: Diagnosis not present

## 2014-09-06 DIAGNOSIS — I10 Essential (primary) hypertension: Secondary | ICD-10-CM | POA: Diagnosis not present

## 2014-09-06 DIAGNOSIS — I129 Hypertensive chronic kidney disease with stage 1 through stage 4 chronic kidney disease, or unspecified chronic kidney disease: Secondary | ICD-10-CM | POA: Diagnosis not present

## 2014-09-06 DIAGNOSIS — N183 Chronic kidney disease, stage 3 (moderate): Secondary | ICD-10-CM | POA: Diagnosis not present

## 2014-09-14 DIAGNOSIS — E119 Type 2 diabetes mellitus without complications: Secondary | ICD-10-CM | POA: Diagnosis not present

## 2014-11-01 ENCOUNTER — Ambulatory Visit: Payer: Medicare Other | Admitting: Interventional Cardiology

## 2014-11-03 ENCOUNTER — Encounter: Payer: Self-pay | Admitting: Interventional Cardiology

## 2014-11-03 ENCOUNTER — Ambulatory Visit (INDEPENDENT_AMBULATORY_CARE_PROVIDER_SITE_OTHER): Payer: Medicare Other | Admitting: Interventional Cardiology

## 2014-11-03 VITALS — BP 130/68 | HR 62 | Ht 64.0 in | Wt 184.0 lb

## 2014-11-03 DIAGNOSIS — E785 Hyperlipidemia, unspecified: Secondary | ICD-10-CM

## 2014-11-03 DIAGNOSIS — I10 Essential (primary) hypertension: Secondary | ICD-10-CM | POA: Diagnosis not present

## 2014-11-03 DIAGNOSIS — I5032 Chronic diastolic (congestive) heart failure: Secondary | ICD-10-CM

## 2014-11-03 NOTE — Progress Notes (Signed)
Cardiology Office Note   Date:  11/03/2014   ID:  [name], DOB Jul 19, 1923, MRN [id number]  PCP:  Maggie Font, MD  Cardiologist:  Sinclair Grooms, MD   Chief Complaint  Patient presents with  . Congestive Heart Failure      History of Present Illness: [name] is a 81 y.o. female who presents for essential hypertension, diastolic dysfunction, chronic diastolic heart failure, and hyperlipidemia.  The patient is doing well from a cardiovascular standpoint. She is limited by spinal stenosis. She has late afternoon lower extremity swelling. It resolves by each morning. She has not had palpitations or syncope. She denies chest discomfort. There've been no episodes of prolonged palpitation.    Past Medical History  Diagnosis Date  . Hypertension   . GERD (gastroesophageal reflux disease)   . Arthritis   . Slipped intervertebral disc   . Bilateral carpal tunnel syndrome   . Glaucoma   . Cataracts, both eyes   . 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 Diabetes mellitus ORAL MED  . Gout   . Chronic diastolic CHF (congestive heart failure)     a. Echo (09/2012): Mild LVH, EF 65-70%, moderately elevated RVSP, moderate TR, mild to moderate MR, mild LAE, grade 2 diastolic dysfunction  . Mitral regurgitation   . History of falling RECENT FALL 1 WK AGO--   NO INJURY  . Right leg weakness   . Walker as ambulation aid   . Diabetic neuropathy both hands and legs  . Hyperlipidemia   . Rotator cuff tear     left  . PUD (peptic ulcer disease)     many yrs ago  . History of transfusion     Past Surgical History  Procedure Laterality Date  . Carpal tunnel release  09/17/2011    Procedure: CARPAL TUNNEL RELEASE;  Surgeon: Magnus Sinning, MD;  Location: [facility];  Service: Orthopedics;  Laterality: Right;  . Abdominal hysterectomy  1966  . Transthoracic echocardiogram  12-26-2008  DR Daneen Schick    NORMAL LVF/  EF  71%/   MILD  ASYMMETRIC SEPTAL HYPERTROPHY/ MILD LEFT ATRIAL ENLARGEMENT/ MODERATELY ELEVATED ESTIMATED RIGHT VENTRICULAR SYSTOLIC PRESSURE/ MILD MITRAL  &  TRICUSPID VALVE REGURG.  . Cardiovascular stress test  06-18-2005   DR Daneen Schick    NORMAL STUDY/ NO EVIDENCE ISCHEMIA/ EF 75%  . Carpal tunnel release  10/22/2011    Procedure: CARPAL TUNNEL RELEASE;  Surgeon: Magnus Sinning, MD;  Location: [facility];  Service: Orthopedics;  Laterality: Left;  . Lumbar laminectomy/decompression microdiscectomy  01/14/2012    Procedure: LUMBAR LAMINECTOMY/DECOMPRESSION MICRODISCECTOMY 3 LEVELS;  Surgeon: Magnus Sinning, MD;  Location: [facility];  Service: Orthopedics;  Laterality: N/A;  Decompression Laminectomy of L2 - L3, L3 - L4 and L4 - L5 Central  (X-Ray)  . Appendectomy    . Shoulder open rotator cuff repair  06/02/2012    Procedure: ROTATOR CUFF REPAIR SHOULDER OPEN;  Surgeon: Magnus Sinning, MD;  Location: [facility];  Service: Orthopedics;  Laterality: Left;  Left Shoulder Open Distal Clavicle Resection Anterior Acrominectomy and Rotator Cuff Repair     Current Outpatient Prescriptions  Medication Sig Dispense Refill  . acetaminophen (TYLENOL) 500 MG tablet Take 1,500 mg by mouth every 6 (six) hours as needed for mild pain.    Marland Kitchen allopurinol (ZYLOPRIM) 300 MG tablet Take 300 mg by mouth daily  with breakfast.     . amLODipine (NORVASC) 5 MG tablet Take 1 tablet (5 mg total) by mouth daily. 30 tablet 6  . aspirin EC 81 MG tablet Take 81 mg by mouth daily.    . dorzolamide-timolol (COSOPT) 22.3-6.8 MG/ML ophthalmic solution Place 1 drop into the right eye 2 (two) times daily.    . furosemide (LASIX) 40 MG tablet Take 20 mg by mouth daily. Take 40 mg by mouth on Mondays and Thursdays.    Marland Kitchen glipiZIDE (GLUCOTROL) 10 MG tablet Take 10 mg by mouth daily.   0  . hydrALAZINE (APRESOLINE) 10 MG tablet Take 10 mg by mouth 2 (two) times daily.     Marland Kitchen HYDROcodone-acetaminophen (NORCO/VICODIN) 5-325 MG per  tablet Take 1 tablet by mouth every 8 (eight) hours as needed for moderate pain.   0  . lisinopril (PRINIVIL,ZESTRIL) 40 MG tablet Take 40 mg by mouth every morning.     . meloxicam (MOBIC) 15 MG tablet Take 15 mg by mouth daily.    Marland Kitchen oxyCODONE-acetaminophen (PERCOCET/ROXICET) 5-325 MG per tablet Take 1 tablet by mouth every 8 (eight) hours as needed for moderate pain or severe pain. 7 tablet 0  . pravastatin (PRAVACHOL) 20 MG tablet Take 20 mg by mouth every evening.    . ranitidine (ZANTAC) 150 MG tablet Take 150 mg by mouth daily.     . timolol (BETIMOL) 0.5 % ophthalmic solution Place 1 drop into the left eye daily.     No current facility-administered medications for this visit.    Allergies:   Review of patient's allergies indicates no known allergies.    Social History:  The patient  reports that she quit smoking about 46 years ago. Her smoking use included Cigarettes. She has a 30 pack-year smoking history. She has never used smokeless tobacco. She reports that she does not drink alcohol or use illicit drugs.   Family History:  The patient's family history includes Diabetes in her sister; Heart attack in her son; Hypertension in her father, mother, and sister; Stroke in her mother.    ROS:  Please see the history of present illness.   Otherwise, review of systems are positive for back discomfort, peripheral neuropathy with hand and foot discomfort. Muscle aches and pains. Easy bruising. Occasional joint swelling is noted. Shortness of breath with moderate physical activity..   All other systems are reviewed and negative.    PHYSICAL EXAM: VS:  BP 130/68 mmHg  Pulse 62  Ht 5\' 4"  (1.626 m)  Wt 83.462 kg (184 lb)  BMI 31.57 kg/m2  SpO2 99% , BMI Body mass index is 31.57 kg/(m^2). GEN: Well nourished, well developed, in no acute distress HEENT: normal Neck: no JVD, carotid bruits, or masses Cardiac: RRR; no murmurs, rubs, or gallops,no edema  Respiratory:  clear to auscultation  bilaterally, normal work of breathing GI: soft, nontender, nondistended, + BS MS: no deformity or atrophy Skin: warm and dry, no rash Neuro:  Strength and sensation are intact Psych: euthymic mood, full affect   EKG:  EKG is not ordered today.    Recent Labs: 05/01/2014: Pro B Natriuretic peptide (BNP) 109.0* 07/20/2014: BUN 30*; Creatinine, Ser 1.27*; Potassium 4.0; Sodium 142    Lipid Panel No results found for: CHOL, TRIG, HDL, CHOLHDL, VLDL, LDLCALC, LDLDIRECT    Wt Readings from Last 3 Encounters:  11/03/14 83.462 kg (184 lb)  06/29/14 83.462 kg (184 lb)  05/01/14 85.276 kg (188 lb)      Other  studies Reviewed: Additional studies/ records that were reviewed today include: No old data is reviewed.    ASSESSMENT AND PLAN:  Chronic diastolic CHF (congestive heart failure)-euvolemic  Essential hypertension-excellent control  Hyperlipidemia-followed by primary care     Current medicines are reviewed at length with the patient today.  The patient does not have concerns regarding medicines.  The following changes have been made:  no change  Labs/ tests ordered today include:  No orders of the defined types were placed in this encounter.     Disposition:   FU with HS in  PRN  Signed, Sinclair Grooms, MD  11/03/2014 8:37 AM    [facility] [place], [place], [place]  [address] Phone: [phone]; Fax: [phone]

## 2014-11-03 NOTE — Patient Instructions (Signed)
Medication Instructions:  Your physician recommends that you continue on your current medications as directed. Please refer to the Current Medication list given to you today.  Labwork: None   Testing/Procedures: None   Follow-Up: Your physician recommends that you schedule a follow-up appointment as needed   Any Other Special Instructions Will Be Listed Below (If Applicable).

## 2014-12-05 DIAGNOSIS — H4011X3 Primary open-angle glaucoma, severe stage: Secondary | ICD-10-CM | POA: Diagnosis not present

## 2014-12-05 DIAGNOSIS — H40012 Open angle with borderline findings, low risk, left eye: Secondary | ICD-10-CM | POA: Diagnosis not present

## 2014-12-11 DIAGNOSIS — E1151 Type 2 diabetes mellitus with diabetic peripheral angiopathy without gangrene: Secondary | ICD-10-CM | POA: Diagnosis not present

## 2014-12-11 DIAGNOSIS — E1140 Type 2 diabetes mellitus with diabetic neuropathy, unspecified: Secondary | ICD-10-CM | POA: Diagnosis not present

## 2014-12-14 DIAGNOSIS — E1122 Type 2 diabetes mellitus with diabetic chronic kidney disease: Secondary | ICD-10-CM | POA: Diagnosis not present

## 2014-12-14 DIAGNOSIS — N183 Chronic kidney disease, stage 3 (moderate): Secondary | ICD-10-CM | POA: Diagnosis not present

## 2014-12-14 DIAGNOSIS — I129 Hypertensive chronic kidney disease with stage 1 through stage 4 chronic kidney disease, or unspecified chronic kidney disease: Secondary | ICD-10-CM | POA: Diagnosis not present

## 2014-12-21 DIAGNOSIS — E119 Type 2 diabetes mellitus without complications: Secondary | ICD-10-CM | POA: Diagnosis not present

## 2015-01-17 ENCOUNTER — Other Ambulatory Visit: Payer: Self-pay | Admitting: Physician Assistant

## 2015-02-05 ENCOUNTER — Encounter: Payer: Self-pay | Admitting: Family Medicine

## 2015-02-05 ENCOUNTER — Ambulatory Visit (INDEPENDENT_AMBULATORY_CARE_PROVIDER_SITE_OTHER): Payer: Medicare Other | Admitting: Family Medicine

## 2015-02-05 VITALS — BP 140/78 | HR 86 | Temp 98.2°F | Resp 16 | Ht 64.0 in | Wt 181.0 lb

## 2015-02-05 DIAGNOSIS — Z23 Encounter for immunization: Secondary | ICD-10-CM

## 2015-02-05 DIAGNOSIS — I5032 Chronic diastolic (congestive) heart failure: Secondary | ICD-10-CM | POA: Diagnosis not present

## 2015-02-05 DIAGNOSIS — M48061 Spinal stenosis, lumbar region without neurogenic claudication: Secondary | ICD-10-CM

## 2015-02-05 DIAGNOSIS — M4806 Spinal stenosis, lumbar region: Secondary | ICD-10-CM | POA: Diagnosis not present

## 2015-02-05 DIAGNOSIS — I10 Essential (primary) hypertension: Secondary | ICD-10-CM

## 2015-02-05 DIAGNOSIS — E785 Hyperlipidemia, unspecified: Secondary | ICD-10-CM

## 2015-02-05 DIAGNOSIS — E119 Type 2 diabetes mellitus without complications: Secondary | ICD-10-CM

## 2015-02-05 LAB — COMPREHENSIVE METABOLIC PANEL
ALK PHOS: 149 U/L — AB (ref 33–130)
ALT: 8 U/L (ref 6–29)
AST: 14 U/L (ref 10–35)
Albumin: 4.1 g/dL (ref 3.6–5.1)
BUN: 27 mg/dL — ABNORMAL HIGH (ref 7–25)
CO2: 22 mmol/L (ref 20–31)
Calcium: 9.6 mg/dL (ref 8.6–10.4)
Chloride: 110 mmol/L (ref 98–110)
Creat: 1.54 mg/dL — ABNORMAL HIGH (ref 0.60–0.88)
Glucose, Bld: 85 mg/dL (ref 70–99)
Potassium: 4.3 mmol/L (ref 3.5–5.3)
Sodium: 144 mmol/L (ref 135–146)
TOTAL PROTEIN: 7 g/dL (ref 6.1–8.1)
Total Bilirubin: 0.4 mg/dL (ref 0.2–1.2)

## 2015-02-05 LAB — LIPID PANEL
Cholesterol: 167 mg/dL (ref 125–200)
HDL: 48 mg/dL (ref >=46)
LDL CALC: 88 mg/dL (ref <130)
Total CHOL/HDL Ratio: 3.5 Ratio (ref <=5.0)
Triglycerides: 153 mg/dL — ABNORMAL HIGH (ref <150)
VLDL: 31 mg/dL — ABNORMAL HIGH (ref <30)

## 2015-02-05 MED ORDER — GLIPIZIDE 10 MG PO TABS: 10.0000 mg | ORAL_TABLET | Freq: Every day | ORAL | Status: DC

## 2015-02-05 MED ORDER — AMLODIPINE BESYLATE 5 MG PO TABS: 5.0000 mg | ORAL_TABLET | Freq: Every day | ORAL | Status: DC

## 2015-02-05 MED ORDER — ALLOPURINOL 300 MG PO TABS: 300.0000 mg | ORAL_TABLET | Freq: Every day | ORAL | Status: DC

## 2015-02-05 MED ORDER — RANITIDINE HCL 150 MG PO TABS: 150.0000 mg | ORAL_TABLET | Freq: Every day | ORAL | Status: DC

## 2015-02-05 MED ORDER — MELOXICAM 15 MG PO TABS: 15.0000 mg | ORAL_TABLET | Freq: Every day | ORAL | Status: DC

## 2015-02-05 MED ORDER — LISINOPRIL 40 MG PO TABS: 40.0000 mg | ORAL_TABLET | Freq: Every morning | ORAL | Status: DC

## 2015-02-05 MED ORDER — PRAVASTATIN SODIUM 20 MG PO TABS: 20.0000 mg | ORAL_TABLET | Freq: Every evening | ORAL | Status: DC

## 2015-02-05 NOTE — Assessment & Plan Note (Signed)
We'll obtain her A1c today goal will be to keep an A1c less than 8% and keep her from being hypoglycemic. She will continue the glipizide once a day

## 2015-02-05 NOTE — Assessment & Plan Note (Addendum)
Followed by cardiology, he is currently compensated

## 2015-02-05 NOTE — Patient Instructions (Signed)
We will call with lab results Flu shot today Release of records- Dr. Katy Fitch, Dr. Iona Beard  F/u 3 MONTHS

## 2015-02-05 NOTE — Addendum Note (Signed)
Addended by: Vic Blackbird F on: 02/05/2015 05:58 PM   Modules accepted: Orders

## 2015-02-05 NOTE — Assessment & Plan Note (Signed)
Continue meloxicam she did have hydrocodone on her chart appears to be when she had some type of surgery she states that these medications cause a lot of side effects therefore she does not want to be on them chronically which I agree with.

## 2015-02-05 NOTE — Assessment & Plan Note (Signed)
Blood pressure is well controlled no change in medication her fasting labs obtained today. She is doing quite well with the assistance of her children.

## 2015-02-05 NOTE — Progress Notes (Signed)
Patient ID: [name], female   DOB: [birth year], 81 y.o.   MRN: [id number]   Subjective:    Patient ID: [name], female    DOB: Sep 07, 1923, 81 y.o.   MRN: [id number]  Patient presents for [facility] Care patient here to establish care. Her previous primary care provider was Dr. Iona Beard, her cardiologist is Dr. Daneen Schick. Dr. Katy Fitch is her ophthalmologist  She has history of diabetes mellitus type 2 states that she has diabetic neuropathy she also has history of congestive heart failure she takes Lasix daily. She has no history of heart disease or stroke. She does have hypertension which she is taking her medicines as prescribed she recently had a visit with the cardiologist which I reviewed. She has remote history of gout attack in her toes she's been on allopurinol for the past 10 years and has not had any difficulties.  She has history of glaucoma which she is followed by her ophthalmologist for.  History of spinal stenosis and arthritis she has had back surgery as well as rotator cuff surgery she takes meloxicam once a day. She walks with a walker she lives at home with her daughter who is her caretaker she is actually here today with her son.  She states that she has had all of her immunizations she would like to have her flu shot.  Medications and chart reviewed    Review Of Systems:  GEN- denies fatigue, fever, weight loss,weakness, recent illness HEENT- denies eye drainage, change in vision, nasal discharge, CVS- denies chest pain, palpitations RESP- denies SOB, cough, wheeze ABD- denies N/V, change in stools, abd pain GU- denies dysuria, hematuria, dribbling, incontinence MSK- +  joint pain, muscle aches, injury Neuro- denies headache, dizziness, syncope, seizure activity       Objective:    BP 140/78 mmHg  Pulse 86  Temp(Src) 98.2 F (36.8 C) (Oral)  Resp 16  Ht 5\' 4"  (1.626 m)  Wt 181 lb (82.101 kg)  BMI 31.05 kg/m2 GEN- NAD, alert and  oriented x3, very pleasant ,walks with walker  HEENT- PERRL, EOMI, non injected sclera, pink conjunctiva, MMM, oropharynx clear Neck- Supple, no bruit CVS- RRR, no murmur, occ PVC RESP-CTAB ABD-NABS,soft,NT,ND EXT- pedal edema Pulses- Radial, DP- 2+        Assessment & Plan:      Problem List Items Addressed This Visit    Spinal stenosis of lumbar region   Hyperlipidemia   Relevant Orders   Lipid panel   Essential hypertension   Relevant Orders   CBC with Differential/Platelet   Comprehensive metabolic panel   Diabetes mellitus, type II   Relevant Orders   Hemoglobin A1c   Microalbumin / creatinine urine ratio   Chronic diastolic CHF (congestive heart failure) - Primary    Other Visit Diagnoses    Need for prophylactic vaccination and inoculation against influenza        Relevant Orders    Flu Vaccine QUAD 36+ mos IM (Fluarix & Fluzone Quad PF (Completed)       Note: This dictation was prepared with Dragon dictation along with smaller Company secretary. Any transcriptional errors that result from this process are unintentional.

## 2015-02-06 ENCOUNTER — Encounter: Payer: Self-pay | Admitting: Family Medicine

## 2015-02-06 ENCOUNTER — Other Ambulatory Visit: Payer: Self-pay | Admitting: *Deleted

## 2015-02-06 LAB — MICROALBUMIN / CREATININE URINE RATIO
CREATININE, URINE: 137.6 mg/dL
MICROALB/CREAT RATIO: 9.4 mg/g (ref 0.0–30.0)
Microalb, Ur: 1.3 mg/dL (ref <2.0)

## 2015-02-06 LAB — CBC WITH DIFFERENTIAL/PLATELET
BASOS ABS: 0 10*3/uL (ref 0.0–0.1)
BASOS PCT: 0 % (ref 0–1)
EOS ABS: 0.2 10*3/uL (ref 0.0–0.7)
Eosinophils Relative: 2 % (ref 0–5)
HCT: 35.8 % — ABNORMAL LOW (ref 36.0–46.0)
Hemoglobin: 11.4 g/dL — ABNORMAL LOW (ref 12.0–15.0)
Lymphocytes Relative: 19 % (ref 12–46)
Lymphs Abs: 1.5 10*3/uL (ref 0.7–4.0)
MCH: 29.8 pg (ref 26.0–34.0)
MCHC: 31.8 g/dL (ref 30.0–36.0)
MCV: 93.5 fL (ref 78.0–100.0)
MPV: 9.7 fL (ref 8.6–12.4)
Monocytes Absolute: 0.3 10*3/uL (ref 0.1–1.0)
Monocytes Relative: 4 % (ref 3–12)
NEUTROS PCT: 75 % (ref 43–77)
Neutro Abs: 5.9 10*3/uL (ref 1.7–7.7)
PLATELETS: 269 10*3/uL (ref 150–400)
RBC: 3.83 MIL/uL — AB (ref 3.87–5.11)
RDW: 16.9 % — ABNORMAL HIGH (ref 11.5–15.5)
WBC: 7.8 10*3/uL (ref 4.0–10.5)

## 2015-02-06 LAB — HEMOGLOBIN A1C
HEMOGLOBIN A1C: 5.8 % — AB (ref <5.7)
Mean Plasma Glucose: 120 mg/dL — ABNORMAL HIGH (ref <117)

## 2015-02-06 MED ORDER — GLIPIZIDE 5 MG PO TABS: 10.0000 mg | ORAL_TABLET | Freq: Every day | ORAL | Status: DC

## 2015-02-07 ENCOUNTER — Telehealth: Payer: Self-pay | Admitting: *Deleted

## 2015-02-07 ENCOUNTER — Other Ambulatory Visit: Payer: Self-pay | Admitting: *Deleted

## 2015-02-07 MED ORDER — GLIPIZIDE 5 MG PO TABS: 5.0000 mg | ORAL_TABLET | Freq: Every day | ORAL | Status: DC

## 2015-02-07 MED ORDER — RANITIDINE HCL 150 MG PO TABS: 150.0000 mg | ORAL_TABLET | Freq: Two times a day (BID) | ORAL | Status: DC

## 2015-02-07 NOTE — Telephone Encounter (Signed)
Received fax requesting clarification on prescription.   States that Zantac was prescribed with orders of 1 PO QD, qty #180.  Clarification sent to pharmacy.

## 2015-02-19 DIAGNOSIS — E1151 Type 2 diabetes mellitus with diabetic peripheral angiopathy without gangrene: Secondary | ICD-10-CM | POA: Diagnosis not present

## 2015-02-19 DIAGNOSIS — E1140 Type 2 diabetes mellitus with diabetic neuropathy, unspecified: Secondary | ICD-10-CM | POA: Diagnosis not present

## 2015-02-22 ENCOUNTER — Other Ambulatory Visit: Payer: Self-pay | Admitting: *Deleted

## 2015-02-22 MED ORDER — HYDRALAZINE HCL 10 MG PO TABS: 10.0000 mg | ORAL_TABLET | Freq: Two times a day (BID) | ORAL | Status: DC

## 2015-02-22 MED ORDER — GLIPIZIDE 5 MG PO TABS: 5.0000 mg | ORAL_TABLET | Freq: Every day | ORAL | Status: DC

## 2015-02-22 MED ORDER — MELOXICAM 15 MG PO TABS: 15.0000 mg | ORAL_TABLET | Freq: Every day | ORAL | Status: DC

## 2015-02-22 MED ORDER — AMLODIPINE BESYLATE 5 MG PO TABS: 5.0000 mg | ORAL_TABLET | Freq: Every day | ORAL | Status: DC

## 2015-02-22 MED ORDER — ALLOPURINOL 300 MG PO TABS: 300.0000 mg | ORAL_TABLET | Freq: Every day | ORAL | Status: DC

## 2015-02-22 MED ORDER — RANITIDINE HCL 150 MG PO TABS: 150.0000 mg | ORAL_TABLET | Freq: Two times a day (BID) | ORAL | Status: DC

## 2015-02-22 MED ORDER — LISINOPRIL 40 MG PO TABS: 40.0000 mg | ORAL_TABLET | Freq: Every morning | ORAL | Status: DC

## 2015-02-22 MED ORDER — PRAVASTATIN SODIUM 20 MG PO TABS: 20.0000 mg | ORAL_TABLET | Freq: Every evening | ORAL | Status: DC

## 2015-02-22 NOTE — Telephone Encounter (Signed)
Received fax requesting refills on Allopurinol, Mobic, lisinopril, Hydralazine, Glipizide, Ranitidine, Pravastatin, and Amlodipine with 90 day supplies.   Refill appropriate and filled per protocol.

## 2015-03-19 ENCOUNTER — Encounter: Payer: Self-pay | Admitting: Family Medicine

## 2015-03-19 ENCOUNTER — Ambulatory Visit (INDEPENDENT_AMBULATORY_CARE_PROVIDER_SITE_OTHER): Payer: Medicare Other | Admitting: Family Medicine

## 2015-03-19 VITALS — BP 142/80 | HR 78 | Temp 97.8°F | Resp 16 | Ht 63.5 in | Wt 182.0 lb

## 2015-03-19 DIAGNOSIS — G629 Polyneuropathy, unspecified: Secondary | ICD-10-CM | POA: Insufficient documentation

## 2015-03-19 DIAGNOSIS — G6289 Other specified polyneuropathies: Secondary | ICD-10-CM

## 2015-03-19 DIAGNOSIS — R350 Frequency of micturition: Secondary | ICD-10-CM

## 2015-03-19 DIAGNOSIS — E119 Type 2 diabetes mellitus without complications: Secondary | ICD-10-CM

## 2015-03-19 LAB — URINALYSIS, ROUTINE W REFLEX MICROSCOPIC
Bilirubin Urine: NEGATIVE
GLUCOSE, UA: NEGATIVE
HGB URINE DIPSTICK: NEGATIVE
Ketones, ur: NEGATIVE
LEUKOCYTES UA: NEGATIVE
Nitrite: NEGATIVE
Protein, ur: NEGATIVE
SPECIFIC GRAVITY, URINE: 1.02 (ref 1.001–1.035)
pH: 5.5 (ref 5.0–8.0)

## 2015-03-19 MED ORDER — GABAPENTIN 100 MG PO CAPS: 100.0000 mg | ORAL_CAPSULE | Freq: Every day | ORAL | Status: DC

## 2015-03-19 NOTE — Assessment & Plan Note (Signed)
This is a longstanding problem for her, discussed with pt and daughter, we will try 100 mg a gabapentin at bedtime

## 2015-03-19 NOTE — Assessment & Plan Note (Signed)
Note A1c was 5.8% decrease her glipizide down to 5 mg. Her blood sugar this morning was 132 and trying to avoid severe hypoglycemia she is almost 81 years old

## 2015-03-19 NOTE — Patient Instructions (Signed)
Take the gabapentin at bedtime  We will call with results from urine test  F/U 3 months

## 2015-03-19 NOTE — Progress Notes (Signed)
Patient ID: [name], female   DOB: [birth year], 81 y.o.   MRN: [id number]   Subjective:    Patient ID: [name], female    DOB: June 19, 1923, 81 y.o.   MRN: [id number]  Patient presents for Hand tingling  is here today with her daughter. She is to concern she's had some dysuria for a few days she thought was due to her water pill so she held this for 2 days but then restarted. She had some mild pressure with urination now things seem to be back to normal. She did not have any change in her bowels no fever no nausea or vomiting. She is also noticed that during that time she felt numbness all over her body when she urinates.   She has known history of peripheral neuropathy she's also had what sounds like carpal tunnel in the hands. The tingling and numbness are getting worse and her anger is in her feet. She also has known spinal stenosis. She would like to try something to help with the tingling and numbness. It does not cause her any difficulties at night she is sleeping well. It does bother her with her activities during the day.    Review Of Systems: per above   GEN- denies fatigue, fever, weight loss,weakness, recent illness HEENT- denies eye drainage, change in vision, nasal discharge, CVS- denies chest pain, palpitations RESP- denies SOB, cough, wheeze ABD- denies N/V, change in stools, abd pain GU- + dysuria, hematuria, dribbling, incontinence MSK- denies joint pain, muscle aches, injury Neuro- denies headache, dizziness, syncope, seizure activity       Objective:    BP 142/80 mmHg  Pulse 78  Temp(Src) 97.8 F (36.6 C) (Oral)  Resp 16  Ht 5' 3.5" (1.613 m)  Wt 182 lb (82.555 kg)  BMI 31.73 kg/m2 GEN- NAD, alert and oriented x3 HEENT- PERRL, EOMI, non injected sclera, pink conjunctiva, MMM, oropharynx clear CVS- RRR, no murmur RESP-CTAB ABD-NABS,soft,NT,ND, no CVA tenderness  Neuro- Decreased monofilament bilat feet, intact bilat hands, normal tone Upper and lower  ext, sesnation grossly in tact,motor in tact uses walker EXT- No edema Pulses- Radial, DP- 2+        Assessment & Plan:      Problem List Items Addressed This Visit    Peripheral neuropathy ([place])    This is a longstanding problem for her, discussed with pt and daughter, we will try 100 mg a gabapentin at bedtime      Relevant Medications   gabapentin (NEURONTIN) 100 MG capsule   Diabetes mellitus, type II ([place]) - Primary    Note A1c was 5.8% decrease her glipizide down to 5 mg. Her blood sugar this morning was 132 and trying to avoid severe hypoglycemia she is almost 81 years old       Other Visit Diagnoses    Urinary frequency        Send UA and urine culture, pt back on regular medications tx pending true infection    Relevant Orders    Urinalysis, Routine w reflex microscopic (not at [facility]) (Completed)    Urine culture       Note: This dictation was prepared with Dragon dictation along with smaller phrase technology. Any transcriptional errors that result from this process are unintentional.

## 2015-03-20 LAB — URINE CULTURE
Colony Count: NO GROWTH
Organism ID, Bacteria: NO GROWTH

## 2015-03-28 ENCOUNTER — Other Ambulatory Visit: Payer: Self-pay | Admitting: Family Medicine

## 2015-03-28 NOTE — Telephone Encounter (Signed)
Refill appropriate and filled per protocol. 

## 2015-03-29 DIAGNOSIS — H40012 Open angle with borderline findings, low risk, left eye: Secondary | ICD-10-CM | POA: Diagnosis not present

## 2015-03-29 DIAGNOSIS — H401113 Primary open-angle glaucoma, right eye, severe stage: Secondary | ICD-10-CM | POA: Diagnosis not present

## 2015-03-29 DIAGNOSIS — Z961 Presence of intraocular lens: Secondary | ICD-10-CM | POA: Diagnosis not present

## 2015-04-25 ENCOUNTER — Other Ambulatory Visit: Payer: Self-pay | Admitting: *Deleted

## 2015-04-25 MED ORDER — FUROSEMIDE 40 MG PO TABS: 20.0000 mg | ORAL_TABLET | Freq: Every day | ORAL | Status: DC

## 2015-04-25 NOTE — Telephone Encounter (Signed)
Received fax requesting refill on Lasix.   Refill appropriate and filled per protocol. 

## 2015-05-25 ENCOUNTER — Ambulatory Visit: Payer: Medicare Other | Admitting: Family Medicine

## 2015-05-30 ENCOUNTER — Ambulatory Visit (INDEPENDENT_AMBULATORY_CARE_PROVIDER_SITE_OTHER): Payer: Medicare Other | Admitting: Family Medicine

## 2015-05-30 VITALS — BP 142/80 | HR 88 | Temp 98.6°F | Resp 16 | Ht 63.5 in | Wt 182.0 lb

## 2015-05-30 DIAGNOSIS — I10 Essential (primary) hypertension: Secondary | ICD-10-CM | POA: Diagnosis not present

## 2015-05-30 DIAGNOSIS — N183 Chronic kidney disease, stage 3 unspecified: Secondary | ICD-10-CM

## 2015-05-30 DIAGNOSIS — N185 Chronic kidney disease, stage 5: Secondary | ICD-10-CM | POA: Insufficient documentation

## 2015-05-30 DIAGNOSIS — K219 Gastro-esophageal reflux disease without esophagitis: Secondary | ICD-10-CM | POA: Diagnosis not present

## 2015-05-30 DIAGNOSIS — E119 Type 2 diabetes mellitus without complications: Secondary | ICD-10-CM | POA: Diagnosis not present

## 2015-05-30 MED ORDER — PANTOPRAZOLE SODIUM 40 MG PO TBEC: 40.0000 mg | DELAYED_RELEASE_TABLET | Freq: Every day | ORAL | Status: DC

## 2015-05-30 NOTE — Assessment & Plan Note (Signed)
BP looks good for age, no change to meds

## 2015-05-30 NOTE — Progress Notes (Signed)
Patient ID: [name], female   DOB: 08-27-1923, 82 y.o.   MRN: [id number]   Subjective:    Patient ID: [name], female    DOB: Aug 28, 1923, 82 y.o.   MRN: [id number]  Patient presents for Acid Reflux  patient here with acid reflux. She is history of GERD she's currently on ranitidine but this is no longer helping. Her daughter gave her some of her protonix and it resolve. She feels a burning sensation after she eats and worse when she lies down after dinner she feels like it comes back up into her throat. In the morning she will have a lot of sputum. But she does not feel sick and does not have a cough.  History of diabetes mellitus her last A1c was 5.8% decrease her glipizide to a half a tablet a day. She would also like her kidneys recheck she has chronic kidney disease stage III which is due to hypertension diabetes over the years. No hypoglycemia, states sugars have been good, no Log with her today  Review Of Systems:  GEN- denies fatigue, fever, weight loss,weakness, recent illness HEENT- denies eye drainage, change in vision, nasal discharge, CVS- denies chest pain, palpitations RESP- denies SOB, cough, wheeze ABD- denies N/V, change in stools, +abd pain GU- denies dysuria, hematuria, dribbling, incontinence MSK- denies joint pain, muscle aches, injury Neuro- denies headache, dizziness, syncope, seizure activity       Objective:    BP 142/80 mmHg  Pulse 88  Temp(Src) 98.6 F (37 C) (Oral)  Resp 16  Ht 5' 3.5" (1.613 m)  Wt 182 lb (82.555 kg)  BMI 31.73 kg/m2 GEN- NAD, alert and oriented x3 HEENT- PERRL, EOMI, non injected sclera, pink conjunctiva, MMM, oropharynx clear Neck- Supple, no thyromegaly CVS- RRR, no murmur RESP-CTAB ABD-NABS,soft,NT,ND EXT- No edema Pulses- Radial 2+        Assessment & Plan:      Problem List Items Addressed This Visit    GERD (gastroesophageal reflux disease)   Relevant Medications   pantoprazole (PROTONIX) 40 MG tablet    Diabetes mellitus, type II ([facility]) - Primary   Relevant Orders   CBC with Differential/Platelet   Basic metabolic panel   Hemoglobin A1c   CKD (chronic kidney disease), stage III      Note: This dictation was prepared with Dragon dictation along with smaller phrase technology. Any transcriptional errors that result from this process are unintentional.

## 2015-05-30 NOTE — Patient Instructions (Signed)
Take new protonix for acid reflux Stop the ranitidine We will call with lab results Change F/U to 3 months

## 2015-05-30 NOTE — Assessment & Plan Note (Signed)
DM well controlled, recheck A1C may be able to D/C glipizide all together currently on 2.5mg 

## 2015-05-30 NOTE — Assessment & Plan Note (Signed)
Change to protonix D/C ZANTAC

## 2015-05-31 LAB — BASIC METABOLIC PANEL
BUN: 30 mg/dL — AB (ref 7–25)
CO2: 24 mmol/L (ref 20–31)
Calcium: 9.5 mg/dL (ref 8.6–10.4)
Chloride: 105 mmol/L (ref 98–110)
Creat: 1.65 mg/dL — ABNORMAL HIGH (ref 0.60–0.88)
GLUCOSE: 84 mg/dL (ref 70–99)
POTASSIUM: 4.8 mmol/L (ref 3.5–5.3)
Sodium: 142 mmol/L (ref 135–146)

## 2015-05-31 LAB — HEMOGLOBIN A1C
Hgb A1c MFr Bld: 6.1 % — ABNORMAL HIGH (ref <5.7)
Mean Plasma Glucose: 128 mg/dL — ABNORMAL HIGH (ref <117)

## 2015-05-31 LAB — CBC WITH DIFFERENTIAL/PLATELET
Basophils Absolute: 0 10*3/uL (ref 0.0–0.1)
Basophils Relative: 0 % (ref 0–1)
EOS PCT: 3 % (ref 0–5)
Eosinophils Absolute: 0.3 10*3/uL (ref 0.0–0.7)
HCT: 33.9 % — ABNORMAL LOW (ref 36.0–46.0)
HEMOGLOBIN: 10.4 g/dL — AB (ref 12.0–15.0)
LYMPHS ABS: 1.7 10*3/uL (ref 0.7–4.0)
LYMPHS PCT: 20 % (ref 12–46)
MCH: 28.3 pg (ref 26.0–34.0)
MCHC: 30.7 g/dL (ref 30.0–36.0)
MCV: 92.1 fL (ref 78.0–100.0)
MONO ABS: 0.7 10*3/uL (ref 0.1–1.0)
MPV: 10 fL (ref 8.6–12.4)
Monocytes Relative: 8 % (ref 3–12)
Neutro Abs: 5.9 10*3/uL (ref 1.7–7.7)
Neutrophils Relative %: 69 % (ref 43–77)
Platelets: 313 10*3/uL (ref 150–400)
RBC: 3.68 MIL/uL — AB (ref 3.87–5.11)
RDW: 16.9 % — ABNORMAL HIGH (ref 11.5–15.5)
WBC: 8.6 10*3/uL (ref 4.0–10.5)

## 2015-06-13 ENCOUNTER — Other Ambulatory Visit: Payer: Self-pay | Admitting: *Deleted

## 2015-06-13 MED ORDER — GABAPENTIN 100 MG PO CAPS: 100.0000 mg | ORAL_CAPSULE | Freq: Every day | ORAL | Status: DC

## 2015-06-13 NOTE — Telephone Encounter (Signed)
Received fax requesting refill on gabapentin.   Refill appropriate and filled per protocol.

## 2015-07-30 DIAGNOSIS — E1140 Type 2 diabetes mellitus with diabetic neuropathy, unspecified: Secondary | ICD-10-CM | POA: Diagnosis not present

## 2015-07-30 DIAGNOSIS — E1151 Type 2 diabetes mellitus with diabetic peripheral angiopathy without gangrene: Secondary | ICD-10-CM | POA: Diagnosis not present

## 2015-08-13 DIAGNOSIS — E119 Type 2 diabetes mellitus without complications: Secondary | ICD-10-CM | POA: Diagnosis not present

## 2015-08-15 DIAGNOSIS — H401113 Primary open-angle glaucoma, right eye, severe stage: Secondary | ICD-10-CM | POA: Diagnosis not present

## 2015-08-15 DIAGNOSIS — E119 Type 2 diabetes mellitus without complications: Secondary | ICD-10-CM | POA: Diagnosis not present

## 2015-08-15 DIAGNOSIS — H31091 Other chorioretinal scars, right eye: Secondary | ICD-10-CM | POA: Diagnosis not present

## 2015-08-15 DIAGNOSIS — H40012 Open angle with borderline findings, low risk, left eye: Secondary | ICD-10-CM | POA: Diagnosis not present

## 2015-08-15 DIAGNOSIS — H10413 Chronic giant papillary conjunctivitis, bilateral: Secondary | ICD-10-CM | POA: Diagnosis not present

## 2015-08-28 ENCOUNTER — Other Ambulatory Visit: Payer: Self-pay | Admitting: Family Medicine

## 2015-08-28 NOTE — Telephone Encounter (Signed)
Refill appropriate and filled per protocol. 

## 2015-08-29 ENCOUNTER — Encounter: Payer: Self-pay | Admitting: Family Medicine

## 2015-08-29 ENCOUNTER — Ambulatory Visit (INDEPENDENT_AMBULATORY_CARE_PROVIDER_SITE_OTHER): Payer: Medicare Other | Admitting: Family Medicine

## 2015-08-29 ENCOUNTER — Ambulatory Visit: Payer: Medicare Other | Admitting: Family Medicine

## 2015-08-29 VITALS — BP 144/80 | HR 82 | Temp 97.9°F | Resp 16 | Ht 63.0 in | Wt 181.0 lb

## 2015-08-29 DIAGNOSIS — E119 Type 2 diabetes mellitus without complications: Secondary | ICD-10-CM

## 2015-08-29 DIAGNOSIS — N183 Chronic kidney disease, stage 3 unspecified: Secondary | ICD-10-CM

## 2015-08-29 DIAGNOSIS — G6289 Other specified polyneuropathies: Secondary | ICD-10-CM

## 2015-08-29 DIAGNOSIS — E785 Hyperlipidemia, unspecified: Secondary | ICD-10-CM | POA: Diagnosis not present

## 2015-08-29 DIAGNOSIS — I10 Essential (primary) hypertension: Secondary | ICD-10-CM | POA: Diagnosis not present

## 2015-08-29 DIAGNOSIS — K219 Gastro-esophageal reflux disease without esophagitis: Secondary | ICD-10-CM | POA: Diagnosis not present

## 2015-08-29 LAB — COMPREHENSIVE METABOLIC PANEL
ALT: 6 U/L (ref 6–29)
AST: 13 U/L (ref 10–35)
Albumin: 4.2 g/dL (ref 3.6–5.1)
Alkaline Phosphatase: 157 U/L — ABNORMAL HIGH (ref 33–130)
BUN: 42 mg/dL — AB (ref 7–25)
CHLORIDE: 109 mmol/L (ref 98–110)
CO2: 22 mmol/L (ref 20–31)
Calcium: 9.5 mg/dL (ref 8.6–10.4)
Creat: 1.91 mg/dL — ABNORMAL HIGH (ref 0.60–0.88)
GLUCOSE: 125 mg/dL — AB (ref 70–99)
POTASSIUM: 4.1 mmol/L (ref 3.5–5.3)
Sodium: 143 mmol/L (ref 135–146)
Total Bilirubin: 0.3 mg/dL (ref 0.2–1.2)
Total Protein: 7.3 g/dL (ref 6.1–8.1)

## 2015-08-29 LAB — CBC WITH DIFFERENTIAL/PLATELET
BASOS ABS: 0 {cells}/uL (ref 0–200)
Basophils Relative: 0 %
EOS ABS: 225 {cells}/uL (ref 15–500)
Eosinophils Relative: 3 %
HCT: 32.4 % — ABNORMAL LOW (ref 35.0–45.0)
Hemoglobin: 10 g/dL — ABNORMAL LOW (ref 12.0–15.0)
LYMPHS PCT: 20 %
Lymphs Abs: 1500 cells/uL (ref 850–3900)
MCH: 29 pg (ref 27.0–33.0)
MCHC: 30.9 g/dL — ABNORMAL LOW (ref 32.0–36.0)
MCV: 93.9 fL (ref 80.0–100.0)
MONOS PCT: 5 %
MPV: 9.5 fL (ref 7.5–12.5)
Monocytes Absolute: 375 cells/uL (ref 200–950)
Neutro Abs: 5400 cells/uL (ref 1500–7800)
Neutrophils Relative %: 72 %
PLATELETS: 264 10*3/uL (ref 140–400)
RBC: 3.45 MIL/uL — ABNORMAL LOW (ref 3.80–5.10)
RDW: 16.5 % — AB (ref 11.0–15.0)
WBC: 7.5 10*3/uL (ref 3.8–10.8)

## 2015-08-29 LAB — HEMOGLOBIN A1C
HEMOGLOBIN A1C: 6.1 % — AB (ref <5.7)
Mean Plasma Glucose: 128 mg/dL

## 2015-08-29 MED ORDER — RANITIDINE HCL 150 MG PO TABS: 150.0000 mg | ORAL_TABLET | Freq: Every day | ORAL | Status: DC

## 2015-08-29 NOTE — Assessment & Plan Note (Signed)
Blood pressures well-controlled for her age

## 2015-08-29 NOTE — Patient Instructions (Signed)
Take  1 zantac at bedtime Take protonix in the morning  We will call with lab results  F/U 4  MONTHS

## 2015-08-29 NOTE — Progress Notes (Signed)
Patient ID: [name], female   DOB: [birth year], 83 y.o.   MRN: [id number]    Subjective:    Patient ID: [name], female    DOB: 1923-06-12, 82 y.o.   MRN: [id number]  Patient presents for 3 month F/U Patient follow chronic medical problems. Her only concern she continues to have some reflux problems. I changed her to Protonix which helps some. But she still gets it in the middle of her throat. She denies any vomiting or shortness of breath no chest pain. She still gets symptoms an hour so after she eats. She has not had any dysphagia  Diabetes mellitus she states her blood sugars run 110 to 1:30 at the max she is taking glipizide 2.5 mg her last A1c was 6.1%  She is ambulating with her walker most of the time. She's not had any falls.  As recently seen by her eye doctor  Review Of Systems:  GEN- denies fatigue, fever, weight loss,weakness, recent illness HEENT- denies eye drainage, change in vision, nasal discharge, CVS- denies chest pain, palpitations RESP- denies SOB, cough, wheeze ABD- denies N/V, change in stools, abd pain GU- denies dysuria, hematuria, dribbling, incontinence MSK-+ joint pain, muscle aches, injury Neuro- denies headache, dizziness, syncope, seizure activity       Objective:    BP 144/80 mmHg  Pulse 82  Temp(Src) 97.9 F (36.6 C) (Oral)  Resp 16  Ht 5\' 3"  (1.6 m)  Wt 181 lb (82.101 kg)  BMI 32.07 kg/m2 GEN- NAD, alert and oriented x3 HEENT- PERRL, EOMI, non injected sclera, pink conjunctiva, MMM, oropharynx clear Neck- Supple, no LAD  CVS- RRR, no murmur RESP-CTAB ABD-NABS,soft,NT,ND EXT- pedal edema Pulses- Radial, DP- 2+        Assessment & Plan:      Problem List Items Addressed This Visit    None      Note: This dictation was prepared with Dragon dictation along with smaller phrase technology. Any transcriptional errors that result from this process are unintentional.

## 2015-08-29 NOTE — Assessment & Plan Note (Signed)
Cholesterol is at goal continue pravastatin

## 2015-08-29 NOTE — Assessment & Plan Note (Signed)
Her diabetes is well-controlled if her A1c is less than 6.5% and I'm didn't discontinue the glipizide altogether she will continue her aspirin and her cholesterol medication.

## 2015-08-29 NOTE — Addendum Note (Signed)
Addended by: Sheral Flow on: 08/29/2015 09:25 AM   Modules accepted: Orders

## 2015-08-29 NOTE — Assessment & Plan Note (Signed)
I will have her resume Zantac at bedtime and continue with the Protonix in the morning

## 2015-09-28 ENCOUNTER — Other Ambulatory Visit: Payer: Self-pay | Admitting: Family Medicine

## 2015-09-28 ENCOUNTER — Other Ambulatory Visit: Payer: Medicare Other

## 2015-09-28 DIAGNOSIS — N189 Chronic kidney disease, unspecified: Secondary | ICD-10-CM

## 2015-09-28 LAB — BASIC METABOLIC PANEL WITH GFR
BUN: 36 mg/dL — ABNORMAL HIGH (ref 7–25)
CO2: 21 mmol/L (ref 20–31)
Calcium: 9.3 mg/dL (ref 8.6–10.4)
Chloride: 108 mmol/L (ref 98–110)
Creat: 1.77 mg/dL — ABNORMAL HIGH (ref 0.60–0.88)
GFR, Est African American: 29 mL/min — ABNORMAL LOW (ref >=60)
GFR, Est Non African American: 25 mL/min — ABNORMAL LOW (ref >=60)
Glucose, Bld: 91 mg/dL (ref 70–99)
Potassium: 4.1 mmol/L (ref 3.5–5.3)
Sodium: 141 mmol/L (ref 135–146)

## 2015-10-29 DIAGNOSIS — E1140 Type 2 diabetes mellitus with diabetic neuropathy, unspecified: Secondary | ICD-10-CM | POA: Diagnosis not present

## 2015-10-29 DIAGNOSIS — E1151 Type 2 diabetes mellitus with diabetic peripheral angiopathy without gangrene: Secondary | ICD-10-CM | POA: Diagnosis not present

## 2015-11-19 ENCOUNTER — Telehealth: Payer: Self-pay | Admitting: Interventional Cardiology

## 2015-11-19 NOTE — Telephone Encounter (Signed)
Returned pt call. Spoke with pt who ask that I speak with her daughter [name]. Pt has been having increased exertional sob, LE swelling and weight gain. Pt denies chest pain, palpitations. Pt sts that she cannot wait until Sept to be seen. Adv pt that Dr. Tamala Julian is out of the office for the next 2 weeks. appt scheduled with Raylene Everts flex for 6/30 @ 8am. Pt and her daughter voiced appreciation and verbalized understanding.

## 2015-11-19 NOTE — Telephone Encounter (Signed)
New message  Pt. Daughter call   Pt c/o Shortness Of Breath: STAT if SOB developed within the last 24 hours or pt is noticeably SOB on the phone  1. Are you currently SOB (can you hear that pt is SOB on the phone)? yes  2. How long have you been experiencing SOB? Pt daughter says about a month 1/2  3. Are you SOB when sitting or when up moving around? Moving around   4. Are you currently experiencing any other symptoms? Swollen legs along with SOB  Comment: an appt was made for pt, but daughter wants pt to be seen sooner. Please call back to discuss

## 2015-11-22 NOTE — Progress Notes (Addendum)
Cardiology Office Note    Date:  11/23/2015   ID:  [name], DOB September 02, 1923, MRN [id number]  PCP:  Vic Blackbird, MD  Cardiologist:  Dr. Tamala Julian   CC: Weight gain, LE edema, SOB  History of Present Illness:  [name] is a 82 y.o. female with a history of HTN, chronic diastolic CHF, prior stroke, CKD, mild-mod MR, mod TR, HLD, DMT2,  and spinal stenosis who presents to clinic for evaluation of weight gain, LE edema, and SOB.   Seen by Deanna Dopp PA-C in 2015 for bradycardia. Her BP was also soft and BB was discontinued.   She was last seen by Dr. Tamala Julian in 10/2014 and felt to be doing well from a cardiac standpoint. Weight was 184 lbs at the time . She had been on lasix 20mg  daily.   She was doing well until about 3-4 months ago when she started noticing some lower extremity edema, orthopnea and PND. She's been propping up on 3-4 pillows versus her normal 2. She denies chest pain, but she does feel like she gets short of breath with exertion. No dizziness or syncope. No palpitations. No blood in her stool or urine. SHe has had some leg cramping recently.     Past Medical History  Diagnosis Date  . Hypertension   . GERD (gastroesophageal reflux disease)   . Arthritis   . Slipped intervertebral disc   . Bilateral carpal tunnel syndrome   . Glaucoma   . Cataracts, both eyes   . 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 Diabetes mellitus [facility]  . Gout   . Chronic diastolic CHF (congestive heart failure) ([place])     a. Echo (09/2012): Mild LVH, EF 65-70%, moderately elevated RVSP, moderate TR, mild to moderate MR, mild LAE, grade 2 diastolic dysfunction  . Mitral regurgitation   . History of falling RECENT FALL 1 WK AGO--   NO INJURY  . Right leg weakness   . Walker as ambulation aid   . Diabetic neuropathy (HCC) both hands and legs  . Hyperlipidemia   . Rotator cuff tear     left  . PUD (peptic ulcer disease)     many yrs ago  . History of  transfusion     Past Surgical History  Procedure Laterality Date  . Carpal tunnel release  09/17/2011    Procedure: CARPAL TUNNEL RELEASE;  Surgeon: Deanna Sinning, MD;  Location: [facility];  Service: Orthopedics;  Laterality: Right;  . Abdominal hysterectomy  1966  . Transthoracic echocardiogram  12-26-2008  DR [name]    NORMAL LVF/  EF  71%/   MILD ASYMMETRIC SEPTAL HYPERTROPHY/ MILD LEFT ATRIAL ENLARGEMENT/ MODERATELY ELEVATED ESTIMATED RIGHT VENTRICULAR SYSTOLIC PRESSURE/ MILD MITRAL  &  TRICUSPID VALVE REGURG.  . Cardiovascular stress test  06-18-2005   DR [name]    NORMAL STUDY/ NO EVIDENCE ISCHEMIA/ EF 75%  . Carpal tunnel release  10/22/2011    Procedure: CARPAL TUNNEL RELEASE;  Surgeon: Deanna Sinning, MD;  Location: [facility] [facility];  Service: Orthopedics;  Laterality: Left;  . Lumbar laminectomy/decompression microdiscectomy  01/14/2012    Procedure: LUMBAR LAMINECTOMY/DECOMPRESSION MICRODISCECTOMY 3 LEVELS;  Surgeon: Deanna Sinning, MD;  Location: [facility];  Service: Orthopedics;  Laterality: N/A;  Decompression Laminectomy of L2 - L3, L3 - L4 and L4 - L5 Central  (X-Ray)  . Appendectomy    .  Shoulder open rotator cuff repair  06/02/2012    Procedure: ROTATOR CUFF REPAIR SHOULDER OPEN;  Surgeon: Deanna Sinning, MD;  Location: [facility];  Service: Orthopedics;  Laterality: Left;  Left Shoulder Open Distal Clavicle Resection Anterior Acrominectomy and Rotator Cuff Repair  . Spine surgery    . Eye surgery      Current Medications: Outpatient Prescriptions Prior to Visit  Medication Sig Dispense Refill  . acetaminophen (TYLENOL) 500 MG tablet Take 1,500 mg by mouth every 6 (six) hours as needed for mild pain.    Marland Kitchen allopurinol (ZYLOPRIM) 300 MG tablet Take 1 tablet (300 mg total) by mouth daily with breakfast. 90 tablet 2  . amLODipine (NORVASC) 5 MG tablet Take 1 tablet (5 mg total) by mouth daily. 90 tablet 3  . aspirin EC 81 MG tablet Take  81 mg by mouth daily.    . dorzolamide-timolol (COSOPT) 22.3-6.8 MG/ML ophthalmic solution Place 1 drop into the right eye 2 (two) times daily.    Marland Kitchen gabapentin (NEURONTIN) 100 MG capsule Take 1 capsule (100 mg total) by mouth at bedtime. 90 capsule 3  . glipiZIDE (GLUCOTROL) 5 MG tablet Take 1 tablet by mouth  daily before breakfast 60 tablet 6  . hydrALAZINE (APRESOLINE) 10 MG tablet Take 1 tablet (10 mg total) by mouth 2 (two) times daily. 180 tablet 2  . lisinopril (PRINIVIL,ZESTRIL) 40 MG tablet Take 1 tablet (40 mg total) by mouth every morning. 90 tablet 2  . meloxicam (MOBIC) 15 MG tablet Take 1 tablet by mouth  daily 90 tablet 1  . pantoprazole (PROTONIX) 40 MG tablet Take 1 tablet (40 mg total) by mouth daily. 90 tablet 2  . pravastatin (PRAVACHOL) 20 MG tablet Take 1 tablet (20 mg total) by mouth every evening. 90 tablet 2  . ranitidine (ZANTAC) 150 MG tablet Take 1 tablet (150 mg total) by mouth at bedtime. 90 tablet 1  . timolol (BETIMOL) 0.5 % ophthalmic solution Place 1 drop into the left eye daily.    . furosemide (LASIX) 40 MG tablet Take 0.5 tablets (20 mg total) by mouth daily. Take 40 mg by mouth on Mondays and Thursdays. (Patient not taking: Reported on 11/23/2015) 30 tablet 3   No facility-administered medications prior to visit.     Allergies:   Review of patient's allergies indicates no known allergies.   Social History   Social History  . Marital Status: Widowed    Spouse Name: N/A  . Number of Children: N/A  . Years of Education: N/A   Social History Main Topics  . Smoking status: Former Smoker -- 1.00 packs/day for 30 years    Types: Cigarettes    Quit date: 05/28/1968  . Smokeless tobacco: Never Used  . Alcohol Use: No  . Drug Use: No  . Sexual Activity: Not Asked   Other Topics Concern  . None   Social History Narrative     Family History:  The patient's family history includes Alcohol abuse in her brother; Arthritis in her sister, sister, sister,  sister, and sister; Cancer in her sister; Diabetes in her sister; Heart attack in her son; Hypertension in her brother, father, mother, sister, sister, and sister; Stroke in her mother.     ROS:   Please see the history of present illness.    ROS All other systems reviewed and are negative.   PHYSICAL EXAM:   VS:  BP 122/50 mmHg  Pulse 68  Ht 5\' 3"  (1.6 m)  Wt  183 lb 9.6 oz (83.28 kg)  BMI 32.53 kg/m2   GEN: Well nourished, well developed, in no acute distress HEENT: normal Neck: no JVD, carotid bruits, or masses Cardiac: RRR; no murmurs, rubs, or gallops, Trace edema  Respiratory:  clear to auscultation bilaterally,  Decreased breath sounds at bases GI: soft, nontender, nondistended, + BS MS: no deformity or atrophy Skin: warm and dry, no rash Neuro:  Alert and Oriented x 3, Strength and sensation are intact Psych: euthymic mood, full affect  Wt Readings from Last 3 Encounters:  11/23/15 183 lb 9.6 oz (83.28 kg)  08/29/15 181 lb (82.101 kg)  05/30/15 182 lb (82.555 kg)      Studies/Labs Reviewed:   EKG:  EKG is ordered today.  The ekg ordered today demonstrates sinus with PAC, HR 67 non specific ST/TW abnormality  Recent Labs: 08/29/2015: ALT 6; Hemoglobin 10.0*; Platelets 264 09/28/2015: BUN 36*; Creat 1.77*; Potassium 4.1; Sodium 141   Lipid Panel    Component Value Date/Time   CHOL 167 02/05/2015 1136   TRIG 153* 02/05/2015 1136   HDL 48 02/05/2015 1136   CHOLHDL 3.5 02/05/2015 1136   VLDL 31* 02/05/2015 1136   LDLCALC 88 02/05/2015 1136    Additional studies/ records that were reviewed today include:   - Carotid US (6/05): Bilateral plaque without hemodynamically significant stenosis - Abdominal US (8/11): No AAA - Nuclear (05/2005): No ischemia, normal study, EF 75% - Echo (09/2012): Mild LVH, EF 65-70%, moderately elevated RVSP, moderate TR, mild to moderate MR, mild LAE, grade 2 diastolic dysfunction  ASSESSMENT & PLAN:   Acute on chronic diastolic  CHF: She does appear to perhaps have mild volume overload, but not extremely obvious. Weight is mildly increased from April (181--> 183 lbs).  I will order a BNP. She has been on Lasix 20 mg daily. I will increase this to 40 mg daily 4 days with 51mEq Kdur x4 and get a BMP today and Monday (tuesday a holiday). She does have a history of mild to moderate MR and moderate TR, but I do not hear a loud murmur on exam. Given her age, I do not think we need to repeat an echo at this point. I have also discussed low sodium diet and fluid restriction with her. She can take Lasix 40 mg with a 20 mEq K-Dur when necessary if she feels like she is retaining fluid.  CKD: creat last month 1.77. Will monitor closely on increased Lasix  HTN: BP well controlled on current regimen  HLD: continue statin   Leg cramps: will check electrolytes  Medication Adjustments/Labs and Tests Ordered: Current medicines are reviewed at length with the patient today.  Concerns regarding medicines are outlined above.  Medication changes, Labs and Tests ordered today are listed in the Patient Instructions below. Patient Instructions  Medication Instructions:  Your physician has recommended you make the following change in your medication:  1.  TAKE Lasix 40 mg 1 tablet daily X's 4 days (starting today) then decrease to 1/2 tablet daily 2.  START Potassium 20 meq taking 1 tablet daily X's 4 days then ONLY TAKE IF YOU HAVE TO TAKE 40 MG LASIX  Labwork: TODAY:  BMET & BNP 11/26/15:     BMET  Testing/Procedures: None ordered  Follow-Up: Your physician recommends that you schedule a follow-up appointment in: [place] AS PLANNED   Any Other Special Instructions Will Be Listed Below (If Applicable). Heart Failure Heart failure is a condition  in which the heart has trouble pumping blood. This means your heart does not pump blood efficiently for your body to work well. In some cases of heart failure,  fluid may back up into your lungs or you may have swelling (edema) in your lower legs. Heart failure is usually a long-term (chronic) condition. It is important for you to take good care of yourself and follow your health care provider's treatment plan. CAUSES  Some health conditions can cause heart failure. Those health conditions include:  High blood pressure (hypertension). Hypertension causes the heart muscle to work harder than normal. When pressure in the blood vessels is high, the heart needs to pump (contract) with more force in order to circulate blood throughout the body. High blood pressure eventually causes the heart to become stiff and weak.  Coronary artery disease (CAD). CAD is the buildup of cholesterol and fat (plaque) in the arteries of the heart. The blockage in the arteries deprives the heart muscle of oxygen and blood. This can cause chest pain and may lead to a heart attack. High blood pressure can also contribute to CAD.  Heart attack (myocardial infarction). A heart attack occurs when one or more arteries in the heart become blocked. The loss of oxygen damages the muscle tissue of the heart. When this happens, part of the heart muscle dies. The injured tissue does not contract as well and weakens the heart's ability to pump blood.  Abnormal heart valves. When the heart valves do not open and close properly, it can cause heart failure. This makes the heart muscle pump harder to keep the blood flowing.  Heart muscle disease (cardiomyopathy or myocarditis). Heart muscle disease is damage to the heart muscle from a variety of causes. These can include drug or alcohol abuse, infections, or unknown reasons. These can increase the risk of heart failure.  Lung disease. Lung disease makes the heart work harder because the lungs do not work properly. This can cause a strain on the heart, leading it to fail.  Diabetes. Diabetes increases the risk of heart failure. High blood sugar  contributes to high fat (lipid) levels in the blood. Diabetes can also cause slow damage to tiny blood vessels that carry important nutrients to the heart muscle. When the heart does not get enough oxygen and food, it can cause the heart to become weak and stiff. This leads to a heart that does not contract efficiently.  Other conditions can contribute to heart failure. These include abnormal heart rhythms, thyroid problems, and low blood counts (anemia). Certain unhealthy behaviors can increase the risk of heart failure, including:  Being overweight.  Smoking or chewing tobacco.  Eating foods high in fat and cholesterol.  Abusing illicit drugs or alcohol.  Lacking physical activity. SYMPTOMS  Heart failure symptoms may vary and can be hard to detect. Symptoms may include:  Shortness of breath with activity, such as climbing stairs.  Persistent cough.  Swelling of the feet, ankles, legs, or abdomen.  Unexplained weight gain.  Difficulty breathing when lying flat (orthopnea).  Waking from sleep because of the need to sit up and get more air.  Rapid heartbeat.  Fatigue and loss of energy.  Feeling light-headed, dizzy, or close to fainting.  Loss of appetite.  Nausea.  Increased urination during the night (nocturia). DIAGNOSIS  A diagnosis of heart failure is based on your history, symptoms, physical examination, and diagnostic tests. Diagnostic tests for heart failure may include:  Echocardiography.  Electrocardiography.  Chest X-ray.  Blood tests.  Exercise stress test.  Cardiac angiography.  Radionuclide scans. TREATMENT  Treatment is aimed at managing the symptoms of heart failure. Medicines, behavioral changes, or surgical intervention may be necessary to treat heart failure.  Medicines to help treat heart failure may include:  Angiotensin-converting enzyme (ACE) inhibitors. This type of medicine blocks the effects of a blood protein called  angiotensin-converting enzyme. ACE inhibitors relax (dilate) the blood vessels and help lower blood pressure.  Angiotensin receptor blockers (ARBs). This type of medicine blocks the actions of a blood protein called angiotensin. Angiotensin receptor blockers dilate the blood vessels and help lower blood pressure.  Water pills (diuretics). Diuretics cause the kidneys to remove salt and water from the blood. The extra fluid is removed through urination. This loss of extra fluid lowers the volume of blood the heart pumps.  Beta blockers. These prevent the heart from beating too fast and improve heart muscle strength.  Digitalis. This increases the force of the heartbeat.  Healthy behavior changes include:  Obtaining and maintaining a healthy weight.  Stopping smoking or chewing tobacco.  Eating heart-healthy foods.  Limiting or avoiding alcohol.  Stopping illicit drug use.  Physical activity as directed by your health care provider.  Surgical treatment for heart failure may include:  A procedure to open blocked arteries, repair damaged heart valves, or remove damaged heart muscle tissue.  A pacemaker to improve heart muscle function and control certain abnormal heart rhythms.  An internal cardioverter defibrillator to treat certain serious abnormal heart rhythms.  A left ventricular assist device (LVAD) to assist the pumping ability of the heart. HOME CARE INSTRUCTIONS   Take medicines only as directed by your health care provider. Medicines are important in reducing the workload of your heart, slowing the progression of heart failure, and improving your symptoms.  Do not stop taking your medicine unless directed by your health care provider.  Do not skip any dose of medicine.  Refill your prescriptions before you run out of medicine. Your medicines are needed every day.  Engage in moderate physical activity if directed by your health care provider. Moderate physical activity  can benefit some people. The elderly and people with severe heart failure should consult with a health care provider for physical activity recommendations.  Eat heart-healthy foods. Food choices should be free of trans fat and low in saturated fat, cholesterol, and salt (sodium). Healthy choices include fresh or frozen fruits and vegetables, fish, lean meats, legumes, fat-free or low-fat dairy products, and whole grain or high fiber foods. Talk to a dietitian to learn more about heart-healthy foods.  Limit sodium if directed by your health care provider. Sodium restriction may reduce symptoms of heart failure in some people. Talk to a dietitian to learn more about heart-healthy seasonings.  Use healthy cooking methods. Healthy cooking methods include roasting, grilling, broiling, baking, poaching, steaming, or stir-frying. Talk to a dietitian to learn more about healthy cooking methods.  Limit fluids if directed by your health care provider. Fluid restriction may reduce symptoms of heart failure in some people.  Weigh yourself every day. Daily weights are important in the early recognition of excess fluid. You should weigh yourself every morning after you urinate and before you eat breakfast. Wear the same amount of clothing each time you weigh yourself. Record your daily weight. Provide your health care provider with your weight record.  Monitor and record your blood pressure if directed by your health care provider.  Check your pulse if directed  by your health care provider.  Lose weight if directed by your health care provider. Weight loss may reduce symptoms of heart failure in some people.  Stop smoking or chewing tobacco. Nicotine makes your heart work harder by causing your blood vessels to constrict. Do not use nicotine gum or patches before talking to your health care provider.  Keep all follow-up visits as directed by your health care provider. This is important.  Limit alcohol intake  to no more than 1 drink per day for nonpregnant women and 2 drinks per day for men. One drink equals 12 ounces of beer, 5 ounces of wine, or 1 ounces of hard liquor. Drinking more than that is harmful to your heart. Tell your health care provider if you drink alcohol several times a week. Talk with your health care provider about whether alcohol is safe for you. If your heart has already been damaged by alcohol or you have severe heart failure, drinking alcohol should be stopped completely.  Stop illicit drug use.  Stay up-to-date with immunizations. It is especially important to prevent respiratory infections through current pneumococcal and influenza immunizations.  Manage other health conditions such as hypertension, diabetes, thyroid disease, or abnormal heart rhythms as directed by your health care provider.  Learn to manage stress.  Plan rest periods when fatigued.  Learn strategies to manage high temperatures. If the weather is extremely hot:  Avoid vigorous physical activity.  Use air conditioning or fans or seek a cooler location.  Avoid caffeine and alcohol.  Wear loose-fitting, lightweight, and light-colored clothing.  Learn strategies to manage cold temperatures. If the weather is extremely cold:  Avoid vigorous physical activity.  Layer clothes.  Wear mittens or gloves, a hat, and a scarf when going outside.  Avoid alcohol.  Obtain ongoing education and support as needed.  Participate in or seek rehabilitation as needed to maintain or improve independence and quality of life. SEEK MEDICAL CARE IF:   You have a rapid weight gain.  You have increasing shortness of breath that is unusual for you.  You are unable to participate in your usual physical activities.  You tire easily.  You cough more than normal, especially with physical activity.  You have any or more swelling in areas such as your hands, feet, ankles, or abdomen.  You are unable to sleep  because it is hard to breathe.  You feel like your heart is beating fast (palpitations).  You become dizzy or light-headed upon standing up. SEEK IMMEDIATE MEDICAL CARE IF:   You have difficulty breathing.  There is a change in mental status such as decreased alertness or difficulty with concentration.  You have a pain or discomfort in your chest.  You have an episode of fainting (syncope). MAKE SURE YOU:   Understand these instructions.  Will watch your condition.  Will get help right away if you are not doing well or get worse.   This information is not intended to replace advice given to you by your health care provider. Make sure you discuss any questions you have with your health care provider.   Document Released: 05/12/2005 Document Revised: 09/26/2014 Document Reviewed: 06/11/2012 Elsevier Interactive Patient Education [employer].     If you need a refill on your cardiac medications before your next appointment, please call your pharmacy.       Signed, Angelena Form, PA-C  11/23/2015 8:48 AM    [address] [place], [place]  [address] Phone: [phone]; Fax: [phone]

## 2015-11-23 ENCOUNTER — Encounter: Payer: Self-pay | Admitting: Physician Assistant

## 2015-11-23 ENCOUNTER — Other Ambulatory Visit: Payer: Self-pay | Admitting: *Deleted

## 2015-11-23 ENCOUNTER — Ambulatory Visit (INDEPENDENT_AMBULATORY_CARE_PROVIDER_SITE_OTHER): Payer: Medicare Other | Admitting: Physician Assistant

## 2015-11-23 VITALS — BP 122/50 | HR 68 | Ht 63.0 in | Wt 183.6 lb

## 2015-11-23 DIAGNOSIS — E785 Hyperlipidemia, unspecified: Secondary | ICD-10-CM

## 2015-11-23 DIAGNOSIS — I5032 Chronic diastolic (congestive) heart failure: Secondary | ICD-10-CM | POA: Diagnosis not present

## 2015-11-23 DIAGNOSIS — I10 Essential (primary) hypertension: Secondary | ICD-10-CM | POA: Diagnosis not present

## 2015-11-23 LAB — BASIC METABOLIC PANEL
BUN: 39 mg/dL — AB (ref 7–25)
CALCIUM: 9.7 mg/dL (ref 8.6–10.4)
CO2: 22 mmol/L (ref 20–31)
CREATININE: 1.58 mg/dL — AB (ref 0.60–0.88)
Chloride: 108 mmol/L (ref 98–110)
Glucose, Bld: 99 mg/dL (ref 65–99)
Potassium: 4.5 mmol/L (ref 3.5–5.3)
Sodium: 143 mmol/L (ref 135–146)

## 2015-11-23 LAB — BRAIN NATRIURETIC PEPTIDE: Brain Natriuretic Peptide: 89.6 pg/mL (ref <100)

## 2015-11-23 MED ORDER — POTASSIUM CHLORIDE CRYS ER 20 MEQ PO TBCR: 20.0000 meq | EXTENDED_RELEASE_TABLET | Freq: Every day | ORAL | Status: DC

## 2015-11-23 MED ORDER — FUROSEMIDE 40 MG PO TABS: ORAL_TABLET | ORAL | Status: DC

## 2015-11-23 NOTE — Patient Instructions (Addendum)
Medication Instructions:  Your physician has recommended you make the following change in your medication:  1.  TAKE Lasix 40 mg 1 tablet daily X's 4 days (starting today) then decrease to 1/2 tablet daily 2.  START Potassium 20 meq taking 1 tablet daily X's 4 days then ONLY TAKE IF YOU HAVE TO TAKE 40 MG LASIX  Labwork: TODAY:  BMET & BNP 11/26/15:     BMET  Testing/Procedures: None ordered  Follow-Up: Your physician recommends that you schedule a follow-up appointment in: [place] AS PLANNED   Any Other Special Instructions Will Be Listed Below (If Applicable). Heart Failure Heart failure is a condition in which the heart has trouble pumping blood. This means your heart does not pump blood efficiently for your body to work well. In some cases of heart failure, fluid may back up into your lungs or you may have swelling (edema) in your lower legs. Heart failure is usually a long-term (chronic) condition. It is important for you to take good care of yourself and follow your health care provider's treatment plan. CAUSES  Some health conditions can cause heart failure. Those health conditions include:  High blood pressure (hypertension). Hypertension causes the heart muscle to work harder than normal. When pressure in the blood vessels is high, the heart needs to pump (contract) with more force in order to circulate blood throughout the body. High blood pressure eventually causes the heart to become stiff and weak.  Coronary artery disease (CAD). CAD is the buildup of cholesterol and fat (plaque) in the arteries of the heart. The blockage in the arteries deprives the heart muscle of oxygen and blood. This can cause chest pain and may lead to a heart attack. High blood pressure can also contribute to CAD.  Heart attack (myocardial infarction). A heart attack occurs when one or more arteries in the heart become blocked. The loss of oxygen damages the muscle tissue of  the heart. When this happens, part of the heart muscle dies. The injured tissue does not contract as well and weakens the heart's ability to pump blood.  Abnormal heart valves. When the heart valves do not open and close properly, it can cause heart failure. This makes the heart muscle pump harder to keep the blood flowing.  Heart muscle disease (cardiomyopathy or myocarditis). Heart muscle disease is damage to the heart muscle from a variety of causes. These can include drug or alcohol abuse, infections, or unknown reasons. These can increase the risk of heart failure.  Lung disease. Lung disease makes the heart work harder because the lungs do not work properly. This can cause a strain on the heart, leading it to fail.  Diabetes. Diabetes increases the risk of heart failure. High blood sugar contributes to high fat (lipid) levels in the blood. Diabetes can also cause slow damage to tiny blood vessels that carry important nutrients to the heart muscle. When the heart does not get enough oxygen and food, it can cause the heart to become weak and stiff. This leads to a heart that does not contract efficiently.  Other conditions can contribute to heart failure. These include abnormal heart rhythms, thyroid problems, and low blood counts (anemia). Certain unhealthy behaviors can increase the risk of heart failure, including:  Being overweight.  Smoking or chewing tobacco.  Eating foods high in fat and cholesterol.  Abusing illicit drugs or alcohol.  Lacking physical activity. SYMPTOMS  Heart failure symptoms may vary and can be hard  to detect. Symptoms may include:  Shortness of breath with activity, such as climbing stairs.  Persistent cough.  Swelling of the feet, ankles, legs, or abdomen.  Unexplained weight gain.  Difficulty breathing when lying flat (orthopnea).  Waking from sleep because of the need to sit up and get more air.  Rapid heartbeat.  Fatigue and loss of  energy.  Feeling light-headed, dizzy, or close to fainting.  Loss of appetite.  Nausea.  Increased urination during the night (nocturia). DIAGNOSIS  A diagnosis of heart failure is based on your history, symptoms, physical examination, and diagnostic tests. Diagnostic tests for heart failure may include:  Echocardiography.  Electrocardiography.  Chest X-ray.  Blood tests.  Exercise stress test.  Cardiac angiography.  Radionuclide scans. TREATMENT  Treatment is aimed at managing the symptoms of heart failure. Medicines, behavioral changes, or surgical intervention may be necessary to treat heart failure.  Medicines to help treat heart failure may include:  Angiotensin-converting enzyme (ACE) inhibitors. This type of medicine blocks the effects of a blood protein called angiotensin-converting enzyme. ACE inhibitors relax (dilate) the blood vessels and help lower blood pressure.  Angiotensin receptor blockers (ARBs). This type of medicine blocks the actions of a blood protein called angiotensin. Angiotensin receptor blockers dilate the blood vessels and help lower blood pressure.  Water pills (diuretics). Diuretics cause the kidneys to remove salt and water from the blood. The extra fluid is removed through urination. This loss of extra fluid lowers the volume of blood the heart pumps.  Beta blockers. These prevent the heart from beating too fast and improve heart muscle strength.  Digitalis. This increases the force of the heartbeat.  Healthy behavior changes include:  Obtaining and maintaining a healthy weight.  Stopping smoking or chewing tobacco.  Eating heart-healthy foods.  Limiting or avoiding alcohol.  Stopping illicit drug use.  Physical activity as directed by your health care provider.  Surgical treatment for heart failure may include:  A procedure to open blocked arteries, repair damaged heart valves, or remove damaged heart muscle tissue.  A  pacemaker to improve heart muscle function and control certain abnormal heart rhythms.  An internal cardioverter defibrillator to treat certain serious abnormal heart rhythms.  A left ventricular assist device (LVAD) to assist the pumping ability of the heart. HOME CARE INSTRUCTIONS   Take medicines only as directed by your health care provider. Medicines are important in reducing the workload of your heart, slowing the progression of heart failure, and improving your symptoms.  Do not stop taking your medicine unless directed by your health care provider.  Do not skip any dose of medicine.  Refill your prescriptions before you run out of medicine. Your medicines are needed every day.  Engage in moderate physical activity if directed by your health care provider. Moderate physical activity can benefit some people. The elderly and people with severe heart failure should consult with a health care provider for physical activity recommendations.  Eat heart-healthy foods. Food choices should be free of trans fat and low in saturated fat, cholesterol, and salt (sodium). Healthy choices include fresh or frozen fruits and vegetables, fish, lean meats, legumes, fat-free or low-fat dairy products, and whole grain or high fiber foods. Talk to a dietitian to learn more about heart-healthy foods.  Limit sodium if directed by your health care provider. Sodium restriction may reduce symptoms of heart failure in some people. Talk to a dietitian to learn more about heart-healthy seasonings.  Use healthy cooking methods. Healthy cooking  methods include roasting, grilling, broiling, baking, poaching, steaming, or stir-frying. Talk to a dietitian to learn more about healthy cooking methods.  Limit fluids if directed by your health care provider. Fluid restriction may reduce symptoms of heart failure in some people.  Weigh yourself every day. Daily weights are important in the early recognition of excess fluid.  You should weigh yourself every morning after you urinate and before you eat breakfast. Wear the same amount of clothing each time you weigh yourself. Record your daily weight. Provide your health care provider with your weight record.  Monitor and record your blood pressure if directed by your health care provider.  Check your pulse if directed by your health care provider.  Lose weight if directed by your health care provider. Weight loss may reduce symptoms of heart failure in some people.  Stop smoking or chewing tobacco. Nicotine makes your heart work harder by causing your blood vessels to constrict. Do not use nicotine gum or patches before talking to your health care provider.  Keep all follow-up visits as directed by your health care provider. This is important.  Limit alcohol intake to no more than 1 drink per day for nonpregnant women and 2 drinks per day for men. One drink equals 12 ounces of beer, 5 ounces of wine, or 1 ounces of hard liquor. Drinking more than that is harmful to your heart. Tell your health care provider if you drink alcohol several times a week. Talk with your health care provider about whether alcohol is safe for you. If your heart has already been damaged by alcohol or you have severe heart failure, drinking alcohol should be stopped completely.  Stop illicit drug use.  Stay up-to-date with immunizations. It is especially important to prevent respiratory infections through current pneumococcal and influenza immunizations.  Manage other health conditions such as hypertension, diabetes, thyroid disease, or abnormal heart rhythms as directed by your health care provider.  Learn to manage stress.  Plan rest periods when fatigued.  Learn strategies to manage high temperatures. If the weather is extremely hot:  Avoid vigorous physical activity.  Use air conditioning or fans or seek a cooler location.  Avoid caffeine and alcohol.  Wear loose-fitting,  lightweight, and light-colored clothing.  Learn strategies to manage cold temperatures. If the weather is extremely cold:  Avoid vigorous physical activity.  Layer clothes.  Wear mittens or gloves, a hat, and a scarf when going outside.  Avoid alcohol.  Obtain ongoing education and support as needed.  Participate in or seek rehabilitation as needed to maintain or improve independence and quality of life. SEEK MEDICAL CARE IF:   You have a rapid weight gain.  You have increasing shortness of breath that is unusual for you.  You are unable to participate in your usual physical activities.  You tire easily.  You cough more than normal, especially with physical activity.  You have any or more swelling in areas such as your hands, feet, ankles, or abdomen.  You are unable to sleep because it is hard to breathe.  You feel like your heart is beating fast (palpitations).  You become dizzy or light-headed upon standing up. SEEK IMMEDIATE MEDICAL CARE IF:   You have difficulty breathing.  There is a change in mental status such as decreased alertness or difficulty with concentration.  You have a pain or discomfort in your chest.  You have an episode of fainting (syncope). MAKE SURE YOU:   Understand these instructions.  Will watch  your condition.  Will get help right away if you are not doing well or get worse.   This information is not intended to replace advice given to you by your health care provider. Make sure you discuss any questions you have with your health care provider.   Document Released: 05/12/2005 Document Revised: 09/26/2014 Document Reviewed: 06/11/2012 Elsevier Interactive Patient Education [employer].     If you need a refill on your cardiac medications before your next appointment, please call your pharmacy.

## 2015-11-26 ENCOUNTER — Other Ambulatory Visit (INDEPENDENT_AMBULATORY_CARE_PROVIDER_SITE_OTHER): Payer: Medicare Other | Admitting: *Deleted

## 2015-11-26 DIAGNOSIS — I5032 Chronic diastolic (congestive) heart failure: Secondary | ICD-10-CM

## 2015-11-26 LAB — BASIC METABOLIC PANEL
BUN: 41 mg/dL — ABNORMAL HIGH (ref 7–25)
CO2: 21 mmol/L (ref 20–31)
Calcium: 9.2 mg/dL (ref 8.6–10.4)
Chloride: 110 mmol/L (ref 98–110)
Creat: 1.95 mg/dL — ABNORMAL HIGH (ref 0.60–0.88)
GLUCOSE: 113 mg/dL — AB (ref 65–99)
POTASSIUM: 4.4 mmol/L (ref 3.5–5.3)
SODIUM: 142 mmol/L (ref 135–146)

## 2015-12-04 ENCOUNTER — Telehealth: Payer: Self-pay | Admitting: *Deleted

## 2015-12-04 ENCOUNTER — Other Ambulatory Visit: Payer: Self-pay | Admitting: Family Medicine

## 2015-12-04 NOTE — Telephone Encounter (Signed)
Refill appropriate and filled per protocol. 

## 2015-12-04 NOTE — Telephone Encounter (Signed)
Returned call to [employer] [facility].  Discussed directions for Lasix.

## 2015-12-04 NOTE — Telephone Encounter (Signed)
[facility] needs clarification on the furosemide rx before they will fill it for the patient. The number to call is [phone] and reference number [id number]. Thanks, MI

## 2015-12-05 ENCOUNTER — Ambulatory Visit (INDEPENDENT_AMBULATORY_CARE_PROVIDER_SITE_OTHER): Payer: Medicare Other | Admitting: Family Medicine

## 2015-12-05 DIAGNOSIS — Z23 Encounter for immunization: Secondary | ICD-10-CM

## 2016-01-02 ENCOUNTER — Ambulatory Visit (INDEPENDENT_AMBULATORY_CARE_PROVIDER_SITE_OTHER): Payer: Medicare Other | Admitting: Family Medicine

## 2016-01-02 ENCOUNTER — Encounter: Payer: Self-pay | Admitting: Family Medicine

## 2016-01-02 VITALS — BP 136/62 | HR 62 | Temp 97.9°F | Resp 16 | Ht 63.0 in | Wt 181.0 lb

## 2016-01-02 DIAGNOSIS — E785 Hyperlipidemia, unspecified: Secondary | ICD-10-CM | POA: Diagnosis not present

## 2016-01-02 DIAGNOSIS — E119 Type 2 diabetes mellitus without complications: Secondary | ICD-10-CM | POA: Diagnosis not present

## 2016-01-02 DIAGNOSIS — I10 Essential (primary) hypertension: Secondary | ICD-10-CM | POA: Diagnosis not present

## 2016-01-02 DIAGNOSIS — N183 Chronic kidney disease, stage 3 unspecified: Secondary | ICD-10-CM

## 2016-01-02 LAB — CBC WITH DIFFERENTIAL/PLATELET
Basophils Absolute: 0 {cells}/uL (ref 0–200)
Basophils Relative: 0 %
Eosinophils Absolute: 201 {cells}/uL (ref 15–500)
Eosinophils Relative: 3 %
HCT: 30.8 % — ABNORMAL LOW (ref 35.0–45.0)
Hemoglobin: 9.8 g/dL — ABNORMAL LOW (ref 12.0–15.0)
Lymphocytes Relative: 18 %
Lymphs Abs: 1206 {cells}/uL (ref 850–3900)
MCH: 29.1 pg (ref 27.0–33.0)
MCHC: 31.8 g/dL — ABNORMAL LOW (ref 32.0–36.0)
MCV: 91.4 fL (ref 80.0–100.0)
MPV: 9.8 fL (ref 7.5–12.5)
Monocytes Absolute: 402 {cells}/uL (ref 200–950)
Monocytes Relative: 6 %
Neutro Abs: 4891 {cells}/uL (ref 1500–7800)
Neutrophils Relative %: 73 %
Platelets: 301 K/uL (ref 140–400)
RBC: 3.37 MIL/uL — ABNORMAL LOW (ref 3.80–5.10)
RDW: 16.8 % — ABNORMAL HIGH (ref 11.0–15.0)
WBC: 6.7 K/uL (ref 3.8–10.8)

## 2016-01-02 NOTE — Assessment & Plan Note (Signed)
Diet controlled, recheck A1C Recheck renal function now back on baseline diuretics Blood pressure well controlled SHe will be turning 89 on Sunday! Overall doing well

## 2016-01-02 NOTE — Progress Notes (Signed)
   Subjective:    Patient ID: [name], female    DOB: 08/12/1923, 82 y.o.   MRN: [id number]  Patient presents for Follow-up (is fasting) Patient follow-up chronic medical problems. She has history of diabetes mellitus which is now diet controlled hypertension hyperlipidemia she is doing fairly well for her age of 82. Her children care for her He was having problems with her acid reflux the last visit I advised her to take Zantac at bedtime in her pantoprazole in the morning She still followed by cardiology reviewed the note from June she had some mild volume overload they treated her with Lasix 40 mg for 4 days then decrease her back to 20 mg daily with addition of potassium She did have an increase in her renal function with the change in Lasix creatinine went up to 1.95 BUN 41 this was the first week of July She is doing well, no concerns  Review Of Systems:  GEN- denies fatigue, fever, weight loss,weakness, recent illness HEENT- denies eye drainage, change in vision, nasal discharge, CVS- denies chest pain, palpitations RESP- denies SOB, cough, wheeze ABD- denies N/V, change in stools, abd pain GU- denies dysuria, hematuria, dribbling, incontinence MSK- + joint pain, muscle aches, injury Neuro- denies headache, dizziness, syncope, seizure activity       Objective:    BP 136/62 (BP Location: Right Arm, Patient Position: Sitting, Cuff Size: Normal)   Pulse 62   Temp 97.9 F (36.6 C) (Oral)   Resp 16   Ht 5\' 3"  (1.6 m)   Wt 181 lb (82.1 kg)   BMI 32.06 kg/m  GEN- NAD, alert and oriented x3 HEENT- PERRL, EOMI, non injected sclera, pink conjunctiva, MMM, oropharynx clear Neck- Supple, CVS- RRR, no murmur RESP-CTAB ABD-NABS,soft,NT,ND EXT- minimal pedal  edema Pulses- Radial, DP- 2+        Assessment & Plan:      Problem List Items Addressed This Visit    Hyperlipidemia - Primary   Relevant Orders   Lipid panel   Essential hypertension   Relevant Orders   CBC with Differential/Platelet   Comprehensive metabolic panel   Diabetes mellitus, type II ([place])    Diet controlled, recheck A1C Recheck renal function now back on baseline diuretics Blood pressure well controlled SHe will be turning 82 on Sunday! Overall doing well       Relevant Orders   Hemoglobin A1c   CKD (chronic kidney disease), stage III    Other Visit Diagnoses   None.     Note: This dictation was prepared with Dragon dictation along with smaller phrase technology. Any transcriptional errors that result from this process are unintentional.

## 2016-01-03 LAB — HEMOGLOBIN A1C
Hgb A1c MFr Bld: 5.6 % (ref <5.7)
Mean Plasma Glucose: 114 mg/dL

## 2016-01-03 LAB — LIPID PANEL
CHOL/HDL RATIO: 3 ratio (ref <=5.0)
Cholesterol: 155 mg/dL (ref 125–200)
HDL: 51 mg/dL (ref >=46)
LDL Cholesterol: 75 mg/dL (ref <130)
Triglycerides: 144 mg/dL (ref <150)
VLDL: 29 mg/dL (ref <30)

## 2016-01-03 LAB — COMPREHENSIVE METABOLIC PANEL
ALT: 6 U/L (ref 6–29)
AST: 13 U/L (ref 10–35)
Albumin: 4.3 g/dL (ref 3.6–5.1)
Alkaline Phosphatase: 150 U/L — ABNORMAL HIGH (ref 33–130)
BUN: 41 mg/dL — AB (ref 7–25)
CHLORIDE: 108 mmol/L (ref 98–110)
CO2: 21 mmol/L (ref 20–31)
Calcium: 9.7 mg/dL (ref 8.6–10.4)
Creat: 1.84 mg/dL — ABNORMAL HIGH (ref 0.60–0.88)
GLUCOSE: 107 mg/dL — AB (ref 70–99)
POTASSIUM: 5.2 mmol/L (ref 3.5–5.3)
Sodium: 142 mmol/L (ref 135–146)
Total Bilirubin: 0.4 mg/dL (ref 0.2–1.2)
Total Protein: 7.2 g/dL (ref 6.1–8.1)

## 2016-01-07 ENCOUNTER — Other Ambulatory Visit: Payer: Self-pay | Admitting: Family Medicine

## 2016-01-07 NOTE — Telephone Encounter (Signed)
Refill appropriate and filled per protocol. 

## 2016-01-10 ENCOUNTER — Encounter: Payer: Self-pay | Admitting: *Deleted

## 2016-02-05 ENCOUNTER — Other Ambulatory Visit: Payer: Self-pay | Admitting: Family Medicine

## 2016-02-06 ENCOUNTER — Other Ambulatory Visit: Payer: Self-pay | Admitting: Pharmacist

## 2016-02-06 NOTE — Telephone Encounter (Signed)
Medication refilled per protocol. 

## 2016-02-06 NOTE — Patient Outreach (Signed)
Outreach call to [name] regarding her request for follow up from the [facility] Medication Adherence Campaign. HIPAA identifiers verified and verbal consent received.   Patient reports that she has been taking her pravastatin as directed. Denies any missed doses or any barriers to taking his medications such as cost or side effects.   Patient reports that she has no medication questions or concerns at this time. Provide patient with my phone number.  Harlow Asa, PharmD Clinical Pharmacist [facility] [phone]

## 2016-02-14 ENCOUNTER — Other Ambulatory Visit: Payer: Self-pay | Admitting: Family Medicine

## 2016-02-15 ENCOUNTER — Ambulatory Visit: Payer: Medicare Other | Admitting: Interventional Cardiology

## 2016-02-15 DIAGNOSIS — Z961 Presence of intraocular lens: Secondary | ICD-10-CM | POA: Diagnosis not present

## 2016-02-15 DIAGNOSIS — H401113 Primary open-angle glaucoma, right eye, severe stage: Secondary | ICD-10-CM | POA: Diagnosis not present

## 2016-02-15 DIAGNOSIS — H40011 Open angle with borderline findings, low risk, right eye: Secondary | ICD-10-CM | POA: Diagnosis not present

## 2016-03-25 ENCOUNTER — Other Ambulatory Visit: Payer: Self-pay | Admitting: Family Medicine

## 2016-04-10 ENCOUNTER — Ambulatory Visit (INDEPENDENT_AMBULATORY_CARE_PROVIDER_SITE_OTHER): Payer: Medicare Other | Admitting: Physician Assistant

## 2016-04-10 VITALS — BP 118/60 | HR 62 | Temp 97.9°F | Resp 16 | Wt 161.0 lb

## 2016-04-10 DIAGNOSIS — M150 Primary generalized (osteo)arthritis: Secondary | ICD-10-CM

## 2016-04-10 DIAGNOSIS — R52 Pain, unspecified: Secondary | ICD-10-CM

## 2016-04-10 DIAGNOSIS — M159 Polyosteoarthritis, unspecified: Secondary | ICD-10-CM

## 2016-04-10 MED ORDER — PREDNISONE 20 MG PO TABS: ORAL_TABLET | ORAL | 0 refills | Status: DC

## 2016-04-11 NOTE — Progress Notes (Signed)
Patient ID: OTHA RICKLES MRN: [id number], DOB: December 10, 1923, 82 y.o. Date of Encounter: 04/11/2016, 12:13 PM    Chief Complaint:  Chief Complaint  Patient presents with  . Generalized Body Aches    sore x 2-3 days     HPI: 82 y.o. year old female here with her daughter.   They report that this past Tuesday (day before yesterday)  pt sat outside in the cold while they were making brun[place] stew. (It was ~ 35-45 degrees). Sat out there 8am - 11am. When she was out there, she was c/o feeling cold. Says she has arthritis. Says she has been more sore and achy than usual. Says "I thought you could give me a shot of penicillin or something".  No other concerns to address today.     Home Meds:   Outpatient Medications Prior to Visit  Medication Sig Dispense Refill  . acetaminophen (TYLENOL) 500 MG tablet Take 1,500 mg by mouth every 6 (six) hours as needed for mild pain.    Marland Kitchen allopurinol (ZYLOPRIM) 300 MG tablet Take 1 tablet by mouth  daily with breakfast 90 tablet 3  . amLODipine (NORVASC) 5 MG tablet Take 1 tablet by mouth  daily 90 tablet 3  . aspirin EC 81 MG tablet Take 81 mg by mouth daily.    . dorzolamide-timolol (COSOPT) 22.3-6.8 MG/ML ophthalmic solution Place 1 drop into the right eye 2 (two) times daily.    . furosemide (LASIX) 40 MG tablet Take 1 tablet by mouth daily starting today X's 4 days then take 1/2 tablet by mouth daily 90 tablet 3  . gabapentin (NEURONTIN) 100 MG capsule Take 1 capsule (100 mg total) by mouth at bedtime. 90 capsule 3  . glipiZIDE (GLUCOTROL) 5 MG tablet Take 1 tablet by mouth  daily before breakfast 90 tablet 3  . hydrALAZINE (APRESOLINE) 10 MG tablet TAKE 1 TABLET BY MOUTH TWO  TIMES DAILY 180 tablet 1  . latanoprost (XALATAN) 0.005 % ophthalmic solution Place 1 drop into the right eye 2 (two) times daily.     Marland Kitchen lisinopril (PRINIVIL,ZESTRIL) 40 MG tablet Take 1 tablet by mouth  every morning 90 tablet 3  . meloxicam (MOBIC) 15 MG tablet Take 1  tablet by mouth  daily 90 tablet 3  . pantoprazole (PROTONIX) 40 MG tablet Take 1 tablet by mouth  daily 90 tablet 3  . potassium chloride SA (K-DUR,KLOR-CON) 20 MEQ tablet Take 1 tablet (20 mEq total) by mouth daily. 90 tablet 3  . pravastatin (PRAVACHOL) 20 MG tablet TAKE 1 TABLET BY MOUTH  EVERY EVENING 90 tablet 1  . ranitidine (ZANTAC) 150 MG tablet Take 1 tablet by mouth at  bedtime 90 tablet 3  . timolol (BETIMOL) 0.5 % ophthalmic solution Place 1 drop into the left eye daily.     No facility-administered medications prior to visit.     Allergies: No Known Allergies    Review of Systems: See HPI for pertinent ROS. All other ROS negative.    Physical Exam: Blood pressure 118/60, pulse 62, temperature 97.9 F (36.6 C), temperature source Oral, resp. rate 16, weight 161 lb (73 kg), SpO2 98 %., Body mass index is 28.52 kg/m. General: WNWD AAF.  Appears in no acute distress. Neck: Supple. No thyromegaly. No lymphadenopathy. Lungs: Clear bilaterally to auscultation without wheezes, rales, or rhonchi. Breathing is unlabored. Heart: Regular rhythm. No murmurs, rubs, or gallops. Msk:  Strength and tone normal for age. Extremities/Skin: Warm and dry.  Neuro: Alert and oriented X 3. Moves all extremities spontaneously. Gait is normal. CNII-XII grossly in tact. Psych:  Responds to questions appropriately with a normal affect.     ASSESSMENT AND PLAN:  82 y.o. year old female with  1. Diffuse pain Recent Creatinines have been 1.84, 1.95. Avoid NSAIDS. Avoid meds that will increase fall risk. Will use Prednisone taper. F/U PRN - predniSONE (DELTASONE) 20 MG tablet; Take 3 daily for 2 days, then 2 daily for 2 days, then 1 daily for 2 days.  Dispense: 12 tablet; Refill: 0  2. Primary osteoarthritis involving multiple joints  - predniSONE (DELTASONE) 20 MG tablet; Take 3 daily for 2 days, then 2 daily for 2 days, then 1 daily for 2 days.  Dispense: 12 tablet; Refill:  0   Signed, [address] [place], [place], [facility] 04/11/2016 12:13 PM

## 2016-04-21 ENCOUNTER — Telehealth: Payer: Self-pay | Admitting: Family Medicine

## 2016-04-21 NOTE — Telephone Encounter (Signed)
Call placed to patient.   Reports that her FSBS fasting ranged from 140- 160 while on prednisone.   States that she has completed prednisone and numbers are returning to normal.   Advised to continue to monitor FSBS.

## 2016-04-21 NOTE — Telephone Encounter (Signed)
Patient calling to talk about prednisone issues and also sugar being high  [phone]

## 2016-04-24 NOTE — Telephone Encounter (Signed)
Patient called office.   Reports that FSBS have been as follows: 11/28  139 11/29  143 11/30  146  Advised to continue to monitor.

## 2016-04-24 NOTE — Telephone Encounter (Signed)
Noted ,agree with monitoring blood sugars look good

## 2016-05-01 ENCOUNTER — Telehealth: Payer: Self-pay | Admitting: *Deleted

## 2016-05-01 MED ORDER — FUROSEMIDE 40 MG PO TABS: ORAL_TABLET | ORAL | 3 refills | Status: DC

## 2016-05-01 NOTE — Telephone Encounter (Signed)
Received fax requesting refill on Lasix.   Refill appropriate and filled per protocol. 

## 2016-07-02 ENCOUNTER — Encounter: Payer: Self-pay | Admitting: Family Medicine

## 2016-07-02 ENCOUNTER — Ambulatory Visit (INDEPENDENT_AMBULATORY_CARE_PROVIDER_SITE_OTHER): Payer: Medicare Other | Admitting: Family Medicine

## 2016-07-02 VITALS — BP 128/78 | HR 68 | Temp 97.6°F | Resp 14 | Ht 63.0 in | Wt 182.0 lb

## 2016-07-02 DIAGNOSIS — K219 Gastro-esophageal reflux disease without esophagitis: Secondary | ICD-10-CM

## 2016-07-02 DIAGNOSIS — N183 Chronic kidney disease, stage 3 unspecified: Secondary | ICD-10-CM

## 2016-07-02 DIAGNOSIS — R609 Edema, unspecified: Secondary | ICD-10-CM | POA: Diagnosis not present

## 2016-07-02 DIAGNOSIS — R1310 Dysphagia, unspecified: Secondary | ICD-10-CM | POA: Diagnosis not present

## 2016-07-02 DIAGNOSIS — I10 Essential (primary) hypertension: Secondary | ICD-10-CM

## 2016-07-02 DIAGNOSIS — I5032 Chronic diastolic (congestive) heart failure: Secondary | ICD-10-CM

## 2016-07-02 LAB — CBC WITH DIFFERENTIAL/PLATELET
BASOS ABS: 0 {cells}/uL (ref 0–200)
Basophils Relative: 0 %
EOS ABS: 296 {cells}/uL (ref 15–500)
Eosinophils Relative: 4 %
HEMATOCRIT: 32.9 % — AB (ref 35.0–45.0)
Hemoglobin: 10 g/dL — ABNORMAL LOW (ref 12.0–15.0)
LYMPHS PCT: 22 %
Lymphs Abs: 1628 cells/uL (ref 850–3900)
MCH: 28.5 pg (ref 27.0–33.0)
MCHC: 30.4 g/dL — AB (ref 32.0–36.0)
MCV: 93.7 fL (ref 80.0–100.0)
MONO ABS: 444 {cells}/uL (ref 200–950)
MONOS PCT: 6 %
MPV: 9.4 fL (ref 7.5–12.5)
NEUTROS PCT: 68 %
Neutro Abs: 5032 cells/uL (ref 1500–7800)
PLATELETS: 261 10*3/uL (ref 140–400)
RBC: 3.51 MIL/uL — ABNORMAL LOW (ref 3.80–5.10)
RDW: 17.5 % — AB (ref 11.0–15.0)
WBC: 7.4 10*3/uL (ref 3.8–10.8)

## 2016-07-02 LAB — COMPREHENSIVE METABOLIC PANEL
ALK PHOS: 154 U/L — AB (ref 33–130)
ALT: 5 U/L — ABNORMAL LOW (ref 6–29)
AST: 12 U/L (ref 10–35)
Albumin: 4.3 g/dL (ref 3.6–5.1)
BUN: 38 mg/dL — AB (ref 7–25)
CALCIUM: 10.2 mg/dL (ref 8.6–10.4)
CHLORIDE: 110 mmol/L (ref 98–110)
CO2: 22 mmol/L (ref 20–31)
Creat: 2.17 mg/dL — ABNORMAL HIGH (ref 0.60–0.88)
GLUCOSE: 118 mg/dL — AB (ref 70–99)
POTASSIUM: 5.4 mmol/L — AB (ref 3.5–5.3)
Sodium: 144 mmol/L (ref 135–146)
Total Bilirubin: 0.4 mg/dL (ref 0.2–1.2)
Total Protein: 7.1 g/dL (ref 6.1–8.1)

## 2016-07-02 NOTE — Assessment & Plan Note (Addendum)
Reflux with some mild dysphagia symptoms, will get set up with GI, feels foods getting stuck primarly  Given nexium sample to try instead of protonix

## 2016-07-02 NOTE — Progress Notes (Signed)
   Subjective:    Patient ID: [name], female    DOB: 10/14/1923, 83 y.o.   MRN: [id number]  Patient presents for SOB with exertion (x months- sortness of breath with exertion, edema to ankles) and Indigestion (heartburn, reflux, states that she only eats small portions and feels full)  Here with daughter today   Pt here with with SOB and swelling in ankles past few months, she has chronic diastolic dysfunction, currently on lasix 20mg  once a day . She gets SOB after just walking to bathroom, can not walk around grocery store without sitting. Denies chest pain. She has concurrent CKD, last Creatning was 1.84 Last weight 181 in august, Nov 161? If this was correct, today 182lbs which is her baselne    Continues to have reflux, at visit in august advised protonix in the morning and zantac at bedtime, she still gets some heartburn but also feels like her food does not go all the way down. Denies any vomiting, no change in stools     Review Of Systems:  GEN- denies fatigue, fever, weight loss,weakness, recent illness HEENT- denies eye drainage, change in vision, nasal discharge, CVS- denies chest pain, palpitations RESP- + SOB, cough, wheeze ABD- denies N/V, change in stools, abd pain GU- denies dysuria, hematuria, dribbling, incontinence MSK-+ joint pain, muscle aches, injury Neuro- denies headache, dizziness, syncope, seizure activity       Objective:    BP 128/78   Pulse 68   Temp 97.6 F (36.4 C) (Oral)   Resp 14   Ht 5\' 3"  (1.6 m)   Wt 182 lb (82.6 kg)   SpO2 98%   BMI 32.24 kg/m  GEN- NAD, alert and oriented x3 HEENT- PERRL, EOMI, non injected sclera, pink conjunctiva, MMM, oropharynx clear Neck- Supple, no JVD  CVS- RRR, few PVC  no murmur RESP-CTAB ABD-NABS,soft,NT,ND EXT- 1+ pitting edema to shins, ballooning near feet  Pulses- Radial 2+   EKG- NSR , No ST change , unchanged from previous except previous had PAC in June 2017     Assessment & Plan:       Problem List Items Addressed This Visit    Peripheral edema    Compression hose ordered       GERD (gastroesophageal reflux disease)    Reflux with some mild dysphagia symptoms, will get set up with GI, feels foods getting stuck primarly  Given nexium sample to try instead of protonix      Relevant Orders   Ambulatory referral to Gastroenterology   Essential hypertension - Primary    controlled      Relevant Orders   EKG 12-Lead (Completed)   CBC with Differential/Platelet   Comprehensive metabolic panel   CKD (chronic kidney disease), stage III   Chronic diastolic CHF (congestive heart failure) (HCC)    Increase lasix to 40mg  once a day for next 3 days, she has CKD and with her age, will quickly dehydrate Will also give compression hose Recheck legs on Friday        Relevant Orders   Brain natriuretic peptide    Other Visit Diagnoses    Dysphagia, unspecified type       Relevant Orders   Ambulatory referral to Gastroenterology      Note: This dictation was prepared with Dragon dictation along with smaller phrase technology. Any transcriptional errors that result from this process are unintentional.

## 2016-07-02 NOTE — Assessment & Plan Note (Signed)
Increase lasix to 40mg  once a day for next 3 days, she has CKD and with her age, will quickly dehydrate Will also give compression hose Recheck legs on Friday

## 2016-07-02 NOTE — Patient Instructions (Addendum)
Take 1 full tablet of lasix daily for next 3 days, with potassium Keep drinking with this  We will call with lab results  Try the nexium in the mornin ( STOP PROTONIX) Keep taking zantac at bedtime  Referral to stomach doctor F/U Friday for recheck

## 2016-07-02 NOTE — Assessment & Plan Note (Signed)
Compression hose ordered

## 2016-07-02 NOTE — Assessment & Plan Note (Signed)
controlled 

## 2016-07-03 LAB — BRAIN NATRIURETIC PEPTIDE: BRAIN NATRIURETIC PEPTIDE: 114.3 pg/mL — AB (ref <100)

## 2016-07-04 ENCOUNTER — Ambulatory Visit (INDEPENDENT_AMBULATORY_CARE_PROVIDER_SITE_OTHER): Payer: Medicare Other | Admitting: Family Medicine

## 2016-07-04 ENCOUNTER — Encounter: Payer: Self-pay | Admitting: Family Medicine

## 2016-07-04 VITALS — BP 136/70 | HR 78 | Temp 98.5°F | Resp 14 | Ht 63.0 in | Wt 180.0 lb

## 2016-07-04 DIAGNOSIS — R609 Edema, unspecified: Secondary | ICD-10-CM | POA: Diagnosis not present

## 2016-07-04 DIAGNOSIS — I5032 Chronic diastolic (congestive) heart failure: Secondary | ICD-10-CM

## 2016-07-04 DIAGNOSIS — N183 Chronic kidney disease, stage 3 unspecified: Secondary | ICD-10-CM

## 2016-07-04 LAB — BASIC METABOLIC PANEL
BUN: 37 mg/dL — ABNORMAL HIGH (ref 7–25)
CALCIUM: 10 mg/dL (ref 8.6–10.4)
CO2: 23 mmol/L (ref 20–31)
CREATININE: 1.87 mg/dL — AB (ref 0.60–0.88)
Chloride: 110 mmol/L (ref 98–110)
Glucose, Bld: 133 mg/dL — ABNORMAL HIGH (ref 70–99)
Potassium: 5.3 mmol/L (ref 3.5–5.3)
SODIUM: 143 mmol/L (ref 135–146)

## 2016-07-04 MED ORDER — DICLOFENAC SODIUM 2 % TD SOLN: TRANSDERMAL | 1 refills | Status: DC

## 2016-07-04 MED ORDER — ESOMEPRAZOLE MAGNESIUM 40 MG PO CPDR: 40.0000 mg | DELAYED_RELEASE_CAPSULE | Freq: Every day | ORAL | 2 refills | Status: DC

## 2016-07-04 NOTE — Patient Instructions (Signed)
F/u 4 MONTHS  

## 2016-07-04 NOTE — Progress Notes (Signed)
   Subjective:    Patient ID: [name], female    DOB: 1923-11-20, 83 y.o.   MRN: [id number]  Patient presents for Follow-up  Issue here for interim follow-up on her peripheral edema shortness of breath she has chronic heart failure. I increase her Lasix to 40 mg once a day for 3 days. Labs were done and she has chronic kidney disease her creatinine typically is around 1.8-1.9 her creatinine returned at 2.1 with a potassium of 5.4. BNP was also elevated at 114. He was also prescribed compression hose Wt 182lbs , down 2lbs today    She states that she feels much better she has been urinating a lot. She is not her had her Lasix for today. She is using the compression hose she found a pair at home. She does not have any new concerns today.  Review Of Systems:  GEN- denies fatigue, fever, weight loss,weakness, recent illness HEENT- denies eye drainage, change in vision, nasal discharge, CVS- denies chest pain, palpitations RESP- denies SOB, cough, wheeze ABD- denies N/V, change in stools, abd pain GU- denies dysuria, hematuria, dribbling, incontinence MSK- denies joint pain, muscle aches, injury Neuro- denies headache, dizziness, syncope, seizure activity       Objective:    BP 136/70   Pulse 78   Temp 98.5 F (36.9 C) (Oral)   Resp 14   Ht 5\' 3"  (1.6 m)   Wt 876 lb (81.6 kg)   SpO2 97%   BMI 31.89 kg/m  GEN- NAD, alert and oriented x3 HEENT- PERRL, EOMI, non injected sclera, pink conjunctiva, MMM, oropharynx clear Neck- Supple, no JVD  CVS- RRR, no murmur RESP-CTAB EXT-  mild pedal edemaPulses- Radial 2+ , DP palpated         Assessment & Plan:      Problem List Items Addressed This Visit    Peripheral edema   CKD (chronic kidney disease), stage III - Primary   Relevant Orders   Basic metabolic panel   Chronic diastolic CHF (congestive heart failure) (HCC)    Her symptoms are improved. She did respond well to the Lasix however well eats it check her  creatinine again. We'll plan to decrease her back to her 20 mg. If her potassium is still high we'll also have her take half a tablet of the potassium which will be 10 mEq. She will continue with the compression hose. This is a fine line treating her health. In her kidney disease. I discussed this with the family.      Relevant Orders   Basic metabolic panel      Note: This dictation was prepared with Dragon dictation along with smaller phrase technology. Any transcriptional errors that result from this process are unintentional.

## 2016-07-04 NOTE — Assessment & Plan Note (Signed)
Her symptoms are improved. She did respond well to the Lasix however well eats it check her creatinine again. We'll plan to decrease her back to her 20 mg. If her potassium is still high we'll also have her take half a tablet of the potassium which will be 10 mEq. She will continue with the compression hose. This is a fine line treating her health. In her kidney disease. I discussed this with the family.

## 2016-07-07 ENCOUNTER — Encounter: Payer: Self-pay | Admitting: Internal Medicine

## 2016-07-15 ENCOUNTER — Telehealth: Payer: Self-pay

## 2016-07-15 NOTE — Telephone Encounter (Signed)
[name] called and stated her mother [name] is has broken out under her stomach, [name] states  It  looks like whips and patient is itching rash is near low abdomen area. Patient is not sure how long the rash has been there she just noticed it. Patients also states it does not hurt just itches really bad.  Explained to [name]  that [name] can use hydrocortisone cream to help with  Itching/rash.Appt made for Wed for pt to be seen

## 2016-07-16 ENCOUNTER — Ambulatory Visit: Payer: Self-pay | Admitting: Family Medicine

## 2016-07-18 ENCOUNTER — Ambulatory Visit: Payer: Self-pay | Admitting: Family Medicine

## 2016-07-28 ENCOUNTER — Ambulatory Visit (INDEPENDENT_AMBULATORY_CARE_PROVIDER_SITE_OTHER): Payer: Medicare Other | Admitting: Nurse Practitioner

## 2016-07-28 ENCOUNTER — Encounter: Payer: Self-pay | Admitting: Nurse Practitioner

## 2016-07-28 VITALS — BP 144/67 | HR 80 | Temp 97.8°F | Ht 63.5 in | Wt 178.6 lb

## 2016-07-28 DIAGNOSIS — R1310 Dysphagia, unspecified: Secondary | ICD-10-CM | POA: Diagnosis not present

## 2016-07-28 DIAGNOSIS — K219 Gastro-esophageal reflux disease without esophagitis: Secondary | ICD-10-CM | POA: Diagnosis not present

## 2016-07-28 NOTE — Assessment & Plan Note (Signed)
Dysphagia which started about 78 months ago. Since being on Nexium her symptoms have significantly improved. She is likely higher risk for endoscopic evaluation because of her comorbidities, including chronic heart failure. I discussed options with her and this time she and her daughter are preferring barium pill esophagram to evaluate for stricture/narrowing versus age-related dysmotility. Continue Nexium and Zantac, we will schedule the diagnostic upper GI, return for follow-up in 2 months.

## 2016-07-28 NOTE — Patient Instructions (Signed)
Attempted submit PA info via [facility] website. No PA needed.

## 2016-07-28 NOTE — Progress Notes (Signed)
Primary Care Physician:  Vic Blackbird, MD Primary Gastroenterologist:  Dr. Gala Romney  Chief Complaint  Patient presents with  . Dysphagia    HPI:   [name] is a 83 y.o. female who presents on referral from primary care for reflux and dysphagia. PCP notes reviewed. Last saw primary care for these issues on 07/02/2016 which noted continued reflux despite Protonix in the morning and Zantac at night, also solid food dysphagia. She was given Nexium to try instead of Protonix.  She does have a history of CHF and chronic kidney disease and CHF. Her Lasix was recently titrated but her creatinine came back slightly more elevated than her baseline at 2.1 (baseline 1.7-1.9). Although it did help her CHF symptoms.  Today she states she's doing well. Her reflux is doing better on Nexium (compared to Protonix). She's had chronic dysphagia, about 7-8 months. Dysphagia is significantly improved on nexium. Has symptoms "every once in a long while, not often." Denies abdominal pain, N/V, hematochezia, melena. Denies chest pain, dyspnea, dizziness, lightheadedness, syncope, near syncope. Denies any other upper or lower GI symptoms.  Past Medical History:  Diagnosis Date  . Arthritis   . Bilateral carpal tunnel syndrome   . Cataracts, both eyes   . Chronic diastolic CHF (congestive heart failure) ([place])    a. Echo (09/2012): Mild LVH, EF 65-70%, moderately elevated RVSP, moderate TR, mild to moderate MR, mild LAE, grade 2 diastolic dysfunction  . Diabetes mellitus [facility]  . Diabetic neuropathy (HCC) both hands and legs  . GERD (gastroesophageal reflux disease)   . Glaucoma   . Gout   . History of falling RECENT FALL 1 WK AGO--   NO INJURY  . 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 History of transfusion   . Hyperlipidemia   . Hypertension   . Mitral regurgitation   . PUD (peptic ulcer disease)    many yrs ago  . Right leg weakness   . Rotator cuff tear    left  .  Slipped intervertebral disc   . Walker as ambulation aid     Past Surgical History:  Procedure Laterality Date  . ABDOMINAL HYSTERECTOMY  1966  . APPENDECTOMY    . CARDIOVASCULAR STRESS TEST  06-18-2005   DR Daneen Schick   NORMAL STUDY/ NO EVIDENCE ISCHEMIA/ EF 75%  . CARPAL TUNNEL RELEASE  09/17/2011   Procedure: CARPAL TUNNEL RELEASE;  Surgeon: Magnus Sinning, MD;  Location: [place] [facility];  Service: Orthopedics;  Laterality: Right;  . CARPAL TUNNEL RELEASE  10/22/2011   Procedure: CARPAL TUNNEL RELEASE;  Surgeon: Magnus Sinning, MD;  Location: [place] [facility];  Service: Orthopedics;  Laterality: Left;  . EYE SURGERY    . LUMBAR LAMINECTOMY/DECOMPRESSION MICRODISCECTOMY  01/14/2012   Procedure: LUMBAR LAMINECTOMY/DECOMPRESSION MICRODISCECTOMY 3 LEVELS;  Surgeon: Magnus Sinning, MD;  Location: [facility];  Service: Orthopedics;  Laterality: N/A;  Decompression Laminectomy of L2 - L3, L3 - L4 and L4 - L5 Central  (X-Ray)  . SHOULDER OPEN ROTATOR CUFF REPAIR  06/02/2012   Procedure: ROTATOR CUFF REPAIR SHOULDER OPEN;  Surgeon: Magnus Sinning, MD;  Location: [facility];  Service: Orthopedics;  Laterality: Left;  Left Shoulder Open Distal Clavicle Resection Anterior Acrominectomy and Rotator Cuff Repair  . SPINE SURGERY    . TRANSTHORACIC ECHOCARDIOGRAM  12-26-2008  DR Daneen Schick   NORMAL LVF/  EF  71%/   MILD  ASYMMETRIC SEPTAL HYPERTROPHY/ MILD LEFT ATRIAL ENLARGEMENT/ MODERATELY ELEVATED ESTIMATED RIGHT VENTRICULAR SYSTOLIC PRESSURE/ MILD MITRAL  &  TRICUSPID VALVE REGURG.    Current Outpatient Prescriptions  Medication Sig Dispense Refill  . acetaminophen (TYLENOL) 500 MG tablet Take 1,500 mg by mouth every 6 (six) hours as needed for mild pain.    Marland Kitchen allopurinol (ZYLOPRIM) 300 MG tablet Take 1 tablet by mouth  daily with breakfast 90 tablet 3  . amLODipine (NORVASC) 5 MG tablet Take 1 tablet by mouth  daily 90 tablet 3  . aspirin EC 81 MG tablet Take 81 mg by mouth  daily.    . Diclofenac Sodium (PENNSAID) 2 % SOLN Apply three times as a day as as needed for joint pain 1 Bottle 1  . dorzolamide-timolol (COSOPT) 22.3-6.8 MG/ML ophthalmic solution Place 1 drop into the right eye 2 (two) times daily.    Marland Kitchen esomeprazole (NEXIUM) 40 MG capsule Take 1 capsule (40 mg total) by mouth daily. 90 capsule 2  . furosemide (LASIX) 40 MG tablet 1/2 tablet by mouth daily 45 tablet 3  . gabapentin (NEURONTIN) 100 MG capsule Take 1 capsule (100 mg total) by mouth at bedtime. 90 capsule 3  . hydrALAZINE (APRESOLINE) 10 MG tablet TAKE 1 TABLET BY MOUTH TWO  TIMES DAILY 180 tablet 1  . latanoprost (XALATAN) 0.005 % ophthalmic solution Place 1 drop into the right eye 2 (two) times daily.     Marland Kitchen lisinopril (PRINIVIL,ZESTRIL) 40 MG tablet Take 1 tablet by mouth  every morning 90 tablet 3  . potassium chloride SA (K-DUR,KLOR-CON) 20 MEQ tablet Take 1 tablet (20 mEq total) by mouth daily. 90 tablet 3  . pravastatin (PRAVACHOL) 20 MG tablet TAKE 1 TABLET BY MOUTH  EVERY EVENING 90 tablet 1  . ranitidine (ZANTAC) 150 MG tablet Take 1 tablet by mouth at  bedtime 90 tablet 3  . timolol (BETIMOL) 0.5 % ophthalmic solution Place 1 drop into the left eye daily.     No current facility-administered medications for this visit.     Allergies as of 07/28/2016  . (No Known Allergies)    Family History  Problem Relation Age of Onset  . Hypertension Father   . Hypertension Mother   . Stroke Mother   . Diabetes Sister   . Hypertension Sister   . Cancer Sister   . Arthritis Sister   . Hypertension Brother   . Alcohol abuse Brother   . Hypertension Sister   . Arthritis Sister   . Hypertension Sister   . Arthritis Sister   . Arthritis Sister   . Arthritis Sister   . Heart attack Son   . Colon cancer Neg Hx     Social History   Social History  . Marital status: Widowed    Spouse name: N/A  . Number of children: N/A  . Years of education: N/A   Occupational History  . Not on  file.   Social History Main Topics  . Smoking status: Former Smoker    Packs/day: 1.00    Years: 30.00    Types: Cigarettes    Quit date: 05/28/1968  . Smokeless tobacco: Never Used  . Alcohol use No  . Drug use: No  . Sexual activity: Not on file   Other Topics Concern  . Not on file   Social History Narrative  . No narrative on file    Review of Systems: General: Negative for anorexia, weight loss, fever, chills, fatigue, weakness. ENT: Negative for  hoarseness, difficulty swallowing. CV: Negative for chest pain, angina, palpitations, peripheral edema.  Respiratory: Negative for dyspnea at rest, cough, sputum, wheezing.  GI: See history of present illness. MS: Admits chronic knee pain.  Derm: Negative for rash or itching.  Endo: Negative for unusual weight change.  Heme: Negative for bruising or bleeding. Allergy: Negative for rash or hives.    Physical Exam: BP (!) 144/67   Pulse 80   Temp 97.8 F (36.6 C) (Oral)   Ht 5' 3.5" (1.613 m)   Wt 178 lb 9.6 oz (81 kg)   BMI 31.14 kg/m  General:   Alert and oriented. Pleasant and cooperative. Well-nourished and well-developed.  Head:  Normocephalic and atraumatic. Eyes:  Without icterus, sclera clear and conjunctiva pink.  Ears:  Normal auditory acuity. Cardiovascular:  S1, S2 present without murmurs appreciated. Extremities without clubbing or edema. Respiratory:  Clear to auscultation bilaterally. No wheezes, rales, or rhonchi. No distress.  Gastrointestinal:  +BS, soft, non-tender and non-distended. No HSM noted. No guarding or rebound. No masses appreciated.  Rectal:  Deferred  Musculoskalatal:  Symmetrical without gross deformities. Ambulates with a walker. Neurologic:  Alert and oriented x4;  grossly normal neurologically. Psych:  Alert and cooperative. Normal mood and affect. Heme/Lymph/Immune: No excessive bruising noted.    07/28/2016 10:00 AM   Disclaimer: This note was dictated with voice recognition  software. Similar sounding words can inadvertently be transcribed and may not be corrected upon review.

## 2016-07-28 NOTE — Progress Notes (Signed)
Cc'ed to pcp °

## 2016-07-28 NOTE — Patient Instructions (Addendum)
1. Continue taking Nexium and Zantac as you have been. 2. Our office will help you schedule the x-ray test to evaluate your swallowing. 3. If you continue to have swallowing problems, follow the instructions below for foods that are less likely to cause a problem. 4. Return for follow-up in 2 months.    Dysphagia Diet Level 3, Mechanically Advanced The dysphagia level 3 diet includes foods that are soft, moist, and can be chopped into 1-inch chunks. This diet is helpful for people with mild swallowing difficulties. It reduces the risk of food getting caught in the windpipe, trachea, or lungs. What do I need to know about this diet?  You may eat foods that are soft and moist.  If you were on the dysphagia level 1 or level 2 diets, you may eat any of the foods included on those lists.  Avoid foods that are dry, hard, sticky, chewy, coarse, and crunchy. Also avoid large cuts of food.  Take small bites. Each bite should contain 1 inch or less of food.  Thicken liquids if instructed by your health care provider. Follow your health care provider's instructions on how to do this and to what consistency.  See your dietitian or speech language pathologist regularly for help with your dietary changes. What foods can I eat? Grains  Moist breads without nuts or seeds. Biscuits, muffins, pancakes, and waffles well-moistened with syrup, jelly, margarine, or butter. Smooth cereals with plenty of milk to moisten them. Moist bread stuffing. Moist rice. Vegetables  All cooked, soft vegetables. Shredded lettuce. Tender fried potatoes. Fruits  All canned and cooked fruits. Soft, peeled fresh fruits, such as peaches, nectarines, kiwis, cantaloupe, honeydew melon, and watermelon without seeds. Soft berries, such as strawberries. Meat and Other Protein Sources  Moist ground or finely diced or sliced meats. Solid, tender cuts of meat. Meatloaf. Hamburger with a bun. Sausage patty. Deli thin-sliced lunch meat.  Chicken, egg, or tuna salad sandwich. Sloppy joe. Moist fish. Eggs prepared any way. Casseroles with small chunks of meats, ground meats, or tender meats. Dairy  Cheese spreads without coarse large chunks. Shredded cheese. Cheese slices. Cottage cheese. Milk at the right texture. Smooth frappes. Yogurt without nuts or coconut. Ask your health care provider whether you can have frozen desserts (such as malts or milk shakes) and thin liquids. Sweets/Desserts  Soft, smooth, moist desserts. Non-chewy, smooth candy. Jam. Jelly. Honey. Preserves. Ask your health care provider whether you can have frozen desserts. Fats and Oils  Butter. Oils. Margarine. Mayonnaise. Gravy. Spreads. Other  All seasonings and sweeteners. All sauces without large chunks. The items listed above may not be a complete list of recommended foods or beverages. Contact your dietitian for more options.  What foods are not recommended? Grains  Coarse or dry cereals. Dry breads. Toast. Crackers. Tough, crusty breads, such French bread and baguettes. Tough, crisp fried potatoes. Potato skins. Dry bread stuffing. Granola. Popcorn. Chips. Vegetables  All raw vegetables except shredded lettuce. Cooked corn. Rubbery or stiff cooked vegetables. Stringy vegetables, such as celery. Fruits  Hard fruits that are difficult to chew, such as apples or pears. Stringy, high-pulp fruits, such as pineapple, papaya, or mango. Fruits with tough skins, such as grapes. Coconut. All dried fruits. Fruit leather. Fruit roll-ups. Fruit snacks. Meat and Other Protein Sources  Dry or tough meats or poultry. Dry fish. Fish with bones. Peanut butter. All nuts and seeds. Dairy  Any with nuts, seeds, chocolate chips, dried fruit, coconut, or pineapple. Sweets/Desserts  Dry  cakes. Chewy or dry cookies. Any with nuts, seeds, dry fruits, coconut, pineapple, or anything dry, sticky, or hard. Chewy caramel. Licorice. Taffy-type candies. Ask your health care provider  whether you can have frozen desserts. Fats and Oils  Any with chunks, nuts, seeds, or pineapple. Olives. Angie Fava. Other  Soups with tough or large chunks of meats, poultry, or vegetables. Corn or clam chowder. The items listed above may not be a complete list of foods and beverages to avoid. Contact your dietitian for more information.  This information is not intended to replace advice given to you by your health care provider. Make sure you discuss any questions you have with your health care provider. Document Released: 05/12/2005 Document Revised: 10/18/2015 Document Reviewed: 04/25/2013 Elsevier Interactive Patient Education  2017 [employer].

## 2016-07-28 NOTE — Assessment & Plan Note (Signed)
GERD symptoms significantly improved on Nexium over Protonix. Continue these medications in addition to Zantac. Dysphagia evaluation as per below. Return for follow-up in 2 months.

## 2016-07-29 ENCOUNTER — Other Ambulatory Visit: Payer: Self-pay | Admitting: Family Medicine

## 2016-08-01 ENCOUNTER — Ambulatory Visit (HOSPITAL_COMMUNITY)
Admission: RE | Admit: 2016-08-01 | Discharge: 2016-08-01 | Disposition: A | Discharge status: Home / Self Care | Payer: Medicare Other | Source: Ambulatory Visit | Attending: Nurse Practitioner | Admitting: Nurse Practitioner

## 2016-08-01 DIAGNOSIS — K219 Gastro-esophageal reflux disease without esophagitis: Secondary | ICD-10-CM | POA: Diagnosis not present

## 2016-08-01 DIAGNOSIS — R1310 Dysphagia, unspecified: Secondary | ICD-10-CM | POA: Insufficient documentation

## 2016-09-30 ENCOUNTER — Ambulatory Visit: Payer: Medicare Other | Admitting: Nurse Practitioner

## 2016-09-30 ENCOUNTER — Telehealth: Payer: Self-pay | Admitting: Gastroenterology

## 2016-09-30 ENCOUNTER — Encounter: Payer: Self-pay | Admitting: Nurse Practitioner

## 2016-09-30 NOTE — Telephone Encounter (Signed)
PATIENT WAS A NO SHOW AND LETTER SENT  °

## 2016-09-30 NOTE — Telephone Encounter (Signed)
Noted  

## 2016-09-30 NOTE — Progress Notes (Deleted)
Referring Provider: Alycia Rossetti, MD Primary Care Physician:  Alycia Rossetti, MD Primary GI:  Dr. Gala Romney  No chief complaint on file.   HPI:   [name] is a 83 y.o. female who presents for follow-up on GERD and dysphagia. The patient was last seen in our office 07/28/2016 for the same. At that time noted referral for continued reflux despite Protonix every morning and Zantac at night as well as solid food dysphagia. Was trialed on Nexium in place of Protonix. At the time of her last visit her reflux is better on Nexium, noted chronic dysphagia for 7 days months but significantly improved on Nexium. Symptoms rare at that point. No other GI symptoms. Recommended continue Nexium and Zantac, barium pill esophagram, dysphagia 3 diet, return for follow-up in 2 months.  Barium pill esophagram completed 08/01/2016 which is essentially normal and symptoms likely from chronic GERD improving with medication titration.  Today she states   Past Medical History:  Diagnosis Date  . Arthritis   . Bilateral carpal tunnel syndrome   . Cataracts, both eyes   . Chronic diastolic CHF (congestive heart failure) ([place])    a. Echo (09/2012): Mild LVH, EF 65-70%, moderately elevated RVSP, moderate TR, mild to moderate MR, mild LAE, grade 2 diastolic dysfunction  . Diabetes mellitus [facility]  . Diabetic neuropathy (HCC) both hands and legs  . GERD (gastroesophageal reflux disease)   . Glaucoma   . Gout   . History of falling RECENT FALL 1 WK AGO--   NO INJURY  . 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 History of transfusion   . Hyperlipidemia   . Hypertension   . Mitral regurgitation   . PUD (peptic ulcer disease)    many yrs ago  . Right leg weakness   . Rotator cuff tear    left  . Slipped intervertebral disc   . Walker as ambulation aid     Past Surgical History:  Procedure Laterality Date  . ABDOMINAL HYSTERECTOMY  1966  . APPENDECTOMY    . CARDIOVASCULAR  STRESS TEST  06-18-2005   DR Daneen Schick   NORMAL STUDY/ NO EVIDENCE ISCHEMIA/ EF 75%  . CARPAL TUNNEL RELEASE  09/17/2011   Procedure: CARPAL TUNNEL RELEASE;  Surgeon: Magnus Sinning, MD;  Location: [facility];  Service: Orthopedics;  Laterality: Right;  . CARPAL TUNNEL RELEASE  10/22/2011   Procedure: CARPAL TUNNEL RELEASE;  Surgeon: Magnus Sinning, MD;  Location: [facility];  Service: Orthopedics;  Laterality: Left;  . EYE SURGERY    . LUMBAR LAMINECTOMY/DECOMPRESSION MICRODISCECTOMY  01/14/2012   Procedure: LUMBAR LAMINECTOMY/DECOMPRESSION MICRODISCECTOMY 3 LEVELS;  Surgeon: Magnus Sinning, MD;  Location: [facility];  Service: Orthopedics;  Laterality: N/A;  Decompression Laminectomy of L2 - L3, L3 - L4 and L4 - L5 Central  (X-Ray)  . SHOULDER OPEN ROTATOR CUFF REPAIR  06/02/2012   Procedure: ROTATOR CUFF REPAIR SHOULDER OPEN;  Surgeon: Magnus Sinning, MD;  Location: [facility];  Service: Orthopedics;  Laterality: Left;  Left Shoulder Open Distal Clavicle Resection Anterior Acrominectomy and Rotator Cuff Repair  . SPINE SURGERY    . TRANSTHORACIC ECHOCARDIOGRAM  12-26-2008  DR Daneen Schick   NORMAL LVF/  EF  71%/   MILD ASYMMETRIC SEPTAL HYPERTROPHY/ MILD LEFT ATRIAL ENLARGEMENT/ MODERATELY ELEVATED ESTIMATED RIGHT VENTRICULAR SYSTOLIC PRESSURE/ MILD MITRAL  &  TRICUSPID VALVE REGURG.  Current Outpatient Prescriptions  Medication Sig Dispense Refill  . acetaminophen (TYLENOL) 500 MG tablet Take 1,500 mg by mouth every 6 (six) hours as needed for mild pain.    Marland Kitchen allopurinol (ZYLOPRIM) 300 MG tablet Take 1 tablet by mouth  daily with breakfast 90 tablet 3  . amLODipine (NORVASC) 5 MG tablet Take 1 tablet by mouth  daily 90 tablet 3  . aspirin EC 81 MG tablet Take 81 mg by mouth daily.    . Diclofenac Sodium (PENNSAID) 2 % SOLN Apply three times as a day as as needed for joint pain 1 Bottle 1  . dorzolamide-timolol (COSOPT) 22.3-6.8 MG/ML ophthalmic solution  Place 1 drop into the right eye 2 (two) times daily.    Marland Kitchen esomeprazole (NEXIUM) 40 MG capsule Take 1 capsule (40 mg total) by mouth daily. 90 capsule 2  . furosemide (LASIX) 40 MG tablet 1/2 tablet by mouth daily 45 tablet 3  . gabapentin (NEURONTIN) 100 MG capsule TAKE 1 CAPSULE BY MOUTH AT  BEDTIME 90 capsule 3  . hydrALAZINE (APRESOLINE) 10 MG tablet TAKE 1 TABLET BY MOUTH TWO  TIMES DAILY 180 tablet 1  . latanoprost (XALATAN) 0.005 % ophthalmic solution Place 1 drop into the right eye 2 (two) times daily.     Marland Kitchen lisinopril (PRINIVIL,ZESTRIL) 40 MG tablet Take 1 tablet by mouth  every morning 90 tablet 3  . potassium chloride SA (K-DUR,KLOR-CON) 20 MEQ tablet Take 1 tablet (20 mEq total) by mouth daily. 90 tablet 3  . pravastatin (PRAVACHOL) 20 MG tablet TAKE 1 TABLET BY MOUTH  EVERY EVENING 90 tablet 1  . ranitidine (ZANTAC) 150 MG tablet Take 1 tablet by mouth at  bedtime 90 tablet 3  . timolol (BETIMOL) 0.5 % ophthalmic solution Place 1 drop into the left eye daily.     No current facility-administered medications for this visit.     Allergies as of 09/30/2016  . (No Known Allergies)    Family History  Problem Relation Age of Onset  . Hypertension Father   . Hypertension Mother   . Stroke Mother   . Diabetes Sister   . Hypertension Sister   . Cancer Sister   . Arthritis Sister   . Hypertension Brother   . Alcohol abuse Brother   . Hypertension Sister   . Arthritis Sister   . Hypertension Sister   . Arthritis Sister   . Arthritis Sister   . Arthritis Sister   . Heart attack Son   . Colon cancer Neg Hx     Social History   Social History  . Marital status: Widowed    Spouse name: N/A  . Number of children: N/A  . Years of education: N/A   Social History Main Topics  . Smoking status: Former Smoker    Packs/day: 1.00    Years: 30.00    Types: Cigarettes    Quit date: 05/28/1968  . Smokeless tobacco: Never Used  . Alcohol use No  . Drug use: No  . Sexual  activity: Not on file   Other Topics Concern  . Not on file   Social History Narrative  . No narrative on file    Review of Systems: General: Negative for anorexia, weight loss, fever, chills, fatigue, weakness. Eyes: Negative for vision changes.  ENT: Negative for hoarseness, difficulty swallowing , nasal congestion. CV: Negative for chest pain, angina, palpitations, dyspnea on exertion, peripheral edema.  Respiratory: Negative for dyspnea at rest, dyspnea on exertion, cough, sputum,  wheezing.  GI: See history of present illness. GU:  Negative for dysuria, hematuria, urinary incontinence, urinary frequency, nocturnal urination.  MS: Negative for joint pain, low back pain.  Derm: Negative for rash or itching.  Neuro: Negative for weakness, abnormal sensation, seizure, frequent headaches, memory loss, confusion.  Psych: Negative for anxiety, depression, suicidal ideation, hallucinations.  Endo: Negative for unusual weight change.  Heme: Negative for bruising or bleeding. Allergy: Negative for rash or hives.   Physical Exam: There were no vitals taken for this visit. General:   Alert and oriented. Pleasant and cooperative. Well-nourished and well-developed.  Head:  Normocephalic and atraumatic. Eyes:  Without icterus, sclera clear and conjunctiva pink.  Ears:  Normal auditory acuity. Mouth:  No deformity or lesions, oral mucosa pink.  Throat/Neck:  Supple, without mass or thyromegaly. Cardiovascular:  S1, S2 present without murmurs appreciated. Normal pulses noted. Extremities without clubbing or edema. Respiratory:  Clear to auscultation bilaterally. No wheezes, rales, or rhonchi. No distress.  Gastrointestinal:  +BS, soft, non-tender and non-distended. No HSM noted. No guarding or rebound. No masses appreciated.  Rectal:  Deferred  Musculoskalatal:  Symmetrical without gross deformities. Normal posture. Skin:  Intact without significant lesions or rashes. Neurologic:  Alert  and oriented x4;  grossly normal neurologically. Psych:  Alert and cooperative. Normal mood and affect. Heme/Lymph/Immune: No significant cervical adenopathy. No excessive bruising noted.    09/30/2016 8:25 AM   Disclaimer: This note was dictated with voice recognition software. Similar sounding words can inadvertently be transcribed and may not be corrected upon review.

## 2016-10-15 DIAGNOSIS — Z961 Presence of intraocular lens: Secondary | ICD-10-CM | POA: Diagnosis not present

## 2016-10-15 DIAGNOSIS — H401121 Primary open-angle glaucoma, left eye, mild stage: Secondary | ICD-10-CM | POA: Diagnosis not present

## 2016-10-15 DIAGNOSIS — E119 Type 2 diabetes mellitus without complications: Secondary | ICD-10-CM | POA: Diagnosis not present

## 2016-10-15 DIAGNOSIS — H401113 Primary open-angle glaucoma, right eye, severe stage: Secondary | ICD-10-CM | POA: Diagnosis not present

## 2017-01-13 ENCOUNTER — Other Ambulatory Visit: Payer: Self-pay | Admitting: Family Medicine

## 2017-01-13 ENCOUNTER — Encounter: Payer: Self-pay | Admitting: Family Medicine

## 2017-01-13 ENCOUNTER — Ambulatory Visit (INDEPENDENT_AMBULATORY_CARE_PROVIDER_SITE_OTHER): Payer: Medicare Other | Admitting: Family Medicine

## 2017-01-13 VITALS — BP 142/70 | HR 70 | Temp 98.2°F | Resp 16 | Ht 63.5 in | Wt 178.0 lb

## 2017-01-13 DIAGNOSIS — N183 Chronic kidney disease, stage 3 unspecified: Secondary | ICD-10-CM

## 2017-01-13 DIAGNOSIS — E119 Type 2 diabetes mellitus without complications: Secondary | ICD-10-CM

## 2017-01-13 DIAGNOSIS — R609 Edema, unspecified: Secondary | ICD-10-CM | POA: Diagnosis not present

## 2017-01-13 DIAGNOSIS — I5032 Chronic diastolic (congestive) heart failure: Secondary | ICD-10-CM | POA: Diagnosis not present

## 2017-01-13 DIAGNOSIS — I10 Essential (primary) hypertension: Secondary | ICD-10-CM | POA: Diagnosis not present

## 2017-01-13 LAB — CBC WITH DIFFERENTIAL/PLATELET
BASOS ABS: 0 {cells}/uL (ref 0–200)
Basophils Relative: 0 %
EOS PCT: 4 %
Eosinophils Absolute: 260 cells/uL (ref 15–500)
HCT: 32 % — ABNORMAL LOW (ref 35.0–45.0)
HEMOGLOBIN: 9.9 g/dL — AB (ref 12.0–15.0)
LYMPHS ABS: 1495 {cells}/uL (ref 850–3900)
LYMPHS PCT: 23 %
MCH: 29.5 pg (ref 27.0–33.0)
MCHC: 30.9 g/dL — AB (ref 32.0–36.0)
MCV: 95.2 fL (ref 80.0–100.0)
MPV: 9.2 fL (ref 7.5–12.5)
Monocytes Absolute: 455 cells/uL (ref 200–950)
Monocytes Relative: 7 %
NEUTROS PCT: 66 %
Neutro Abs: 4290 cells/uL (ref 1500–7800)
Platelets: 249 10*3/uL (ref 140–400)
RBC: 3.36 MIL/uL — ABNORMAL LOW (ref 3.80–5.10)
RDW: 17.1 % — ABNORMAL HIGH (ref 11.0–15.0)
WBC: 6.5 10*3/uL (ref 3.8–10.8)

## 2017-01-13 NOTE — Assessment & Plan Note (Signed)
Continue lasix 20mg  daily, can take 1 full tablet if swelling worsens acutely Given script for new compression hose

## 2017-01-13 NOTE — Assessment & Plan Note (Signed)
Controlled for age, no changes to medications No hypotensive episodes or falls

## 2017-01-13 NOTE — Assessment & Plan Note (Signed)
Diet controlled recheck labs and fasting lipids

## 2017-01-13 NOTE — Patient Instructions (Addendum)
F/U 4 months Physical  

## 2017-01-13 NOTE — Progress Notes (Signed)
   Subjective:    Patient ID: [name], female    DOB: 1923-07-11, 83 y.o.   MRN: [id number]  Patient presents for Follow-up (is fasting)  Patient follow-up chronic medical problems. Medications reviewed She states that her appetite is good, no  recent illness. She is here today with her daughter Hypertension she is taking her blood pressure medicines prescribed the amlodipine 5 mg as well as lisinopril 40 mg she's not had any hypotensive episodes.  Congestive heart failure she's not had no shortness of breath or chest pain recently she's currently on Lasix 40 mg once a day with potassium, she still followed by cardiology She does get SOB with exertion   Diabetes mellitus diet controlled her last A1c 5.6% in August 2017 due for repeat  Hyperlipidemia she is still tolerating her pravastatin, due for repeat lab  generlaized OA uses tylelnol and black see oil, has not had any falls   Review Of Systems:  GEN- denies fatigue, fever, weight loss,weakness, recent illness HEENT- denies eye drainage, change in vision, nasal discharge, CVS- denies chest pain, palpitations RESP- denies SOB, cough, wheeze ABD- denies N/V, change in stools, abd pain GU- denies dysuria, hematuria, dribbling, incontinence MSK-+ joint pain, muscle aches, injury Neuro- denies headache, dizziness, syncope, seizure activity       Objective:    BP (!) 142/70 Comment: has not had medications  Pulse 70   Temp 98.2 F (36.8 C) (Oral)   Resp 16   Ht 5' 3.5" (1.613 m)   Wt 178 lb (80.7 kg)   SpO2 98%   BMI 31.04 kg/m  GEN- NAD, alert and oriented x3,walking with waker  HEENT- PERRL, EOMI, non injected sclera, pink conjunctiva, MMM, oropharynx clear Neck- Supple, no JVD  CVS- RRR,   no murmur RESP-CTAB ABD-NABS,soft,NT,ND EXT- non pitting pedal edema Pulses- Radial 2+, DP 1+       Assessment & Plan:      Problem List Items Addressed This Visit      Unprioritized   CKD (chronic kidney  disease), stage III - Primary   Relevant Orders   Comprehensive metabolic panel   Peripheral edema   Essential hypertension    Controlled for age, no changes to medications No hypotensive episodes or falls      Relevant Orders   CBC with Differential/Platelet   Lipid panel   Diabetes mellitus, type II ([place])    Diet controlled recheck labs and fasting lipids      Relevant Orders   Hemoglobin A1c   Lipid panel   Chronic diastolic CHF (congestive heart failure) (HCC)    Continue lasix 20mg  daily, can take 1 full tablet if swelling worsens acutely Given script for new compression hose          Note: This dictation was prepared with Dragon dictation along with smaller phrase technology. Any transcriptional errors that result from this process are unintentional.

## 2017-01-14 LAB — LIPID PANEL
CHOL/HDL RATIO: 3.5 ratio (ref <5.0)
Cholesterol: 145 mg/dL (ref <200)
HDL: 42 mg/dL — ABNORMAL LOW (ref >50)
LDL Cholesterol: 67 mg/dL (ref <100)
Triglycerides: 180 mg/dL — ABNORMAL HIGH (ref <150)
VLDL: 36 mg/dL — ABNORMAL HIGH (ref <30)

## 2017-01-14 LAB — HEMOGLOBIN A1C
Hgb A1c MFr Bld: 6.1 % — ABNORMAL HIGH (ref <5.7)
MEAN PLASMA GLUCOSE: 128 mg/dL

## 2017-01-14 LAB — COMPREHENSIVE METABOLIC PANEL
ALBUMIN: 4 g/dL (ref 3.6–5.1)
ALT: 6 U/L (ref 6–29)
AST: 12 U/L (ref 10–35)
Alkaline Phosphatase: 161 U/L — ABNORMAL HIGH (ref 33–130)
BUN: 32 mg/dL — ABNORMAL HIGH (ref 7–25)
CHLORIDE: 111 mmol/L — AB (ref 98–110)
CO2: 20 mmol/L (ref 20–32)
CREATININE: 1.51 mg/dL — AB (ref 0.60–0.88)
Calcium: 9.4 mg/dL (ref 8.6–10.4)
Glucose, Bld: 116 mg/dL — ABNORMAL HIGH (ref 70–99)
Potassium: 4.5 mmol/L (ref 3.5–5.3)
SODIUM: 143 mmol/L (ref 135–146)
Total Bilirubin: 0.4 mg/dL (ref 0.2–1.2)
Total Protein: 6.5 g/dL (ref 6.1–8.1)

## 2017-01-15 ENCOUNTER — Encounter: Payer: Self-pay | Admitting: *Deleted

## 2017-01-15 ENCOUNTER — Other Ambulatory Visit: Payer: Self-pay | Admitting: *Deleted

## 2017-01-15 MED ORDER — AMLODIPINE BESYLATE 5 MG PO TABS: 5.0000 mg | ORAL_TABLET | Freq: Every day | ORAL | 1 refills | Status: DC

## 2017-01-15 NOTE — Telephone Encounter (Signed)
Received fax requesting refill on Amlodipine.   Refill appropriate and filled per protocol.

## 2017-02-24 ENCOUNTER — Other Ambulatory Visit: Payer: Self-pay | Admitting: Family Medicine

## 2017-02-24 ENCOUNTER — Other Ambulatory Visit: Payer: Self-pay | Admitting: Physician Assistant

## 2017-02-26 NOTE — Telephone Encounter (Signed)
Okay to refill of should this be deferred to patients pcp? If okay should the sig be changed to qd prn when taking furosemide? Please advise.Thanks, MI

## 2017-02-26 NOTE — Telephone Encounter (Signed)
Needs to make an appointment with cardiology

## 2017-03-17 ENCOUNTER — Other Ambulatory Visit: Payer: Self-pay | Admitting: Family Medicine

## 2017-04-23 DIAGNOSIS — Z961 Presence of intraocular lens: Secondary | ICD-10-CM | POA: Diagnosis not present

## 2017-04-23 DIAGNOSIS — H401123 Primary open-angle glaucoma, left eye, severe stage: Secondary | ICD-10-CM | POA: Diagnosis not present

## 2017-04-23 DIAGNOSIS — H40011 Open angle with borderline findings, low risk, right eye: Secondary | ICD-10-CM | POA: Diagnosis not present

## 2017-05-02 ENCOUNTER — Other Ambulatory Visit: Payer: Self-pay | Admitting: Family Medicine

## 2017-05-14 ENCOUNTER — Encounter: Payer: Self-pay | Admitting: Family Medicine

## 2017-05-14 ENCOUNTER — Ambulatory Visit: Payer: Medicare Other | Admitting: Family Medicine

## 2017-05-14 ENCOUNTER — Other Ambulatory Visit: Payer: Self-pay

## 2017-05-14 ENCOUNTER — Telehealth: Payer: Self-pay | Admitting: Family Medicine

## 2017-05-14 VITALS — BP 120/64 | HR 68 | Temp 97.8°F | Resp 18 | Wt 183.4 lb

## 2017-05-14 DIAGNOSIS — R0600 Dyspnea, unspecified: Secondary | ICD-10-CM

## 2017-05-14 DIAGNOSIS — R609 Edema, unspecified: Secondary | ICD-10-CM

## 2017-05-14 DIAGNOSIS — N183 Chronic kidney disease, stage 3 unspecified: Secondary | ICD-10-CM

## 2017-05-14 DIAGNOSIS — R0609 Other forms of dyspnea: Secondary | ICD-10-CM

## 2017-05-14 DIAGNOSIS — I5032 Chronic diastolic (congestive) heart failure: Secondary | ICD-10-CM

## 2017-05-14 DIAGNOSIS — I10 Essential (primary) hypertension: Secondary | ICD-10-CM

## 2017-05-14 DIAGNOSIS — E119 Type 2 diabetes mellitus without complications: Secondary | ICD-10-CM | POA: Diagnosis not present

## 2017-05-14 DIAGNOSIS — K219 Gastro-esophageal reflux disease without esophagitis: Secondary | ICD-10-CM

## 2017-05-14 LAB — CBC WITH DIFFERENTIAL/PLATELET
BASOS PCT: 0.4 %
Basophils Absolute: 30 cells/uL (ref 0–200)
Eosinophils Absolute: 210 cells/uL (ref 15–500)
Eosinophils Relative: 2.8 %
HCT: 30.4 % — ABNORMAL LOW (ref 35.0–45.0)
Hemoglobin: 9.8 g/dL — ABNORMAL LOW (ref 11.7–15.5)
Lymphs Abs: 1275 cells/uL (ref 850–3900)
MCH: 30.1 pg (ref 27.0–33.0)
MCHC: 32.2 g/dL (ref 32.0–36.0)
MCV: 93.3 fL (ref 80.0–100.0)
MONOS PCT: 6.5 %
MPV: 10.4 fL (ref 7.5–12.5)
Neutro Abs: 5498 cells/uL (ref 1500–7800)
Neutrophils Relative %: 73.3 %
PLATELETS: 248 10*3/uL (ref 140–400)
RBC: 3.26 10*6/uL — AB (ref 3.80–5.10)
RDW: 15.7 % — ABNORMAL HIGH (ref 11.0–15.0)
TOTAL LYMPHOCYTE: 17 %
WBC mixed population: 488 cells/uL (ref 200–950)
WBC: 7.5 10*3/uL (ref 3.8–10.8)

## 2017-05-14 LAB — COMPREHENSIVE METABOLIC PANEL
AG Ratio: 1.6 (calc) (ref 1.0–2.5)
ALBUMIN MSPROF: 4.1 g/dL (ref 3.6–5.1)
ALT: 6 U/L (ref 6–29)
AST: 10 U/L (ref 10–35)
Alkaline phosphatase (APISO): 168 U/L — ABNORMAL HIGH (ref 33–130)
BUN / CREAT RATIO: 23 (calc) — AB (ref 6–22)
BUN: 41 mg/dL — ABNORMAL HIGH (ref 7–25)
CHLORIDE: 110 mmol/L (ref 98–110)
CO2: 22 mmol/L (ref 20–32)
CREATININE: 1.75 mg/dL — AB (ref 0.60–0.88)
Calcium: 9.5 mg/dL (ref 8.6–10.4)
GLOBULIN: 2.6 g/dL (ref 1.9–3.7)
GLUCOSE: 127 mg/dL — AB (ref 65–99)
POTASSIUM: 4.5 mmol/L (ref 3.5–5.3)
Sodium: 141 mmol/L (ref 135–146)
Total Bilirubin: 0.3 mg/dL (ref 0.2–1.2)
Total Protein: 6.7 g/dL (ref 6.1–8.1)

## 2017-05-14 MED ORDER — ESOMEPRAZOLE MAGNESIUM 40 MG PO CPDR: 40.0000 mg | DELAYED_RELEASE_CAPSULE | Freq: Every day | ORAL | 2 refills | Status: DC

## 2017-05-14 NOTE — Patient Instructions (Addendum)
Take full tablet of lasix with potassium 1 full tablet Restart the nexium in the morning, zantac at bedtime  You will see  Cardiology Jan 3rd - Thursday 8:15am  F/u 4 MONTHS

## 2017-05-14 NOTE — Telephone Encounter (Signed)
Pt wants 90 day supply of nexium called in to her [facility], [facility].

## 2017-05-14 NOTE — Assessment & Plan Note (Signed)
Restart nexium in AM, zantac at bedtime

## 2017-05-14 NOTE — Progress Notes (Signed)
Subjective:    Patient ID: [name], female    DOB: 1924/05/17, 83 y.o.   MRN: [id number]  Patient presents for routine check up (is fasting)  Pt here to f/u chronic medical problems   DM- diet controlled, last A1C 6.1%, hhome   Hyperlipidemia- LDL at goal   CHF- taking lasix, weight up 5lbs since visit in August, has been giving out of breath more , walking only short distances before she gives out of breath , sleeping on 2 pillows with head elevated for quite some time  Lasix 20mg   she has not had any cough or wheezing   HTN- taking BP meds as prescribed   Feels food does not digest and getting heartburn , out of nexium,tkaing zantac, she denies any choking with eating or coughing episodes.  He was seen by gastroenterology earlier this year, had upper GI which showed normal swallowing but chronic GERD symptoms.  Her PHQ 9 score noted that she has a lot of days where she is tired and just sits around.  She has little energy.  And sometimes does not sleep as well as but she has to get up to use the restroom.  She denies feeling depressed.  Review Of Systems:  GEN- denies fatigue, fever, weight loss,weakness, recent illness HEENT- denies eye drainage, change in vision, nasal discharge, CVS- denies chest pain, palpitations RESP-+SOB,  Denies cough, wheeze ABD- denies N/V, change in stools, abd pain GU- denies dysuria, hematuria, dribbling, incontinence MSK- denies joint pain, muscle aches, injury Neuro- denies headache, dizziness, syncope, seizure activity       Objective:    BP 120/64 (BP Location: Right Arm, Patient Position: Sitting, Cuff Size: Normal)   Pulse 68   Temp 97.8 F (36.6 C) (Oral)   Resp 18   Wt 183 lb 6.4 oz (83.2 kg)   BMI 31.98 kg/m   GEN- NAD, alert and oriented x3,walking with waker  HEENT- PERRL, EOMI, non injected sclera, pink conjunctiva, MMM, oropharynx clear Neck- Supple, no JVD  CVS- RRR,   no  murmur RESP-CTAB ABD-NABS,soft,NT,ND Psych- normal affect and mood EXT-1+ pitting pedal edema Pulses- Radial 2+, DP 1+   EKG- NSR, inverted t wave in lateral leads , few PAC     Assessment & Plan:      Problem List Items Addressed This Visit      Unprioritized   Peripheral edema   Relevant Orders   EKG 12-Lead (Completed)   EKG 12-Lead (Completed)   Essential hypertension   Relevant Orders   EKG 12-Lead (Completed)   EKG 12-Lead (Completed)   GERD (gastroesophageal reflux disease)    Restart nexium in AM, zantac at bedtime      Relevant Orders   EKG 12-Lead (Completed)   EKG 12-Lead (Completed)   Diabetes mellitus, type II (HCC)    Diet controlled, a1c and lipids at goal      Relevant Orders   EKG 12-Lead (Completed)   EKG 12-Lead (Completed)   CKD (chronic kidney disease), stage III (HCC)    Check renal function with increase in diurietic      Relevant Orders   EKG 12-Lead (Completed)   EKG 12-Lead (Completed)   Chronic diastolic CHF (congestive heart failure) (HCC)    Increase lasix to 40mg  daily with the potassium Recheck in 1-2 weeks Blood pressure controlled She does have some PAC and few changes in lateral leads Will get appointment with her cardiologist as well      Relevant  Orders   EKG 12-Lead (Completed)   CBC with Differential/Platelet   Comprehensive metabolic panel   Brain natriuretic peptide   EKG 12-Lead (Completed)    Other Visit Diagnoses    DOE (dyspnea on exertion)    -  Primary   Relevant Orders   EKG 12-Lead (Completed)   CBC with Differential/Platelet   Comprehensive metabolic panel   Brain natriuretic peptide   EKG 12-Lead (Completed)      Note: This dictation was prepared with Dragon dictation along with smaller phrase technology. Any transcriptional errors that result from this process are unintentional.

## 2017-05-14 NOTE — Assessment & Plan Note (Signed)
Check renal function with increase in diurietic

## 2017-05-14 NOTE — Assessment & Plan Note (Addendum)
Increase lasix to 40mg  daily with the potassium Recheck in 1-2 weeks Blood pressure controlled She does have some PAC and few changes in lateral leads Will get appointment with her cardiologist as well

## 2017-05-14 NOTE — Telephone Encounter (Signed)
Requested prescription sent to pharmacy.   Also received VM from patient. Requested prescription for Voltaren Gel 1% of arthritis of hips/ knees.   MD please advise.

## 2017-05-14 NOTE — Assessment & Plan Note (Signed)
Diet controlled, a1c and lipids at goal

## 2017-05-15 LAB — BRAIN NATRIURETIC PEPTIDE: Brain Natriuretic Peptide: 142 pg/mL — ABNORMAL HIGH (ref <100)

## 2017-05-15 MED ORDER — DICLOFENAC SODIUM 1 % TD GEL: 2.0000 g | Freq: Four times a day (QID) | TRANSDERMAL | 3 refills | Status: DC

## 2017-05-15 NOTE — Telephone Encounter (Signed)
Okay to send both prescriptions

## 2017-05-15 NOTE — Telephone Encounter (Signed)
Prescription sent to pharmacy.

## 2017-05-18 ENCOUNTER — Encounter: Payer: Self-pay | Admitting: *Deleted

## 2017-05-18 ENCOUNTER — Telehealth: Payer: Self-pay | Admitting: *Deleted

## 2017-05-18 NOTE — Telephone Encounter (Signed)
[facility] is reviewing your PA request. Typically an electronic response will be received within 72 hours. To check for an update later, open this request from your dashboard. 

## 2017-05-18 NOTE — Telephone Encounter (Signed)
Received request from pharmacy for PA on Voltaren Gel.   PA submitted.   Dx: M17.00

## 2017-05-22 MED ORDER — DICLOFENAC SODIUM 1 % TD GEL: 2.0000 g | Freq: Four times a day (QID) | TRANSDERMAL | 3 refills | Status: DC

## 2017-05-22 NOTE — Telephone Encounter (Signed)
Received PA determination.   PA approved 05/18/2017- 05/25/2018.  [employer] [id number].  Pharmacy made aware.

## 2017-05-27 ENCOUNTER — Ambulatory Visit: Payer: Medicare Other | Admitting: Physician Assistant

## 2017-05-28 ENCOUNTER — Ambulatory Visit: Payer: Medicare Other | Admitting: Physician Assistant

## 2017-06-03 ENCOUNTER — Encounter: Payer: Self-pay | Admitting: Physician Assistant

## 2017-06-03 ENCOUNTER — Ambulatory Visit: Payer: Medicare Other | Admitting: Physician Assistant

## 2017-06-03 VITALS — BP 124/62 | HR 60 | Ht 64.0 in | Wt 185.8 lb

## 2017-06-03 DIAGNOSIS — E785 Hyperlipidemia, unspecified: Secondary | ICD-10-CM | POA: Diagnosis not present

## 2017-06-03 DIAGNOSIS — R0602 Shortness of breath: Secondary | ICD-10-CM

## 2017-06-03 DIAGNOSIS — N183 Chronic kidney disease, stage 3 unspecified: Secondary | ICD-10-CM

## 2017-06-03 DIAGNOSIS — I5032 Chronic diastolic (congestive) heart failure: Secondary | ICD-10-CM | POA: Diagnosis not present

## 2017-06-03 DIAGNOSIS — I10 Essential (primary) hypertension: Secondary | ICD-10-CM

## 2017-06-03 MED ORDER — FUROSEMIDE 40 MG PO TABS: 40.0000 mg | ORAL_TABLET | Freq: Every day | ORAL | 3 refills | Status: DC

## 2017-06-03 NOTE — Progress Notes (Signed)
Cardiology Office Note:    Date:  06/03/2017   ID:  [name], DOB 28-Jan-1924, MRN [id number]  PCP:  Alycia Rossetti, MD  Cardiologist:  Sinclair Grooms, MD   Referring MD: Alycia Rossetti, MD   Chief Complaint  Patient presents with  . Congestive Heart Failure    History of Present Illness:    [name] is a 84 y.o. female with a hx of diastolic heart failure, diabetes, chronic kidney disease, hypertension, hyperlipidemia, prior stroke, spinal stenosis, bradycardia.  She was last seen in clinic in June 2017 by Angelena Form, PA-C.  She was seen by primary care May 14, 2017 with worsening heart failure symptoms.  [name] returns for evaluation of heart failure.  She is here today with her daughters.  Over the past 6 months, she has noticed worsening shortness of breath with activity.  She gets winded walking back and forth to the bathroom.  She sleeps on 3 pillows.  She denies PND.  She notes worsening LE edema.  She denies chest discomfort or syncope.  She denies cough but has noticed some wheezing.  She also notices increased "phlegm" at times.  Prior CV studies:   The following studies were reviewed today:  Echo (09/2012):  Mild LVH, EF 65-70%, moderately elevated RVSP, moderate TR, mild to moderate MR, mild LAE, grade 2 diastolic dysfunction  Abdominal US (8/11):  No AAA.  Nuclear (05/2005):  No ischemia, normal study, EF 75%  Carotid US (6/05):   Bilateral plaque without hemodynamically significant stenosis   Past Medical History:  Diagnosis Date  . Arthritis   . Bilateral carpal tunnel syndrome   . Cataracts, both eyes   . Chronic diastolic CHF (congestive heart failure) ([place])    a. Echo (09/2012): Mild LVH, EF 65-70%, moderately elevated RVSP, moderate TR, mild to moderate MR, mild LAE, grade 2 diastolic dysfunction  . Diabetes mellitus [facility]  . Diabetic neuropathy (HCC) both hands and legs  . GERD (gastroesophageal reflux disease)   .  Glaucoma   . Gout   . History of falling RECENT FALL 1 WK AGO--   NO INJURY  . 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 History of transfusion   . Hyperlipidemia   . Hypertension   . Mitral regurgitation   . PUD (peptic ulcer disease)    many yrs ago  . Right leg weakness   . Rotator cuff tear    left  . Slipped intervertebral disc   . Walker as ambulation aid     Past Surgical History:  Procedure Laterality Date  . ABDOMINAL HYSTERECTOMY  1966  . APPENDECTOMY    . CARDIOVASCULAR STRESS TEST  06-18-2005   DR Daneen Schick   NORMAL STUDY/ NO EVIDENCE ISCHEMIA/ EF 75%  . CARPAL TUNNEL RELEASE  09/17/2011   Procedure: CARPAL TUNNEL RELEASE;  Surgeon: Magnus Sinning, MD;  Location: [facility];  Service: Orthopedics;  Laterality: Right;  . CARPAL TUNNEL RELEASE  10/22/2011   Procedure: CARPAL TUNNEL RELEASE;  Surgeon: Magnus Sinning, MD;  Location: [facility];  Service: Orthopedics;  Laterality: Left;  . EYE SURGERY    . LUMBAR LAMINECTOMY/DECOMPRESSION MICRODISCECTOMY  01/14/2012   Procedure: LUMBAR LAMINECTOMY/DECOMPRESSION MICRODISCECTOMY 3 LEVELS;  Surgeon: Magnus Sinning, MD;  Location: [facility];  Service: Orthopedics;  Laterality: N/A;  Decompression Laminectomy of L2 - L3, L3 - L4 and L4 -  L5 Central  (X-Ray)  . SHOULDER OPEN ROTATOR CUFF REPAIR  06/02/2012   Procedure: ROTATOR CUFF REPAIR SHOULDER OPEN;  Surgeon: Magnus Sinning, MD;  Location: [facility];  Service: Orthopedics;  Laterality: Left;  Left Shoulder Open Distal Clavicle Resection Anterior Acrominectomy and Rotator Cuff Repair  . SPINE SURGERY    . TRANSTHORACIC ECHOCARDIOGRAM  12-26-2008  DR Daneen Schick   NORMAL LVF/  EF  71%/   MILD ASYMMETRIC SEPTAL HYPERTROPHY/ MILD LEFT ATRIAL ENLARGEMENT/ MODERATELY ELEVATED ESTIMATED RIGHT VENTRICULAR SYSTOLIC PRESSURE/ MILD MITRAL  &  TRICUSPID VALVE REGURG.    Current Medications: Current Meds  Medication Sig  .  acetaminophen (TYLENOL) 500 MG tablet Take 1,500 mg by mouth every 6 (six) hours as needed for mild pain.  Marland Kitchen allopurinol (ZYLOPRIM) 300 MG tablet TAKE 1 TABLET BY MOUTH  DAILY WITH BREAKFAST  . amLODipine (NORVASC) 5 MG tablet Take 1 tablet (5 mg total) by mouth daily.  Marland Kitchen aspirin EC 81 MG tablet Take 81 mg by mouth daily.  . diclofenac sodium (VOLTAREN) 1 % GEL Apply 2 g topically 4 (four) times daily.  . dorzolamide-timolol (COSOPT) 22.3-6.8 MG/ML ophthalmic solution Place 1 drop into the right eye 2 (two) times daily.  Marland Kitchen esomeprazole (NEXIUM) 40 MG capsule Take 1 capsule (40 mg total) by mouth daily.  Marland Kitchen gabapentin (NEURONTIN) 100 MG capsule TAKE 1 CAPSULE BY MOUTH AT  BEDTIME  . hydrALAZINE (APRESOLINE) 10 MG tablet TAKE 1 TABLET BY MOUTH TWO  TIMES DAILY  . latanoprost (XALATAN) 0.005 % ophthalmic solution Place 1 drop into the right eye 2 (two) times daily.   Marland Kitchen lisinopril (PRINIVIL,ZESTRIL) 40 MG tablet TAKE 1 TABLET BY MOUTH  EVERY MORNING  . meloxicam (MOBIC) 15 MG tablet TAKE 1 TABLET BY MOUTH  DAILY  . pantoprazole (PROTONIX) 40 MG tablet TAKE 1 TABLET BY MOUTH  DAILY  . potassium chloride SA (K-DUR,KLOR-CON) 20 MEQ tablet TAKE 1 TABLET BY MOUTH  DAILY  . pravastatin (PRAVACHOL) 20 MG tablet TAKE 1 TABLET BY MOUTH  EVERY EVENING  . ranitidine (ZANTAC) 150 MG tablet TAKE 1 TABLET BY MOUTH AT  BEDTIME  . timolol (BETIMOL) 0.5 % ophthalmic solution Place 1 drop into the left eye daily.  . [DISCONTINUED] furosemide (LASIX) 40 MG tablet TAKE ONE-HALF TABLET BY  MOUTH DAILY     Allergies:   Patient has no known allergies.   Social History   Tobacco Use  . Smoking status: Former Smoker    Packs/day: 1.00    Years: 30.00    Pack years: 30.00    Types: Cigarettes    Last attempt to quit: 05/28/1968    Years since quitting: 49.0  . Smokeless tobacco: Never Used  Substance Use Topics  . Alcohol use: No  . Drug use: No     Family Hx: The patient's family history includes Alcohol  abuse in her brother; Arthritis in her sister, sister, sister, sister, and sister; Cancer in her sister; Diabetes in her sister; Heart attack in her son; Hypertension in her brother, father, mother, sister, sister, and sister; Stroke in her mother. There is no history of Colon cancer.  ROS:   Please see the history of present illness.    Review of Systems  Cardiovascular: Positive for dyspnea on exertion, irregular heartbeat and leg swelling.  Respiratory: Positive for shortness of breath.   Hematologic/Lymphatic: Bruises/bleeds easily.  Musculoskeletal: Positive for back pain and myalgias.  Neurological: Positive for loss of balance.   All other  systems reviewed and are negative.   EKGs/Labs/Other Test Reviewed:    EKG:  EKG is not ordered today.  The ekg from 05/14/17 was reviewed-this demonstrates normal sinus rhythm, heart rate 67, normal axis, PAC, QT 418-no significant change compared to prior tracings  Recent Labs: 05/14/2017: ALT 6; Brain Natriuretic Peptide 142; BUN 41; Creat 1.75; Hemoglobin 9.8; Platelets 248; Potassium 4.5; Sodium 141   Recent Lipid Panel Lab Results  Component Value Date/Time   CHOL 145 01/13/2017 08:25 AM   TRIG 180 (H) 01/13/2017 08:25 AM   HDL 42 (L) 01/13/2017 08:25 AM   CHOLHDL 3.5 01/13/2017 08:25 AM   LDLCALC 67 01/13/2017 08:25 AM    Physical Exam:    VS:  BP 124/62   Pulse 60   Ht 5\' 4"  (1.626 m)   Wt 185 lb 12.8 oz (84.3 kg)   SpO2 98%   BMI 31.89 kg/m     Wt Readings from Last 3 Encounters:  06/03/17 185 lb 12.8 oz (84.3 kg)  05/14/17 183 lb 6.4 oz (83.2 kg)  01/13/17 178 lb (80.7 kg)     Physical Exam  Constitutional: She is oriented to person, place, and time. She appears well-developed and well-nourished. No distress.  HENT:  Head: Normocephalic and atraumatic.  Neck: JVD (JVP 5-6 cm) present.  Cardiovascular: Normal rate and regular rhythm.  No murmur heard. Pulmonary/Chest: Effort normal. She has no rales.  Abdominal:  Soft.  Musculoskeletal: She exhibits edema (trace-1+ bilateral LE edema).  Neurological: She is alert and oriented to person, place, and time.  Skin: Skin is warm and dry.    ASSESSMENT:    1. Shortness of breath   2. Chronic diastolic CHF (congestive heart failure) ([place])   3. Essential hypertension   4. Hyperlipidemia, unspecified hyperlipidemia type   5. CKD (chronic kidney disease), stage III ([place])    PLAN:    In order of problems listed above:  1. Shortness of breath She has progressively worsening shortness of breath over the past 6 months as well as lower extremity edema and 3 pillow orthopnea.  She displays some signs of volume excess.  Recent EKG did not demonstrate any significant changes.  At this point, I would try to manage her volume with diuretics.  We will also obtain an echocardiogram.  At this point, I do not believe that she needs stress testing performed.  We will try to avoid this as she would be high risk for proceeding with cardiac catheterization.  -Increase Lasix to 40 mg daily  -Basic metabolic panel 1 week  -Obtain echocardiogram  -Low-salt diet  -Follow-up with Dr. Tamala Julian or me in 3-4 weeks  2. Chronic diastolic CHF (congestive heart failure) ([place])  As noted, she is somewhat volume overloaded.  Increase Lasix as outlined above.  She does admit to a diet high in salt.  We discussed the importance of a low-sodium diet.  Obtain echocardiogram and proceed with close follow-up.  3. Essential hypertension The patient's blood pressure is controlled on her current regimen.  Continue current therapy.   4. Hyperlipidemia, unspecified hyperlipidemia type Continue statin  5. CKD (chronic kidney disease), stage III ([place])   - Plan: Basic Metabolic Panel (BMET) in 1 week   Dispo:  Return in about 4 weeks (around 07/01/2017) for Close Follow Up with Dr. Tamala Julian or [name], PA-C .   Medication Adjustments/Labs and Tests Ordered: Current medicines are reviewed at  length with the patient today.  Concerns regarding medicines are outlined  above.  Tests Ordered: Orders Placed This Encounter  Procedures  . Basic Metabolic Panel (BMET)  . ECHOCARDIOGRAM COMPLETE   Medication Changes: Meds ordered this encounter  Medications  . furosemide (LASIX) 40 MG tablet    Sig: Take 1 tablet (40 mg total) by mouth daily.    Dispense:  90 tablet    Refill:  3    Signed, [name], PA-C  06/03/2017 11:08 AM    [facility] [address], [place], [place]  [address] Phone: [phone]; Fax: [phone]

## 2017-06-03 NOTE — Patient Instructions (Signed)
Medication Instructions:  1. INCREASE LASIX TO 40 MG DAILY; NEW RX HAS BEEN SENT IN   Labwork: BMET TO BE DONE IN 1 WEEK  Testing/Procedures: Your physician has requested that you have an echocardiogram. Echocardiography is a painless test that uses sound waves to create images of your heart. It provides your doctor with information about the size and shape of your heart and how well your heart's chambers and valves are working. This procedure takes approximately one hour. There are no restrictions for this procedure.    Follow-Up: DR. Tamala [name] IN 3-4 WEEKS OR [facility], PAC SAME DAY DR. Tamala [name] IS IN THE OFFICE  Any Other Special Instructions Will Be Listed Below (If Applicable). If you need a refill on your cardiac medications before your next appointment, please call your pharmacy.   Low-Sodium Eating Plan Sodium, which is an element that makes up salt, helps you maintain a healthy balance of fluids in your body. Too much sodium can increase your blood pressure and cause fluid and waste to be held in your body. Your health care provider or dietitian may recommend following this plan if you have high blood pressure (hypertension), kidney disease, liver disease, or heart failure. Eating less sodium can help lower your blood pressure, reduce swelling, and protect your heart, liver, and kidneys. What are tips for following this plan? General guidelines  Most people on this plan should limit their sodium intake to 1,500-2,000 mg (milligrams) of sodium each day. Reading food labels  The Nutrition Facts label lists the amount of sodium in one serving of the food. If you eat more than one serving, you must multiply the listed amount of sodium by the number of servings.  Choose foods with less than 140 mg of sodium per serving.  Avoid foods with 300 mg of sodium or more per serving. Shopping  Look for lower-sodium products, often labeled as "low-sodium" or "no salt added."  Always check  the sodium content even if foods are labeled as "unsalted" or "no salt added".  Buy fresh foods. ? Avoid canned foods and premade or frozen meals. ? Avoid canned, cured, or processed meats  Buy breads that have less than 80 mg of sodium per slice. Cooking  Eat more home-cooked food and less restaurant, buffet, and fast food.  Avoid adding salt when cooking. Use salt-free seasonings or herbs instead of table salt or sea salt. Check with your health care provider or pharmacist before using salt substitutes.  Cook with plant-based oils, such as canola, sunflower, or olive oil. Meal planning  When eating at a restaurant, ask that your food be prepared with less salt or no salt, if possible.  Avoid foods that contain MSG (monosodium glutamate). MSG is sometimes added to [place] food, bouillon, and some canned foods. What foods are recommended? The items listed may not be a complete list. Talk with your dietitian about what dietary choices are best for you. Grains Low-sodium cereals, including oats, puffed wheat and rice, and shredded wheat. Low-sodium crackers. Unsalted rice. Unsalted pasta. Low-sodium bread. Whole-grain breads and whole-grain pasta. Vegetables Fresh or frozen vegetables. "No salt added" canned vegetables. "No salt added" tomato sauce and paste. Low-sodium or reduced-sodium tomato and vegetable juice. Fruits Fresh, frozen, or canned fruit. Fruit juice. Meats and other protein foods Fresh or frozen (no salt added) meat, poultry, seafood, and fish. Low-sodium canned tuna and salmon. Unsalted nuts. Dried peas, beans, and lentils without added salt. Unsalted canned beans. Eggs. Unsalted nut butters. Dairy Milk.  Soy milk. Cheese that is naturally low in sodium, such as ricotta cheese, fresh mozzarella, or Swiss cheese Low-sodium or reduced-sodium cheese. Cream cheese. Yogurt. Fats and oils Unsalted butter. Unsalted margarine with no trans fat. Vegetable oils such as canola or  olive oils. Seasonings and other foods Fresh and dried herbs and spices. Salt-free seasonings. Low-sodium mustard and ketchup. Sodium-free salad dressing. Sodium-free light mayonnaise. Fresh or refrigerated horseradish. Lemon juice. Vinegar. Homemade, reduced-sodium, or low-sodium soups. Unsalted popcorn and pretzels. Low-salt or salt-free chips. What foods are not recommended? The items listed may not be a complete list. Talk with your dietitian about what dietary choices are best for you. Grains Instant hot cereals. Bread stuffing, pancake, and biscuit mixes. Croutons. Seasoned rice or pasta mixes. Noodle soup cups. Boxed or frozen macaroni and cheese. Regular salted crackers. Self-rising flour. Vegetables Sauerkraut, pickled vegetables, and relishes. Olives. [place] fries. Onion rings. Regular canned vegetables (not low-sodium or reduced-sodium). Regular canned tomato sauce and paste (not low-sodium or reduced-sodium). Regular tomato and vegetable juice (not low-sodium or reduced-sodium). Frozen vegetables in sauces. Meats and other protein foods Meat or fish that is salted, canned, smoked, spiced, or pickled. Bacon, ham, sausage, hotdogs, corned beef, chipped beef, packaged lunch meats, salt pork, jerky, pickled herring, anchovies, regular canned tuna, sardines, salted nuts. Dairy Processed cheese and cheese spreads. Cheese curds. Blue cheese. Feta cheese. String cheese. Regular cottage cheese. Buttermilk. Canned milk. Fats and oils Salted butter. Regular margarine. Ghee. Bacon fat. Seasonings and other foods Onion salt, garlic salt, seasoned salt, table salt, and sea salt. Canned and packaged gravies. Worcestershire sauce. Tartar sauce. Barbecue sauce. Teriyaki sauce. Soy sauce, including reduced-sodium. Steak sauce. Fish sauce. Oyster sauce. Cocktail sauce. Horseradish that you find on the shelf. Regular ketchup and mustard. Meat flavorings and tenderizers. Bouillon cubes. Hot sauce and Tabasco  sauce. Premade or packaged marinades. Premade or packaged taco seasonings. Relishes. Regular salad dressings. Salsa. Potato and tortilla chips. Corn chips and puffs. Salted popcorn and pretzels. Canned or dried soups. Pizza. Frozen entrees and pot pies. Summary  Eating less sodium can help lower your blood pressure, reduce swelling, and protect your heart, liver, and kidneys.  Most people on this plan should limit their sodium intake to 1,500-2,000 mg (milligrams) of sodium each day.  Canned, boxed, and frozen foods are high in sodium. Restaurant foods, fast foods, and pizza are also very high in sodium. You also get sodium by adding salt to food.  Try to cook at home, eat more fresh fruits and vegetables, and eat less fast food, canned, processed, or prepared foods. This information is not intended to replace advice given to you by your health care provider. Make sure you discuss any questions you have with your health care provider. Document Released: 11/01/2001 Document Revised: 05/05/2016 Document Reviewed: 05/05/2016 Elsevier Interactive Patient Education  [employer].

## 2017-06-11 ENCOUNTER — Other Ambulatory Visit: Payer: Medicare Other | Admitting: *Deleted

## 2017-06-11 ENCOUNTER — Telehealth: Payer: Self-pay | Admitting: *Deleted

## 2017-06-11 ENCOUNTER — Ambulatory Visit (HOSPITAL_COMMUNITY): Payer: Medicare Other | Attending: Physician Assistant

## 2017-06-11 ENCOUNTER — Other Ambulatory Visit: Payer: Self-pay

## 2017-06-11 DIAGNOSIS — I051 Rheumatic mitral insufficiency: Secondary | ICD-10-CM | POA: Insufficient documentation

## 2017-06-11 DIAGNOSIS — N183 Chronic kidney disease, stage 3 unspecified: Secondary | ICD-10-CM

## 2017-06-11 DIAGNOSIS — I5032 Chronic diastolic (congestive) heart failure: Secondary | ICD-10-CM

## 2017-06-11 DIAGNOSIS — I420 Dilated cardiomyopathy: Secondary | ICD-10-CM | POA: Diagnosis not present

## 2017-06-11 LAB — BASIC METABOLIC PANEL
BUN/Creatinine Ratio: 13 (ref 12–28)
BUN: 13 mg/dL (ref 10–36)
CALCIUM: 9.3 mg/dL (ref 8.7–10.3)
CHLORIDE: 106 mmol/L (ref 96–106)
CO2: 24 mmol/L (ref 20–29)
CREATININE: 1 mg/dL (ref 0.57–1.00)
GFR calc Af Amer: 57 mL/min/{1.73_m2} — ABNORMAL LOW (ref >59)
GFR calc non Af Amer: 49 mL/min/{1.73_m2} — ABNORMAL LOW (ref >59)
GLUCOSE: 137 mg/dL — AB (ref 65–99)
Potassium: 3 mmol/L — ABNORMAL LOW (ref 3.5–5.2)
Sodium: 148 mmol/L — ABNORMAL HIGH (ref 134–144)

## 2017-06-11 MED ORDER — POTASSIUM CHLORIDE CRYS ER 20 MEQ PO TBCR: 20.0000 meq | EXTENDED_RELEASE_TABLET | Freq: Two times a day (BID) | ORAL | 3 refills | Status: DC

## 2017-06-11 NOTE — Telephone Encounter (Signed)
Pt has been notified of lab results by phone. When I instructed pt to increase her potassium 20 meq to BID, pt states she has only been taking a 1/2 tab of K+ daily. I reviewed [name], PA last ov and PCP last ov for clarification on K+ dose. I read to the pt in Dr. Buelah Manis ov note 05/14/17 where Dr. Buelah Manis states pt is to take a whole tablet of potassium daily when she takes her lasix. Pt advised to increase K+ 20 meq to BID due to K+ is too low. BMET 06/19/17. Pt is agreeable to plan of care with verbal read back from the pt on instructions for her K+ dose. Pt thanked me for my call.

## 2017-06-11 NOTE — Telephone Encounter (Signed)
-----   Message from Liliane Shi, [place] sent at 06/11/2017  4:26 PM EST ----- Please call the patient Creatinine improved (in normal range now). The potassium is low. PLAN:  1. Increase K+ to 20 mEq Twice daily  2. BMET 1 week. [name], PA-C    06/11/2017 4:25 PM

## 2017-06-12 ENCOUNTER — Encounter: Payer: Self-pay | Admitting: Physician Assistant

## 2017-06-12 ENCOUNTER — Telehealth: Payer: Self-pay | Admitting: *Deleted

## 2017-06-12 NOTE — Telephone Encounter (Signed)
-----   Message from Liliane Shi, [place] sent at 06/12/2017 12:35 PM EST ----- Please call the patient. The echocardiogram shows normal ejection fraction.  There is only mild leakage of the mitral valve.  The pulmonary pressures are moderately elevated but this seems to be similar to findings from the echo in 2014.  Overall, findings appear stable.  Continue current medications and follow up as planned.  I will forward to Sinclair Grooms, MD as well for FYI.  Please fax a copy of this study result to her PCP:  Alycia Rossetti, MD  Thanks! [name], PA-C    06/12/2017 12:27 PM

## 2017-06-12 NOTE — Telephone Encounter (Signed)
Pt has been notified of echo results and findings by phone with verbal understanding. Pt thanked me for call.

## 2017-06-19 ENCOUNTER — Telehealth: Payer: Self-pay | Admitting: *Deleted

## 2017-06-19 ENCOUNTER — Other Ambulatory Visit: Payer: Medicare Other | Admitting: *Deleted

## 2017-06-19 DIAGNOSIS — I5032 Chronic diastolic (congestive) heart failure: Secondary | ICD-10-CM

## 2017-06-19 LAB — BASIC METABOLIC PANEL
BUN/Creatinine Ratio: 22 (ref 12–28)
BUN: 40 mg/dL — AB (ref 10–36)
CO2: 19 mmol/L — ABNORMAL LOW (ref 20–29)
CREATININE: 1.82 mg/dL — AB (ref 0.57–1.00)
Calcium: 9.5 mg/dL (ref 8.7–10.3)
Chloride: 107 mmol/L — ABNORMAL HIGH (ref 96–106)
GFR, EST AFRICAN AMERICAN: 28 mL/min/{1.73_m2} — AB (ref >59)
GFR, EST NON AFRICAN AMERICAN: 24 mL/min/{1.73_m2} — AB (ref >59)
Glucose: 120 mg/dL — ABNORMAL HIGH (ref 65–99)
Potassium: 5.4 mmol/L — ABNORMAL HIGH (ref 3.5–5.2)
Sodium: 141 mmol/L (ref 134–144)

## 2017-06-19 MED ORDER — POTASSIUM CHLORIDE CRYS ER 20 MEQ PO TBCR: 20.0000 meq | EXTENDED_RELEASE_TABLET | Freq: Every day | ORAL | 3 refills | Status: DC

## 2017-06-19 NOTE — Telephone Encounter (Signed)
-----   Message from Liliane Shi, [place] sent at 06/19/2017  5:12 PM EST ----- Please call the patient Creatinine increased since last check.  However, when compared to historical values, it is fairly stable.  Potassium is now elevated.   PLAN: 1.  Decrease potassium to 20  mEq daily 2.  Repeat BMET 5 days [name], [place] 06/19/2017 5:11 PM

## 2017-06-19 NOTE — Telephone Encounter (Signed)
Pt has been notified of lab results by phone with verbal understanding. Pt agreeable to decrease K+ to 20 meq once a day. BMET 06/25/17. Pt thanked me for my call.

## 2017-06-25 ENCOUNTER — Telehealth: Payer: Self-pay

## 2017-06-25 ENCOUNTER — Other Ambulatory Visit: Payer: Medicare Other | Admitting: *Deleted

## 2017-06-25 DIAGNOSIS — I5032 Chronic diastolic (congestive) heart failure: Secondary | ICD-10-CM | POA: Diagnosis not present

## 2017-06-25 DIAGNOSIS — E875 Hyperkalemia: Secondary | ICD-10-CM

## 2017-06-25 LAB — BASIC METABOLIC PANEL
BUN / CREAT RATIO: 23 (ref 12–28)
BUN: 48 mg/dL — AB (ref 10–36)
CHLORIDE: 109 mmol/L — AB (ref 96–106)
CO2: 15 mmol/L — ABNORMAL LOW (ref 20–29)
Calcium: 9.6 mg/dL (ref 8.7–10.3)
Creatinine, Ser: 2.1 mg/dL — ABNORMAL HIGH (ref 0.57–1.00)
GFR calc non Af Amer: 20 mL/min/{1.73_m2} — ABNORMAL LOW (ref >59)
GFR, EST AFRICAN AMERICAN: 23 mL/min/{1.73_m2} — AB (ref >59)
GLUCOSE: 128 mg/dL — AB (ref 65–99)
Potassium: 5.2 mmol/L (ref 3.5–5.2)
SODIUM: 141 mmol/L (ref 134–144)

## 2017-06-25 MED ORDER — POTASSIUM CHLORIDE CRYS ER 20 MEQ PO TBCR: 20.0000 meq | EXTENDED_RELEASE_TABLET | ORAL | 3 refills | Status: DC

## 2017-06-25 MED ORDER — FUROSEMIDE 20 MG PO TABS: ORAL_TABLET | ORAL | 3 refills | Status: DC

## 2017-06-25 NOTE — Telephone Encounter (Signed)
Spoke with patient's daughter and gave her the following instructions per [name] PA.  Take Potassium every other day and take Lasix 40 mg Monday, Wednesday and Friday and 20 mg the other days.   A BMET will be done on f/u with Dr. Tamala Julian.

## 2017-06-28 NOTE — Progress Notes (Signed)
Cardiology Office Note    Date:  06/29/2017   ID:  [name], DOB 28-Mar-1924, MRN [id number]  PCP:  Alycia Rossetti, MD  Cardiologist: Sinclair Grooms, MD   Chief Complaint  Patient presents with  . Shortness of Breath    History of Present Illness:  [name] is a 84 y.o. female with a hx of diastolic heart failure, diabetes, chronic kidney disease, hypertension, hyperlipidemia, prior stroke, spinal stenosis, bradycardia.   Has been seen by [name] recently.  Orthopnea and lower extremity edema led to increase in furosemide to 40 mg/day.  Laboratory data tracking electrolytes rates worsening creatinine Friday.  He still complains of some orthopnea breathing is fine when she is sitting trace lower extremity edema.  Breathing is slightly better since recent furosemide dose   Past Medical History:  Diagnosis Date  . Arthritis   . Bilateral carpal tunnel syndrome   . Cataracts, both eyes   . Chronic diastolic CHF (congestive heart failure) ([place])    a. Echo (09/2012): Mild LVH, EF 65-70%, moderately elevated RVSP, moderate TR, mild to moderate MR, mild LAE, grade 2 diastolic dysfunction  . Diabetes mellitus [facility]  . Diabetic neuropathy (HCC) both hands and legs  . GERD (gastroesophageal reflux disease)   . Glaucoma   . Gout   . History of echocardiogram    Echo 1/19:  EF 65-70, no RWMA, Gr 2 DD, mild MR, severe LAE, PASP 62   . History of falling RECENT FALL 1 WK AGO--   NO INJURY  . 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 History of transfusion   . Hyperlipidemia   . Hypertension   . Mitral regurgitation   . PUD (peptic ulcer disease)    many yrs ago  . Right leg weakness   . Rotator cuff tear    left  . Slipped intervertebral disc   . Walker as ambulation aid     Past Surgical History:  Procedure Laterality Date  . ABDOMINAL HYSTERECTOMY  1966  . APPENDECTOMY    . CARDIOVASCULAR STRESS TEST  06-18-2005   DR Daneen Schick   NORMAL STUDY/ NO EVIDENCE ISCHEMIA/ EF 75%  . CARPAL TUNNEL RELEASE  09/17/2011   Procedure: CARPAL TUNNEL RELEASE;  Surgeon: Magnus Sinning, MD;  Location: [facility];  Service: Orthopedics;  Laterality: Right;  . CARPAL TUNNEL RELEASE  10/22/2011   Procedure: CARPAL TUNNEL RELEASE;  Surgeon: Magnus Sinning, MD;  Location: [place];  Service: Orthopedics;  Laterality: Left;  . EYE SURGERY    . LUMBAR LAMINECTOMY/DECOMPRESSION MICRODISCECTOMY  01/14/2012   Procedure: LUMBAR LAMINECTOMY/DECOMPRESSION MICRODISCECTOMY 3 LEVELS;  Surgeon: Magnus Sinning, MD;  Location: [facility];  Service: Orthopedics;  Laterality: N/A;  Decompression Laminectomy of L2 - L3, L3 - L4 and L4 - L5 Central  (X-Ray)  . SHOULDER OPEN ROTATOR CUFF REPAIR  06/02/2012   Procedure: ROTATOR CUFF REPAIR SHOULDER OPEN;  Surgeon: Magnus Sinning, MD;  Location: [facility];  Service: Orthopedics;  Laterality: Left;  Left Shoulder Open Distal Clavicle Resection Anterior Acrominectomy and Rotator Cuff Repair  . SPINE SURGERY    . TRANSTHORACIC ECHOCARDIOGRAM  12-26-2008  DR Daneen Schick   NORMAL LVF/  EF  71%/   MILD ASYMMETRIC SEPTAL HYPERTROPHY/ MILD LEFT ATRIAL ENLARGEMENT/ MODERATELY ELEVATED ESTIMATED RIGHT VENTRICULAR SYSTOLIC PRESSURE/ MILD MITRAL  &  TRICUSPID VALVE REGURG.  Current Medications: Outpatient Medications Prior to Visit  Medication Sig Dispense Refill  . acetaminophen (TYLENOL) 500 MG tablet Take 1,500 mg by mouth every 6 (six) hours as needed for mild pain.    Marland Kitchen allopurinol (ZYLOPRIM) 300 MG tablet TAKE 1 TABLET BY MOUTH  DAILY WITH BREAKFAST 90 tablet 1  . amLODipine (NORVASC) 5 MG tablet Take 1 tablet (5 mg total) by mouth daily. 90 tablet 1  . aspirin EC 81 MG tablet Take 81 mg by mouth daily.    . diclofenac sodium (VOLTAREN) 1 % GEL Apply 2 g topically 4 (four) times daily. 300 g 3  . dorzolamide-timolol (COSOPT) 22.3-6.8 MG/ML ophthalmic solution Place 1 drop into the  right eye 2 (two) times daily.    Marland Kitchen esomeprazole (NEXIUM) 40 MG capsule Take 1 capsule (40 mg total) by mouth daily. 90 capsule 2  . furosemide (LASIX) 20 MG tablet Take Lasix 40 mg by mouth on Monday, Wednesday, and Friday..  20 mg by mouth the other days (Saturday, Sunday, Tuesday and Thursday) 40 tablet 3  . gabapentin (NEURONTIN) 100 MG capsule TAKE 1 CAPSULE BY MOUTH AT  BEDTIME 90 capsule 3  . hydrALAZINE (APRESOLINE) 10 MG tablet TAKE 1 TABLET BY MOUTH TWO  TIMES DAILY 180 tablet 1  . latanoprost (XALATAN) 0.005 % ophthalmic solution Place 1 drop into the right eye 2 (two) times daily.     Marland Kitchen lisinopril (PRINIVIL,ZESTRIL) 40 MG tablet TAKE 1 TABLET BY MOUTH  EVERY MORNING 90 tablet 1  . meloxicam (MOBIC) 15 MG tablet TAKE 1 TABLET BY MOUTH  DAILY 90 tablet 1  . pantoprazole (PROTONIX) 40 MG tablet TAKE 1 TABLET BY MOUTH  DAILY 90 tablet 1  . potassium chloride SA (K-DUR,KLOR-CON) 20 MEQ tablet Take 1 tablet (20 mEq total) by mouth every other day. 90 tablet 3  . pravastatin (PRAVACHOL) 20 MG tablet TAKE 1 TABLET BY MOUTH  EVERY EVENING 90 tablet 1  . ranitidine (ZANTAC) 150 MG tablet TAKE 1 TABLET BY MOUTH AT  BEDTIME 90 tablet 1  . timolol (BETIMOL) 0.5 % ophthalmic solution Place 1 drop into the left eye daily.     No facility-administered medications prior to visit.      Allergies:   Patient has no known allergies.   Social History   Socioeconomic History  . Marital status: Widowed    Spouse name: None  . Number of children: None  . Years of education: None  . Highest education level: None  Social Needs  . Financial resource strain: None  . Food insecurity - worry: None  . Food insecurity - inability: None  . Transportation needs - medical: None  . Transportation needs - non-medical: None  Occupational History  . None  Tobacco Use  . Smoking status: Former Smoker    Packs/day: 1.00    Years: 30.00    Pack years: 30.00    Types: Cigarettes    Last attempt to quit:  05/28/1968    Years since quitting: 49.1  . Smokeless tobacco: Never Used  Substance and Sexual Activity  . Alcohol use: No  . Drug use: No  . Sexual activity: None  Other Topics Concern  . None  Social History Narrative  . None     Family History:  The patient's family history includes Alcohol abuse in her brother; Arthritis in her sister, sister, sister, sister, and sister; Cancer in her sister; Diabetes in her sister; Heart attack in her son; Hypertension in her brother,  father, mother, sister, sister, and sister; Stroke in her mother.   ROS:   Please see the history of present illness.    Awakens frequently from sleep.  Has nocturia. All other systems reviewed and are negative.   PHYSICAL EXAM:   VS:  BP 126/62   Pulse 80   Ht 5\' 5"  (1.651 m)   Wt 183 lb 6.4 oz (83.2 kg)   BMI 30.52 kg/m    GEN: Well nourished, well developed, in no acute distress  HEENT: normal  Neck: no JVD, carotid bruits, or masses Cardiac: RRR; no murmurs, rubs, or gallops,no edema  Respiratory:  clear to auscultation bilaterally, normal work of breathing GI: soft, nontender, nondistended, + BS MS: no deformity or atrophy  Skin: warm and dry, no rash Neuro:  Alert and Oriented x 3, Strength and sensation are intact Psych: euthymic mood, full affect  Wt Readings from Last 3 Encounters:  06/29/17 183 lb 6.4 oz (83.2 kg)  06/03/17 185 lb 12.8 oz (84.3 kg)  05/14/17 183 lb 6.4 oz (83.2 kg)      Studies/Labs Reviewed:   EKG:  EKG not repeated.  EKG performed December 20 demonstrated normal sinus rhythm with prominent voltage.  Recent Labs: 05/14/2017: ALT 6; Brain Natriuretic Peptide 142; Hemoglobin 9.8; Platelets 248 06/25/2017: BUN 48; Creatinine, Ser 2.10; Potassium 5.2; Sodium 141   Lipid Panel    Component Value Date/Time   CHOL 145 01/13/2017 0825   TRIG 180 (H) 01/13/2017 0825   HDL 42 (L) 01/13/2017 0825   CHOLHDL 3.5 01/13/2017 0825   VLDL 36 (H) 01/13/2017 0825   LDLCALC 67  01/13/2017 0825    Additional studies/ records that were reviewed today include:  2D Doppler echocardiogram June 11, 2017: Study Conclusions   - Left ventricle: The cavity size was normal. Wall thickness was   normal. Systolic function was vigorous. The estimated ejection   fraction was in the range of 65% to 70%. Wall motion was normal;   there were no regional wall motion abnormalities. Features are   consistent with a pseudonormal left ventricular filling pattern,   with concomitant abnormal relaxation and increased filling   pressure (grade 2 diastolic dysfunction). - Mitral valve: There was mild regurgitation. - Left atrium: The atrium was severely dilated. - Pulmonary arteries: Systolic pressure was moderately increased.   PA peak pressure: 62 mm Hg (S).    ASSESSMENT:    1. Chronic diastolic CHF (congestive heart failure) ([place])   2. Essential hypertension   3. Type 2 diabetes mellitus without complication, without long-term current use of insulin ([place])   4. Gastroesophageal reflux disease without esophagitis   5. CKD (chronic kidney disease), stage III ([place])   6. Other hyperlipidemia   7. SOB (shortness of breath)      PLAN:  In order of problems listed above:  1. Increase in diuretic therapy is led to worsening kidney function with creatinine rising to 2.1 and potassium 5.2 on 06/25/2017.  Laboratory data will be obtained again today.  May need to completely discontinue her potassium (verify current dose) and may need to decrease furosemide to a lower dose.  PA and lateral chest x-ray today. 2. Blood pressure is currently adequate low for her age.  She should be in the 130-145/65-85 mmHg range.  Blood pressure is lower than that today. 3. Not discussed 4. Not discussed 5. Creatinine 2.1 on Friday.  Laboratory data is pending today. 6. Not addressed 7. PA and lateral chest x-ray.  BNP.  Adjust potassium and furosemide based upon lab work obtained today.  Follow-up  will be as needed based upon findings from today's blood work.    Medication Adjustments/Labs and Tests Ordered: Current medicines are reviewed at length with the patient today.  Concerns regarding medicines are outlined above.  Medication changes, Labs and Tests ordered today are listed in the Patient Instructions below. There are no Patient Instructions on file for this visit.   Signed, Sinclair Grooms, MD  06/29/2017 11:42 AM    [facility] [place], [place], [place]  [address] Phone: [phone]; Fax: [phone]

## 2017-06-29 ENCOUNTER — Encounter: Payer: Self-pay | Admitting: Interventional Cardiology

## 2017-06-29 ENCOUNTER — Other Ambulatory Visit: Payer: Self-pay | Admitting: *Deleted

## 2017-06-29 ENCOUNTER — Ambulatory Visit: Payer: Medicare Other | Admitting: Interventional Cardiology

## 2017-06-29 ENCOUNTER — Other Ambulatory Visit: Payer: Medicare Other | Admitting: *Deleted

## 2017-06-29 ENCOUNTER — Ambulatory Visit
Admission: RE | Admit: 2017-06-29 | Discharge: 2017-06-29 | Disposition: A | Discharge status: Home / Self Care | Payer: Medicare Other | Source: Ambulatory Visit | Attending: Interventional Cardiology | Admitting: Interventional Cardiology

## 2017-06-29 VITALS — BP 126/62 | HR 80 | Ht 65.0 in | Wt 183.4 lb

## 2017-06-29 DIAGNOSIS — I5032 Chronic diastolic (congestive) heart failure: Secondary | ICD-10-CM

## 2017-06-29 DIAGNOSIS — I10 Essential (primary) hypertension: Secondary | ICD-10-CM | POA: Diagnosis not present

## 2017-06-29 DIAGNOSIS — E875 Hyperkalemia: Secondary | ICD-10-CM

## 2017-06-29 DIAGNOSIS — R0602 Shortness of breath: Secondary | ICD-10-CM

## 2017-06-29 DIAGNOSIS — E7849 Other hyperlipidemia: Secondary | ICD-10-CM | POA: Diagnosis not present

## 2017-06-29 DIAGNOSIS — N183 Chronic kidney disease, stage 3 unspecified: Secondary | ICD-10-CM

## 2017-06-29 DIAGNOSIS — K219 Gastro-esophageal reflux disease without esophagitis: Secondary | ICD-10-CM | POA: Diagnosis not present

## 2017-06-29 DIAGNOSIS — E119 Type 2 diabetes mellitus without complications: Secondary | ICD-10-CM | POA: Diagnosis not present

## 2017-06-29 DIAGNOSIS — I509 Heart failure, unspecified: Secondary | ICD-10-CM | POA: Diagnosis not present

## 2017-06-29 LAB — BASIC METABOLIC PANEL
BUN / CREAT RATIO: 25 (ref 12–28)
BUN: 55 mg/dL — ABNORMAL HIGH (ref 10–36)
CO2: 17 mmol/L — ABNORMAL LOW (ref 20–29)
Calcium: 9.6 mg/dL (ref 8.7–10.3)
Chloride: 107 mmol/L — ABNORMAL HIGH (ref 96–106)
Creatinine, Ser: 2.19 mg/dL — ABNORMAL HIGH (ref 0.57–1.00)
GFR, EST AFRICAN AMERICAN: 22 mL/min/{1.73_m2} — AB (ref >59)
GFR, EST NON AFRICAN AMERICAN: 19 mL/min/{1.73_m2} — AB (ref >59)
Glucose: 109 mg/dL — ABNORMAL HIGH (ref 65–99)
POTASSIUM: 5.1 mmol/L (ref 3.5–5.2)
Sodium: 140 mmol/L (ref 134–144)

## 2017-06-29 LAB — PRO B NATRIURETIC PEPTIDE: NT-Pro BNP: 604 pg/mL (ref 0–738)

## 2017-06-29 NOTE — Patient Instructions (Signed)
Medication Instructions:  Your physician recommends that you continue on your current medications as directed. Please refer to the Current Medication list given to you today.  Labwork: BMET and Pro BNP today  Testing/Procedures: A chest x-ray takes a picture of the organs and structures inside the chest, including the heart, lungs, and blood vessels. This test can show several things, including, whether the heart is enlarges; whether fluid is building up in the lungs; and whether pacemaker / defibrillator leads are still in place.  Follow-Up: Your physician recommends that you schedule a follow-up appointment as needed with Dr. Tamala Julian.    Any Other Special Instructions Will Be Listed Below (If Applicable).     If you need a refill on your cardiac medications before your next appointment, please call your pharmacy.

## 2017-06-29 NOTE — Addendum Note (Signed)
Addended by: Loren Racer on: 06/29/2017 11:51 AM   Modules accepted: Orders

## 2017-07-02 ENCOUNTER — Telehealth: Payer: Self-pay | Admitting: *Deleted

## 2017-07-02 MED ORDER — FUROSEMIDE 40 MG PO TABS: 40.0000 mg | ORAL_TABLET | ORAL | 3 refills | Status: DC

## 2017-07-02 MED ORDER — POTASSIUM CHLORIDE CRYS ER 20 MEQ PO TBCR: 20.0000 meq | EXTENDED_RELEASE_TABLET | ORAL | 3 refills | Status: DC

## 2017-07-02 NOTE — Telephone Encounter (Signed)
Spoke with pt and went over lab results and recommendations per Dr. Tamala Julian.   Pt verbalized understanding and was in agreement with this plan.

## 2017-07-02 NOTE — Telephone Encounter (Signed)
-----   Message from Belva Crome, MD sent at 07/02/2017 11:46 AM EST ----- Decrease potassium to Monday Wednesday Friday schedule along with furosemide. ----- Message ----- From: Loren Racer, LPN Sent: 07/28/350  48:18 AM To: Belva Crome, MD  Spoke with pt and went over results and recommendations per Dr. Tamala Julian.  Pt verbalized understanding and was in agreement with this plan.  Pt asked about K+ since we are decreasing Lasix.  Pt currently taking K+ 4mEq QD.  Advised I will send to Dr. Tamala Julian for review.

## 2017-07-04 ENCOUNTER — Emergency Department (HOSPITAL_COMMUNITY): Payer: Medicare Other

## 2017-07-04 ENCOUNTER — Encounter (HOSPITAL_COMMUNITY): Payer: Self-pay | Admitting: Emergency Medicine

## 2017-07-04 ENCOUNTER — Emergency Department (HOSPITAL_COMMUNITY)
Admission: EM | Admit: 2017-07-04 | Discharge: 2017-07-04 | Disposition: A | Discharge status: Home / Self Care | Payer: Medicare Other | Attending: Emergency Medicine | Admitting: Emergency Medicine

## 2017-07-04 DIAGNOSIS — E860 Dehydration: Secondary | ICD-10-CM

## 2017-07-04 DIAGNOSIS — Z7982 Long term (current) use of aspirin: Secondary | ICD-10-CM | POA: Diagnosis not present

## 2017-07-04 DIAGNOSIS — I130 Hypertensive heart and chronic kidney disease with heart failure and stage 1 through stage 4 chronic kidney disease, or unspecified chronic kidney disease: Secondary | ICD-10-CM | POA: Insufficient documentation

## 2017-07-04 DIAGNOSIS — R748 Abnormal levels of other serum enzymes: Secondary | ICD-10-CM | POA: Diagnosis not present

## 2017-07-04 DIAGNOSIS — R197 Diarrhea, unspecified: Secondary | ICD-10-CM | POA: Diagnosis not present

## 2017-07-04 DIAGNOSIS — J101 Influenza due to other identified influenza virus with other respiratory manifestations: Secondary | ICD-10-CM | POA: Diagnosis not present

## 2017-07-04 DIAGNOSIS — R7989 Other specified abnormal findings of blood chemistry: Secondary | ICD-10-CM | POA: Insufficient documentation

## 2017-07-04 DIAGNOSIS — J111 Influenza due to unidentified influenza virus with other respiratory manifestations: Secondary | ICD-10-CM | POA: Diagnosis not present

## 2017-07-04 DIAGNOSIS — E1140 Type 2 diabetes mellitus with diabetic neuropathy, unspecified: Secondary | ICD-10-CM | POA: Insufficient documentation

## 2017-07-04 DIAGNOSIS — N183 Chronic kidney disease, stage 3 (moderate): Secondary | ICD-10-CM | POA: Insufficient documentation

## 2017-07-04 DIAGNOSIS — Z87891 Personal history of nicotine dependence: Secondary | ICD-10-CM | POA: Insufficient documentation

## 2017-07-04 DIAGNOSIS — R112 Nausea with vomiting, unspecified: Secondary | ICD-10-CM | POA: Insufficient documentation

## 2017-07-04 DIAGNOSIS — I5032 Chronic diastolic (congestive) heart failure: Secondary | ICD-10-CM | POA: Insufficient documentation

## 2017-07-04 DIAGNOSIS — N200 Calculus of kidney: Secondary | ICD-10-CM | POA: Diagnosis not present

## 2017-07-04 DIAGNOSIS — R778 Other specified abnormalities of plasma proteins: Secondary | ICD-10-CM

## 2017-07-04 LAB — TROPONIN I: Troponin I: 0.05 ng/mL (ref <0.03)

## 2017-07-04 LAB — CBC WITH DIFFERENTIAL/PLATELET
Basophils Absolute: 0 10*3/uL (ref 0.0–0.1)
Basophils Relative: 0 %
EOS PCT: 2 %
Eosinophils Absolute: 0.2 10*3/uL (ref 0.0–0.7)
HCT: 33.7 % — ABNORMAL LOW (ref 36.0–46.0)
Hemoglobin: 10.4 g/dL — ABNORMAL LOW (ref 12.0–15.0)
LYMPHS ABS: 0.5 10*3/uL — AB (ref 0.7–4.0)
Lymphocytes Relative: 6 %
MCH: 29.7 pg (ref 26.0–34.0)
MCHC: 30.9 g/dL (ref 30.0–36.0)
MCV: 96.3 fL (ref 78.0–100.0)
MONO ABS: 0.2 10*3/uL (ref 0.1–1.0)
MONOS PCT: 2 %
Neutro Abs: 7 10*3/uL (ref 1.7–7.7)
Neutrophils Relative %: 90 %
PLATELETS: 224 10*3/uL (ref 150–400)
RBC: 3.5 MIL/uL — AB (ref 3.87–5.11)
RDW: 16.6 % — ABNORMAL HIGH (ref 11.5–15.5)
WBC: 7.8 10*3/uL (ref 4.0–10.5)

## 2017-07-04 LAB — URINALYSIS, ROUTINE W REFLEX MICROSCOPIC
BILIRUBIN URINE: NEGATIVE
Glucose, UA: NEGATIVE mg/dL
Hgb urine dipstick: NEGATIVE
Ketones, ur: NEGATIVE mg/dL
Leukocytes, UA: NEGATIVE
NITRITE: NEGATIVE
PH: 5 (ref 5.0–8.0)
Protein, ur: NEGATIVE mg/dL
SPECIFIC GRAVITY, URINE: 1.012 (ref 1.005–1.030)

## 2017-07-04 LAB — COMPREHENSIVE METABOLIC PANEL
ALBUMIN: 4.5 g/dL (ref 3.5–5.0)
ALK PHOS: 177 U/L — AB (ref 38–126)
ALT: 10 U/L — AB (ref 14–54)
ANION GAP: 13 (ref 5–15)
AST: 18 U/L (ref 15–41)
BILIRUBIN TOTAL: 0.2 mg/dL — AB (ref 0.3–1.2)
BUN: 39 mg/dL — AB (ref 6–20)
CALCIUM: 9.7 mg/dL (ref 8.9–10.3)
CO2: 19 mmol/L — ABNORMAL LOW (ref 22–32)
CREATININE: 1.65 mg/dL — AB (ref 0.44–1.00)
Chloride: 111 mmol/L (ref 101–111)
GFR calc Af Amer: 30 mL/min — ABNORMAL LOW (ref >60)
GFR calc non Af Amer: 26 mL/min — ABNORMAL LOW (ref >60)
GLUCOSE: 167 mg/dL — AB (ref 65–99)
Potassium: 4.7 mmol/L (ref 3.5–5.1)
SODIUM: 143 mmol/L (ref 135–145)
Total Protein: 7.8 g/dL (ref 6.5–8.1)

## 2017-07-04 LAB — INFLUENZA PANEL BY PCR (TYPE A & B)
Influenza A By PCR: POSITIVE — AB
Influenza B By PCR: NEGATIVE

## 2017-07-04 LAB — RAPID STREP SCREEN (MED CTR MEBANE ONLY): STREPTOCOCCUS, GROUP A SCREEN (DIRECT): NEGATIVE

## 2017-07-04 LAB — LIPASE, BLOOD: Lipase: 26 U/L (ref 11–51)

## 2017-07-04 MED ORDER — OSELTAMIVIR PHOSPHATE 75 MG PO CAPS: 75.0000 mg | ORAL_CAPSULE | Freq: Two times a day (BID) | ORAL | 0 refills | Status: AC

## 2017-07-04 MED ORDER — SODIUM CHLORIDE 0.9 % IV BOLUS (SEPSIS): 250.0000 mL | Freq: Once | INTRAVENOUS | Status: DC

## 2017-07-04 MED ORDER — SODIUM CHLORIDE 0.9 % IV BOLUS (SEPSIS)
250.0000 mL | Freq: Once | INTRAVENOUS | Status: AC
  Administered 2017-07-04: 250 mL via INTRAVENOUS

## 2017-07-04 MED ORDER — OSELTAMIVIR PHOSPHATE 75 MG PO CAPS
75.0000 mg | ORAL_CAPSULE | Freq: Once | ORAL | Status: AC
  Administered 2017-07-04: 75 mg via ORAL
  Filled 2017-07-04: qty 1

## 2017-07-04 MED ORDER — SODIUM CHLORIDE 0.9 % IV SOLN
INTRAVENOUS | Status: DC
  Administered 2017-07-04: 09:00:00 via INTRAVENOUS

## 2017-07-04 NOTE — ED Provider Notes (Signed)
[facility] EMERGENCY DEPARTMENT Provider Note   CSN: [id number] Arrival date & time: 07/04/17  0750     History   Chief Complaint Chief Complaint  Patient presents with  . Emesis  . Diarrhea    HPI [name] is a 84 y.o. female.  HPI  Pt was seen at [place].  Per pt and her family, c/o gradual onset and persistence of constant sore throat, runny/stuffy nose, sinus congestion, and cough for the past 2 to 3 days. Has been associated with multiple intermittent episodes of N/V/D since yesterday. Multiple others in household have similar symptoms, dx "flu." Pt states she did receive her flu shot this season.  Denies fevers, no rash, no CP/SOB, no back pain, no abd pain, no black or blood in stools or emesis.    Past Medical History:  Diagnosis Date  . Arthritis   . Bilateral carpal tunnel syndrome   . Cataracts, both eyes   . Chronic diastolic CHF (congestive heart failure) ([place])    a. Echo (09/2012): Mild LVH, EF 65-70%, moderately elevated RVSP, moderate TR, mild to moderate MR, mild LAE, grade 2 diastolic dysfunction  . Diabetes mellitus [facility]  . Diabetic neuropathy (HCC) both hands and legs  . GERD (gastroesophageal reflux disease)   . Glaucoma   . Gout   . History of echocardiogram    Echo 1/19:  EF 65-70, no RWMA, Gr 2 DD, mild MR, severe LAE, PASP 62   . History of falling RECENT FALL 1 WK AGO--   NO INJURY  . 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 History of transfusion   . Hyperlipidemia   . Hypertension   . Mitral regurgitation   . PUD (peptic ulcer disease)    many yrs ago  . Right leg weakness   . Rotator cuff tear    left  . Slipped intervertebral disc   . Walker as ambulation aid     Patient Active Problem List   Diagnosis Date Noted  . Dysphagia 07/28/2016  . Peripheral edema 07/02/2016  . CKD (chronic kidney disease), stage III ([place]) 05/30/2015  . GERD (gastroesophageal reflux disease) 05/30/2015  . Peripheral neuropathy  03/19/2015  . Diabetes mellitus, type II ([place]) 02/05/2015  . Spinal stenosis of lumbar region 02/05/2015  . Chronic diastolic CHF (congestive heart failure) ([place]) 05/01/2014  . Essential hypertension 05/01/2014  . Hyperlipidemia 05/01/2014    Past Surgical History:  Procedure Laterality Date  . ABDOMINAL HYSTERECTOMY  1966  . APPENDECTOMY    . CARDIOVASCULAR STRESS TEST  06-18-2005   DR Daneen Schick   NORMAL STUDY/ NO EVIDENCE ISCHEMIA/ EF 75%  . CARPAL TUNNEL RELEASE  09/17/2011   Procedure: CARPAL TUNNEL RELEASE;  Surgeon: Magnus Sinning, MD;  Location: [place];  Service: Orthopedics;  Laterality: Right;  . CARPAL TUNNEL RELEASE  10/22/2011   Procedure: CARPAL TUNNEL RELEASE;  Surgeon: Magnus Sinning, MD;  Location: [place];  Service: Orthopedics;  Laterality: Left;  . EYE SURGERY    . LUMBAR LAMINECTOMY/DECOMPRESSION MICRODISCECTOMY  01/14/2012   Procedure: LUMBAR LAMINECTOMY/DECOMPRESSION MICRODISCECTOMY 3 LEVELS;  Surgeon: Magnus Sinning, MD;  Location: [facility];  Service: Orthopedics;  Laterality: N/A;  Decompression Laminectomy of L2 - L3, L3 - L4 and L4 - L5 Central  (X-Ray)  . SHOULDER OPEN ROTATOR CUFF REPAIR  06/02/2012   Procedure: ROTATOR CUFF REPAIR SHOULDER OPEN;  Surgeon: Laurice Record [place],  MD;  Location: [facility];  Service: Orthopedics;  Laterality: Left;  Left Shoulder Open Distal Clavicle Resection Anterior Acrominectomy and Rotator Cuff Repair  . SPINE SURGERY    . TRANSTHORACIC ECHOCARDIOGRAM  12-26-2008  DR Daneen Schick   NORMAL LVF/  EF  71%/   MILD ASYMMETRIC SEPTAL HYPERTROPHY/ MILD LEFT ATRIAL ENLARGEMENT/ MODERATELY ELEVATED ESTIMATED RIGHT VENTRICULAR SYSTOLIC PRESSURE/ MILD MITRAL  &  TRICUSPID VALVE REGURG.    OB History    No data available       Home Medications    Prior to Admission medications   Medication Sig Start Date End Date Taking? Authorizing Provider  acetaminophen (TYLENOL) 500 MG tablet Take 1,500 mg  by mouth every 6 (six) hours as needed for mild pain.    [provider]  allopurinol (ZYLOPRIM) 300 MG tablet TAKE 1 TABLET BY MOUTH  DAILY WITH BREAKFAST 02/25/17   [place], Modena Nunnery, MD  amLODipine (NORVASC) 5 MG tablet Take 1 tablet (5 mg total) by mouth daily. 01/15/17   Alycia Rossetti, MD  aspirin EC 81 MG tablet Take 81 mg by mouth daily.    [provider]  diclofenac sodium (VOLTAREN) 1 % GEL Apply 2 g topically 4 (four) times daily. 05/22/17   Alycia Rossetti, MD  dorzolamide-timolol (COSOPT) 22.3-6.8 MG/ML ophthalmic solution Place 1 drop into the right eye 2 (two) times daily.    [provider]  esomeprazole (NEXIUM) 40 MG capsule Take 1 capsule (40 mg total) by mouth daily. 05/14/17   Alycia Rossetti, MD  furosemide (LASIX) 40 MG tablet Take 1 tablet (40 mg total) by mouth every Monday, Wednesday, and Friday. 07/03/17 08/27/18  Belva Crome, MD  gabapentin (NEURONTIN) 100 MG capsule TAKE 1 CAPSULE BY MOUTH AT  BEDTIME 07/29/16   [place], Modena Nunnery, MD  hydrALAZINE (APRESOLINE) 10 MG tablet TAKE 1 TABLET BY MOUTH TWO  TIMES DAILY 03/17/17   [place], Modena Nunnery, MD  latanoprost (XALATAN) 0.005 % ophthalmic solution Place 1 drop into the right eye 2 (two) times daily.  10/16/15   [provider]  lisinopril (PRINIVIL,ZESTRIL) 40 MG tablet TAKE 1 TABLET BY MOUTH  EVERY MORNING 02/25/17   [place], Modena Nunnery, MD  meloxicam [facility]) 15 MG tablet TAKE 1 TABLET BY MOUTH  DAILY 02/25/17   Alycia Rossetti, MD  pantoprazole (PROTONIX) 40 MG tablet TAKE 1 TABLET BY MOUTH  DAILY 02/25/17   Alycia Rossetti, MD  potassium chloride SA (K-DUR,KLOR-CON) 20 MEQ tablet Take 1 tablet (20 mEq total) by mouth every Monday, Wednesday, and Friday. 07/03/17   Belva Crome, MD  pravastatin (PRAVACHOL) 20 MG tablet TAKE 1 TABLET BY MOUTH  EVERY EVENING 05/06/17   [place], Modena Nunnery, MD  ranitidine (ZANTAC) 150 MG tablet TAKE 1 TABLET BY MOUTH AT  BEDTIME 02/25/17   , Modena Nunnery,  MD  timolol (BETIMOL) 0.5 % ophthalmic solution Place 1 drop into the left eye daily.    [provider]    Family History Family History  Problem Relation Age of Onset  . Hypertension Father   . Hypertension Mother   . Stroke Mother   . Diabetes Sister   . Hypertension Sister   . Cancer Sister   . Arthritis Sister   . Hypertension Brother   . Alcohol abuse Brother   . Hypertension Sister   . Arthritis Sister   . Hypertension Sister   . Arthritis Sister   . Arthritis Sister   .  Arthritis Sister   . Heart attack Son   . Colon cancer Neg Hx     Social History Social History   Tobacco Use  . Smoking status: Former Smoker    Packs/day: 1.00    Years: 30.00    Pack years: 30.00    Types: Cigarettes    Last attempt to quit: 05/28/1968    Years since quitting: 49.1  . Smokeless tobacco: Never Used  Substance Use Topics  . Alcohol use: No  . Drug use: No     Allergies   Patient has no known allergies.   Review of Systems Review of Systems ROS: Statement: All systems negative except as marked or noted in the HPI; Constitutional: Negative for fever and chills. ; ; Eyes: Negative for eye pain, redness and discharge. ; ; ENMT: Negative for ear pain, hoarseness, +nasal congestion, sinus pressure and sore throat. ; ; Cardiovascular: Negative for chest pain, palpitations, diaphoresis, dyspnea and peripheral edema. ; ; Respiratory: +cough. Negative for wheezing and stridor. ; ; Gastrointestinal: +N/V/D. Negative for abdominal pain, blood in stool, hematemesis, jaundice and rectal bleeding. . ; ; Genitourinary: Negative for dysuria, flank pain and hematuria. ; ; Musculoskeletal: Negative for back pain and neck pain. Negative for swelling and trauma.; ; Skin: Negative for pruritus, rash, abrasions, blisters, bruising and skin lesion.; ; Neuro: Negative for headache, lightheadedness and neck stiffness. Negative for weakness, altered level of consciousness, altered mental status,  extremity weakness, paresthesias, involuntary movement, seizure and syncope.      Physical Exam Updated Vital Signs BP 134/62 (BP Location: Right Arm)   Pulse 95   Temp 98.2 F (36.8 C) (Oral)   Resp 18   Ht 5\' 5"  (1.651 m)   Wt 83 kg (183 lb)   SpO2 98%   BMI 30.45 kg/m    08:17 Orthostatic Vital Signs LA  Orthostatic Lying   BP- Lying: 134/62  Pulse- Lying: 98      Orthostatic Sitting  BP- Sitting: 147/66  Pulse- Sitting: 106      Orthostatic Standing at 0 minutes  BP- Standing at 0 minutes: 163/72  Pulse- Standing at 0 minutes: 110     Physical Exam 0810: Physical examination:  Nursing notes reviewed; Vital signs and O2 SAT reviewed;  Constitutional: Well developed, Well nourished, In no acute distress; Head:  Normocephalic, atraumatic; Eyes: EOMI, PERRL, No scleral icterus; ENMT: TM's clear bilat. +edemetous nasal turbinates bilat with clear rhinorrhea. Mouth and pharynx without lesions. No tonsillar exudates. No intra-oral edema. No submandibular or sublingual edema. No hoarse voice, no drooling, no stridor. No pain with manipulation of larynx. No trismus. Mouth and pharynx normal, Mucous membranes dry; Neck: Supple, Full range of motion, No lymphadenopathy; Cardiovascular: Regular rate and rhythm, No gallop; Respiratory: Breath sounds clear & equal bilaterally, No wheezes.  Speaking full sentences with ease, Normal respiratory effort/excursion; Chest: Nontender, Movement normal; Abdomen: Soft, Nontender, Nondistended, Normal bowel sounds; Genitourinary: No CVA tenderness; Extremities: Pulses normal, No tenderness, No edema, No calf edema or asymmetry.; Neuro: AA&Ox3, Major CN grossly intact.  Speech clear. No gross focal motor or sensory deficits in extremities.; Skin: Color normal, Warm, Dry.   ED Treatments / Results  Labs (all labs ordered are listed, but only abnormal results are displayed)   EKG  EKG Interpretation  Date/Time:  Saturday July 04 2017  08:26:44 EST Ventricular Rate:  92 PR Interval:    QRS Duration: 89 QT Interval:  382 QTC Calculation: 473 R Axis:  81 Text Interpretation:  Normal sinus rhythm Borderline right axis deviation Repol abnrm suggests ischemia, diffuse leads Baseline wander Artifact When compared with ECG of 05/14/2017 no obvious changes suggest repeat tracing Reconfirmed by Francine Graven [phone]) on 07/04/2017 9:35:12 AM       EKG Interpretation  Date/Time:  Saturday July 04 2017 09:41:24 EST Ventricular Rate:  92 PR Interval:    QRS Duration: 84 QT Interval:  357 QTC Calculation: 442 R Axis:   82 Text Interpretation:  Sinus rhythm Ventricular premature complex Borderline right axis deviation Repol abnrm suggests ischemia, diffuse leads When compared with ECG of 05/14/2017 No significant change was found Confirmed by Francine Graven [id number]) on 07/04/2017 10:04:33 AM         Radiology   Procedures Procedures (including critical care time)  Medications Ordered in ED Medications  0.9 %  sodium chloride infusion ( Intravenous New Bag/Given 07/04/17 0835)  sodium chloride 0.9 % bolus 250 mL (not administered)     Initial Impression / Assessment and Plan / ED Course  I have reviewed the triage vital signs and the nursing notes.  Pertinent labs & imaging results that were available during my care of the patient were reviewed by me and considered in my medical decision making (see chart for details).  MDM Reviewed: previous chart, nursing note and vitals Reviewed previous: labs and ECG Interpretation: labs, ECG and x-ray   Results for orders placed or performed during the hospital encounter of 07/04/17  Rapid strep screen  Result Value Ref Range   Streptococcus, Group A Screen (Direct) NEGATIVE NEGATIVE  Urinalysis, Routine w reflex microscopic  Result Value Ref Range   Color, Urine STRAW (A) YELLOW   APPearance CLEAR CLEAR   Specific Gravity, Urine 1.012 1.005 - 1.030   pH 5.0  5.0 - 8.0   Glucose, UA NEGATIVE NEGATIVE mg/dL   Hgb urine dipstick NEGATIVE NEGATIVE   Bilirubin Urine NEGATIVE NEGATIVE   Ketones, ur NEGATIVE NEGATIVE mg/dL   Protein, ur NEGATIVE NEGATIVE mg/dL   Nitrite NEGATIVE NEGATIVE   Leukocytes, UA NEGATIVE NEGATIVE  Comprehensive metabolic panel  Result Value Ref Range   Sodium 143 135 - 145 mmol/L   Potassium 4.7 3.5 - 5.1 mmol/L   Chloride 111 101 - 111 mmol/L   CO2 19 (L) 22 - 32 mmol/L   Glucose, Bld 167 (H) 65 - 99 mg/dL   BUN 39 (H) 6 - 20 mg/dL   Creatinine, Ser 1.65 (H) 0.44 - 1.00 mg/dL   Calcium 9.7 8.9 - 10.3 mg/dL   Total Protein 7.8 6.5 - 8.1 g/dL   Albumin 4.5 3.5 - 5.0 g/dL   AST 18 15 - 41 U/L   ALT 10 (L) 14 - 54 U/L   Alkaline Phosphatase 177 (H) 38 - 126 U/L   Total Bilirubin 0.2 (L) 0.3 - 1.2 mg/dL   GFR calc non Af Amer 26 (L) >60 mL/min   GFR calc Af Amer 30 (L) >60 mL/min   Anion gap 13 5 - 15  Lipase, blood  Result Value Ref Range   Lipase 26 11 - 51 U/L  Troponin I  Result Value Ref Range   Troponin I 0.05 (HH) <0.03 ng/mL  CBC with Differential  Result Value Ref Range   WBC 7.8 4.0 - 10.5 K/uL   RBC 3.50 (L) 3.87 - 5.11 MIL/uL   Hemoglobin 10.4 (L) 12.0 - 15.0 g/dL   HCT 33.7 (L) 36.0 - 46.0 %   MCV 96.3 78.0 -  100.0 fL   MCH 29.7 26.0 - 34.0 pg   MCHC 30.9 30.0 - 36.0 g/dL   RDW 16.6 (H) 11.5 - 15.5 %   Platelets 224 150 - 400 K/uL   Neutrophils Relative % 90 %   Neutro Abs 7.0 1.7 - 7.7 K/uL   Lymphocytes Relative 6 %   Lymphs Abs 0.5 (L) 0.7 - 4.0 K/uL   Monocytes Relative 2 %   Monocytes Absolute 0.2 0.1 - 1.0 K/uL   Eosinophils Relative 2 %   Eosinophils Absolute 0.2 0.0 - 0.7 K/uL   Basophils Relative 0 %   Basophils Absolute 0.0 0.0 - 0.1 K/uL  Influenza panel by PCR (type A & B)  Result Value Ref Range   Influenza A By PCR POSITIVE (A) NEGATIVE   Influenza B By PCR NEGATIVE NEGATIVE   Dg Abd Acute W/chest Result Date: 07/04/2017 CLINICAL DATA:  Cough with nausea, vomiting, and  diarrhea EXAM: DG ABDOMEN ACUTE W/ 1V CHEST COMPARISON:  06/29/2017 FINDINGS: Large volume lungs. There is no edema, consolidation, effusion, or pneumothorax. Stable borderline heart size. Atherosclerotic calcification of the aorta with stable upper mediastinal widening that is likely from ectatic vessels. 16 mm right renal calculus. Normal bowel gas pattern. No evidence of pneumoperitoneum. Advanced spinal degenerative disease with scoliosis. IMPRESSION: 1. No acute finding in the chest or abdomen. 2. 16 mm right renal calculus. 3. Large lung volumes, possible COPD in this former smoker. Electronically Signed   By: Monte Fantasia M.D.   On: 07/04/2017 09:09    Results for KAIYA, BOATMAN (MRN [id number]) as of 07/04/2017 09:37  Ref. Range 01/02/2016 08:42 07/02/2016 08:55 01/13/2017 08:25 05/14/2017 10:12 07/04/2017 08:11  Hemoglobin Latest Ref Range: 12.0 - 15.0 g/dL 9.8 (L) 10.0 (L) 9.9 (L) 9.8 (L) 10.4 (L)  HCT Latest Ref Range: 36.0 - 46.0 % 30.8 (L) 32.9 (L) 32.0 (L) 30.4 (L) 33.7 (L)    Results for JAELAH, HAUTH (MRN [id number]) as of 07/04/2017 09:37  Ref. Range 06/11/2017 08:05 06/19/2017 09:00 06/25/2017 09:54 06/29/2017 11:55 07/04/2017 08:11  BUN Latest Ref Range: 6 - 20 mg/dL 13 40 (H) 48 (H) 55 (H) 39 (H)  Creatinine Latest Ref Range: 0.44 - 1.00 mg/dL 1.00 1.82 (H) 2.10 (H) 2.19 (H) 1.65 (H)    1040:  Elevated trop; no old to compare. EKG largely unchanged from previous. Pt denies CP now or at any time recently. Continues to deny abd pain. Judicious IVF given for clinical dehydration and orthostatic VS changes. T/C to [facility] Dr. Jerilee Hoh, case discussed, including:  HPI, pertinent PM/SHx, VS/PE, dx testing, ED course and treatment:  Agreeable to come to ED for evaluation.      Final Clinical Impressions(s) / ED Diagnoses   Final diagnoses:  None    ED Discharge Orders    None       Francine Graven, DO 07/06/17 3748

## 2017-07-04 NOTE — ED Notes (Signed)
CRITICAL VALUE ALERT  Critical Value:  troponin  Date & Time Notied:  Troponin 0.05  Provider Notified: [email or web address]  Orders Received/Actions taken:

## 2017-07-04 NOTE — ED Notes (Signed)
ED Provider at bedside. 

## 2017-07-04 NOTE — Consult Note (Signed)
Requesting physician: Francine Graven, EDP  Primary Care Physician: Alycia Rossetti, MD  Reason for consultation: Potential admission   History of Present Illness: Patient is a delightful 84 year old woman who comes in today with a 2-3-day history of URI symptoms including sore throat, runny/stuffy nose, sinus congestion and cough.  Over the past 24 hours has also developed intermittent episodes of nausea, vomiting and diarrhea.  Multiple other family members have been diagnosed with the flu.  Because she has been unable to eat anything over the past 12 hours they bring her into the hospital for evaluation.  On admission vital signs are stable, specifically she is afebrile and does not have an oxygen requirement.  On lab work her WBC count is 7.8, troponin is slightly elevated at 0.05, creatinine is 1.65 (baseline appears to be around 2.1).  Chest x-ray without acute abnormalities.  Influenza PCR has resulted positive for flu A.  I am asked to see her for potential admission.  Allergies:  No Known Allergies    Past Medical History:  Diagnosis Date  . Arthritis   . Bilateral carpal tunnel syndrome   . Cataracts, both eyes   . Chronic diastolic CHF (congestive heart failure) ([place])    a. Echo (09/2012): Mild LVH, EF 65-70%, moderately elevated RVSP, moderate TR, mild to moderate MR, mild LAE, grade 2 diastolic dysfunction  . Diabetes mellitus [facility]  . Diabetic neuropathy (HCC) both hands and legs  . GERD (gastroesophageal reflux disease)   . Glaucoma   . Gout   . History of echocardiogram    Echo 1/19:  EF 65-70, no RWMA, Gr 2 DD, mild MR, severe LAE, PASP 62   . History of falling RECENT FALL 1 WK AGO--   NO INJURY  . 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 History of transfusion   . Hyperlipidemia   . Hypertension   . Mitral regurgitation   . PUD (peptic ulcer disease)    many yrs ago  . Right leg weakness   . Rotator cuff tear    left  . Slipped  intervertebral disc   . Walker as ambulation aid     Past Surgical History:  Procedure Laterality Date  . ABDOMINAL HYSTERECTOMY  1966  . APPENDECTOMY    . CARDIOVASCULAR STRESS TEST  06-18-2005   DR Daneen Schick   NORMAL STUDY/ NO EVIDENCE ISCHEMIA/ EF 75%  . CARPAL TUNNEL RELEASE  09/17/2011   Procedure: CARPAL TUNNEL RELEASE;  Surgeon: Magnus Sinning, MD;  Location: [facility];  Service: Orthopedics;  Laterality: Right;  . CARPAL TUNNEL RELEASE  10/22/2011   Procedure: CARPAL TUNNEL RELEASE;  Surgeon: Magnus Sinning, MD;  Location: [facility];  Service: Orthopedics;  Laterality: Left;  . EYE SURGERY    . LUMBAR LAMINECTOMY/DECOMPRESSION MICRODISCECTOMY  01/14/2012   Procedure: LUMBAR LAMINECTOMY/DECOMPRESSION MICRODISCECTOMY 3 LEVELS;  Surgeon: Magnus Sinning, MD;  Location: [facility];  Service: Orthopedics;  Laterality: N/A;  Decompression Laminectomy of L2 - L3, L3 - L4 and L4 - L5 Central  (X-Ray)  . SHOULDER OPEN ROTATOR CUFF REPAIR  06/02/2012   Procedure: ROTATOR CUFF REPAIR SHOULDER OPEN;  Surgeon: Magnus Sinning, MD;  Location: [facility];  Service: Orthopedics;  Laterality: Left;  Left Shoulder Open Distal Clavicle Resection Anterior Acrominectomy and Rotator Cuff Repair  . SPINE SURGERY    . TRANSTHORACIC ECHOCARDIOGRAM  12-26-2008  DR Daneen Schick  NORMAL LVF/  EF  71%/   MILD ASYMMETRIC SEPTAL HYPERTROPHY/ MILD LEFT ATRIAL ENLARGEMENT/ MODERATELY ELEVATED ESTIMATED RIGHT VENTRICULAR SYSTOLIC PRESSURE/ MILD MITRAL  &  TRICUSPID VALVE REGURG.    Scheduled Meds: Continuous Infusions: PRN Meds:.  Social History:  reports that she quit smoking about 49 years ago. Her smoking use included cigarettes. She has a 30.00 pack-year smoking history. she has never used smokeless tobacco. She reports that she does not drink alcohol or use drugs.  Family History  Problem Relation Age of Onset  . Hypertension Father   . Hypertension Mother   . Stroke  Mother   . Diabetes Sister   . Hypertension Sister   . Cancer Sister   . Arthritis Sister   . Hypertension Brother   . Alcohol abuse Brother   . Hypertension Sister   . Arthritis Sister   . Hypertension Sister   . Arthritis Sister   . Arthritis Sister   . Arthritis Sister   . Heart attack Son   . Colon cancer Neg Hx     Review of Systems:  Constitutional: Denies fever, chills, diaphoresis, positive for appetite change and fatigue.  HEENT: Denies photophobia, eye pain, redness, hearing loss, ear pain, congestion,  rhinorrhea, sneezing, mouth sores, trouble swallowing, neck pain, neck stiffness and tinnitus.  positive sore throat Respiratory: Denies SOB, DOE, cough, chest tightness,  and wheezing.   Cardiovascular: Denies chest pain, palpitations and leg swelling.  Gastrointestinal: positive nausea, vomiting, , diarrhea, denies constipation, blood in stool and abdominal distention.  Genitourinary: Denies dysuria, urgency, frequency, hematuria, flank pain and difficulty urinating.  Endocrine: Denies: hot or cold intolerance, sweats, changes in hair or nails, polyuria, polydipsia. Musculoskeletal: Deniesback pain, joint swelling, arthralgias and gait problem. positive for myalgias Skin: Denies pallor, rash and wound.  Neurological: Denies dizziness, seizures, syncope, weakness, light-headedness, numbness and headaches.  Hematological: Denies adenopathy. Easy bruising, personal or family bleeding history  Psychiatric/Behavioral: Denies suicidal ideation, mood changes, confusion, nervousness, sleep disturbance and agitation   Physical Exam: Blood pressure (!) 146/67, pulse 93, temperature 98.2 F (36.8 C), temperature source Oral, resp. rate (!) 26, height '5\' 5"'$  (1.651 m), weight 83 kg (183 lb), SpO2 96 %.  General: Elderly female awake, alert, oriented x3, very pleasant and cooperative with exam. HEENT: Normocephalic, atraumatic, pupils equal round reactive to light, extraocular  movements intact, Neck: Supple, no JVD, no lymphadenopathy, no bruits, no goiter Cardiovascular: Regular rate and rhythm, no murmurs, rubs or rubs Lungs: Clear to auscultation bilaterally Abdomen: Soft, nontender, nondistended, positive bowel sounds Extremities: No clubbing, cyanosis or edema, positive pedal pulses Neurologic: Grossly intact and nonfocal  Labs on Admission:  Results for orders placed or performed during the hospital encounter of 07/04/17 (from the past 48 hour(s))  Urinalysis, Routine w reflex microscopic     Status: Abnormal   Collection Time: 07/04/17  8:11 AM  Result Value Ref Range   Color, Urine STRAW (A) YELLOW   APPearance CLEAR CLEAR   Specific Gravity, Urine 1.012 1.005 - 1.030   pH 5.0 5.0 - 8.0   Glucose, UA NEGATIVE NEGATIVE mg/dL   Hgb urine dipstick NEGATIVE NEGATIVE   Bilirubin Urine NEGATIVE NEGATIVE   Ketones, ur NEGATIVE NEGATIVE mg/dL   Protein, ur NEGATIVE NEGATIVE mg/dL   Nitrite NEGATIVE NEGATIVE   Leukocytes, UA NEGATIVE NEGATIVE    Comment: Performed at [facility], [address] [place], [place] [address]  Comprehensive metabolic panel     Status: Abnormal   Collection Time:  07/04/17  8:11 AM  Result Value Ref Range   Sodium 143 135 - 145 mmol/L   Potassium 4.7 3.5 - 5.1 mmol/L   Chloride 111 101 - 111 mmol/L   CO2 19 (L) 22 - 32 mmol/L   Glucose, Bld 167 (H) 65 - 99 mg/dL   BUN 39 (H) 6 - 20 mg/dL   Creatinine, Ser 1.65 (H) 0.44 - 1.00 mg/dL   Calcium 9.7 8.9 - 10.3 mg/dL   Total Protein 7.8 6.5 - 8.1 g/dL   Albumin 4.5 3.5 - 5.0 g/dL   AST 18 15 - 41 U/L   ALT 10 (L) 14 - 54 U/L   Alkaline Phosphatase 177 (H) 38 - 126 U/L   Total Bilirubin 0.2 (L) 0.3 - 1.2 mg/dL   GFR calc non Af Amer 26 (L) >60 mL/min   GFR calc Af Amer 30 (L) >60 mL/min    Comment: (NOTE) The eGFR has been calculated using the CKD EPI equation. This calculation has not been validated in all clinical situations. eGFR's persistently <60 mL/min signify  possible Chronic Kidney Disease.    Anion gap 13 5 - 15    Comment: Performed at [facility], [address], [place], [place] [address]  Lipase, blood     Status: None   Collection Time: 07/04/17  8:11 AM  Result Value Ref Range   Lipase 26 11 - 51 U/L    Comment: Performed at [facility], [address] [place], [place] [address]  Troponin I     Status: Abnormal   Collection Time: 07/04/17  8:11 AM  Result Value Ref Range   Troponin I 0.05 (HH) <0.03 ng/mL    Comment: CRITICAL RESULT CALLED TO, READ BACK BY AND VERIFIED WITH: KINDREK @ 0928 ON [id number] BY HENDERSONL Performed at [facility], [address] [place] [address] [place], [place] [address]   CBC with Differential     Status: Abnormal   Collection Time: 07/04/17  8:11 AM  Result Value Ref Range   WBC 7.8 4.0 - 10.5 K/uL   RBC 3.50 (L) 3.87 - 5.11 MIL/uL   Hemoglobin 10.4 (L) 12.0 - 15.0 g/dL   HCT 33.7 (L) 36.0 - 46.0 %   MCV 96.3 78.0 - 100.0 fL   MCH 29.7 26.0 - 34.0 pg   MCHC 30.9 30.0 - 36.0 g/dL   RDW 16.6 (H) 11.5 - 15.5 %   Platelets 224 150 - 400 K/uL   Neutrophils Relative % 90 %   Neutro Abs 7.0 1.7 - 7.7 K/uL   Lymphocytes Relative 6 %   Lymphs Abs 0.5 (L) 0.7 - 4.0 K/uL   Monocytes Relative 2 %   Monocytes Absolute 0.2 0.1 - 1.0 K/uL   Eosinophils Relative 2 %   Eosinophils Absolute 0.2 0.0 - 0.7 K/uL   Basophils Relative 0 %   Basophils Absolute 0.0 0.0 - 0.1 K/uL    Comment: Performed at [facility], [address], [place], [place] [address]  Rapid strep screen     Status: None   Collection Time: 07/04/17  8:11 AM  Result Value Ref Range   Streptococcus, Group A Screen (Direct) NEGATIVE NEGATIVE    Comment: (NOTE) A Rapid Antigen test may result negative if the antigen level in the sample is below the detection level of this test. The FDA has not cleared this test as a stand-alone test therefore the rapid antigen negative result has reflexed to a Group A Strep culture. Performed at [facility], [address] [place], [place] [address]   Influenza panel by PCR (type A & B)     Status: Abnormal   Collection Time: 07/04/17  8:11 AM  Result Value Ref Range   Influenza A By PCR POSITIVE (A) NEGATIVE   Influenza B By PCR NEGATIVE NEGATIVE    Comment: (NOTE) The Xpert Xpress Flu assay is intended as an aid in the diagnosis of  influenza and should not be used as a sole basis for treatment.  This  assay is FDA approved for nasopharyngeal swab specimens only. Nasal  washings and aspirates are unacceptable for Xpert Xpress Flu testing. Performed at [facility], [address] [place], [place] [address]     Radiological Exams on Admission: Dg Abd Acute W/chest  Result Date: 07/04/2017 CLINICAL DATA:  Cough with nausea, vomiting, and diarrhea EXAM: DG ABDOMEN ACUTE W/ 1V CHEST COMPARISON:  06/29/2017 FINDINGS: Large volume lungs. There is no edema, consolidation, effusion, or pneumothorax. Stable borderline heart size. Atherosclerotic calcification of the aorta with stable upper mediastinal widening that is likely from ectatic vessels. 16 mm right renal calculus. Normal bowel gas pattern. No evidence of pneumoperitoneum. Advanced spinal degenerative disease with scoliosis. IMPRESSION: 1. No acute finding in the chest or abdomen. 2. 16 mm right renal calculus. 3. Large lung volumes, possible COPD in this former smoker. Electronically Signed   By: Monte Fantasia M.D.   On: 07/04/2017 09:09    Assessment/Plan  Influenza A positive -All of patient's URI and GI symptoms can be attributed to her being influenza A positive.  All of her family members have been diagnosed with flu as well. -Have discussed with patient and daughter at bedside, also with granddaughter via phone the fact that she has none of the typical "warning signs" that would make me want to admit her for observation.  These would include fever, hypoxemia, very elevated white count. -She has received a liter of IV fluids in the emergency  department. -I would recommend at this point discharge home, copious fluid intake. -Tamiflu 75 mg twice daily for 5 days will be prescribed. -Of note, rapid strep screen was negative.  Elevated troponin -Very minimal elevation at 0.05. -Patient does not have acute ischemic findings on EKG. -Echo from January 2019 shows an ejection fraction of 65-70% with normal wall motion and grade 2 dysfunction. -See no need to repeat echo at this time. -Suspect minimal troponin leak is likely secondary to demand ischemia from acute influenza illness. -Do not believe further cardiac workup is anticipated at this time.   Time Spent on Consultation: 85 minutes  [place] [facility]  [phone] 07/04/2017, 5:29 PM

## 2017-07-04 NOTE — ED Triage Notes (Signed)
Pt reports nausea, vomiting, and diarrhea starting last night.  Has been feeling bad x 3 days.  Daughter and granddaughter who she lives with have same symptoms.

## 2017-07-05 LAB — URINE CULTURE

## 2017-07-06 LAB — CULTURE, GROUP A STREP (THRC)

## 2017-07-11 ENCOUNTER — Other Ambulatory Visit: Payer: Self-pay | Admitting: Family Medicine

## 2017-07-15 ENCOUNTER — Other Ambulatory Visit: Payer: Self-pay

## 2017-07-15 ENCOUNTER — Ambulatory Visit (INDEPENDENT_AMBULATORY_CARE_PROVIDER_SITE_OTHER): Payer: Medicare Other | Admitting: Family Medicine

## 2017-07-15 ENCOUNTER — Encounter: Payer: Self-pay | Admitting: Family Medicine

## 2017-07-15 VITALS — BP 138/68 | HR 68 | Temp 97.9°F | Resp 16 | Ht 65.0 in | Wt 163.0 lb

## 2017-07-15 DIAGNOSIS — R634 Abnormal weight loss: Secondary | ICD-10-CM

## 2017-07-15 DIAGNOSIS — E860 Dehydration: Secondary | ICD-10-CM | POA: Diagnosis not present

## 2017-07-15 DIAGNOSIS — J101 Influenza due to other identified influenza virus with other respiratory manifestations: Secondary | ICD-10-CM

## 2017-07-15 DIAGNOSIS — J189 Pneumonia, unspecified organism: Secondary | ICD-10-CM | POA: Diagnosis not present

## 2017-07-15 LAB — COMPREHENSIVE METABOLIC PANEL
AG Ratio: 1.6 (calc) (ref 1.0–2.5)
ALT: 7 U/L (ref 6–29)
AST: 12 U/L (ref 10–35)
Albumin: 4.3 g/dL (ref 3.6–5.1)
Alkaline phosphatase (APISO): 171 U/L — ABNORMAL HIGH (ref 33–130)
BUN/Creatinine Ratio: 18 (calc) (ref 6–22)
BUN: 38 mg/dL — AB (ref 7–25)
CO2: 19 mmol/L — ABNORMAL LOW (ref 20–32)
CREATININE: 2.11 mg/dL — AB (ref 0.60–0.88)
Calcium: 9.5 mg/dL (ref 8.6–10.4)
Chloride: 111 mmol/L — ABNORMAL HIGH (ref 98–110)
GLUCOSE: 129 mg/dL — AB (ref 65–99)
Globulin: 2.7 g/dL (calc) (ref 1.9–3.7)
Potassium: 4.8 mmol/L (ref 3.5–5.3)
Sodium: 142 mmol/L (ref 135–146)
TOTAL PROTEIN: 7 g/dL (ref 6.1–8.1)
Total Bilirubin: 0.4 mg/dL (ref 0.2–1.2)

## 2017-07-15 LAB — CBC
HEMATOCRIT: 29.8 % — AB (ref 35.0–45.0)
Hemoglobin: 9.7 g/dL — ABNORMAL LOW (ref 11.7–15.5)
MCH: 31.6 pg (ref 27.0–33.0)
MCHC: 32.6 g/dL (ref 32.0–36.0)
MCV: 97.1 fL (ref 80.0–100.0)
PLATELETS: 320 10*3/uL (ref 140–400)
RBC: 3.07 10*6/uL — ABNORMAL LOW (ref 3.80–5.10)
RDW: 16.5 % — AB (ref 11.0–15.0)
WBC: 10.4 10*3/uL (ref 3.8–10.8)

## 2017-07-15 MED ORDER — ALBUTEROL SULFATE HFA 108 (90 BASE) MCG/ACT IN AERS: 2.0000 | INHALATION_SPRAY | RESPIRATORY_TRACT | 0 refills | Status: DC | PRN

## 2017-07-15 MED ORDER — ALBUTEROL SULFATE (2.5 MG/3ML) 0.083% IN NEBU
2.5000 mg | INHALATION_SOLUTION | Freq: Once | RESPIRATORY_TRACT | Status: AC
  Administered 2017-07-15: 2.5 mg via RESPIRATORY_TRACT

## 2017-07-15 MED ORDER — PREDNISONE 10 MG PO TABS: ORAL_TABLET | ORAL | 0 refills | Status: DC

## 2017-07-15 MED ORDER — METHYLPREDNISOLONE ACETATE 40 MG/ML IJ SUSP
40.0000 mg | Freq: Once | INTRAMUSCULAR | Status: AC
  Administered 2017-07-15: 40 mg via INTRAMUSCULAR

## 2017-07-15 MED ORDER — LEVOFLOXACIN 500 MG PO TABS: 500.0000 mg | ORAL_TABLET | Freq: Every day | ORAL | 0 refills | Status: DC

## 2017-07-15 NOTE — Progress Notes (Signed)
   Subjective:    Patient ID: [name], female    DOB: 1924/02/02, 84 y.o.   MRN: [id number]  Patient presents for Cough (was seen at ER on 2/9 for flu- productive cough continues- fatigue)  Seen ER 2/9 after episodes of nausea vomiting cough fatigue decreased appetite.  She was diagnosed with influenza A her chest x-ray showed COPD changes but no acute infiltrate.  She was discharged home with Tamiflu.  She also took some of her daughter's Tussin cough medicine. She still not eating very well or drinking very much.  She continues to cough which is productive of a thick yellow-green sputum which I actually saw here in the office.  She has not had any fever the past 3-4 days.  Her diarrhea has also resolved.  She has felt tight in the chest noticed wheezing between her coughing episodes. Her weight is down 20lbs in the past 2 months  Review Of Systems:  GEN- + fatigue, fever, +weight loss,+weakness, recent illness HEENT- denies eye drainage, change in vision, nasal discharge, CVS- denies chest pain, palpitations RESP- +SOB,+ cough,+ wheeze ABD- denies N/V, change in stools, abd pain GU- denies dysuria, hematuria, dribbling, incontinence MSK- denies joint pain, muscle aches, injury Neuro- denies headache, dizziness, syncope, seizure activity       Objective:    BP 138/68   Pulse 68   Temp 97.9 F (36.6 C) (Oral)   Resp 16   Ht 5\' 5"  (1.651 m)   Wt 163 lb (73.9 kg)   SpO2 99%   BMI 27.12 kg/m  GEN- NAD, alert and oriented x3 HEENT- PERRL, EOMI, non injected sclera, pink conjunctiva, DRY MM, oropharynx clear Neck- Supple, no LAD CVS- RRR, no murmur RESPbilat wheeze, fair air movement, rhonchi bilat, junky sounding throughout ABD-NABS,soft,NT,ND EXT- non pitting pedal edema Pulses- Radial  2+    S/P Neb improved WOB, continued rhonchi  CBC WBC 10.4 Hb 9.7 Plt 320     Assessment & Plan:      Problem List Items Addressed This Visit    None    Visit Diagnoses    Dehydration    -  Primary   dehydration in setting of flu and now post bacterial infection concerning for PNA with bronchitis. Given 1 L NS in office, metabolic sent STAT She felt much better after IVF   Relevant Orders   CBC   Comprehensive metabolic panel   Community acquired pneumonia, unspecified laterality       Treat , Depo Medrol 40mg  and Neb given in office, start Levaquin, prednisone taper, albuterol, robitussin DM. Recheck on Friday. Discussed red flags with daughter   Relevant Medications   albuterol (PROVENTIL) (2.5 MG/3ML) 0.083% nebulizer solution 2.5 mg (Completed)   methylPREDNISolone acetate (DEPO-MEDROL) injection 40 mg (Completed)   levofloxacin (LEVAQUIN) 500 MG tablet   albuterol (PROVENTIL HFA;VENTOLIN HFA) 108 (90 Base) MCG/ACT inhaler   Other Relevant Orders   CBC   Comprehensive metabolic panel   Influenza A       Completed tamiflu   Weight loss       Start boost, treat acute illness and trend weights      Note: This dictation was prepared with Dragon dictation along with smaller phrase technology. Any transcriptional errors that result from this process are unintentional.

## 2017-07-15 NOTE — Patient Instructions (Addendum)
Take antibiotics Steroids Use inhaler Robitussin DM Drink boost, plenty of fluids  F/U Friday for recheck

## 2017-07-17 ENCOUNTER — Encounter: Payer: Self-pay | Admitting: Family Medicine

## 2017-07-17 ENCOUNTER — Ambulatory Visit (INDEPENDENT_AMBULATORY_CARE_PROVIDER_SITE_OTHER): Payer: Medicare Other | Admitting: Family Medicine

## 2017-07-17 ENCOUNTER — Other Ambulatory Visit: Payer: Self-pay

## 2017-07-17 VITALS — BP 132/80 | HR 84 | Temp 97.9°F | Resp 16 | Ht 65.0 in | Wt 183.0 lb

## 2017-07-17 DIAGNOSIS — J189 Pneumonia, unspecified organism: Secondary | ICD-10-CM | POA: Diagnosis not present

## 2017-07-17 NOTE — Progress Notes (Signed)
   Subjective:    Patient ID: [name], female    DOB: 1923/06/14, 84 y.o.   MRN: [id number]  Patient presents for Follow-up  Pt here for intermin f/u on sick visit, Diagnosed with probable CAP after influenza A. She was quite dehydrated on 2/20 given 1L NS in office. Also had bronchitic changes, given neb, prednisone in office, sent home with Levaquin, prednisone and albuterol.    Weight was 163lbs a few days ago but this was likely an error, today back at her baseline   Cough- is much better, she is moving around more as well. Still has some production but it is clearing up. No fever. Used inhaler the first day, non since. Taking all medications Appetite also improved   Review Of Systems:  GEN- denies fatigue, fever, weight loss,weakness, recent illness HEENT- denies eye drainage, change in vision, nasal discharge, CVS- denies chest pain, palpitations RESP- denies SOB, +cough, wheeze ABD- denies N/V, change in stools, abd pain GU- denies dysuria, hematuria, dribbling, incontinence MSK- denies joint pain, muscle aches, injury Neuro- denies headache, dizziness, syncope, seizure activity       Objective:    BP 132/80   Pulse 84   Temp 97.9 F (36.6 C) (Oral)   Resp 16   Ht 5\' 5"  (1.651 m)   Wt 183 lb (83 kg)   SpO2 98%   BMI 30.45 kg/m  GEN- NAD, alert and oriented x3,more energetic today  HEENT- PERRL, EOMI, non injected sclera, pink conjunctiva, MMM, oropharynx clear Neck- Supple, no LAD CVS- RRR, no murmur RESP-CTAB ABD-NABS,soft,NT,ND EXT- non pitting pedal edema Pulses- Radial  2+        Assessment & Plan:      Problem List Items Addressed This Visit    None    Visit Diagnoses    Community acquired pneumonia, unspecified laterality    -  Primary   Much improved, good air movement, no bronchospasm, no fever normal sat, complete antibiotics,steroids      Note: This dictation was prepared with Dragon dictation along with smaller phrase technology.  Any transcriptional errors that result from this process are unintentional.

## 2017-07-17 NOTE — Patient Instructions (Addendum)
Complete medications  F/U as previous

## 2017-08-12 ENCOUNTER — Other Ambulatory Visit: Payer: Self-pay | Admitting: Family Medicine

## 2017-09-10 ENCOUNTER — Ambulatory Visit (INDEPENDENT_AMBULATORY_CARE_PROVIDER_SITE_OTHER): Payer: Medicare Other | Admitting: Family Medicine

## 2017-09-10 ENCOUNTER — Encounter: Payer: Self-pay | Admitting: Family Medicine

## 2017-09-10 ENCOUNTER — Other Ambulatory Visit: Payer: Self-pay

## 2017-09-10 VITALS — BP 128/72 | HR 80 | Temp 97.9°F | Resp 14 | Ht 65.0 in | Wt 183.0 lb

## 2017-09-10 DIAGNOSIS — I10 Essential (primary) hypertension: Secondary | ICD-10-CM

## 2017-09-10 DIAGNOSIS — G6289 Other specified polyneuropathies: Secondary | ICD-10-CM

## 2017-09-10 DIAGNOSIS — I5032 Chronic diastolic (congestive) heart failure: Secondary | ICD-10-CM | POA: Diagnosis not present

## 2017-09-10 DIAGNOSIS — E7849 Other hyperlipidemia: Secondary | ICD-10-CM

## 2017-09-10 DIAGNOSIS — E119 Type 2 diabetes mellitus without complications: Secondary | ICD-10-CM | POA: Diagnosis not present

## 2017-09-10 DIAGNOSIS — N183 Chronic kidney disease, stage 3 unspecified: Secondary | ICD-10-CM

## 2017-09-10 NOTE — Assessment & Plan Note (Signed)
Unchanged, but overall little pain in feet

## 2017-09-10 NOTE — Assessment & Plan Note (Addendum)
Currently stating overall doing well at home.  Her appetite is good she has not had any falls she is here with her daughter.

## 2017-09-10 NOTE — Assessment & Plan Note (Signed)
Has been well controlled discussed cutting down the sweets

## 2017-09-10 NOTE — Progress Notes (Signed)
   Subjective:    Patient ID: [name], female    DOB: 05/28/1923, 84 y.o.   MRN: [id number]  Patient presents for Follow-up (is fasting)  Pt here to f/u chronic medical problems  Medications reviewed CHF- taking diuretic as prescribed , no change in shortness of breath, no chest pain HTN- taking BP meds as prescribed,no side effects DM- diet controlled with neuropathy , last A1C 6.1%, cbg this morning 172, has been high past 2 weeks,has been eating gummy bears, and peaches  Hyperlipidemia- last LDL 67 in August 2018 TG mildly elevated at 180, on pravastatin Gout- no difficulty , taking allopurinol    Review Of Systems:  GEN- denies fatigue, fever, weight loss,weakness, recent illness HEENT- denies eye drainage, change in vision, nasal discharge, CVS- denies chest pain, palpitations RESP- denies SOB, cough, wheeze ABD- denies N/V, change in stools, abd pain GU- denies dysuria, hematuria, dribbling, incontinence MSK- denies joint pain, muscle aches, injury Neuro- denies headache, dizziness, syncope, seizure activity       Objective:    BP 128/72   Pulse 80   Temp 97.9 F (36.6 C) (Oral)   Resp 14   Ht 5\' 5"  (1.651 m)   Wt 183 lb (83 kg)   SpO2 98%   BMI 30.45 kg/m  GEN- NAD, alert and oriented x3 HEENT- PERRL, EOMI, non injected sclera, pink conjunctiva, MMM, oropharynx clear Neck- Supple, no thyromegaly CVS- RRR, no murmur RESP-CTAB ABD-NABS,soft,NT,ND EXT- chronic non pitting edema most at ankles  Pulses- Radial 2+, DP- 1+        Assessment & Plan:      Problem List Items Addressed This Visit      Unprioritized   Hyperlipidemia   Relevant Orders   Lipid panel   Essential hypertension   CKD (chronic kidney disease), stage III (HCC)   Relevant Orders   Comprehensive metabolic panel   Peripheral neuropathy    Unchanged, but overall little pain in feet       Diabetes mellitus, type II (HCC)    Has been well controlled discussed cutting down  the sweets      Relevant Orders   Hemoglobin A1c   HM DIABETES FOOT EXAM (Completed)   Chronic diastolic CHF (congestive heart failure) ([place]) - Primary    Currently stating overall doing well at home.  Her appetite is good she has not had any falls she is here with her daughter.      Relevant Orders   CBC with Differential/Platelet   Comprehensive metabolic panel      Note: This dictation was prepared with Dragon dictation along with smaller phrase technology. Any transcriptional errors that result from this process are unintentional.

## 2017-09-10 NOTE — Patient Instructions (Addendum)
F/U 4 months for Physical  We will call with lab results  Release of records [facility]

## 2017-09-11 LAB — COMPREHENSIVE METABOLIC PANEL
AG Ratio: 1.5 (calc) (ref 1.0–2.5)
ALKALINE PHOSPHATASE (APISO): 152 U/L — AB (ref 33–130)
ALT: 6 U/L (ref 6–29)
AST: 11 U/L (ref 10–35)
Albumin: 3.8 g/dL (ref 3.6–5.1)
BILIRUBIN TOTAL: 0.3 mg/dL (ref 0.2–1.2)
BUN/Creatinine Ratio: 20 (calc) (ref 6–22)
BUN: 36 mg/dL — AB (ref 7–25)
CO2: 23 mmol/L (ref 20–32)
CREATININE: 1.79 mg/dL — AB (ref 0.60–0.88)
Calcium: 9.3 mg/dL (ref 8.6–10.4)
Chloride: 110 mmol/L (ref 98–110)
GLOBULIN: 2.5 g/dL (ref 1.9–3.7)
Glucose, Bld: 140 mg/dL — ABNORMAL HIGH (ref 65–99)
Potassium: 4.6 mmol/L (ref 3.5–5.3)
SODIUM: 143 mmol/L (ref 135–146)
TOTAL PROTEIN: 6.3 g/dL (ref 6.1–8.1)

## 2017-09-11 LAB — CBC WITH DIFFERENTIAL/PLATELET
Basophils Absolute: 44 cells/uL (ref 0–200)
Basophils Relative: 0.5 %
EOS PCT: 3.1 %
Eosinophils Absolute: 270 cells/uL (ref 15–500)
HCT: 31.7 % — ABNORMAL LOW (ref 35.0–45.0)
HEMOGLOBIN: 10.1 g/dL — AB (ref 11.7–15.5)
Lymphs Abs: 1053 cells/uL (ref 850–3900)
MCH: 29 pg (ref 27.0–33.0)
MCHC: 31.9 g/dL — AB (ref 32.0–36.0)
MCV: 91.1 fL (ref 80.0–100.0)
MONOS PCT: 4.2 %
MPV: 10 fL (ref 7.5–12.5)
NEUTROS ABS: 6969 {cells}/uL (ref 1500–7800)
NEUTROS PCT: 80.1 %
Platelets: 289 10*3/uL (ref 140–400)
RBC: 3.48 10*6/uL — AB (ref 3.80–5.10)
RDW: 15 % (ref 11.0–15.0)
Total Lymphocyte: 12.1 %
WBC mixed population: 365 cells/uL (ref 200–950)
WBC: 8.7 10*3/uL (ref 3.8–10.8)

## 2017-09-11 LAB — HEMOGLOBIN A1C
EAG (MMOL/L): 7.6 (calc)
Hgb A1c MFr Bld: 6.4 % of total Hgb — ABNORMAL HIGH (ref <5.7)
Mean Plasma Glucose: 137 (calc)

## 2017-09-11 LAB — LIPID PANEL
CHOLESTEROL: 135 mg/dL (ref <200)
HDL: 40 mg/dL — AB (ref >50)
LDL Cholesterol (Calc): 71 mg/dL (calc)
NON-HDL CHOLESTEROL (CALC): 95 mg/dL (ref <130)
TRIGLYCERIDES: 158 mg/dL — AB (ref <150)
Total CHOL/HDL Ratio: 3.4 (calc) (ref <5.0)

## 2017-09-14 ENCOUNTER — Ambulatory Visit: Payer: Medicare Other | Admitting: Family Medicine

## 2017-09-15 ENCOUNTER — Encounter: Payer: Self-pay | Admitting: *Deleted

## 2017-09-17 ENCOUNTER — Ambulatory Visit: Payer: Medicare Other | Admitting: Family Medicine

## 2017-10-15 ENCOUNTER — Other Ambulatory Visit: Payer: Self-pay | Admitting: Family Medicine

## 2017-10-22 ENCOUNTER — Other Ambulatory Visit: Payer: Self-pay | Admitting: Family Medicine

## 2017-11-04 ENCOUNTER — Encounter (HOSPITAL_COMMUNITY): Payer: Self-pay | Admitting: Emergency Medicine

## 2017-11-04 ENCOUNTER — Other Ambulatory Visit: Payer: Self-pay

## 2017-11-04 ENCOUNTER — Telehealth: Payer: Self-pay | Admitting: Interventional Cardiology

## 2017-11-04 ENCOUNTER — Emergency Department (HOSPITAL_COMMUNITY)
Admission: EM | Admit: 2017-11-04 | Discharge: 2017-11-04 | Disposition: A | Discharge status: Home / Self Care | Payer: Medicare Other | Attending: Emergency Medicine | Admitting: Emergency Medicine

## 2017-11-04 ENCOUNTER — Emergency Department (HOSPITAL_COMMUNITY): Payer: Medicare Other

## 2017-11-04 DIAGNOSIS — Z79899 Other long term (current) drug therapy: Secondary | ICD-10-CM | POA: Diagnosis not present

## 2017-11-04 DIAGNOSIS — I5032 Chronic diastolic (congestive) heart failure: Secondary | ICD-10-CM | POA: Diagnosis not present

## 2017-11-04 DIAGNOSIS — I493 Ventricular premature depolarization: Secondary | ICD-10-CM | POA: Diagnosis not present

## 2017-11-04 DIAGNOSIS — Z87891 Personal history of nicotine dependence: Secondary | ICD-10-CM | POA: Diagnosis not present

## 2017-11-04 DIAGNOSIS — J40 Bronchitis, not specified as acute or chronic: Secondary | ICD-10-CM

## 2017-11-04 DIAGNOSIS — E119 Type 2 diabetes mellitus without complications: Secondary | ICD-10-CM | POA: Insufficient documentation

## 2017-11-04 DIAGNOSIS — R0989 Other specified symptoms and signs involving the circulatory and respiratory systems: Secondary | ICD-10-CM | POA: Diagnosis not present

## 2017-11-04 DIAGNOSIS — N183 Chronic kidney disease, stage 3 (moderate): Secondary | ICD-10-CM | POA: Diagnosis not present

## 2017-11-04 DIAGNOSIS — J209 Acute bronchitis, unspecified: Secondary | ICD-10-CM | POA: Insufficient documentation

## 2017-11-04 DIAGNOSIS — I130 Hypertensive heart and chronic kidney disease with heart failure and stage 1 through stage 4 chronic kidney disease, or unspecified chronic kidney disease: Secondary | ICD-10-CM | POA: Diagnosis not present

## 2017-11-04 DIAGNOSIS — Z7982 Long term (current) use of aspirin: Secondary | ICD-10-CM | POA: Insufficient documentation

## 2017-11-04 DIAGNOSIS — R0602 Shortness of breath: Secondary | ICD-10-CM | POA: Diagnosis present

## 2017-11-04 LAB — BASIC METABOLIC PANEL
Anion gap: 11 (ref 5–15)
BUN: 26 mg/dL — AB (ref 6–20)
CHLORIDE: 111 mmol/L (ref 101–111)
CO2: 21 mmol/L — ABNORMAL LOW (ref 22–32)
Calcium: 9.7 mg/dL (ref 8.9–10.3)
Creatinine, Ser: 1.56 mg/dL — ABNORMAL HIGH (ref 0.44–1.00)
GFR calc Af Amer: 32 mL/min — ABNORMAL LOW (ref >60)
GFR, EST NON AFRICAN AMERICAN: 28 mL/min — AB (ref >60)
GLUCOSE: 128 mg/dL — AB (ref 65–99)
POTASSIUM: 4.4 mmol/L (ref 3.5–5.1)
SODIUM: 143 mmol/L (ref 135–145)

## 2017-11-04 LAB — CBC
HEMATOCRIT: 35.1 % — AB (ref 36.0–46.0)
Hemoglobin: 10.2 g/dL — ABNORMAL LOW (ref 12.0–15.0)
MCH: 28.3 pg (ref 26.0–34.0)
MCHC: 29.1 g/dL — ABNORMAL LOW (ref 30.0–36.0)
MCV: 97.5 fL (ref 78.0–100.0)
Platelets: 233 10*3/uL (ref 150–400)
RBC: 3.6 MIL/uL — ABNORMAL LOW (ref 3.87–5.11)
RDW: 15.9 % — ABNORMAL HIGH (ref 11.5–15.5)
WBC: 6.2 10*3/uL (ref 4.0–10.5)

## 2017-11-04 LAB — BRAIN NATRIURETIC PEPTIDE: B NATRIURETIC PEPTIDE 5: 171.9 pg/mL — AB (ref 0.0–100.0)

## 2017-11-04 LAB — I-STAT TROPONIN, ED: Troponin i, poc: 0.01 ng/mL (ref 0.00–0.08)

## 2017-11-04 MED ORDER — AMOXICILLIN-POT CLAVULANATE 875-125 MG PO TABS
1.0000 | ORAL_TABLET | Freq: Once | ORAL | Status: AC
  Administered 2017-11-04: 1 via ORAL
  Filled 2017-11-04: qty 1

## 2017-11-04 MED ORDER — PREDNISONE 20 MG PO TABS
60.0000 mg | ORAL_TABLET | Freq: Once | ORAL | Status: AC
  Administered 2017-11-04: 60 mg via ORAL
  Filled 2017-11-04: qty 3

## 2017-11-04 MED ORDER — AEROCHAMBER Z-STAT PLUS/MEDIUM MISC
1.0000 | Freq: Once | Status: AC
  Administered 2017-11-04: 1
  Filled 2017-11-04: qty 1

## 2017-11-04 MED ORDER — AMOXICILLIN-POT CLAVULANATE 875-125 MG PO TABS: 1.0000 | ORAL_TABLET | Freq: Two times a day (BID) | ORAL | 0 refills | Status: DC

## 2017-11-04 MED ORDER — IPRATROPIUM-ALBUTEROL 0.5-2.5 (3) MG/3ML IN SOLN
3.0000 mL | Freq: Once | RESPIRATORY_TRACT | Status: AC
  Administered 2017-11-04: 3 mL via RESPIRATORY_TRACT
  Filled 2017-11-04: qty 3

## 2017-11-04 MED ORDER — ALBUTEROL SULFATE HFA 108 (90 BASE) MCG/ACT IN AERS
2.0000 | INHALATION_SPRAY | Freq: Once | RESPIRATORY_TRACT | Status: AC
Start: 2017-11-04 — End: 2017-11-04
  Administered 2017-11-04: 2 via RESPIRATORY_TRACT
  Filled 2017-11-04: qty 13.4

## 2017-11-04 MED ORDER — PREDNISONE 20 MG PO TABS: 20.0000 mg | ORAL_TABLET | Freq: Two times a day (BID) | ORAL | 0 refills | Status: DC

## 2017-11-04 NOTE — ED Triage Notes (Signed)
Daughter stated, shes had an increase of SOB and some swelling going on in her feet and legs for 3-4 days

## 2017-11-04 NOTE — Telephone Encounter (Signed)
New Message   Pt c/o swelling: STAT is pt has developed SOB within 24 hours  1) How much weight have you gained and in what time span? no  2) If swelling, where is the swelling located? Both legs, ankles and feet  3) Are you currently taking a fluid pill? yes  4) Are you currently SOB? yes  5) Do you have a log of your daily weights (if so, list)? no  6) Have you gained 3 pounds in a day or 5 pounds in a week? no  7) Have you traveled recently? no

## 2017-11-04 NOTE — Discharge Instructions (Signed)
Use the albuterol inhaler 2 puffs every 3-4 hours as needed for cough or trouble breathing.  Start the prescriptions for Augmentin and prednisone, tomorrow.

## 2017-11-04 NOTE — ED Provider Notes (Signed)
[facility] Provider Note   CSN: [id number] Arrival date & time: 11/04/17  0954     History   Chief Complaint Chief Complaint  Patient presents with  . Shortness of Breath  . Chest Pain  . Leg Swelling  . Cough    HPI [name] is a 84 y.o. female.  HPI   The patient presents for evaluation of shortness of breath with cough productive of sputum which "looks like buttermilk."  The cough is been present for several days.  There is no associated fever, chills, nausea, vomiting, weakness or dizziness.  She has ongoing swelling in her legs.  She is worried that she is in heart failure, which prompted the visit today.  She states that her weight has been unchanged at home.  She is taking her medications as prescribed.  She does not smoke.  She does not take medications for bronchitis or asthma, although she has been prescribed albuterol in the past.  There are no other known modifying factors.    Past Medical History:  Diagnosis Date  . Arthritis   . Bilateral carpal tunnel syndrome   . Cataracts, both eyes   . Chronic diastolic CHF (congestive heart failure) ([place])    a. Echo (09/2012): Mild LVH, EF 65-70%, moderately elevated RVSP, moderate TR, mild to moderate MR, mild LAE, grade 2 diastolic dysfunction  . Diabetes mellitus [facility]  . Diabetic neuropathy (HCC) both hands and legs  . GERD (gastroesophageal reflux disease)   . Glaucoma   . Gout   . History of echocardiogram    Echo 1/19:  EF 65-70, no RWMA, Gr 2 DD, mild MR, severe LAE, PASP 62   . History of falling RECENT FALL 1 WK AGO--   NO INJURY  . 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 History of transfusion   . Hyperlipidemia   . Hypertension   . Mitral regurgitation   . PUD (peptic ulcer disease)    many yrs ago  . Right leg weakness   . Rotator cuff tear    left  . Slipped intervertebral disc   . Walker as ambulation aid     Patient Active  Problem List   Diagnosis Date Noted  . Dysphagia 07/28/2016  . Peripheral edema 07/02/2016  . CKD (chronic kidney disease), stage III ([place]) 05/30/2015  . GERD (gastroesophageal reflux disease) 05/30/2015  . Peripheral neuropathy 03/19/2015  . Diabetes mellitus, type II ([place]) 02/05/2015  . Spinal stenosis of lumbar region 02/05/2015  . Chronic diastolic CHF (congestive heart failure) ([place]) 05/01/2014  . Essential hypertension 05/01/2014  . Hyperlipidemia 05/01/2014    Past Surgical History:  Procedure Laterality Date  . ABDOMINAL HYSTERECTOMY  1966  . APPENDECTOMY    . CARDIOVASCULAR STRESS TEST  06-18-2005   DR Daneen Schick   NORMAL STUDY/ NO EVIDENCE ISCHEMIA/ EF 75%  . CARPAL TUNNEL RELEASE  09/17/2011   Procedure: CARPAL TUNNEL RELEASE;  Surgeon: Magnus Sinning, MD;  Location: [facility];  Service: Orthopedics;  Laterality: Right;  . CARPAL TUNNEL RELEASE  10/22/2011   Procedure: CARPAL TUNNEL RELEASE;  Surgeon: Magnus Sinning, MD;  Location: [facility];  Service: Orthopedics;  Laterality: Left;  . EYE SURGERY    . LUMBAR LAMINECTOMY/DECOMPRESSION MICRODISCECTOMY  01/14/2012   Procedure: LUMBAR LAMINECTOMY/DECOMPRESSION MICRODISCECTOMY 3 LEVELS;  Surgeon: Magnus Sinning, MD;  Location: [facility];  Service: Orthopedics;  Laterality: N/A;  Decompression Laminectomy of L2 - L3, L3 - L4 and L4 - L5 Central  (X-Ray)  . SHOULDER OPEN ROTATOR CUFF REPAIR  06/02/2012   Procedure: ROTATOR CUFF REPAIR SHOULDER OPEN;  Surgeon: Magnus Sinning, MD;  Location: [facility];  Service: Orthopedics;  Laterality: Left;  Left Shoulder Open Distal Clavicle Resection Anterior Acrominectomy and Rotator Cuff Repair  . SPINE SURGERY    . TRANSTHORACIC ECHOCARDIOGRAM  12-26-2008  DR Daneen Schick   NORMAL LVF/  EF  71%/   MILD ASYMMETRIC SEPTAL HYPERTROPHY/ MILD LEFT ATRIAL ENLARGEMENT/ MODERATELY ELEVATED ESTIMATED RIGHT VENTRICULAR SYSTOLIC PRESSURE/ MILD MITRAL  &  TRICUSPID  VALVE REGURG.     OB History   None      Home Medications    Prior to Admission medications   Medication Sig Start Date End Date Taking? Authorizing Provider  acetaminophen (TYLENOL) 500 MG tablet Take 1,500 mg by mouth every 6 (six) hours as needed for mild pain.   Yes [provider]  albuterol (PROVENTIL HFA;VENTOLIN HFA) 108 (90 Base) MCG/ACT inhaler Inhale 2 puffs into the lungs every 4 (four) hours as needed for wheezing or shortness of breath. 07/15/17  Yes [place], Modena Nunnery, MD  allopurinol (ZYLOPRIM) 300 MG tablet TAKE 1 TABLET BY MOUTH  DAILY WITH BREAKFAST Patient taking differently: TAKE 1 TABLET(300mg ) BY MOUTH  DAILY WITH BREAKFAST 08/12/17  Yes [place], Modena Nunnery, MD  amLODipine (NORVASC) 5 MG tablet TAKE 1 TABLET BY MOUTH  DAILY Patient taking differently: TAKE 1 TABLET(5mg ) BY MOUTH  DAILY 07/13/17  Yes [place], Modena Nunnery, MD  aspirin EC 81 MG tablet Take 81 mg by mouth daily.   Yes [provider]  diclofenac sodium (VOLTAREN) 1 % GEL Apply 2 g topically 4 (four) times daily. 05/22/17  Yes [place], Modena Nunnery, MD  dorzolamide-timolol (COSOPT) 22.3-6.8 MG/ML ophthalmic solution Place 1 drop into the right eye 2 (two) times daily.   Yes [provider]  esomeprazole (NEXIUM) 40 MG capsule Take 1 capsule (40 mg total) by mouth daily. 05/14/17  Yes [place], Modena Nunnery, MD  furosemide (LASIX) 40 MG tablet Take 40 mg by mouth 3 (three) times a week. Mon, Wed, Fri   Yes [provider]  gabapentin (NEURONTIN) 100 MG capsule TAKE 1 CAPSULE BY MOUTH AT  BEDTIME Patient taking differently: TAKE 1 CAPSULE (100mg )BY MOUTH AT  BEDTIME 10/23/17  Yes [place], Modena Nunnery, MD  hydrALAZINE (APRESOLINE) 10 MG tablet TAKE 1 TABLET BY MOUTH TWO  TIMES DAILY Patient taking differently: TAKE 1 TABLET(10mg ) BY MOUTH TWO  TIMES DAILY 03/17/17  Yes [place], Modena Nunnery, MD  latanoprost (XALATAN) 0.005 % ophthalmic solution Place 1 drop into the right eye 2 (two) times daily.   10/16/15  Yes [provider]  lisinopril (PRINIVIL,ZESTRIL) 40 MG tablet TAKE 1 TABLET BY MOUTH  EVERY MORNING Patient taking differently: TAKE 1 TABLET(40mg ) BY MOUTH  EVERY MORNING 08/12/17  Yes [place], Modena Nunnery, MD  meloxicam (MOBIC) 15 MG tablet TAKE 1 TABLET BY MOUTH  DAILY Patient taking differently: TAKE 1 TABLET (15mg )BY MOUTH  DAILY 07/13/17  Yes [place], Modena Nunnery, MD  pantoprazole (PROTONIX) 40 MG tablet TAKE 1 TABLET BY MOUTH  DAILY Patient taking differently: TAKE 1 TABLET(40mg ) BY MOUTH  DAILY 02/25/17  Yes , Modena Nunnery, MD  pravastatin (PRAVACHOL) 20 MG tablet TAKE 1 TABLET BY MOUTH  EVERY EVENING Patient taking differently: TAKE 1 TABLET(20mg ) BY MOUTH  EVERY EVENING 10/15/17  Yes [place], [place],  MD  ranitidine (ZANTAC) 150 MG tablet TAKE 1 TABLET BY MOUTH AT  BEDTIME Patient taking differently: TAKE 1 TABLET (150mg )BY MOUTH AT  BEDTIME 10/15/17  Yes [place], Modena Nunnery, MD  timolol (BETIMOL) 0.5 % ophthalmic solution Place 1 drop into the left eye daily.   Yes [provider]  amoxicillin-clavulanate (AUGMENTIN) 875-125 MG tablet Take 1 tablet by mouth 2 (two) times daily. One po bid x 7 days 11/04/17   Daleen Bo, MD  hydrALAZINE (APRESOLINE) 10 MG tablet TAKE 1 TABLET BY MOUTH TWO  TIMES DAILY Patient not taking: Reported on 11/04/2017 10/15/17   Alycia Rossetti, MD  predniSONE (DELTASONE) 20 MG tablet Take 1 tablet (20 mg total) by mouth 2 (two) times daily. 11/04/17   Daleen Bo, MD    Family History Family History  Problem Relation Age of Onset  . Hypertension Father   . Hypertension Mother   . Stroke Mother   . Diabetes Sister   . Hypertension Sister   . Cancer Sister   . Arthritis Sister   . Hypertension Brother   . Alcohol abuse Brother   . Hypertension Sister   . Arthritis Sister   . Hypertension Sister   . Arthritis Sister   . Arthritis Sister   . Arthritis Sister   . Heart attack Son   . Colon cancer Neg Hx     Social  History Social History   Tobacco Use  . Smoking status: Former Smoker    Packs/day: 1.00    Years: 30.00    Pack years: 30.00    Types: Cigarettes    Last attempt to quit: 05/28/1968    Years since quitting: 49.4  . Smokeless tobacco: Never Used  Substance Use Topics  . Alcohol use: No  . Drug use: No     Allergies   Patient has no known allergies.   Review of Systems Review of Systems  All other systems reviewed and are negative.    Physical Exam Updated Vital Signs BP (!) 160/59   Pulse 78   Temp (!) 97.5 F (36.4 C) (Oral)   Resp (!) 21   Ht 5\' 4"  (1.626 m)   Wt 84.1 kg (185 lb 8 oz)   SpO2 98%   BMI 31.84 kg/m   Physical Exam  Constitutional: She is oriented to person, place, and time. She appears well-developed and well-nourished.  HENT:  Head: Normocephalic and atraumatic.  Eyes: Pupils are equal, round, and reactive to light. Conjunctivae and EOM are normal.  Neck: Normal range of motion and phonation normal. Neck supple.  Cardiovascular: Normal rate and regular rhythm.  Pulmonary/Chest: Effort normal. No stridor. No respiratory distress. She has wheezes (Scattered). She has no rales. She exhibits no tenderness.  Abdominal: Soft. She exhibits no distension. There is no tenderness. There is no guarding.  Musculoskeletal: Normal range of motion. She exhibits edema (4+ edema both legs bilaterally.).  Neurological: She is alert and oriented to person, place, and time. She exhibits normal muscle tone.  Skin: Skin is warm and dry.  Psychiatric: She has a normal mood and affect. Her behavior is normal. Judgment and thought content normal.  Nursing note and vitals reviewed.    ED Treatments / Results  Labs (all labs ordered are listed, but only abnormal results are displayed) Labs Reviewed  BASIC METABOLIC PANEL - Abnormal; Notable for the following components:      Result Value   CO2 21 (*)    Glucose, Bld 128 (*)  BUN 26 (*)    Creatinine, Ser 1.56  (*)    GFR calc non Af Amer 28 (*)    GFR calc Af Amer 32 (*)    All other components within normal limits  CBC - Abnormal; Notable for the following components:   RBC 3.60 (*)    Hemoglobin 10.2 (*)    HCT 35.1 (*)    MCHC 29.1 (*)    RDW 15.9 (*)    All other components within normal limits  BRAIN NATRIURETIC PEPTIDE - Abnormal; Notable for the following components:   B Natriuretic Peptide 171.9 (*)    All other components within normal limits  I-STAT TROPONIN, ED    EKG EKG Interpretation  Date/Time:  Wednesday November 04 2017 10:00:03 EDT Ventricular Rate:  75 PR Interval:  134 QRS Duration: 108 QT Interval:  384 QTC Calculation: 428 R Axis:   74 Text Interpretation:  Sinus rhythm with Premature supraventricular complexes Ventricular pre-excitation, WPW pattern type B Abnormal ECG since last tracing no significant change Confirmed by Daleen Bo [phone]) on 11/04/2017 2:30:31 PM   Radiology Dg Chest 2 View  Result Date: 11/04/2017 CLINICAL DATA:  Swelling in ankles and legs. EXAM: CHEST - 2 VIEW COMPARISON:  07/04/2017. FINDINGS: Mild cardiac enlargement. Mild vascular congestion. No consolidation or edema. No significant effusion or pneumothorax. Degenerative change both shoulders. Worsening aeration from priors. IMPRESSION: Mild cardiac enlargement. Mild vascular congestion without consolidation or edema. Electronically Signed   By: Staci Righter M.D.   On: 11/04/2017 10:44    Procedures Procedures (including critical care time)  Medications Ordered in ED Medications  ipratropium-albuterol (DUONEB) 0.5-2.5 (3) MG/3ML nebulizer solution 3 mL (3 mLs Nebulization Given 11/04/17 1305)  predniSONE (DELTASONE) tablet 60 mg (60 mg Oral Given 11/04/17 1635)  amoxicillin-clavulanate (AUGMENTIN) 875-125 MG per tablet 1 tablet (1 tablet Oral Given 11/04/17 1635)  albuterol (PROVENTIL HFA;VENTOLIN HFA) 108 (90 Base) MCG/ACT inhaler 2 puff (2 puffs Inhalation Given 11/04/17 1636)    aerochamber Z-Stat Plus/medium 1 each (1 each Other Given 11/04/17 1636)     Initial Impression / Assessment and Plan / ED Course  I have reviewed the triage vital signs and the nursing notes.  Pertinent labs & imaging results that were available during my care of the patient were reviewed by me and considered in my medical decision making (see chart for details).  Clinical Course as of Nov 05 1639  Wed Nov 04, 2017  1426 Weight today 84.1 kg.  Comparative weight from 09/10/17, in her physician office was 83.0 kg.   [EW]  1427 I-stat troponin, ED [EW]  1427 Normal  I-stat troponin, ED [EW]  1428 Normal except hemoglobin low 10.2   [EW]  1428 Mild elevation  Brain natriuretic peptide(!) [EW]  1428 Normal except CO2 low, glucose high, BUN high, creatinine high, GFR low  Basic metabolic panel(!) [EW]  5427 Trending of BUN and creatinine over the last 4 months indicate that these values are relatively stable.   [EW]  1429 Mild vascular congestion, images reviewed  DG Chest 2 View [EW]    Clinical Course User Index [EW] Daleen Bo, MD     Patient Vitals for the past 24 hrs:  BP Temp Temp src Pulse Resp SpO2 Height Weight  11/04/17 1635 (!) 160/59 - - - (!) 21 - - -  11/04/17 1630 - - - 78 19 98 % - -  11/04/17 1405 - - - - - - - 84.1 kg (185 lb  8 oz)  11/04/17 1345 - - - 80 (!) 22 99 % - -  11/04/17 1245 130/67 - - 69 18 98 % - -  11/04/17 1230 (!) 145/63 - - 71 (!) 21 99 % - -  11/04/17 1215 126/83 - - 72 17 98 % - -  11/04/17 1200 127/65 - - 67 (!) 21 99 % - -  11/04/17 1145 139/70 - - 78 19 99 % - -  11/04/17 1009 - - - - - - 5\' 4"  (1.626 m) 83 kg (183 lb)  11/04/17 1003 (!) 154/66 (!) 97.5 F (36.4 C) Oral 77 16 98 % - -    4:15 PM Reevaluation with update and discussion. After initial assessment and treatment, an updated evaluation reveals patient states she feels much better after the nebulizer, and would like to have one like it at home.  She continues to deny  using an inhaler in the past however her medicine list indicates that she got one from her PCP in February 2019.  Repeat vital signs are reassuring.  Findings discussed with the patient and all questions were answered. Daleen Bo   Medical Decision Making: Cough with abnormal lung exam consistent with bronchitis.  Doubt pneumonia, ACS or PE.  Doubt impending vascular collapse or metabolic instability.  CRITICAL CARE-no Performed by: Daleen Bo   Nursing Notes Reviewed/ Care Coordinated Applicable Imaging Reviewed Interpretation of Laboratory Data incorporated into ED treatment  The patient appears reasonably screened and/or stabilized for discharge and I doubt any other medical condition or other [facility] requiring further screening, evaluation, or treatment in the ED at this time prior to discharge.  Plan: Home Medications-continue usual medications, discharge with albuterol inhaler and prescriptions; Home Treatments-rest, fluids; return here if the recommended treatment, does not improve the symptoms; Recommended follow up-PCP follow-up 1 week and as needed.     Final Clinical Impressions(s) / ED Diagnoses   Final diagnoses:  Bronchitis    ED Discharge Orders        Ordered    predniSONE (DELTASONE) 20 MG tablet  2 times daily     11/04/17 1633    amoxicillin-clavulanate (AUGMENTIN) 875-125 MG tablet  2 times daily     11/04/17 1633       Daleen Bo, MD 11/04/17 1641

## 2017-11-04 NOTE — Telephone Encounter (Signed)
Spoke with daughter, Rodney Cruise, [place] on file.  She states that pt has swelling in ankles, legs, feet/toes.  Pt gets SOB just walking from bed to bathroom.  Daughter states she even gets SOB when she goes from lying to sitting on the side of the bed.  Denies increased salt in diet.  Pt has been coughing and wheezing for several days.  No weights available.  Currently taking Hydralazine 10mg  BID and Furosemide 40mg  QD.  PCP started pt back on Furosemide according to daughter.  Scheduled pt to see NP tomorrow.  Further discussed SOB and swelling with daughter.  Edema is pitting with very slow recoil.  SOB is basically constant and cough and productive with wheezing for several days.  Asked daughter if she felt pt needed to go to ER.  Daughter feels she should.  Advised her to take pt to ER for eval.  Daughter will bring pt to [facility].  Will route to Dr. Tamala Julian to make him aware.

## 2017-11-05 ENCOUNTER — Ambulatory Visit: Payer: Medicare Other | Admitting: Adult Health

## 2017-11-30 ENCOUNTER — Encounter: Payer: Self-pay | Admitting: Physician Assistant

## 2017-11-30 ENCOUNTER — Other Ambulatory Visit: Payer: Self-pay

## 2017-11-30 ENCOUNTER — Ambulatory Visit (INDEPENDENT_AMBULATORY_CARE_PROVIDER_SITE_OTHER): Payer: Medicare Other | Admitting: Physician Assistant

## 2017-11-30 DIAGNOSIS — D692 Other nonthrombocytopenic purpura: Secondary | ICD-10-CM

## 2017-11-30 NOTE — Progress Notes (Signed)
Patient ID: [name] MRN: [id number], DOB: 1923-06-05, 84 y.o. Date of Encounter: 11/30/2017, 2:32 PM    Chief Complaint:  Chief Complaint  Patient presents with  . bruises on both arms     HPI: 84 y.o. year old female presents with above.   She is accompanied by her daughter who states that she is her caregiver and is with her 24/7. He notes that she usually sees Dr. Buelah Manis as her PCP but came in today to check regarding dark spots on her arms.  They had recently noticed these areas and just wanted to have them evaluated.  She reports that she has had no trauma or injury to the arms that she is aware of.  Has had no falls. Had no bleeding or other areas of changes on the skin only on the arms.  No other concerns to address today.     Home Meds:   Outpatient Medications Prior to Visit  Medication Sig Dispense Refill  . acetaminophen (TYLENOL) 500 MG tablet Take 1,500 mg by mouth every 6 (six) hours as needed for mild pain.    Marland Kitchen albuterol (PROVENTIL HFA;VENTOLIN HFA) 108 (90 Base) MCG/ACT inhaler Inhale 2 puffs into the lungs every 4 (four) hours as needed for wheezing or shortness of breath. 1 Inhaler 0  . allopurinol (ZYLOPRIM) 300 MG tablet TAKE 1 TABLET BY MOUTH  DAILY WITH BREAKFAST (Patient taking differently: TAKE 1 TABLET(300mg ) BY MOUTH  DAILY WITH BREAKFAST) 90 tablet 1  . amLODipine (NORVASC) 5 MG tablet TAKE 1 TABLET BY MOUTH  DAILY (Patient taking differently: TAKE 1 TABLET(5mg ) BY MOUTH  DAILY) 90 tablet 1  . aspirin EC 81 MG tablet Take 81 mg by mouth daily.    . dorzolamide-timolol (COSOPT) 22.3-6.8 MG/ML ophthalmic solution Place 1 drop into the right eye 2 (two) times daily.    Marland Kitchen esomeprazole (NEXIUM) 40 MG capsule Take 1 capsule (40 mg total) by mouth daily. 90 capsule 2  . furosemide (LASIX) 40 MG tablet Take 40 mg by mouth 3 (three) times a week. Mon, Wed, Fri    . gabapentin (NEURONTIN) 100 MG capsule TAKE 1 CAPSULE BY MOUTH AT  BEDTIME (Patient taking  differently: TAKE 1 CAPSULE (100mg )BY MOUTH AT  BEDTIME) 90 capsule 3  . hydrALAZINE (APRESOLINE) 10 MG tablet TAKE 1 TABLET BY MOUTH TWO  TIMES DAILY (Patient taking differently: TAKE 1 TABLET(10mg ) BY MOUTH TWO  TIMES DAILY) 180 tablet 1  . lisinopril (PRINIVIL,ZESTRIL) 40 MG tablet TAKE 1 TABLET BY MOUTH  EVERY MORNING (Patient taking differently: TAKE 1 TABLET(40mg ) BY MOUTH  EVERY MORNING) 90 tablet 3  . meloxicam (MOBIC) 15 MG tablet TAKE 1 TABLET BY MOUTH  DAILY (Patient taking differently: TAKE 1 TABLET (15mg )BY MOUTH  DAILY) 90 tablet 1  . pantoprazole (PROTONIX) 40 MG tablet TAKE 1 TABLET BY MOUTH  DAILY (Patient taking differently: TAKE 1 TABLET(40mg ) BY MOUTH  DAILY) 90 tablet 1  . pravastatin (PRAVACHOL) 20 MG tablet TAKE 1 TABLET BY MOUTH  EVERY EVENING (Patient taking differently: TAKE 1 TABLET(20mg ) BY MOUTH  EVERY EVENING) 90 tablet 1  . ranitidine (ZANTAC) 150 MG tablet TAKE 1 TABLET BY MOUTH AT  BEDTIME (Patient taking differently: TAKE 1 TABLET (150mg )BY MOUTH AT  BEDTIME) 90 tablet 1  . timolol (BETIMOL) 0.5 % ophthalmic solution Place 1 drop into the left eye daily.    Marland Kitchen latanoprost (XALATAN) 0.005 % ophthalmic solution Place 1 drop into the right eye 2 (two) times daily.     Marland Kitchen  diclofenac sodium (VOLTAREN) 1 % GEL Apply 2 g topically 4 (four) times daily. (Patient not taking: Reported on 11/30/2017) 300 g 3  . amoxicillin-clavulanate (AUGMENTIN) 875-125 MG tablet Take 1 tablet by mouth 2 (two) times daily. One po bid x 7 days 14 tablet 0  . hydrALAZINE (APRESOLINE) 10 MG tablet TAKE 1 TABLET BY MOUTH TWO  TIMES DAILY (Patient not taking: Reported on 11/04/2017) 180 tablet 3  . predniSONE (DELTASONE) 20 MG tablet Take 1 tablet (20 mg total) by mouth 2 (two) times daily. 10 tablet 0   No facility-administered medications prior to visit.     Allergies: No Known Allergies    Review of Systems: See HPI for pertinent ROS. All other ROS negative.    Physical Exam: Blood pressure  124/78, pulse 68, temperature 97.7 F (36.5 C), temperature source Oral, resp. rate 18, height 5\' 5"  (1.651 m), weight 84.4 kg (186 lb), SpO2 98 %., Body mass index is 30.95 kg/m. General: AAF.  Appears in no acute distress. Neck: Supple. No thyromegaly. No lymphadenopathy. Lungs: Clear bilaterally to auscultation without wheezes, rales, or rhonchi. Breathing is unlabored. Heart: Regular rhythm. No murmurs, rubs, or gallops. Msk:  Strength and tone normal for age. Extremities/Skin: Warm and dry.  No LE edema.  On her forearms bilaterally----- She has areas of diffuse ecchymosis-- dark purple under the skin.  Her skin is very thin.  Neuro: Alert and oriented X 3. Moves all extremities spontaneously. Gait is normal. CNII-XII grossly in tact. Psych:  Responds to questions appropriately with a normal affect.     ASSESSMENT AND PLAN:  84 y.o. year old female with  1. Senile purpura ([place]) Reviewed that she had a CBC on 11/04/2017.  That lab finding was stable.  Platelet count was 233. Reassured patient and daughter that findings are consistent with senile purpura and that this commonly occurs with aging.  Discussed that as people get older their skin becomes thinner and this is commonly seen with that.  They feel reassured.  They are to monitor and follow-up if develops additional signs or symptoms.   [address] [place], [place], [facility] 11/30/2017 2:32 PM

## 2018-01-18 ENCOUNTER — Other Ambulatory Visit: Payer: Self-pay | Admitting: Family Medicine

## 2018-02-23 ENCOUNTER — Ambulatory Visit (INDEPENDENT_AMBULATORY_CARE_PROVIDER_SITE_OTHER): Payer: Medicare Other | Admitting: Family Medicine

## 2018-02-23 ENCOUNTER — Encounter: Payer: Self-pay | Admitting: Family Medicine

## 2018-02-23 VITALS — BP 118/50 | HR 68 | Temp 97.6°F | Resp 16 | Ht 65.0 in | Wt 187.0 lb

## 2018-02-23 DIAGNOSIS — K625 Hemorrhage of anus and rectum: Secondary | ICD-10-CM

## 2018-02-23 LAB — CBC WITH DIFFERENTIAL/PLATELET
BASOS PCT: 0.3 %
Basophils Absolute: 20 cells/uL (ref 0–200)
EOS PCT: 2.9 %
Eosinophils Absolute: 189 cells/uL (ref 15–500)
HCT: 29 % — ABNORMAL LOW (ref 35.0–45.0)
Hemoglobin: 9.4 g/dL — ABNORMAL LOW (ref 11.7–15.5)
Lymphs Abs: 1014 cells/uL (ref 850–3900)
MCH: 28.9 pg (ref 27.0–33.0)
MCHC: 32.4 g/dL (ref 32.0–36.0)
MCV: 89.2 fL (ref 80.0–100.0)
MPV: 11 fL (ref 7.5–12.5)
Monocytes Relative: 7.7 %
Neutro Abs: 4778 cells/uL (ref 1500–7800)
Neutrophils Relative %: 73.5 %
PLATELETS: 233 10*3/uL (ref 140–400)
RBC: 3.25 10*6/uL — AB (ref 3.80–5.10)
RDW: 16.2 % — AB (ref 11.0–15.0)
TOTAL LYMPHOCYTE: 15.6 %
WBC mixed population: 501 cells/uL (ref 200–950)
WBC: 6.5 10*3/uL (ref 3.8–10.8)

## 2018-02-23 LAB — COMPLETE METABOLIC PANEL WITH GFR
AG Ratio: 1.7 (calc) (ref 1.0–2.5)
ALT: 6 U/L (ref 6–29)
AST: 13 U/L (ref 10–35)
Albumin: 4.3 g/dL (ref 3.6–5.1)
Alkaline phosphatase (APISO): 145 U/L — ABNORMAL HIGH (ref 33–130)
BUN/Creatinine Ratio: 22 (calc) (ref 6–22)
BUN: 34 mg/dL — AB (ref 7–25)
CALCIUM: 9.7 mg/dL (ref 8.6–10.4)
CO2: 19 mmol/L — AB (ref 20–32)
CREATININE: 1.54 mg/dL — AB (ref 0.60–0.88)
Chloride: 109 mmol/L (ref 98–110)
GFR, Est African American: 34 mL/min/{1.73_m2} — ABNORMAL LOW (ref >=60)
GFR, Est Non African American: 29 mL/min/{1.73_m2} — ABNORMAL LOW (ref >=60)
GLUCOSE: 115 mg/dL — AB (ref 65–99)
Globulin: 2.6 g/dL (calc) (ref 1.9–3.7)
POTASSIUM: 4.6 mmol/L (ref 3.5–5.3)
Sodium: 140 mmol/L (ref 135–146)
Total Bilirubin: 0.4 mg/dL (ref 0.2–1.2)
Total Protein: 6.9 g/dL (ref 6.1–8.1)

## 2018-02-23 MED ORDER — HYDROCORTISONE ACETATE 25 MG RE SUPP: 25.0000 mg | Freq: Two times a day (BID) | RECTAL | 0 refills | Status: DC

## 2018-02-23 NOTE — Progress Notes (Signed)
Subjective:    Patient ID: [name], female    DOB: [birth year], 84 y.o.   MRN: [id number]  HPI Patient reports bright red blood per rectum over the last 2 to 3 days.  There is blood in the toilet bowl with defecation.  It is not a copious amount but it is visible.  There is also blood when she wipes.  She denies any pain with defecation.  She denies any tearing with defecation.  She denies any itching or burning around the rectum.  She denies any constipation or diarrhea or fevers or chills or abdominal pain.  Otherwise she feels normal.  She is taking aspirin and meloxicam.  On physical exam today, the patient has a small external hemorrhoid that is not actively bleeding.  There is no palpable mass on rectal exam.  Anoscopy was performed area there are numerous internal hemorrhoids visualized and blood was seen on an internal hemorrhoid at approximately 3:00.  This was a small amount of blood.  There was no active source of bleeding seen.   Patient's blood pressure today is 118/50.  Heart rate is stable at 68.  She denies any dizziness, chest pain, shortness of breath, syncope, near syncope.  She denies feeling weak. Past Medical History:  Diagnosis Date  . Arthritis   . Bilateral carpal tunnel syndrome   . Cataracts, both eyes   . Chronic diastolic CHF (congestive heart failure) ([place])    a. Echo (09/2012): Mild LVH, EF 65-70%, moderately elevated RVSP, moderate TR, mild to moderate MR, mild LAE, grade 2 diastolic dysfunction  . Diabetes mellitus [facility]  . Diabetic neuropathy (HCC) both hands and legs  . GERD (gastroesophageal reflux disease)   . Glaucoma   . Gout   . History of echocardiogram    Echo 1/19:  EF 65-70, no RWMA, Gr 2 DD, mild MR, severe LAE, PASP 62   . History of falling RECENT FALL 1 WK AGO--   NO INJURY  . 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 History of transfusion   . Hyperlipidemia   . Hypertension   . Mitral regurgitation   . PUD  (peptic ulcer disease)    many yrs ago  . Right leg weakness   . Rotator cuff tear    left  . Slipped intervertebral disc   . Walker as ambulation aid    Past Surgical History:  Procedure Laterality Date  . ABDOMINAL HYSTERECTOMY  1966  . APPENDECTOMY    . CARDIOVASCULAR STRESS TEST  06-18-2005   DR Daneen Schick   NORMAL STUDY/ NO EVIDENCE ISCHEMIA/ EF 75%  . CARPAL TUNNEL RELEASE  09/17/2011   Procedure: CARPAL TUNNEL RELEASE;  Surgeon: Magnus Sinning, MD;  Location: [facility];  Service: Orthopedics;  Laterality: Right;  . CARPAL TUNNEL RELEASE  10/22/2011   Procedure: CARPAL TUNNEL RELEASE;  Surgeon: Magnus Sinning, MD;  Location: [facility];  Service: Orthopedics;  Laterality: Left;  . EYE SURGERY    . LUMBAR LAMINECTOMY/DECOMPRESSION MICRODISCECTOMY  01/14/2012   Procedure: LUMBAR LAMINECTOMY/DECOMPRESSION MICRODISCECTOMY 3 LEVELS;  Surgeon: Magnus Sinning, MD;  Location: [facility];  Service: Orthopedics;  Laterality: N/A;  Decompression Laminectomy of L2 - L3, L3 - L4 and L4 - L5 Central  (X-Ray)  . SHOULDER OPEN ROTATOR CUFF REPAIR  06/02/2012   Procedure: ROTATOR CUFF REPAIR SHOULDER OPEN;  Surgeon: Magnus Sinning, MD;  Location: [facility];  Service: Orthopedics;  Laterality: Left;  Left Shoulder Open Distal Clavicle Resection Anterior Acrominectomy and Rotator Cuff Repair  . SPINE SURGERY    . TRANSTHORACIC ECHOCARDIOGRAM  12-26-2008  DR Daneen Schick   NORMAL LVF/  EF  71%/   MILD ASYMMETRIC SEPTAL HYPERTROPHY/ MILD LEFT ATRIAL ENLARGEMENT/ MODERATELY ELEVATED ESTIMATED RIGHT VENTRICULAR SYSTOLIC PRESSURE/ MILD MITRAL  &  TRICUSPID VALVE REGURG.   Current Outpatient Medications on File Prior to Visit  Medication Sig Dispense Refill  . acetaminophen (TYLENOL) 500 MG tablet Take 1,500 mg by mouth every 6 (six) hours as needed for mild pain.    Marland Kitchen albuterol (PROVENTIL HFA;VENTOLIN HFA) 108 (90 Base) MCG/ACT inhaler Inhale 2 puffs into the lungs every  4 (four) hours as needed for wheezing or shortness of breath. 1 Inhaler 0  . allopurinol (ZYLOPRIM) 300 MG tablet TAKE 1 TABLET BY MOUTH  DAILY WITH BREAKFAST (Patient taking differently: TAKE 1 TABLET(300mg ) BY MOUTH  DAILY WITH BREAKFAST) 90 tablet 1  . amLODipine (NORVASC) 5 MG tablet TAKE 1 TABLET(5mg ) BY MOUTH  DAILY 90 tablet 1  . aspirin EC 81 MG tablet Take 81 mg by mouth daily.    . dorzolamide-timolol (COSOPT) 22.3-6.8 MG/ML ophthalmic solution Place 1 drop into the right eye 2 (two) times daily.    Marland Kitchen esomeprazole (NEXIUM) 40 MG capsule Take 1 capsule (40 mg total) by mouth daily. 90 capsule 2  . furosemide (LASIX) 40 MG tablet Take 40 mg by mouth 3 (three) times a week. Mon, Wed, Fri    . gabapentin (NEURONTIN) 100 MG capsule TAKE 1 CAPSULE BY MOUTH AT  BEDTIME (Patient taking differently: TAKE 1 CAPSULE (100mg )BY MOUTH AT  BEDTIME) 90 capsule 3  . hydrALAZINE (APRESOLINE) 10 MG tablet TAKE 1 TABLET BY MOUTH TWO  TIMES DAILY (Patient taking differently: TAKE 1 TABLET(10mg ) BY MOUTH TWO  TIMES DAILY) 180 tablet 1  . lisinopril (PRINIVIL,ZESTRIL) 40 MG tablet TAKE 1 TABLET BY MOUTH  EVERY MORNING (Patient taking differently: TAKE 1 TABLET(40mg ) BY MOUTH  EVERY MORNING) 90 tablet 3  . meloxicam (MOBIC) 15 MG tablet TAKE 1 TABLET (15mg )BY MOUTH  DAILY 90 tablet 1  . pantoprazole (PROTONIX) 40 MG tablet TAKE 1 TABLET BY MOUTH  DAILY (Patient taking differently: TAKE 1 TABLET(40mg ) BY MOUTH  DAILY) 90 tablet 1  . pravastatin (PRAVACHOL) 20 MG tablet TAKE 1 TABLET BY MOUTH  EVERY EVENING (Patient taking differently: TAKE 1 TABLET(20mg ) BY MOUTH  EVERY EVENING) 90 tablet 1  . ranitidine (ZANTAC) 150 MG tablet TAKE 1 TABLET BY MOUTH AT  BEDTIME (Patient taking differently: TAKE 1 TABLET (150mg )BY MOUTH AT  BEDTIME) 90 tablet 1  . timolol (BETIMOL) 0.5 % ophthalmic solution Place 1 drop into the left eye daily.    . diclofenac sodium (VOLTAREN) 1 % GEL Apply 2 g topically 4 (four) times daily.  (Patient not taking: Reported on 11/30/2017) 300 g 3   No current facility-administered medications on file prior to visit.    No Known Allergies Social History   Socioeconomic History  . Marital status: Widowed    Spouse name: Not on file  . Number of children: Not on file  . Years of education: Not on file  . Highest education level: Not on file  Occupational History  . Not on file  Social Needs  . Financial resource strain: Not on file  . Food insecurity:    Worry: Not on file    Inability: Not on file  . Transportation  needs:    Medical: Not on file    Non-medical: Not on file  Tobacco Use  . Smoking status: Former Smoker    Packs/day: 1.00    Years: 30.00    Pack years: 30.00    Types: Cigarettes    Last attempt to quit: 05/28/1968    Years since quitting: 49.7  . Smokeless tobacco: Never Used  Substance and Sexual Activity  . Alcohol use: No  . Drug use: No  . Sexual activity: Not on file  Lifestyle  . Physical activity:    Days per week: Not on file    Minutes per session: Not on file  . Stress: Not on file  Relationships  . Social connections:    Talks on phone: Not on file    Gets together: Not on file    Attends religious service: Not on file    Active member of club or organization: Not on file    Attends meetings of clubs or organizations: Not on file    Relationship status: Not on file  . Intimate partner violence:    Fear of current or ex partner: Not on file    Emotionally abused: Not on file    Physically abused: Not on file    Forced sexual activity: Not on file  Other Topics Concern  . Not on file  Social History Narrative  . Not on file    Review of Systems  All other systems reviewed and are negative.      Objective:   Physical Exam  Constitutional: She appears well-developed and well-nourished. No distress.  Cardiovascular: Normal rate, regular rhythm and normal heart sounds.  Pulmonary/Chest: Effort normal and breath sounds  normal. No stridor. No respiratory distress. She has no wheezes. She has no rales. She exhibits no tenderness.  Abdominal: Soft. Bowel sounds are normal. She exhibits no distension and no mass. There is no tenderness. There is no rebound and no guarding.  Genitourinary: Rectal exam shows external hemorrhoid, internal hemorrhoid and guaiac positive stool. Rectal exam shows no fissure, no mass, no tenderness and anal tone normal.  Skin: She is not diaphoretic.  Vitals reviewed.         Assessment & Plan:  BRBPR (bright red blood per rectum) - Plan: CBC with Differential/Platelet, COMPLETE METABOLIC PANEL WITH GFR  Patient definitely has internal hemorrhoids however I cannot be 511% certain that these are the sole source of bleeding.  I will treat the patient with you saw [facility] suppositories, 1 per rectum twice daily for 6 days.  I recommended that she discontinue aspirin and meloxicam immediately.  I have recommended that she not straining with defecation.  I recommended that she use Linzess 72 mcg p.o. daily to promote soft easy stools.  I will check a CBC today to evaluate for anemia.  Patient is stable today with no evidence of hypotension, tachycardia or shock.  Therefore if labs are normal, I would monitor this over the next week.  If bleeding does not stop, we will need to consult GI for possible sigmoidoscopy to evaluate further.  If the bleeding intensifies, the patient is directed to go immediately to the emergency room.  Both she and her family agree.

## 2018-02-26 ENCOUNTER — Other Ambulatory Visit: Payer: Self-pay | Admitting: Family Medicine

## 2018-02-26 DIAGNOSIS — K921 Melena: Secondary | ICD-10-CM

## 2018-02-26 DIAGNOSIS — D649 Anemia, unspecified: Secondary | ICD-10-CM

## 2018-03-02 ENCOUNTER — Other Ambulatory Visit: Payer: Medicare Other

## 2018-03-02 DIAGNOSIS — D649 Anemia, unspecified: Secondary | ICD-10-CM | POA: Diagnosis not present

## 2018-03-02 DIAGNOSIS — K921 Melena: Secondary | ICD-10-CM

## 2018-03-02 LAB — CBC WITH DIFFERENTIAL/PLATELET
Basophils Absolute: 31 cells/uL (ref 0–200)
Basophils Relative: 0.5 %
EOS PCT: 3.1 %
Eosinophils Absolute: 189 cells/uL (ref 15–500)
HEMATOCRIT: 28.3 % — AB (ref 35.0–45.0)
Hemoglobin: 8.9 g/dL — ABNORMAL LOW (ref 11.7–15.5)
LYMPHS ABS: 1446 {cells}/uL (ref 850–3900)
MCH: 28.7 pg (ref 27.0–33.0)
MCHC: 31.4 g/dL — ABNORMAL LOW (ref 32.0–36.0)
MCV: 91.3 fL (ref 80.0–100.0)
MONOS PCT: 11.3 %
MPV: 10.3 fL (ref 7.5–12.5)
NEUTROS ABS: 3745 {cells}/uL (ref 1500–7800)
NEUTROS PCT: 61.4 %
Platelets: 237 10*3/uL (ref 140–400)
RBC: 3.1 10*6/uL — AB (ref 3.80–5.10)
RDW: 16.4 % — AB (ref 11.0–15.0)
Total Lymphocyte: 23.7 %
WBC mixed population: 689 cells/uL (ref 200–950)
WBC: 6.1 10*3/uL (ref 3.8–10.8)

## 2018-03-04 ENCOUNTER — Other Ambulatory Visit: Payer: Self-pay | Admitting: Family Medicine

## 2018-03-04 DIAGNOSIS — K921 Melena: Secondary | ICD-10-CM

## 2018-03-04 DIAGNOSIS — D649 Anemia, unspecified: Secondary | ICD-10-CM

## 2018-03-12 ENCOUNTER — Other Ambulatory Visit: Payer: Medicare Other

## 2018-03-12 DIAGNOSIS — D649 Anemia, unspecified: Secondary | ICD-10-CM

## 2018-03-12 LAB — CBC WITH DIFFERENTIAL/PLATELET
BASOS PCT: 0.5 %
Basophils Absolute: 31 cells/uL (ref 0–200)
EOS ABS: 229 {cells}/uL (ref 15–500)
Eosinophils Relative: 3.7 %
HEMATOCRIT: 29.7 % — AB (ref 35.0–45.0)
HEMOGLOBIN: 9.4 g/dL — AB (ref 11.7–15.5)
LYMPHS ABS: 1358 {cells}/uL (ref 850–3900)
MCH: 29 pg (ref 27.0–33.0)
MCHC: 31.6 g/dL — ABNORMAL LOW (ref 32.0–36.0)
MCV: 91.7 fL (ref 80.0–100.0)
MONOS PCT: 7.2 %
MPV: 10.1 fL (ref 7.5–12.5)
NEUTROS ABS: 4135 {cells}/uL (ref 1500–7800)
Neutrophils Relative %: 66.7 %
Platelets: 280 10*3/uL (ref 140–400)
RBC: 3.24 10*6/uL — ABNORMAL LOW (ref 3.80–5.10)
RDW: 15.9 % — ABNORMAL HIGH (ref 11.0–15.0)
Total Lymphocyte: 21.9 %
WBC: 6.2 10*3/uL (ref 3.8–10.8)
WBCMIX: 446 {cells}/uL (ref 200–950)

## 2018-03-13 ENCOUNTER — Other Ambulatory Visit: Payer: Self-pay | Admitting: Family Medicine

## 2018-03-15 ENCOUNTER — Other Ambulatory Visit: Payer: Self-pay | Admitting: *Deleted

## 2018-03-15 DIAGNOSIS — D649 Anemia, unspecified: Secondary | ICD-10-CM

## 2018-03-16 ENCOUNTER — Ambulatory Visit: Payer: Medicare Other | Admitting: Internal Medicine

## 2018-03-25 ENCOUNTER — Other Ambulatory Visit: Payer: Medicare Other

## 2018-03-25 DIAGNOSIS — D649 Anemia, unspecified: Secondary | ICD-10-CM | POA: Diagnosis not present

## 2018-03-25 LAB — CBC WITH DIFFERENTIAL/PLATELET
BASOS ABS: 18 {cells}/uL (ref 0–200)
BASOS PCT: 0.3 %
Eosinophils Absolute: 232 cells/uL (ref 15–500)
Eosinophils Relative: 3.8 %
HEMATOCRIT: 29.3 % — AB (ref 35.0–45.0)
Hemoglobin: 9.3 g/dL — ABNORMAL LOW (ref 11.7–15.5)
LYMPHS ABS: 1415 {cells}/uL (ref 850–3900)
MCH: 29.1 pg (ref 27.0–33.0)
MCHC: 31.7 g/dL — AB (ref 32.0–36.0)
MCV: 91.6 fL (ref 80.0–100.0)
MPV: 10 fL (ref 7.5–12.5)
Monocytes Relative: 7.1 %
NEUTROS ABS: 4002 {cells}/uL (ref 1500–7800)
Neutrophils Relative %: 65.6 %
Platelets: 231 10*3/uL (ref 140–400)
RBC: 3.2 10*6/uL — ABNORMAL LOW (ref 3.80–5.10)
RDW: 15.7 % — ABNORMAL HIGH (ref 11.0–15.0)
Total Lymphocyte: 23.2 %
WBC: 6.1 10*3/uL (ref 3.8–10.8)
WBCMIX: 433 {cells}/uL (ref 200–950)

## 2018-04-08 ENCOUNTER — Other Ambulatory Visit: Payer: Self-pay | Admitting: Family Medicine

## 2018-04-16 ENCOUNTER — Encounter: Payer: Self-pay | Admitting: Family Medicine

## 2018-04-16 ENCOUNTER — Ambulatory Visit (INDEPENDENT_AMBULATORY_CARE_PROVIDER_SITE_OTHER): Payer: Medicare Other | Admitting: Family Medicine

## 2018-04-16 ENCOUNTER — Other Ambulatory Visit: Payer: Self-pay

## 2018-04-16 VITALS — BP 126/68 | HR 68 | Temp 97.7°F | Resp 14 | Ht 65.0 in | Wt 187.0 lb

## 2018-04-16 DIAGNOSIS — D649 Anemia, unspecified: Secondary | ICD-10-CM | POA: Insufficient documentation

## 2018-04-16 DIAGNOSIS — N183 Chronic kidney disease, stage 3 unspecified: Secondary | ICD-10-CM

## 2018-04-16 DIAGNOSIS — M150 Primary generalized (osteo)arthritis: Secondary | ICD-10-CM

## 2018-04-16 DIAGNOSIS — D631 Anemia in chronic kidney disease: Secondary | ICD-10-CM

## 2018-04-16 DIAGNOSIS — M8949 Other hypertrophic osteoarthropathy, multiple sites: Secondary | ICD-10-CM

## 2018-04-16 DIAGNOSIS — E119 Type 2 diabetes mellitus without complications: Secondary | ICD-10-CM

## 2018-04-16 DIAGNOSIS — D638 Anemia in other chronic diseases classified elsewhere: Secondary | ICD-10-CM | POA: Insufficient documentation

## 2018-04-16 DIAGNOSIS — M159 Polyosteoarthritis, unspecified: Secondary | ICD-10-CM

## 2018-04-16 DIAGNOSIS — M48061 Spinal stenosis, lumbar region without neurogenic claudication: Secondary | ICD-10-CM

## 2018-04-16 DIAGNOSIS — N189 Chronic kidney disease, unspecified: Secondary | ICD-10-CM

## 2018-04-16 MED ORDER — DICLOFENAC SODIUM 1 % TD GEL: 2.0000 g | Freq: Four times a day (QID) | TRANSDERMAL | 3 refills | Status: DC

## 2018-04-16 NOTE — Patient Instructions (Addendum)
F/U 3 months  Voltaren gel and tylenol for pain  We will call with lab results  Do not take the Aspirin until I call you

## 2018-04-16 NOTE — Progress Notes (Signed)
   Subjective:    Patient ID: [name], female    DOB: 1924/01/07, 84 y.o.   MRN: [id number]  Patient presents for Pain (had episode of rectla bleeding- Mobic/ ASA stopped- increased pain and aching since med stopped)  Pt here to discuss her arthritis medication. She has generalized OA. Was on meloxicam. She was evaluated in Oct for rectal bleeding, has internal hemorroids, Mobic and ASA were stopped due to acute bleeding. Her rectal bleeding resolved and she is now on stool softner and linzess but she wants to return to her arthritis meds as her joints hurt especially her shoulders.She does not want orthopedic intervention and knows risks of any surgery for her age.   Hb stabilized at low 9's, last  9.3    Review Of Systems:  GEN- denies fatigue, fever, weight loss,weakness, recent illness HEENT- denies eye drainage, change in vision, nasal discharge, CVS- denies chest pain, palpitations RESP- denies SOB, cough, wheeze ABD- denies N/V, change in stools, abd pain GU- denies dysuria, hematuria, dribbling, incontinence MSK- +joint pain, muscle aches, injury Neuro- denies headache, dizziness, syncope, seizure activity       Objective:    BP 126/68   Pulse 68   Temp 97.7 F (36.5 C) (Oral)   Resp 14   Ht 5\' 5"  (1.651 m)   Wt 187 lb (84.8 kg)   SpO2 98%   BMI 31.12 kg/m  GEN- NAD, alert and oriented x3 HEENT- PERRL, EOMI, non injected sclera, pink conjunctiva, MMM, oropharynx clear Neck- Supple, no thyromegaly CVS- RRR, no murmur RESP-CTAB ABD-NABS,soft,NT,ND EXT- chronic non pitting edema most at ankles  Pulses- Radial 2+, DP- 1+ MSK- Decreased ROM upper and lower ext, no effusion, walks with assistance       Assessment & Plan:      Problem List Items Addressed This Visit      Unprioritized   Anemia    No further bleeding and labs had stabilized Recheck today, if still stable, will restart her ASA 81mg  due to cardiac history She is a fairly well functioning  84 year old      CKD (chronic kidney disease), stage III (HCC)   Relevant Orders   Basic metabolic panel (Completed)   Diabetes mellitus, type II ([place]) - Primary    Diet controlled  On ACE and statin Has CKD as well, check renal function, another reason to avoid NSAIDS right now      Relevant Orders   CBC with Differential/Platelet (Completed)   Hemoglobin A1c (Completed)   OA (osteoarthritis)   Spinal stenosis of lumbar region    Generalized OA and spinal stenosis Will use tylenol voltaren gel topical         Note: This dictation was prepared with Dragon dictation along with smaller phrase technology. Any transcriptional errors that result from this process are unintentional.

## 2018-04-17 LAB — HEMOGLOBIN A1C
HEMOGLOBIN A1C: 5.8 %{Hb} — AB (ref <5.7)
MEAN PLASMA GLUCOSE: 120 (calc)
eAG (mmol/L): 6.6 (calc)

## 2018-04-17 LAB — BASIC METABOLIC PANEL
BUN / CREAT RATIO: 24 (calc) — AB (ref 6–22)
BUN: 42 mg/dL — ABNORMAL HIGH (ref 7–25)
CO2: 21 mmol/L (ref 20–32)
Calcium: 9.9 mg/dL (ref 8.6–10.4)
Chloride: 108 mmol/L (ref 98–110)
Creat: 1.75 mg/dL — ABNORMAL HIGH (ref 0.60–0.88)
GLUCOSE: 100 mg/dL — AB (ref 65–99)
Potassium: 4.7 mmol/L (ref 3.5–5.3)
Sodium: 140 mmol/L (ref 135–146)

## 2018-04-17 LAB — CBC WITH DIFFERENTIAL/PLATELET
BASOS PCT: 0.4 %
Basophils Absolute: 28 cells/uL (ref 0–200)
EOS PCT: 3.6 %
Eosinophils Absolute: 252 cells/uL (ref 15–500)
HCT: 30.2 % — ABNORMAL LOW (ref 35.0–45.0)
Hemoglobin: 9.6 g/dL — ABNORMAL LOW (ref 11.7–15.5)
Lymphs Abs: 1246 cells/uL (ref 850–3900)
MCH: 28.7 pg (ref 27.0–33.0)
MCHC: 31.8 g/dL — ABNORMAL LOW (ref 32.0–36.0)
MCV: 90.4 fL (ref 80.0–100.0)
MONOS PCT: 6 %
MPV: 10 fL (ref 7.5–12.5)
Neutro Abs: 5054 cells/uL (ref 1500–7800)
Neutrophils Relative %: 72.2 %
PLATELETS: 277 10*3/uL (ref 140–400)
RBC: 3.34 10*6/uL — AB (ref 3.80–5.10)
RDW: 15.6 % — ABNORMAL HIGH (ref 11.0–15.0)
TOTAL LYMPHOCYTE: 17.8 %
WBC mixed population: 420 cells/uL (ref 200–950)
WBC: 7 10*3/uL (ref 3.8–10.8)

## 2018-04-18 ENCOUNTER — Encounter: Payer: Self-pay | Admitting: Family Medicine

## 2018-04-18 DIAGNOSIS — M1990 Unspecified osteoarthritis, unspecified site: Secondary | ICD-10-CM | POA: Insufficient documentation

## 2018-04-18 NOTE — Assessment & Plan Note (Signed)
No further bleeding and labs had stabilized Recheck today, if still stable, will restart her ASA 81mg  due to cardiac history She is a fairly well functioning 84 year old

## 2018-04-18 NOTE — Assessment & Plan Note (Signed)
Generalized OA and spinal stenosis Will use tylenol voltaren gel topical

## 2018-04-18 NOTE — Assessment & Plan Note (Signed)
Diet controlled  On ACE and statin Has CKD as well, check renal function, another reason to avoid NSAIDS right now

## 2018-04-20 ENCOUNTER — Other Ambulatory Visit: Payer: Self-pay | Admitting: Family Medicine

## 2018-05-11 DIAGNOSIS — E119 Type 2 diabetes mellitus without complications: Secondary | ICD-10-CM | POA: Diagnosis not present

## 2018-05-11 DIAGNOSIS — H40011 Open angle with borderline findings, low risk, right eye: Secondary | ICD-10-CM | POA: Diagnosis not present

## 2018-05-11 DIAGNOSIS — Z961 Presence of intraocular lens: Secondary | ICD-10-CM | POA: Diagnosis not present

## 2018-05-11 DIAGNOSIS — H401123 Primary open-angle glaucoma, left eye, severe stage: Secondary | ICD-10-CM | POA: Diagnosis not present

## 2018-05-11 DIAGNOSIS — H35351 Cystoid macular degeneration, right eye: Secondary | ICD-10-CM | POA: Diagnosis not present

## 2018-05-13 DIAGNOSIS — H35351 Cystoid macular degeneration, right eye: Secondary | ICD-10-CM | POA: Diagnosis not present

## 2018-05-13 DIAGNOSIS — H353132 Nonexudative age-related macular degeneration, bilateral, intermediate dry stage: Secondary | ICD-10-CM | POA: Diagnosis not present

## 2018-05-13 DIAGNOSIS — H43813 Vitreous degeneration, bilateral: Secondary | ICD-10-CM | POA: Diagnosis not present

## 2018-05-13 DIAGNOSIS — H35371 Puckering of macula, right eye: Secondary | ICD-10-CM | POA: Diagnosis not present

## 2018-06-14 DIAGNOSIS — H35351 Cystoid macular degeneration, right eye: Secondary | ICD-10-CM | POA: Diagnosis not present

## 2018-06-14 DIAGNOSIS — H35371 Puckering of macula, right eye: Secondary | ICD-10-CM | POA: Diagnosis not present

## 2018-06-14 DIAGNOSIS — H353132 Nonexudative age-related macular degeneration, bilateral, intermediate dry stage: Secondary | ICD-10-CM | POA: Diagnosis not present

## 2018-06-14 DIAGNOSIS — H43822 Vitreomacular adhesion, left eye: Secondary | ICD-10-CM | POA: Diagnosis not present

## 2018-07-12 ENCOUNTER — Other Ambulatory Visit: Payer: Self-pay | Admitting: Family Medicine

## 2018-08-10 ENCOUNTER — Other Ambulatory Visit: Payer: Self-pay | Admitting: Family Medicine

## 2018-08-10 ENCOUNTER — Other Ambulatory Visit: Payer: Self-pay | Admitting: Physician Assistant

## 2018-08-11 IMAGING — DX DG CHEST 2V
2 series · 2 of 2 positions shown · non-contrast
Comparison: 01/12/2012

CLINICAL DATA: CHF, shortness of breath for 2 weeks, hypertension,
diabetes mellitus, GERD

EXAM:
CHEST  2 VIEW

[dg chest 2 view (1 of 2)]
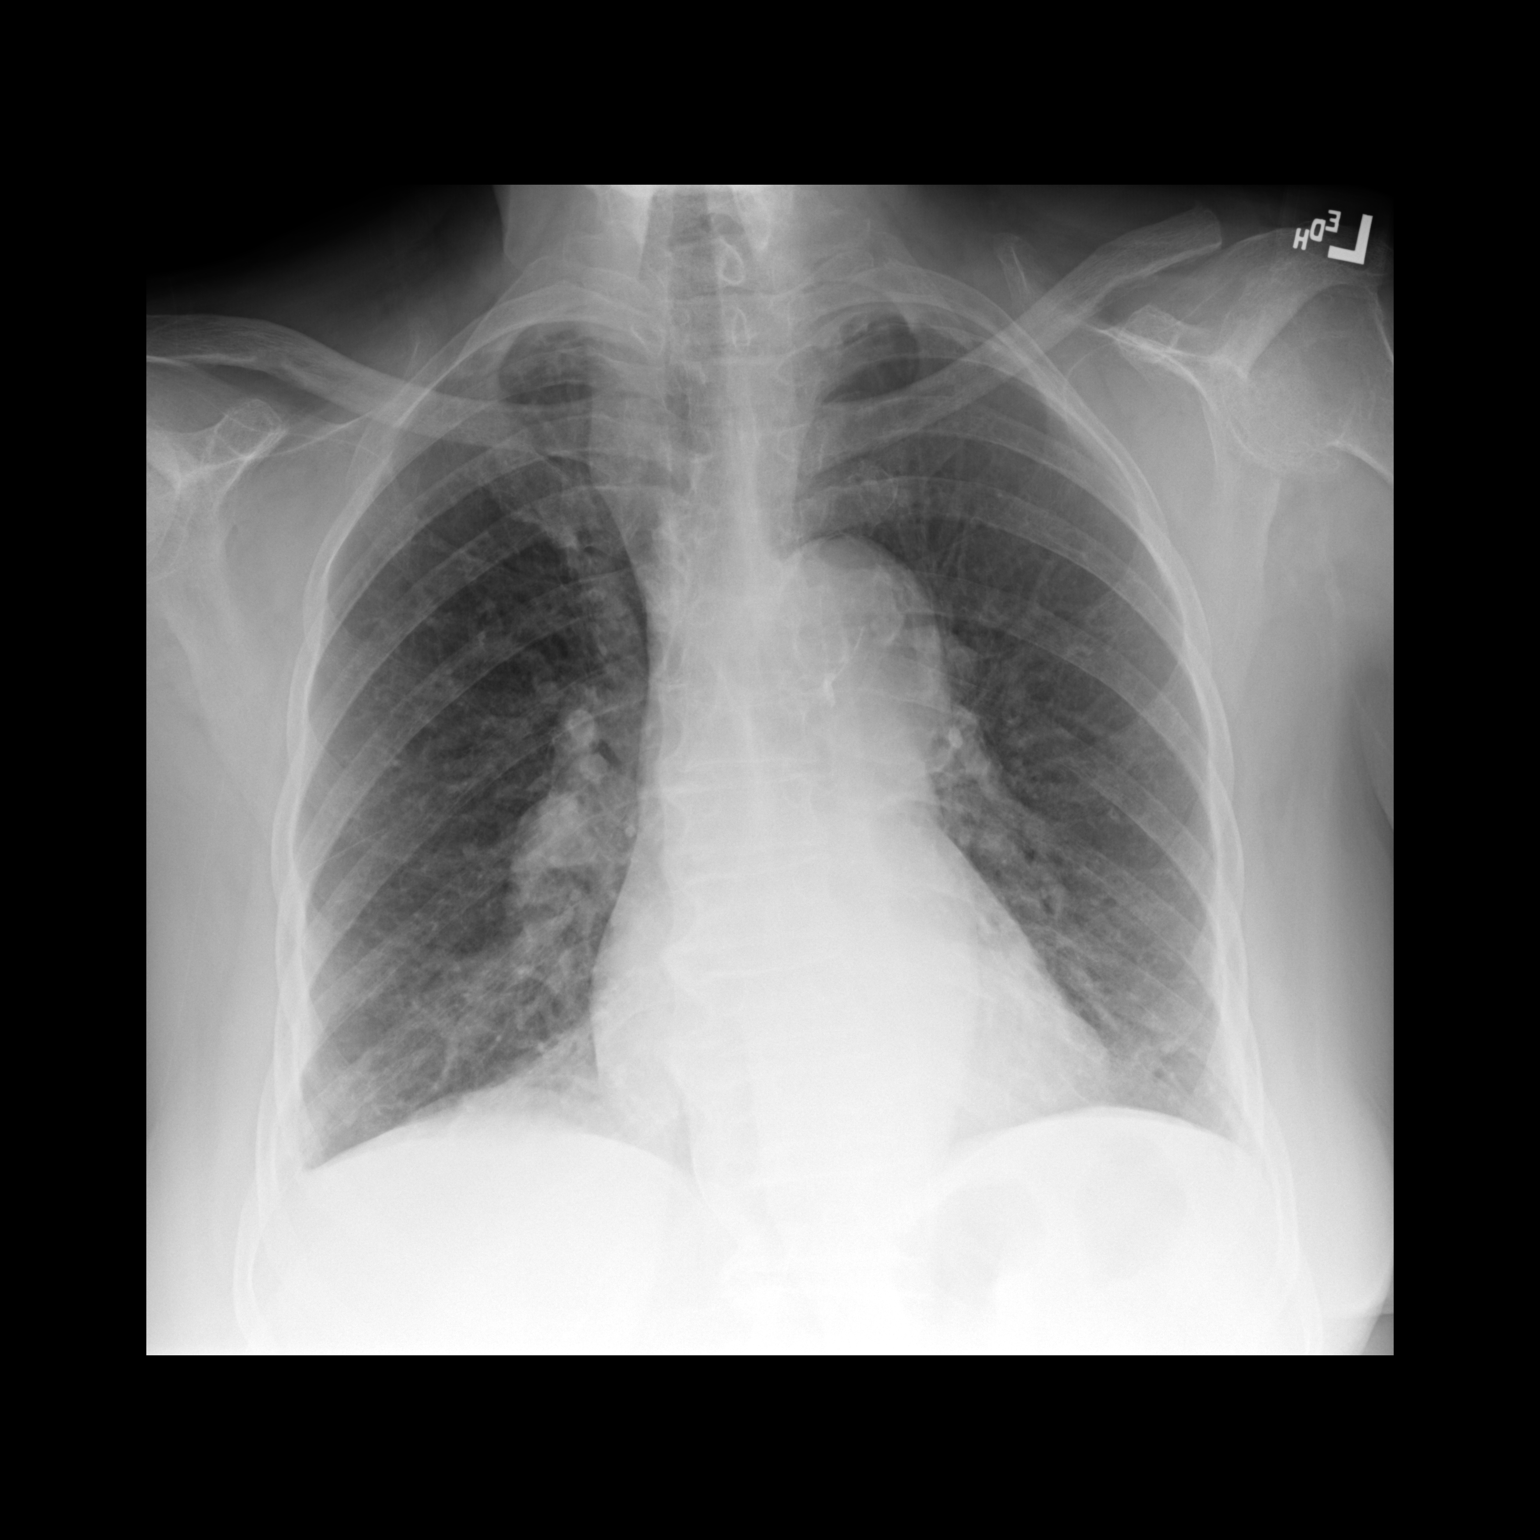

[dg chest 2 view (2 of 2)]
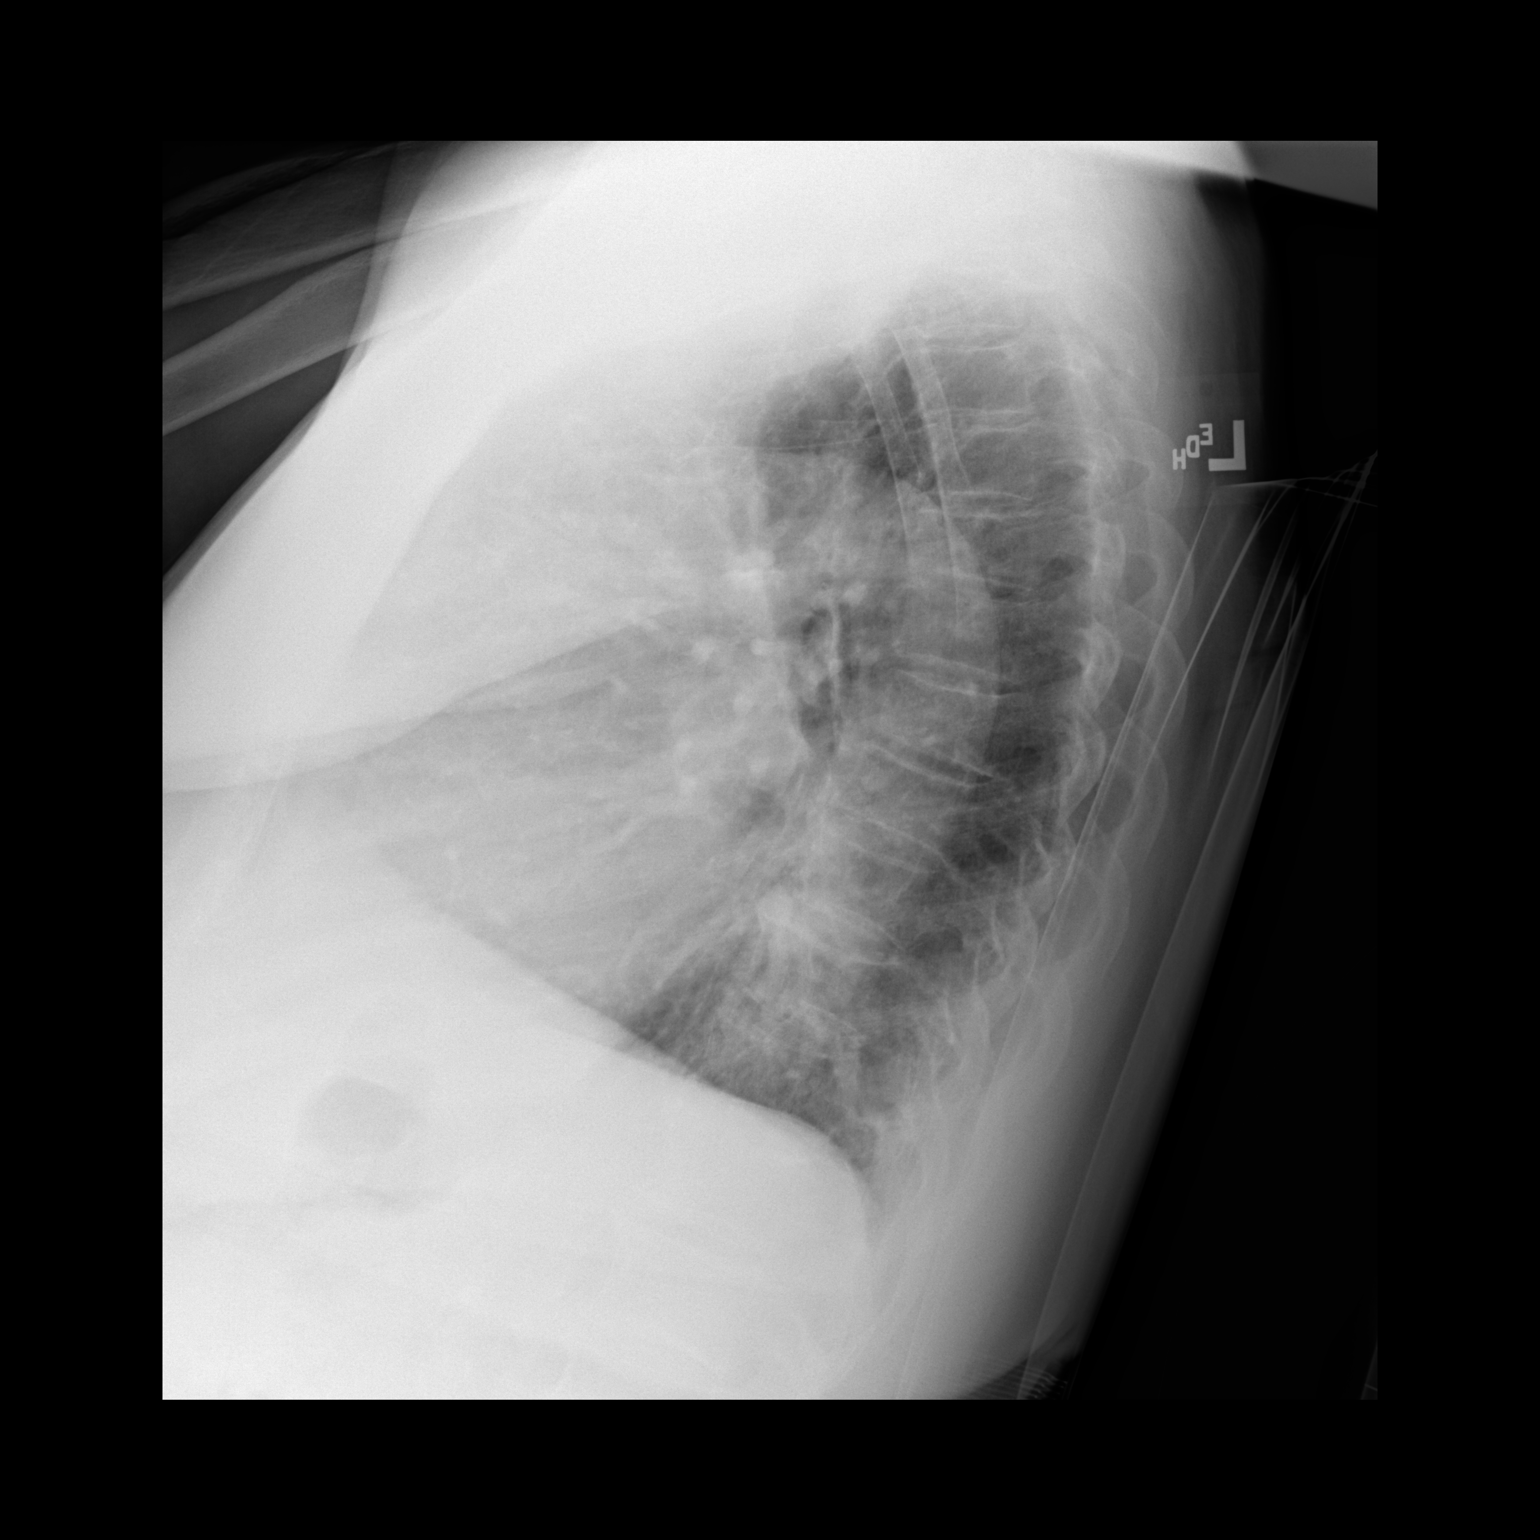

[2 of 2 positions shown; findings below may reference images not displayed]

FINDINGS: Minimal enlargement of cardiac silhouette.

Slight prominence of RIGHT paratracheal soft tissues unchanged.

Pulmonary vascularity normal.

Atherosclerotic calcifications aorta.

Mild biapical scarring.

No pulmonary infiltrate, pleural effusion or pneumothorax.

Bones unremarkable.
IMPRESSION: No acute abnormalities.

## 2018-08-16 IMAGING — DX DG ABDOMEN ACUTE W/ 1V CHEST
4 series · 4 of 4 positions shown · non-contrast
Comparison: 06/29/2017

CLINICAL DATA: Cough with nausea, vomiting, and diarrhea

EXAM:
DG ABDOMEN ACUTE W/ 1V CHEST

[chest pa]
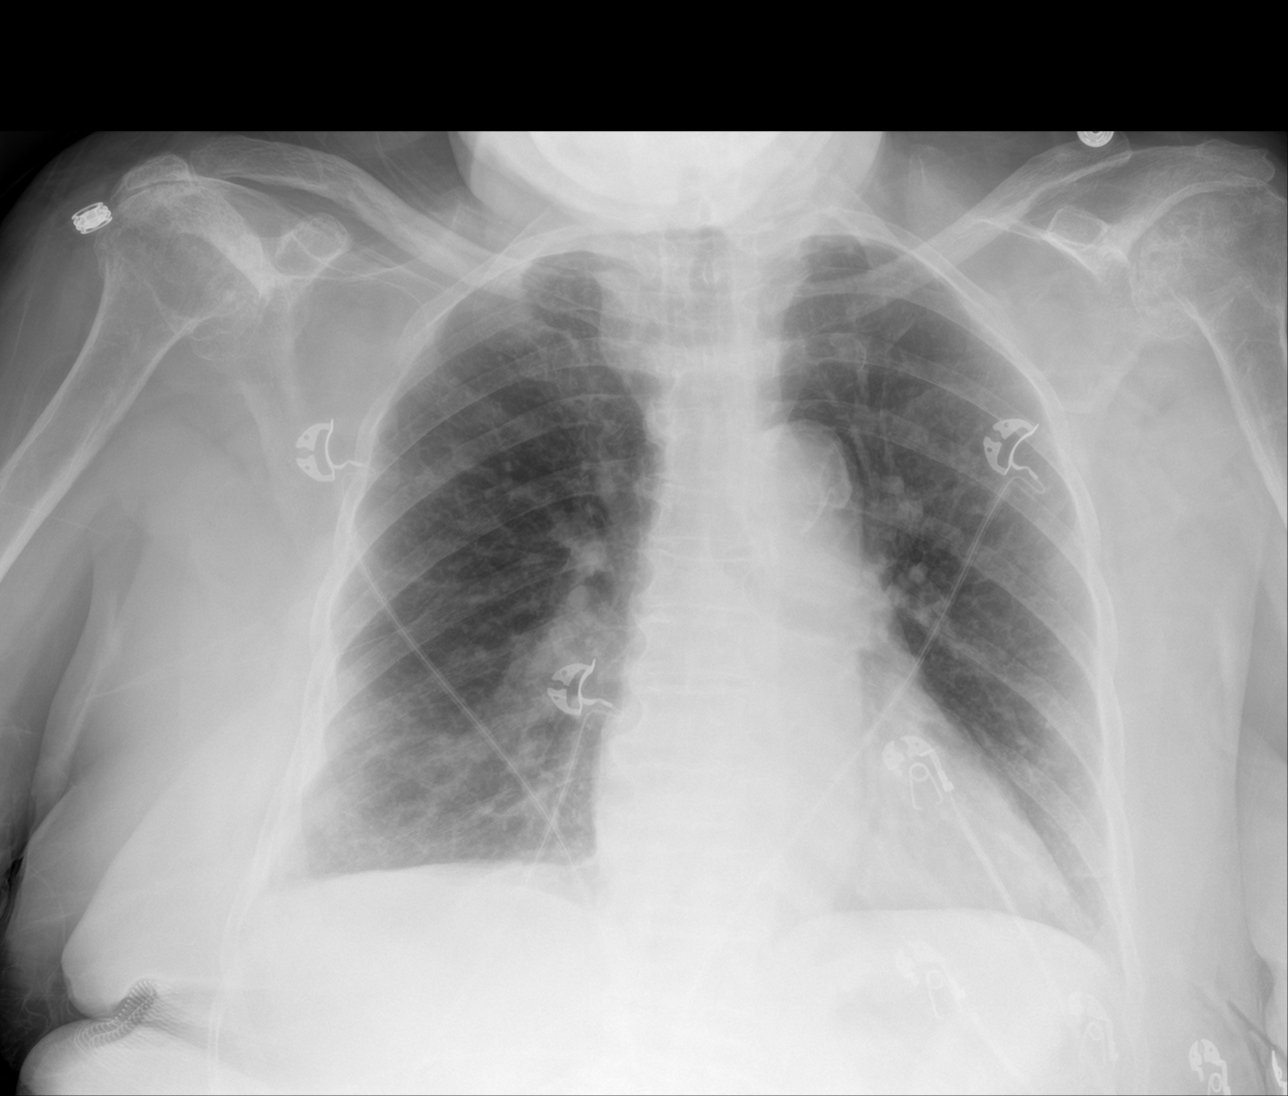

[abdomen erect]
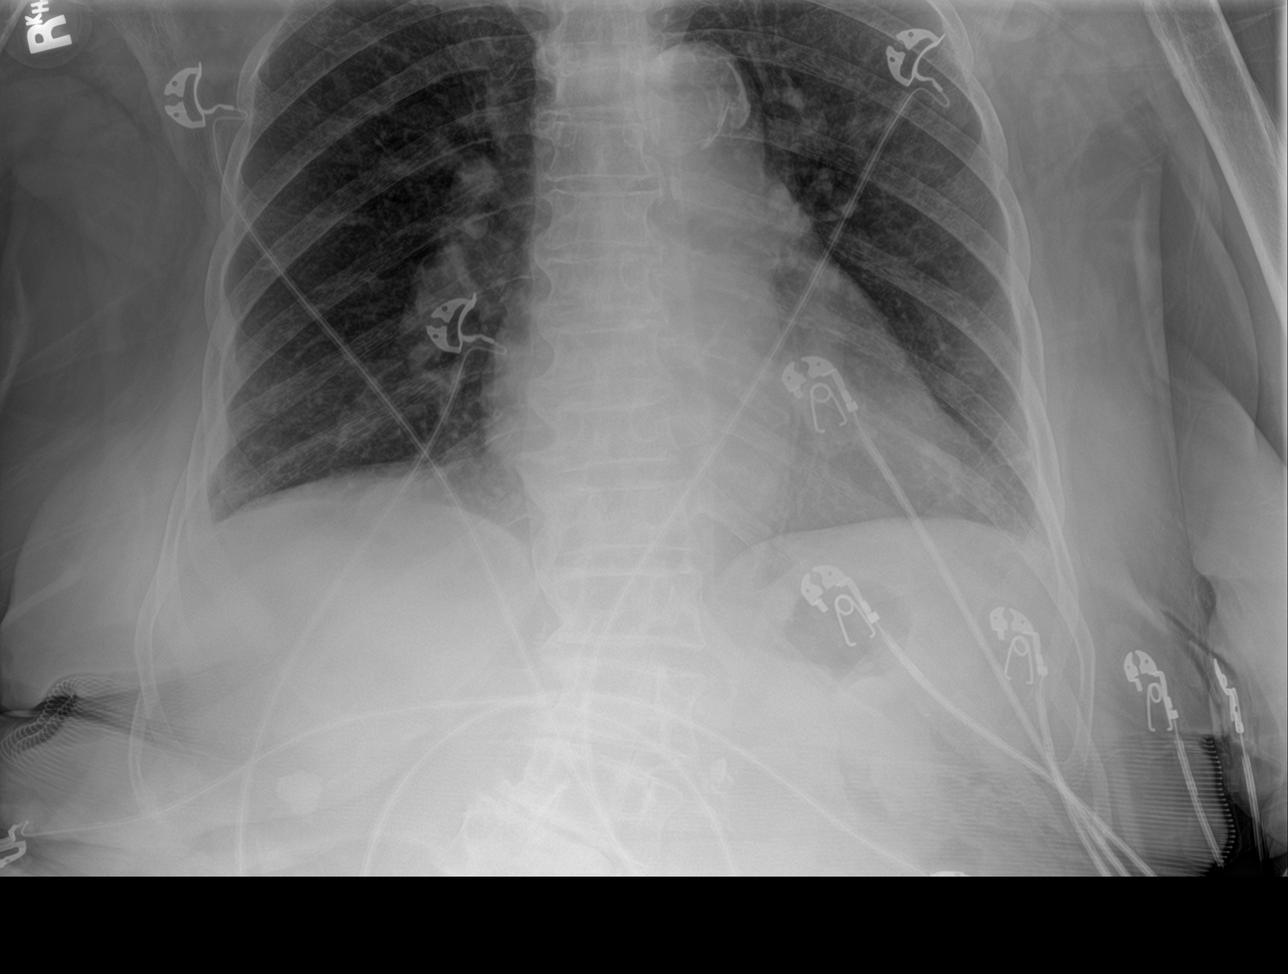

[abdomen supine (1 of 2)]
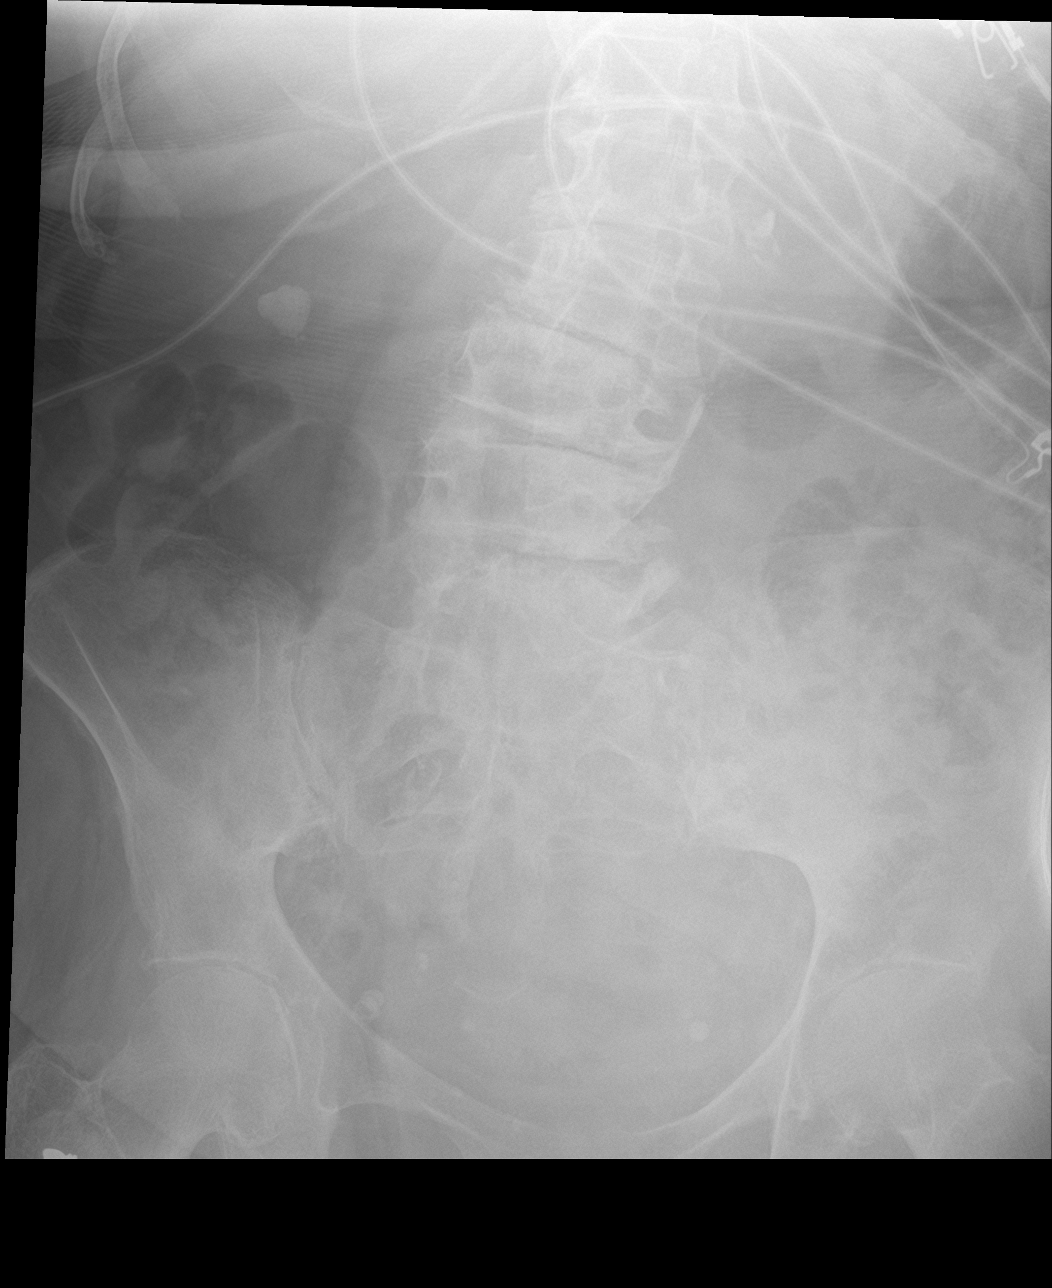

[abdomen supine (2 of 2)]
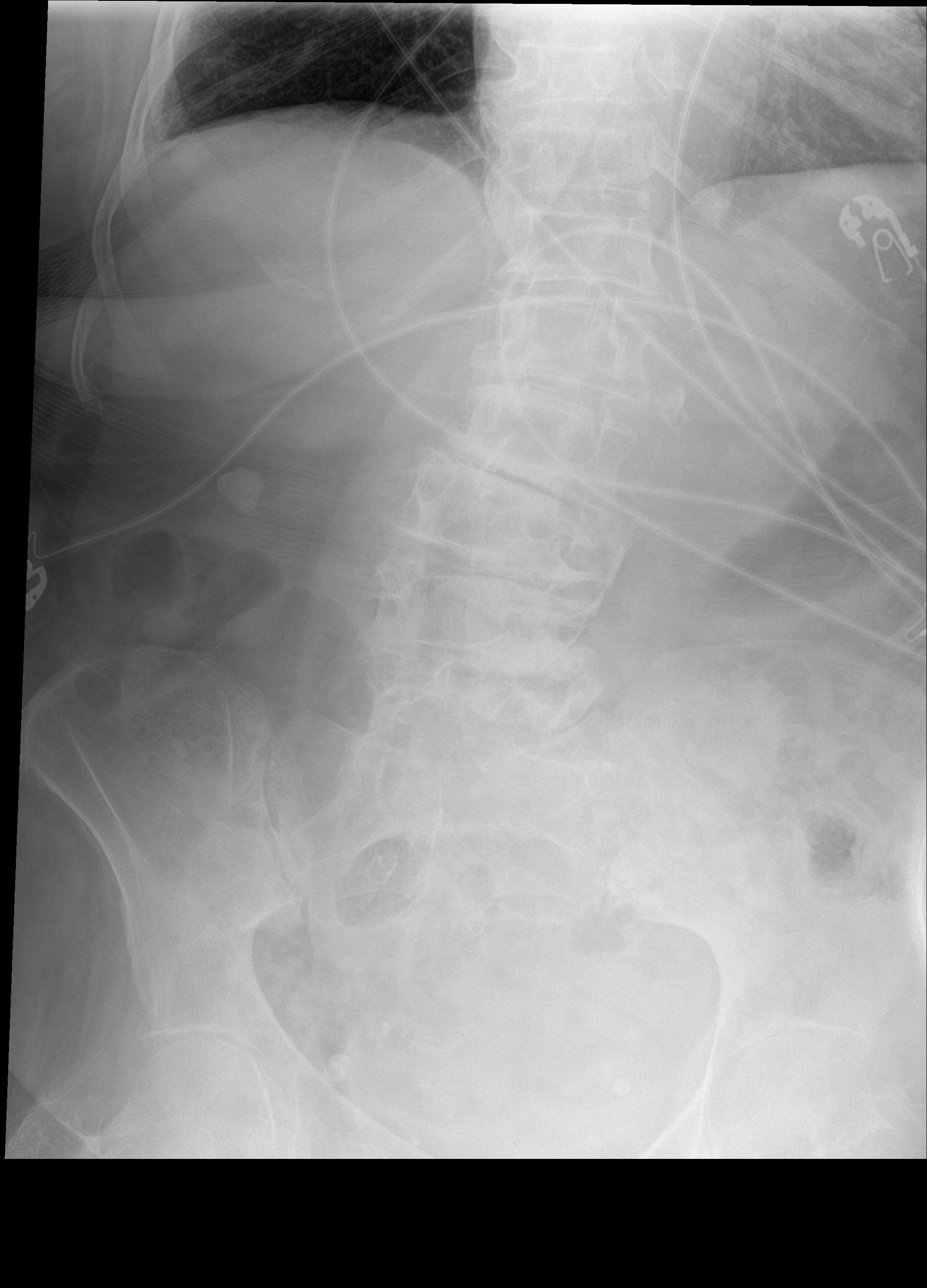

[4 of 4 positions shown; findings below may reference images not displayed]

FINDINGS: Large volume lungs. There is no edema, consolidation, effusion, or
pneumothorax. Stable borderline heart size. Atherosclerotic
calcification of the aorta with stable upper mediastinal widening
that is likely from ectatic vessels.

16 mm right renal calculus. Normal bowel gas pattern. No evidence of
pneumoperitoneum.

Advanced spinal degenerative disease with scoliosis.
IMPRESSION: 1. No acute finding in the chest or abdomen.
2. 16 mm right renal calculus.
3. Large lung volumes, possible COPD in this former smoker.

## 2018-08-17 ENCOUNTER — Telehealth: Payer: Self-pay | Admitting: Family Medicine

## 2018-08-17 MED ORDER — LISINOPRIL 40 MG PO TABS: 40.0000 mg | ORAL_TABLET | Freq: Every morning | ORAL | 3 refills | Status: DC

## 2018-08-17 MED ORDER — ALLOPURINOL 300 MG PO TABS: 300.0000 mg | ORAL_TABLET | Freq: Every day | ORAL | 1 refills | Status: DC

## 2018-08-17 MED ORDER — FUROSEMIDE 40 MG PO TABS: 20.0000 mg | ORAL_TABLET | Freq: Every day | ORAL | 1 refills | Status: DC

## 2018-08-17 NOTE — Telephone Encounter (Signed)
Prescriptions sent to pharmacy

## 2018-08-17 NOTE — Telephone Encounter (Signed)
Refill on lisinopril, allopurinol, and furosemide to [facility].

## 2018-08-24 ENCOUNTER — Other Ambulatory Visit: Payer: Self-pay

## 2018-08-24 ENCOUNTER — Encounter: Payer: Self-pay | Admitting: Family Medicine

## 2018-08-24 ENCOUNTER — Ambulatory Visit (INDEPENDENT_AMBULATORY_CARE_PROVIDER_SITE_OTHER): Payer: Medicare Other | Admitting: Family Medicine

## 2018-08-24 DIAGNOSIS — K219 Gastro-esophageal reflux disease without esophagitis: Secondary | ICD-10-CM

## 2018-08-24 DIAGNOSIS — I10 Essential (primary) hypertension: Secondary | ICD-10-CM | POA: Diagnosis not present

## 2018-08-24 DIAGNOSIS — R0989 Other specified symptoms and signs involving the circulatory and respiratory systems: Secondary | ICD-10-CM | POA: Diagnosis not present

## 2018-08-24 DIAGNOSIS — E119 Type 2 diabetes mellitus without complications: Secondary | ICD-10-CM | POA: Diagnosis not present

## 2018-08-24 MED ORDER — OMEPRAZOLE 40 MG PO CPDR: 40.0000 mg | DELAYED_RELEASE_CAPSULE | Freq: Every day | ORAL | 1 refills | Status: DC

## 2018-08-24 MED ORDER — ALLOPURINOL 300 MG PO TABS: 300.0000 mg | ORAL_TABLET | Freq: Every day | ORAL | 1 refills | Status: DC

## 2018-08-24 MED ORDER — POTASSIUM CHLORIDE CRYS ER 20 MEQ PO TBCR: EXTENDED_RELEASE_TABLET | ORAL | 1 refills | Status: DC

## 2018-08-24 MED ORDER — ALBUTEROL SULFATE HFA 108 (90 BASE) MCG/ACT IN AERS: 2.0000 | INHALATION_SPRAY | RESPIRATORY_TRACT | 1 refills | Status: DC | PRN

## 2018-08-24 MED ORDER — LISINOPRIL 40 MG PO TABS: 40.0000 mg | ORAL_TABLET | Freq: Every morning | ORAL | 3 refills | Status: DC

## 2018-08-24 NOTE — Progress Notes (Signed)
Virtual Visit via Telephone Note  I connected with [name] on 08/24/18 at 10:00am  by telephone and verified that I am speaking with the correct person using two identifiers.  Pt location: At home Physician Location: Vic Blackbird MD, at home   On phone- patient, daughter - Shelley Cocke, physician  I discussed the limitations, risks, security and privacy concerns of performing an evaluation and management service by telephone and the availability of in person appointments. I also discussed with the patient that there may be a patient responsible charge related to this service. The patient expressed understanding and agreed to proceed.   History of Present Illness: DM- last A1C  5.8% in November ,currently diet controlled   Hyperlipidemia- no difficulty with statin drug   Mild Cough with congestion for months , no fever, Temp 11F, no SOB, no chest pain. Taking tylenol. No swelling in legs  Occasional hears some wheezing but nothing recently , once congestion comes up , she feels better.typically at night when she lays down or first thing in the morning Her ears due itch for a while- this has been going on for many months , no drainage.  No vomiting or diarrhea No sickness within the home   Has not checked blood pressure  Sugar has been normal   No falls    Has GERD- feels it when she lays down, was on nexium, protonix, zantac in the past, nothing currently    Observations/Objective:   Assessment and Plan: Start omeprazole 40mg  once a day, can not afford nexium  for congestion may be a mix of untreated GERD and has congestion but overt signs of illness via phone coversation, no chest pain, no fluid overload noted at home  can use coridian and will have albuterol on hand  Had to use last spring   HTN- pt to check BP, no changes to  medication  DM- diet controlled  Medications reviewed, will send in refills Diastolic HF- Takes lasix and potassium only prn   Follow  Up Instructions:    I discussed the assessment and treatment plan with the patient. The patient was provided an opportunity to ask questions and all were answered. The patient agreed with the plan and demonstrated an understanding of the instructions.   The patient was advised to call back or seek an in-person evaluation if the symptoms worsen or if the condition fails to improve as anticipated.  I provided  25  minutes of non-face-to-face time during this encounter including chart review.  Call end time: 10:21  Vic Blackbird, MD

## 2018-08-26 ENCOUNTER — Other Ambulatory Visit: Payer: Self-pay | Admitting: *Deleted

## 2018-08-26 MED ORDER — ALBUTEROL SULFATE HFA 108 (90 BASE) MCG/ACT IN AERS: 2.0000 | INHALATION_SPRAY | Freq: Four times a day (QID) | RESPIRATORY_TRACT | 3 refills | Status: DC | PRN

## 2018-08-31 ENCOUNTER — Other Ambulatory Visit: Payer: Self-pay | Admitting: *Deleted

## 2018-08-31 MED ORDER — POTASSIUM CHLORIDE CRYS ER 20 MEQ PO TBCR: EXTENDED_RELEASE_TABLET | ORAL | 1 refills | Status: DC

## 2018-09-14 ENCOUNTER — Telehealth: Payer: Self-pay | Admitting: Family Medicine

## 2018-09-14 NOTE — Telephone Encounter (Signed)
Patient calling to ask questions about lisinopril  Please call [phone]

## 2018-09-14 NOTE — Telephone Encounter (Signed)
Call placed to patient and patient made aware.   [name] states that she is not comfortable taking medication and does not want to take it.   MD please advise.

## 2018-09-14 NOTE — Telephone Encounter (Signed)
Call placed to patient daughter Rodney Cruise. [phone]~ telephone.   Reports that she had reaction to Lisinopril (angioedema). States that she is concerned about her mother taking medication.   Reports that patient has no issues with Lisinopril. BP normally ranges around 120/70.  MD please advise.

## 2018-09-14 NOTE — Telephone Encounter (Signed)
Switch to losartan 25mg  once a day

## 2018-09-14 NOTE — Telephone Encounter (Signed)
[name] has been fine on the medication, it helps with her BP and her heart  I would not stop at this time.  Unfortunately we never know who may react to the lisinopril, but since she has not had problems, no changes   Dr. Buelah Manis

## 2018-09-15 ENCOUNTER — Other Ambulatory Visit: Payer: Self-pay | Admitting: Family Medicine

## 2018-09-15 MED ORDER — LOSARTAN POTASSIUM 25 MG PO TABS: 25.0000 mg | ORAL_TABLET | Freq: Every day | ORAL | 1 refills | Status: DC

## 2018-09-15 NOTE — Telephone Encounter (Signed)
Call placed to patient and patient made aware.   Agreeable to plan.   Prescription sent to pharmacy.

## 2018-10-07 ENCOUNTER — Telehealth: Payer: Self-pay | Admitting: *Deleted

## 2018-10-07 NOTE — Telephone Encounter (Signed)
Received call from patient daughter [name].   Reports that patient has increased weakness. States that patient is able to ambulate to bathroom, but she is very fatigued after exertion.   Denies fever, SOB, chest pain, edema. States that she is using walker to assit with mobility.   MD please advise.

## 2018-10-08 NOTE — Telephone Encounter (Signed)
Call pt back and see how she is doing, does she have any new symptoms?  Is she able to eat/drink normally, any UTI symptoms If not eating and drinking well, still very weak, needs to take her to the ER due to all of her medical problems

## 2018-10-08 NOTE — Telephone Encounter (Signed)
Patient daughter [name] aware.

## 2018-10-08 NOTE — Telephone Encounter (Signed)
Call placed to patient. Patient daughter [name] had just left home to go out.   Patient states that she is feeling much better today. Reports that she has good appetite and is drinking plenty of fluids. States that she has no issues with her urine or bowels.   Will call back to follow up with [name].

## 2018-10-08 NOTE — Telephone Encounter (Signed)
Okay, keep an eye on her, if anything changes call us If chest pain, SOB, will not eat or drink take to ER

## 2018-10-08 NOTE — Telephone Encounter (Signed)
Patient daughter [name] contacted office. States that patient is improved. Reports no issues with bowels or bladder. No cough, SOB noted. States that she is still slightly weak, but much better than she was yesterday.

## 2018-10-11 ENCOUNTER — Other Ambulatory Visit: Payer: Self-pay | Admitting: Family Medicine

## 2018-11-16 ENCOUNTER — Ambulatory Visit (INDEPENDENT_AMBULATORY_CARE_PROVIDER_SITE_OTHER): Payer: Medicare Other | Admitting: Family Medicine

## 2018-11-16 ENCOUNTER — Other Ambulatory Visit: Payer: Self-pay

## 2018-11-16 ENCOUNTER — Other Ambulatory Visit: Payer: Self-pay | Admitting: Family Medicine

## 2018-11-16 ENCOUNTER — Encounter: Payer: Self-pay | Admitting: Family Medicine

## 2018-11-16 VITALS — BP 130/62 | HR 66 | Temp 97.9°F | Resp 18 | Ht 65.0 in | Wt 181.0 lb

## 2018-11-16 DIAGNOSIS — D631 Anemia in chronic kidney disease: Secondary | ICD-10-CM

## 2018-11-16 DIAGNOSIS — I5032 Chronic diastolic (congestive) heart failure: Secondary | ICD-10-CM | POA: Diagnosis not present

## 2018-11-16 DIAGNOSIS — N183 Chronic kidney disease, stage 3 unspecified: Secondary | ICD-10-CM

## 2018-11-16 DIAGNOSIS — N189 Chronic kidney disease, unspecified: Secondary | ICD-10-CM

## 2018-11-16 DIAGNOSIS — E119 Type 2 diabetes mellitus without complications: Secondary | ICD-10-CM | POA: Diagnosis not present

## 2018-11-16 DIAGNOSIS — R0602 Shortness of breath: Secondary | ICD-10-CM | POA: Diagnosis not present

## 2018-11-16 DIAGNOSIS — M79671 Pain in right foot: Secondary | ICD-10-CM

## 2018-11-16 MED ORDER — FUROSEMIDE 40 MG PO TABS: 40.0000 mg | ORAL_TABLET | Freq: Every day | ORAL | 1 refills | Status: DC

## 2018-11-16 NOTE — Telephone Encounter (Signed)
Requested Prescriptions   Pending Prescriptions Disp Refills  . amLODipine (NORVASC) 5 MG tablet [Pharmacy Med Name: AMLODIPINE  5MG   TAB] 90 tablet 1    Sig: TAKE 1 TABLET BY MOUTH  DAILY   Refused Prescriptions Disp Refills  . hydrALAZINE (APRESOLINE) 10 MG tablet [Pharmacy Med Name: HYDRALAZINE  10MG   TAB] 180 tablet 1    Sig: TAKE 1 TABLET BY MOUTH TWO  TIMES DAILY  . meloxicam (MOBIC) 15 MG tablet [Pharmacy Med Name: MELOXICAM  15MG   TAB] 90 tablet 1    Sig: TAKE 1 TABLET BY MOUTH  DAILY   Last OV 08/24/2018

## 2018-11-16 NOTE — Patient Instructions (Addendum)
Increase lasix to 1 tablet daily for 1 week, take with potassium  We will call with lab results  Increase her fluids  F/U pending results

## 2018-11-16 NOTE — Assessment & Plan Note (Addendum)
Daytime fatigue is common at her age however her increased shortness of breath with minimal exertion is concerning that her heart failure is worsened.  I would increase her Lasix to 40 mg once a day along with the potassium.  I am checking labs today and a BNP.  She is not having any chest pain and there is no significant changes on her EKG compared to her ones from 2019 with her cardiologist.  We will hold on sending her to the hospital for chest x-ray at this time she has some mild crackles in the left  base but normal oxygen saturation.  Pending her labs we will have her follow back up in the office for recheck on fluid status.  For her peripheral edema she is also to wear her compression hose.  I will check her blood sugar renal function as well during this time.  Discussed with her daughter trying to get her to eat more protein to actually drink more fluids she does not like to drink very much often just wants soda [facility] and Pepsi.

## 2018-11-16 NOTE — Progress Notes (Signed)
Subjective:    Patient ID: [name], female    DOB: [birth year], 85 y.o.   MRN: [id number]  Patient presents for R Heel Pain (states that it will hurt and keep her up at night- no injury or wound reported) and SOB on Exertion (states that she gets winded easy when she moves around )  Patient here with her daughter.  For the past few weeks she has had increased shortness of breath and feeling more fatigued.  She often falls asleep for hours during the day some nights she still sleeps at nighttime.  She is not had any chest pain no fever no cough congestion.  She has been taking her Lasix 20 mg once a day.  She gets winded very easily with minimal exertion.  She is only sleeping on one pillow at night.  She does not require oxygen at this time.  Her weight is down 6 pounds since last November she admits that she has not been eating as much throughout the months.  She denies any abdominal pain change in her bowels for urinary pattern.  Her other concern was some pain in her right heel that would keep her up at night she not have any injury to the foot.  She used peroxide and rubbed it on there and now the pain has resolved   No COVID-19 symptoms  Review Of Systems:  GEN- denies fatigue, fever, weight loss,weakness, recent illness HEENT- denies eye drainage, change in vision, nasal discharge, CVS- denies chest pain, palpitations RESP- + SOB, cough, wheeze ABD- denies N/V, change in stools, abd pain GU- denies dysuria, hematuria, dribbling, incontinence MSK- + joint pain, muscle aches, injury Neuro- denies headache, dizziness, syncope, seizure activity       Objective:    BP 130/62   Pulse 66   Temp 97.9 F (36.6 C) (Oral)   Resp 18   Ht 5\' 5"  (1.651 m)   Wt 181 lb (82.1 kg)   SpO2 96%   BMI 30.12 kg/m  GEN- NAD, alert and oriented x3 HEENT- PERRL, EOMI, non injected sclera, pink conjunctiva, MMM, oropharynx clear Neck- Supple, no thyromegaly, no JVD CVS- RRR, no  murmur RESP-mild crackles left base ABD-NABS,soft,NT,ND EXT- chronic non pitting edema to mid shins  R >L Pulses- Radial 2+, DP- 1+, Heel NT MSK- Decreased ROM upper and lower ext, no effusion, walks with assistance  EKG- NSR, no ST changes, flat t waves lateral leads    Assessment & Plan:      Problem List Items Addressed This Visit      Unprioritized   Anemia   Relevant Orders   CBC with Differential/Platelet   Chronic diastolic CHF (congestive heart failure) (HCC) - Primary    Daytime fatigue is common at her age however her increased shortness of breath with minimal exertion is concerning that her heart failure is worsened.  I would increase her Lasix to 40 mg once a day along with the potassium.  I am checking labs today and a BNP.  She is not having any chest pain and there is no significant changes on her EKG compared to her ones from 2019 with her cardiologist.  We will hold on sending her to the hospital for chest x-ray at this time she has some mild crackles in the left  base but normal oxygen saturation.  Pending her labs we will have her follow back up in the office for recheck on fluid status.  For her peripheral edema she  is also to wear her compression hose.  I will check her blood sugar renal function as well during this time.  Discussed with her daughter trying to get her to eat more protein to actually drink more fluids she does not like to drink very much often just wants soda [employer] [employer] [employer] and [employer].      Relevant Medications   furosemide (LASIX) 40 MG tablet   Other Relevant Orders   CBC with Differential/Platelet   Comprehensive metabolic panel   Lipid panel   Brain natriuretic peptide   EKG 12-Lead (Completed)   CKD (chronic kidney disease), stage III (HCC)   Diabetes mellitus, type II (HCC)   Relevant Orders   Hemoglobin A1c   Lipid panel    Other Visit Diagnoses    SOB (shortness of breath)       Relevant Orders   Brain natriuretic peptide   EKG 12-Lead  (Completed)   Pain of right heel       DD, OA/Bone spur, at this time use topical anti-inflammatory, no current pain   Relevant Orders   Uric Acid      Note: This dictation was prepared with Dragon dictation along with smaller phrase technology. Any transcriptional errors that result from this process are unintentional.

## 2018-11-17 DIAGNOSIS — I5032 Chronic diastolic (congestive) heart failure: Secondary | ICD-10-CM | POA: Diagnosis not present

## 2018-11-17 DIAGNOSIS — E119 Type 2 diabetes mellitus without complications: Secondary | ICD-10-CM | POA: Diagnosis not present

## 2018-11-17 DIAGNOSIS — N189 Chronic kidney disease, unspecified: Secondary | ICD-10-CM | POA: Diagnosis not present

## 2018-11-17 DIAGNOSIS — R0602 Shortness of breath: Secondary | ICD-10-CM | POA: Diagnosis not present

## 2018-11-17 DIAGNOSIS — D631 Anemia in chronic kidney disease: Secondary | ICD-10-CM | POA: Diagnosis not present

## 2018-11-18 LAB — COMPREHENSIVE METABOLIC PANEL
AG Ratio: 1.6 (calc) (ref 1.0–2.5)
ALT: 6 U/L (ref 6–29)
AST: 13 U/L (ref 10–35)
Albumin: 4.2 g/dL (ref 3.6–5.1)
Alkaline phosphatase (APISO): 158 U/L — ABNORMAL HIGH (ref 37–153)
BUN/Creatinine Ratio: 21 (calc) (ref 6–22)
BUN: 33 mg/dL — ABNORMAL HIGH (ref 7–25)
CO2: 22 mmol/L (ref 20–32)
Calcium: 9.6 mg/dL (ref 8.6–10.4)
Chloride: 105 mmol/L (ref 98–110)
Creat: 1.55 mg/dL — ABNORMAL HIGH (ref 0.60–0.88)
Globulin: 2.6 g/dL (calc) (ref 1.9–3.7)
Glucose, Bld: 112 mg/dL — ABNORMAL HIGH (ref 65–99)
Potassium: 4.8 mmol/L (ref 3.5–5.3)
Sodium: 141 mmol/L (ref 135–146)
Total Bilirubin: 0.3 mg/dL (ref 0.2–1.2)
Total Protein: 6.8 g/dL (ref 6.1–8.1)

## 2018-11-18 LAB — CBC WITH DIFFERENTIAL/PLATELET
Absolute Monocytes: 418 cells/uL (ref 200–950)
Basophils Absolute: 22 cells/uL (ref 0–200)
Basophils Relative: 0.3 %
Eosinophils Absolute: 209 cells/uL (ref 15–500)
Eosinophils Relative: 2.9 %
HCT: 30.3 % — ABNORMAL LOW (ref 35.0–45.0)
Hemoglobin: 9.7 g/dL — ABNORMAL LOW (ref 11.7–15.5)
Lymphs Abs: 1282 cells/uL (ref 850–3900)
MCH: 29.3 pg (ref 27.0–33.0)
MCHC: 32 g/dL (ref 32.0–36.0)
MCV: 91.5 fL (ref 80.0–100.0)
MPV: 10.1 fL (ref 7.5–12.5)
Monocytes Relative: 5.8 %
Neutro Abs: 5270 cells/uL (ref 1500–7800)
Neutrophils Relative %: 73.2 %
Platelets: 262 10*3/uL (ref 140–400)
RBC: 3.31 10*6/uL — ABNORMAL LOW (ref 3.80–5.10)
RDW: 15.8 % — ABNORMAL HIGH (ref 11.0–15.0)
Total Lymphocyte: 17.8 %
WBC: 7.2 10*3/uL (ref 3.8–10.8)

## 2018-11-18 LAB — HEMOGLOBIN A1C
Hgb A1c MFr Bld: 6.2 % of total Hgb — ABNORMAL HIGH (ref <5.7)
Mean Plasma Glucose: 131 (calc)
eAG (mmol/L): 7.3 (calc)

## 2018-11-18 LAB — LIPID PANEL
Cholesterol: 162 mg/dL (ref <200)
HDL: 45 mg/dL — ABNORMAL LOW (ref >=50)
LDL Cholesterol (Calc): 91 mg/dL (calc)
Non-HDL Cholesterol (Calc): 117 mg/dL (calc) (ref <130)
Total CHOL/HDL Ratio: 3.6 (calc) (ref <5.0)
Triglycerides: 165 mg/dL — ABNORMAL HIGH (ref <150)

## 2018-11-18 LAB — BRAIN NATRIURETIC PEPTIDE: Brain Natriuretic Peptide: 196 pg/mL — ABNORMAL HIGH (ref <100)

## 2018-11-18 LAB — URIC ACID: Uric Acid, Serum: 2.4 mg/dL — ABNORMAL LOW (ref 2.5–7.0)

## 2018-11-24 ENCOUNTER — Other Ambulatory Visit: Payer: Self-pay

## 2018-11-24 ENCOUNTER — Encounter: Payer: Self-pay | Admitting: Family Medicine

## 2018-11-24 ENCOUNTER — Ambulatory Visit (INDEPENDENT_AMBULATORY_CARE_PROVIDER_SITE_OTHER): Payer: Medicare Other | Admitting: Family Medicine

## 2018-11-24 VITALS — BP 132/66 | HR 70 | Temp 98.2°F | Resp 14 | Ht 65.0 in | Wt 184.0 lb

## 2018-11-24 DIAGNOSIS — N183 Chronic kidney disease, stage 3 unspecified: Secondary | ICD-10-CM

## 2018-11-24 DIAGNOSIS — I5032 Chronic diastolic (congestive) heart failure: Secondary | ICD-10-CM

## 2018-11-24 DIAGNOSIS — D631 Anemia in chronic kidney disease: Secondary | ICD-10-CM | POA: Diagnosis not present

## 2018-11-24 DIAGNOSIS — N189 Chronic kidney disease, unspecified: Secondary | ICD-10-CM

## 2018-11-24 NOTE — Progress Notes (Signed)
   Subjective:    Patient ID: [name], female    DOB: 11-11-1923, 85 y.o.   MRN: [id number]  Patient presents for Follow-up (is fasting) Is here for interim follow-up on her congestive heart failure and fatigue.  She was seen last week.  I increase her Lasix to 40 mg once a day secondary to shortness of breath which I felt was due to her heart failure.  Her labs back with elevated BNP at 196.  She does have chronic anemia which was overall stable.  Her renal function also looked okay she has chronic kidney disease her creatinine was actually improved it was at 1.55.  She is due for repeat metabolic panel today as I did increase her Lasix.  With regards to some of the daytime sleeping she is almost 85 years old likely multifactorial and she was having more dyspnea getting around because of the extra fluid.  Today Feels good, states breathing is better    diabetes looks good A1c was 6.2% diet-controlled, LDL also at goal at 91  Her weight is up 3lbs from last week   Her cardiologist is Dr. Daneen Schick  Review Of Systems:  GEN- denies fatigue, fever, weight loss,weakness, recent illness HEENT- denies eye drainage, change in vision, nasal discharge, CVS- denies chest pain, palpitations RESP- denies SOB, cough, wheeze ABD- denies N/V, change in stools, abd pain GU- denies dysuria, hematuria, dribbling, incontinence MSK- denies joint pain, muscle aches, injury Neuro- denies headache, dizziness, syncope, seizure activity       Objective:    BP 132/66   Pulse 70   Temp 98.2 F (36.8 C) (Oral)   Resp 14   Ht 5\' 5"  (1.651 m)   Wt 184 lb (83.5 kg)   SpO2 98%   BMI 30.62 kg/m  GEN- NAD, alert and oriented x3 HEENT- PERRL, EOMI, non injected sclera, pink conjunctiva, MMM, oropharynx clear Neck- Supple, no thyromegaly, no JVD CVS- RRR, no murmur RESP-CTAB ABD-NABS,soft,NT,ND EXT- chronic non pitting edema to mid shins  R >L - decreased  Pulses- Radial 2+, DP- 1+,  walks with  assistance       Assessment & Plan:      Problem List Items Addressed This Visit      Unprioritized   Anemia    Chronic anemia of chronic diease MTF, baseline 9-10      Chronic diastolic CHF (congestive heart failure) (HCC) - Primary    Improved breathing, will get appt with her cardiologist Continue lasix 40mg  Recheck renal function BP looks good      Relevant Orders   Basic metabolic panel   CKD (chronic kidney disease), stage III (HCC)   Relevant Orders   Basic metabolic panel      Note: This dictation was prepared with Dragon dictation along with smaller phrase technology. Any transcriptional errors that result from this process are unintentional.

## 2018-11-24 NOTE — Assessment & Plan Note (Signed)
Improved breathing, will get appt with her cardiologist Continue lasix 40mg  Recheck renal function BP looks good

## 2018-11-24 NOTE — Patient Instructions (Addendum)
Appointment to be scheduled with cardiologist We will call with kidney results  F/U 4 months

## 2018-11-24 NOTE — Assessment & Plan Note (Signed)
Chronic anemia of chronic diease MTF, baseline 9-10

## 2018-11-25 ENCOUNTER — Telehealth: Payer: Self-pay | Admitting: *Deleted

## 2018-11-25 LAB — BASIC METABOLIC PANEL
BUN/Creatinine Ratio: 23 (calc) — ABNORMAL HIGH (ref 6–22)
BUN: 41 mg/dL — ABNORMAL HIGH (ref 7–25)
CO2: 23 mmol/L (ref 20–32)
Calcium: 9.8 mg/dL (ref 8.6–10.4)
Chloride: 108 mmol/L (ref 98–110)
Creat: 1.76 mg/dL — ABNORMAL HIGH (ref 0.60–0.88)
Glucose, Bld: 111 mg/dL — ABNORMAL HIGH (ref 65–99)
Potassium: 5.1 mmol/L (ref 3.5–5.3)
Sodium: 141 mmol/L (ref 135–146)

## 2018-11-25 NOTE — Telephone Encounter (Signed)
Call placed to [facility], Dr. Mallie Mussel Smith's office.   Appointment scheduled for Wed 12/15/2018 @ 9:30am with Mickel Baas, NP.  Call placed to patient daughter, [name] made aware.

## 2018-11-25 NOTE — Telephone Encounter (Signed)
-----   Message from Deanna Rossetti, MD sent at 11/24/2018  1:51 PM EDT ----- Regarding: Pt needs appt with Dr. Tamala Julian for her CHF, last seen in Feb 2019- may have missed appt due to [place]   I have had to adjust her diuretics

## 2018-12-10 ENCOUNTER — Other Ambulatory Visit: Payer: Self-pay | Admitting: Family Medicine

## 2018-12-13 ENCOUNTER — Encounter: Payer: Self-pay | Admitting: Nurse Practitioner

## 2018-12-14 ENCOUNTER — Telehealth: Payer: Self-pay | Admitting: Nurse Practitioner

## 2018-12-14 NOTE — Telephone Encounter (Signed)

## 2018-12-14 NOTE — Progress Notes (Signed)
CARDIOLOGY OFFICE NOTE  Date:  12/15/2018    [name] Date of Birth: January 18, 1924 Medical Record #[id number]  PCP:  Alycia Rossetti, MD  Cardiologist:  Jennings Books   Chief Complaint  Patient presents with   Follow-up    Seen for Dr. Tamala Julian    History of Present Illness: [name] is a 85 y.o. female who presents today for a 5 month check. Seen for Dr. Tamala Julian.   She has a history of diastolic heart failure, diabetes, chronic kidney disease, hypertension, hyperlipidemia, prior stroke, spinal stenosis & bradycardia.   Last seen by Dr. Tamala Julian back in February 2019 - had seen [name], PA the month prior and endorsing orthopnea and swelling. Lasix had been increased. Some improvement on return visit noted.   The patient does not have symptoms concerning for COVID-19 infection (fever, chills, cough, or new shortness of breath).   Comes in today. Here with her daughter [name]. Here for check up - it's been about a year and a half since last seen. Doing ok. Chronic DOE - she is pretty sedentary. No chest pain. Has chronic swelling. She admits she likes salt. Probably does not drink enough water. More limited by her arthritis. Chronic anemia. Family feels like she is doing ok. No real concerns.   Past Medical History:  Diagnosis Date   Arthritis    Bilateral carpal tunnel syndrome    Cataracts, both eyes    Chronic diastolic CHF (congestive heart failure) ([place])    a. 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   mini stroke --  many yrs  ago   History of transfusion    Hyperlipidemia    Hypertension    Mitral regurgitation    PUD (peptic ulcer disease)    many yrs ago   Right leg weakness    Rotator cuff tear    left   Slipped intervertebral disc    Walker as ambulation aid     Past Surgical History:  Procedure Laterality Date   ABDOMINAL HYSTERECTOMY  1966   APPENDECTOMY     CARDIOVASCULAR STRESS TEST  06-18-2005   DR [place] STUDY/ NO EVIDENCE ISCHEMIA/ EF 75%   CARPAL TUNNEL RELEASE  09/17/2011   Procedure: CARPAL TUNNEL RELEASE;  Surgeon: Magnus Sinning, MD;  Location: [place] [facility];  Service: Orthopedics;  Laterality: Right;   CARPAL TUNNEL RELEASE  10/22/2011   Procedure: CARPAL TUNNEL RELEASE;  Surgeon: Magnus Sinning, MD;  Location: [place];  Service: Orthopedics;  Laterality: Left;   EYE SURGERY     LUMBAR LAMINECTOMY/DECOMPRESSION MICRODISCECTOMY  01/14/2012   Procedure: LUMBAR LAMINECTOMY/DECOMPRESSION MICRODISCECTOMY 3 LEVELS;  Surgeon: Magnus Sinning, MD;  Location: [facility];  Service: Orthopedics;  Laterality: N/A;  Decompression Laminectomy of L2 - L3, L3 - L4 and L4 - L5 Central  (X-Ray)   SHOULDER OPEN ROTATOR CUFF REPAIR  06/02/2012  Procedure: ROTATOR CUFF REPAIR SHOULDER OPEN;  Surgeon: Magnus Sinning, MD;  Location: [facility];  Service: Orthopedics;  Laterality: Left;  Left Shoulder Open Distal Clavicle Resection Anterior Acrominectomy and Rotator Cuff Repair   SPINE SURGERY     TRANSTHORACIC ECHOCARDIOGRAM  12-26-2008  DR Daneen Schick   NORMAL LVF/  EF  71%/   MILD ASYMMETRIC SEPTAL HYPERTROPHY/ MILD LEFT ATRIAL ENLARGEMENT/ MODERATELY ELEVATED ESTIMATED RIGHT VENTRICULAR SYSTOLIC PRESSURE/ MILD MITRAL  &  TRICUSPID VALVE REGURG.     Medications: Current Meds  Medication Sig   acetaminophen (TYLENOL) 500 MG tablet Take 1,500 mg by mouth every 6 (six) hours as needed for mild pain.   albuterol (PROVENTIL HFA;VENTOLIN HFA)  108 (90 Base) MCG/ACT inhaler Inhale 2 puffs into the lungs every 6 (six) hours as needed for wheezing or shortness of breath.   allopurinol (ZYLOPRIM) 300 MG tablet Take 1 tablet (300 mg total) by mouth daily with breakfast.   amLODipine (NORVASC) 5 MG tablet TAKE 1 TABLET BY MOUTH  DAILY   diclofenac sodium (VOLTAREN) 1 % GEL Apply 2 g topically 4 (four) times daily.   dorzolamide-timolol (COSOPT) 22.3-6.8 MG/ML ophthalmic solution Place 1 drop into the right eye 2 (two) times daily.   furosemide (LASIX) 40 MG tablet Take 1 tablet (40 mg total) by mouth daily.   gabapentin (NEURONTIN) 100 MG capsule TAKE 1 CAPSULE BY MOUTH AT  BEDTIME   hydrALAZINE (APRESOLINE) 10 MG tablet TAKE 1 TABLET BY MOUTH TWO  TIMES DAILY   hydrocortisone (ANUSOL-HC) 25 MG suppository Place 1 suppository (25 mg total) rectally 2 (two) times daily.   losartan (COZAAR) 25 MG tablet TAKE 1 TABLET(25 MG) BY MOUTH DAILY   omeprazole (PRILOSEC) 40 MG capsule Take 1 capsule (40 mg total) by mouth daily. For reflux   potassium chloride SA (K-DUR,KLOR-CON) 20 MEQ tablet Take 1 tablet with lasix qd prn   pravastatin (PRAVACHOL) 20 MG tablet TAKE 1 TABLET BY MOUTH  EVERY EVENING   timolol (BETIMOL) 0.5 % ophthalmic solution Place 1 drop into the left eye daily.     Allergies: No Known Allergies  Social History: The patient  reports that she quit smoking about 50 years ago. Her smoking use included cigarettes. She has a 30.00 pack-year smoking history. She has never used smokeless tobacco. She reports that she does not drink alcohol or use drugs.   Family History: The patient's family history includes Alcohol abuse in her brother; Arthritis in her sister, sister, sister, sister, and sister; Cancer in her sister; Diabetes in her sister; Heart attack in her son; Hypertension in her brother, father, mother, sister, sister, and sister; Stroke in her mother.   Review of Systems: Please see the history of present  illness.   All other systems are reviewed and negative.   Physical Exam: VS:  BP (!) 120/52 (BP Location: Left Arm, Patient Position: Sitting, Cuff Size: Large)    Pulse 70    Ht 5\' 4"  (1.626 m)    Wt 182 lb 1.9 oz (82.6 kg)    SpO2 97% Comment: ar rest   BMI 31.26 kg/m  .  BMI Body mass index is 31.26 kg/m.  Wt Readings from Last 3 Encounters:  12/15/18 182 lb 1.9 oz (82.6 kg)  11/24/18 184 lb (83.5 kg)  11/16/18 181 lb (82.1 kg)    General: Pleasant. Elderly. No acute distress.  HEENT: Normal.  Neck: Supple, no JVD, carotid bruits, or masses noted.  Cardiac: Regular rate and rhythm.  No murmurs, rubs, or gallops. No edema.  Respiratory:  Lungs are clear to auscultation bilaterally with normal work of breathing.  GI: Soft and nontender.  MS: No deformity or atrophy. Gait and ROM intact. Using a walker.  Skin: Warm and dry. Color is normal.  Neuro:  Strength and sensation are intact and no gross focal deficits noted.  Psych: Alert, appropriate and with normal affect.   LABORATORY DATA:  EKG:  EKG is not ordered today.   Lab Results  Component Value Date   WBC 7.2 11/17/2018   HGB 9.7 (L) 11/17/2018   HCT 30.3 (L) 11/17/2018   PLT 262 11/17/2018   GLUCOSE 111 (H) 11/24/2018   CHOL 162 11/17/2018   TRIG 165 (H) 11/17/2018   HDL 45 (L) 11/17/2018   LDLCALC 91 11/17/2018   ALT 6 11/17/2018   AST 13 11/17/2018   NA 141 11/24/2018   K 5.1 11/24/2018   CL 108 11/24/2018   CREATININE 1.76 (H) 11/24/2018   BUN 41 (H) 11/24/2018   CO2 23 11/24/2018   HGBA1C 6.2 (H) 11/17/2018   MICROALBUR 1.3 02/05/2015     BNP (last 3 results) Recent Labs    11/17/18 0959  BNP 196*    ProBNP (last 3 results) No results for input(s): PROBNP in the last 8760 hours.   Other Studies Reviewed Today:   2D Doppler echocardiogram June 11, 2017: Study Conclusions  - Left ventricle: The cavity size was normal. Wall thickness was normal. Systolic function was vigorous. The  estimated ejection fraction was in the range of 65% to 70%. Wall motion was normal; there were no regional wall motion abnormalities. Features are consistent with a pseudonormal left ventricular filling pattern, with concomitant abnormal relaxation and increased filling pressure (grade 2 diastolic dysfunction). - Mitral valve: There was mild regurgitation. - Left atrium: The atrium was severely dilated. - Pulmonary arteries: Systolic pressure was moderately increased. PA peak pressure: 62 mm Hg (S).    ASSESSMENT & PLAN:    1. Chronic diastolic HF - grade 2 by last echo - has good BP control. Needs salt restriction.   2. HTN - BP looks good. No changes made today.   3. DM - per PCP  4. CKD - stable.   5. GERD - not discussed  6. HLD - on statin  7. Chronic anemia - this probably drives some of her dyspnea as well.   8. COVID-19 Education: The signs and symptoms of COVID-19 were discussed with the patient and how to seek care for testing (follow up with PCP or arrange E-visit).  The importance of social distancing, staying at home, hand hygiene and wearing a mask when out in public were discussed today.  Current medicines are reviewed with the patient today.  The patient does not have concerns regarding medicines other than what has been noted above.  The following changes have been made:  See above.  Labs/ tests ordered today include:   No orders of the defined types were placed in this encounter.    Disposition:   FU with [place] in 6 months. Overall she looks pretty good and seems to be holding her own.    Patient is agreeable to this plan and will call if any problems develop in the interim.   SignedTruitt Merle, NP  12/15/2018 10:10 AM  [place] [address] [place],   [address] Phone: [phone] Fax: [phone]

## 2018-12-15 ENCOUNTER — Other Ambulatory Visit: Payer: Self-pay

## 2018-12-15 ENCOUNTER — Ambulatory Visit (INDEPENDENT_AMBULATORY_CARE_PROVIDER_SITE_OTHER): Payer: Medicare Other | Admitting: Nurse Practitioner

## 2018-12-15 ENCOUNTER — Encounter: Payer: Self-pay | Admitting: Nurse Practitioner

## 2018-12-15 VITALS — BP 120/52 | HR 70 | Ht 64.0 in | Wt 182.1 lb

## 2018-12-15 DIAGNOSIS — R0609 Other forms of dyspnea: Secondary | ICD-10-CM

## 2018-12-15 DIAGNOSIS — I5032 Chronic diastolic (congestive) heart failure: Secondary | ICD-10-CM

## 2018-12-15 DIAGNOSIS — I10 Essential (primary) hypertension: Secondary | ICD-10-CM

## 2018-12-15 DIAGNOSIS — N183 Chronic kidney disease, stage 3 unspecified: Secondary | ICD-10-CM

## 2018-12-15 DIAGNOSIS — Z7189 Other specified counseling: Secondary | ICD-10-CM

## 2018-12-15 DIAGNOSIS — I130 Hypertensive heart and chronic kidney disease with heart failure and stage 1 through stage 4 chronic kidney disease, or unspecified chronic kidney disease: Secondary | ICD-10-CM

## 2018-12-15 DIAGNOSIS — R0600 Dyspnea, unspecified: Secondary | ICD-10-CM

## 2018-12-15 NOTE — Patient Instructions (Addendum)
After Visit Summary:  We will be checking the following labs today - NONE   Medication Instructions:    Continue with your current medicines.    If you need a refill on your cardiac medications before your next appointment, please call your pharmacy.     Testing/Procedures To Be Arranged:  N/A  Follow-Up:   See Dr. Tamala Julian in 6 months    At [facility], you and your health needs are our priority.  As part of our continuing mission to provide you with exceptional heart care, we have created designated Provider Care Teams.  These Care Teams include your primary Cardiologist (physician) and Advanced Practice Providers (APPs -  Physician Assistants and Nurse Practitioners) who all work together to provide you with the care you need, when you need it.  Special Instructions:  . Stay safe, stay home, wash your hands for at least 20 seconds and wear a mask when out in public.  . It was good to talk with you today.  . Try to use less salt . Stay as active as you can.  . Elevate your legs as best you can..    Call the [place] office at [phone] if you have any questions, problems or concerns.

## 2019-01-28 ENCOUNTER — Other Ambulatory Visit: Payer: Self-pay | Admitting: Family Medicine

## 2019-02-02 ENCOUNTER — Other Ambulatory Visit: Payer: Self-pay

## 2019-02-02 ENCOUNTER — Ambulatory Visit (INDEPENDENT_AMBULATORY_CARE_PROVIDER_SITE_OTHER): Payer: Medicare Other | Admitting: *Deleted

## 2019-02-02 DIAGNOSIS — Z23 Encounter for immunization: Secondary | ICD-10-CM

## 2019-02-02 NOTE — Progress Notes (Signed)
Patient seen in office for Influenza Vaccination.   Tolerated IM administration well.   Immunization history updated.  

## 2019-03-16 DIAGNOSIS — H401113 Primary open-angle glaucoma, right eye, severe stage: Secondary | ICD-10-CM | POA: Diagnosis not present

## 2019-03-16 DIAGNOSIS — E119 Type 2 diabetes mellitus without complications: Secondary | ICD-10-CM | POA: Diagnosis not present

## 2019-03-16 DIAGNOSIS — H40012 Open angle with borderline findings, low risk, left eye: Secondary | ICD-10-CM | POA: Diagnosis not present

## 2019-03-16 DIAGNOSIS — Z961 Presence of intraocular lens: Secondary | ICD-10-CM | POA: Diagnosis not present

## 2019-03-22 ENCOUNTER — Other Ambulatory Visit: Payer: Self-pay

## 2019-03-22 NOTE — Patient Outreach (Signed)
[facility]) Care Management  03/22/2019  [name] 02-Apr-1924 [id number]   Medication Adherence call to [name] spoke with patient she wants a call back at a later day.Mrs. Meritt is showing past due on Pravastatin 20 mg under [place].   [place] Management Direct Dial [phone]  Fax [phone] [email or web address]

## 2019-04-01 ENCOUNTER — Other Ambulatory Visit: Payer: Self-pay

## 2019-04-01 NOTE — Patient Outreach (Signed)
[facility] [place]) Care Management  04/01/2019  [name] Sep 17, 1923 [id number]   Medication Adherence call to [name] spoke with patients daughter she is past due on Pravastatin 20 mg patients daughter explain patient takes 1 tablet daily and has plenty at this time reason why she has not order.[name] is showing past due under [name].   [place] Management Direct Dial [phone]  Fax [phone] [email or web address]

## 2019-04-27 ENCOUNTER — Encounter: Payer: Self-pay | Admitting: Family Medicine

## 2019-04-27 ENCOUNTER — Other Ambulatory Visit: Payer: Self-pay

## 2019-04-27 ENCOUNTER — Ambulatory Visit (INDEPENDENT_AMBULATORY_CARE_PROVIDER_SITE_OTHER): Payer: Medicare Other | Admitting: Family Medicine

## 2019-04-27 VITALS — BP 128/60 | HR 78 | Temp 98.7°F | Resp 14 | Ht 64.0 in | Wt 187.0 lb

## 2019-04-27 DIAGNOSIS — I5032 Chronic diastolic (congestive) heart failure: Secondary | ICD-10-CM | POA: Diagnosis not present

## 2019-04-27 DIAGNOSIS — E119 Type 2 diabetes mellitus without complications: Secondary | ICD-10-CM | POA: Diagnosis not present

## 2019-04-27 DIAGNOSIS — R609 Edema, unspecified: Secondary | ICD-10-CM

## 2019-04-27 DIAGNOSIS — I10 Essential (primary) hypertension: Secondary | ICD-10-CM | POA: Diagnosis not present

## 2019-04-27 DIAGNOSIS — R3129 Other microscopic hematuria: Secondary | ICD-10-CM

## 2019-04-27 DIAGNOSIS — N1831 Chronic kidney disease, stage 3a: Secondary | ICD-10-CM | POA: Diagnosis not present

## 2019-04-27 DIAGNOSIS — R630 Anorexia: Secondary | ICD-10-CM

## 2019-04-27 LAB — URINALYSIS, ROUTINE W REFLEX MICROSCOPIC
Bilirubin Urine: NEGATIVE
Glucose, UA: NEGATIVE
Ketones, ur: NEGATIVE
Leukocytes,Ua: NEGATIVE
Nitrite: NEGATIVE
Specific Gravity, Urine: 1.025 (ref 1.001–1.03)
WBC, UA: NONE SEEN /HPF (ref 0–5)
pH: 6 (ref 5.0–8.0)

## 2019-04-27 LAB — MICROSCOPIC MESSAGE

## 2019-04-27 NOTE — Addendum Note (Signed)
Addended by: Vic Blackbird F on: 04/27/2019 04:23 PM   Modules accepted: Orders

## 2019-04-27 NOTE — Assessment & Plan Note (Signed)
I think she has acute on chronic diastolic heart failure.  Her weight is up 5 pounds.  We will increase her Lasix to 40 mg once a day.  We will try to weigh her at home to monitor her fluid status.  Blood pressure looks good today.  Guards to her appetite we will add Ensure or boost once a day.  I will also check her A1c in the setting of prior diabetes mellitus though this has been diet controlled.  With regard to the confusion this may have just been her age and with her heart failure may be there is some change in her oxygenation unclear of course.  I will check a urinalysis as well.  She is not complaining of any UTI symptoms

## 2019-04-27 NOTE — Patient Instructions (Signed)
Take 1 whole table of the lasix  Drink ensure once once a day  We will call with lab results F/U pending reslts

## 2019-04-27 NOTE — Progress Notes (Signed)
Subjective:    Patient ID: [name], female    DOB: [birth year], 85 y.o.   MRN: [id number]  Patient presents for Fatigue (weakness, no appetite, confusion, some SOB and fluid in legs) Patient here with her daughter.  Her daughter is noticed some changes in her in the past couple of months.  She states that she feels fairly well but she does get a little short of breath when she is walking around. Daughter has noted that the past couple months that she does seem more short of breath even when walking short distances to the bathroom to the kitchen she has to sit for a minute.  They are concerned about her heart.  Is also noted more fluid on her legs.  She cannot get in with her cardiologist until February.  She is not had any chest pain or palpitations.  They have also noted that she seemed a little weaker than usual and her appetite has been up and down.  Weight is actually up 5 pounds since her visit in July.  There have been a few episodes where she seemed confused especially at nighttime but then last week during the middle of the day she was left at home and someone called and she was quite confused on where her daughter was and what was going on.  This only lasted about an hour then she seemed back to her baseline.  Weight up 5lbs  She has been taking lasix 1/2 tablet daily    Blood pressure at home has been good    Last A1C wasa 6.2%     Review Of Systems:  GEN- denies fatigue, fever, weight loss,weakness, recent illness HEENT- denies eye drainage, change in vision, nasal discharge, CVS- denies chest pain, palpitations RESP- + SOB, cough, wheeze ABD- denies N/V, change in stools, abd pain GU- denies dysuria, hematuria, dribbling, incontinence MSK- + joint pain, muscle aches, injury Neuro- denies headache, dizziness, syncope, seizure activity       Objective:    BP 128/60   Pulse 78   Temp 98.7 F (37.1 C) (Temporal)   Resp 14   Ht 5\' 4"  (1.626 m)   Wt 187 lb (84.8  kg)   SpO2 96%   BMI 32.10 kg/m  GEN- NAD, alert and oriented x3, non toxic appearing, walking sat remained 96% RA  HEENT- PERRL, EOMI, non injected sclera, pink conjunctiva, MMM, oropharynx clear Neck- Supple, no thyromegaly, no JVD CVS- RRR, no murmur RESP-CTAB ABD-NABS,soft,NT,ND EXT- chronic venous stais changes ,pitting edema 1+ Pulses- Radial 2+, DP- 1+,  walks with assistance       Assessment & Plan:      Problem List Items Addressed This Visit      Unprioritized   Chronic diastolic CHF (congestive heart failure) ([place]) - Primary    I think she has acute on chronic diastolic heart failure.  Her weight is up 5 pounds.  We will increase her Lasix to 40 mg once a day.  We will try to weigh her at home to monitor her fluid status.  Blood pressure looks good today.  Guards to her appetite we will add Ensure or boost once a day.  I will also check her A1c in the setting of prior diabetes mellitus though this has been diet controlled.  With regard to the confusion this may have just been her age and with her heart failure may be there is some change in her oxygenation unclear of course.  I will  check a urinalysis as well.  She is not complaining of any UTI symptoms      Relevant Orders   Comprehensive metabolic panel   CBC with Differential   Brain natriuretic peptide   Urinalysis, Routine w reflex microscopic (Completed)   CKD (chronic kidney disease), stage III   Diabetes mellitus, type II (HCC)   Relevant Orders   Hemoglobin A1c   Urinalysis, Routine w reflex microscopic (Completed)   Essential hypertension   Peripheral edema    Other Visit Diagnoses    Decreased appetite          Note: This dictation was prepared with Dragon dictation along with smaller phrase technology. Any transcriptional errors that result from this process are unintentional.

## 2019-04-28 LAB — CBC WITH DIFFERENTIAL/PLATELET
Absolute Monocytes: 439 cells/uL (ref 200–950)
Basophils Absolute: 29 cells/uL (ref 0–200)
Basophils Relative: 0.4 %
Eosinophils Absolute: 223 cells/uL (ref 15–500)
Eosinophils Relative: 3.1 %
HCT: 31.7 % — ABNORMAL LOW (ref 35.0–45.0)
Hemoglobin: 9.8 g/dL — ABNORMAL LOW (ref 11.7–15.5)
Lymphs Abs: 1224 cells/uL (ref 850–3900)
MCH: 28.3 pg (ref 27.0–33.0)
MCHC: 30.9 g/dL — ABNORMAL LOW (ref 32.0–36.0)
MCV: 91.6 fL (ref 80.0–100.0)
MPV: 9.9 fL (ref 7.5–12.5)
Monocytes Relative: 6.1 %
Neutro Abs: 5285 cells/uL (ref 1500–7800)
Neutrophils Relative %: 73.4 %
Platelets: 274 10*3/uL (ref 140–400)
RBC: 3.46 10*6/uL — ABNORMAL LOW (ref 3.80–5.10)
RDW: 15.7 % — ABNORMAL HIGH (ref 11.0–15.0)
Total Lymphocyte: 17 %
WBC: 7.2 10*3/uL (ref 3.8–10.8)

## 2019-04-28 LAB — COMPREHENSIVE METABOLIC PANEL
AG Ratio: 1.5 (calc) (ref 1.0–2.5)
ALT: 3 U/L — ABNORMAL LOW (ref 6–29)
AST: 10 U/L (ref 10–35)
Albumin: 4.2 g/dL (ref 3.6–5.1)
Alkaline phosphatase (APISO): 158 U/L — ABNORMAL HIGH (ref 37–153)
BUN/Creatinine Ratio: 14 (calc) (ref 6–22)
BUN: 36 mg/dL — ABNORMAL HIGH (ref 7–25)
CO2: 21 mmol/L (ref 20–32)
Calcium: 9.7 mg/dL (ref 8.6–10.4)
Chloride: 109 mmol/L (ref 98–110)
Creat: 2.5 mg/dL — ABNORMAL HIGH (ref 0.60–0.88)
Globulin: 2.8 g/dL (calc) (ref 1.9–3.7)
Glucose, Bld: 108 mg/dL — ABNORMAL HIGH (ref 65–99)
Potassium: 5.1 mmol/L (ref 3.5–5.3)
Sodium: 141 mmol/L (ref 135–146)
Total Bilirubin: 0.3 mg/dL (ref 0.2–1.2)
Total Protein: 7 g/dL (ref 6.1–8.1)

## 2019-04-28 LAB — BRAIN NATRIURETIC PEPTIDE: Brain Natriuretic Peptide: 268 pg/mL — ABNORMAL HIGH (ref <100)

## 2019-04-28 LAB — HEMOGLOBIN A1C
Hgb A1c MFr Bld: 5.9 % of total Hgb — ABNORMAL HIGH (ref <5.7)
Mean Plasma Glucose: 123 (calc)
eAG (mmol/L): 6.8 (calc)

## 2019-04-29 LAB — URINE CULTURE
MICRO NUMBER:: 1155625
SPECIMEN QUALITY:: ADEQUATE

## 2019-05-02 ENCOUNTER — Other Ambulatory Visit: Payer: Self-pay | Admitting: *Deleted

## 2019-05-02 ENCOUNTER — Other Ambulatory Visit: Payer: Self-pay

## 2019-05-02 ENCOUNTER — Encounter: Payer: Self-pay | Admitting: Family Medicine

## 2019-05-02 ENCOUNTER — Ambulatory Visit (INDEPENDENT_AMBULATORY_CARE_PROVIDER_SITE_OTHER): Payer: Medicare Other | Admitting: Family Medicine

## 2019-05-02 VITALS — BP 138/66 | HR 74 | Temp 98.4°F | Resp 16 | Ht 64.0 in | Wt 182.0 lb

## 2019-05-02 DIAGNOSIS — I5032 Chronic diastolic (congestive) heart failure: Secondary | ICD-10-CM | POA: Diagnosis not present

## 2019-05-02 DIAGNOSIS — N1832 Chronic kidney disease, stage 3b: Secondary | ICD-10-CM

## 2019-05-02 DIAGNOSIS — R3129 Other microscopic hematuria: Secondary | ICD-10-CM

## 2019-05-02 DIAGNOSIS — N179 Acute kidney failure, unspecified: Secondary | ICD-10-CM | POA: Diagnosis not present

## 2019-05-02 LAB — URINALYSIS, ROUTINE W REFLEX MICROSCOPIC
Bacteria, UA: NONE SEEN /HPF
Bilirubin Urine: NEGATIVE
Glucose, UA: NEGATIVE
Hyaline Cast: NONE SEEN /LPF
Ketones, ur: NEGATIVE
Leukocytes,Ua: NEGATIVE
Nitrite: NEGATIVE
Specific Gravity, Urine: 1.02 (ref 1.001–1.03)
WBC, UA: NONE SEEN /HPF (ref 0–5)
pH: 5.5 (ref 5.0–8.0)

## 2019-05-02 LAB — CBC WITH DIFFERENTIAL/PLATELET
Absolute Monocytes: 476 cells/uL (ref 200–950)
Basophils Absolute: 27 cells/uL (ref 0–200)
Basophils Relative: 0.4 %
Eosinophils Absolute: 218 cells/uL (ref 15–500)
Eosinophils Relative: 3.2 %
HCT: 29.8 % — ABNORMAL LOW (ref 35.0–45.0)
Hemoglobin: 9.4 g/dL — ABNORMAL LOW (ref 11.7–15.5)
Lymphs Abs: 1285 cells/uL (ref 850–3900)
MCH: 29.1 pg (ref 27.0–33.0)
MCHC: 31.5 g/dL — ABNORMAL LOW (ref 32.0–36.0)
MCV: 92.3 fL (ref 80.0–100.0)
MPV: 9.9 fL (ref 7.5–12.5)
Monocytes Relative: 7 %
Neutro Abs: 4794 cells/uL (ref 1500–7800)
Neutrophils Relative %: 70.5 %
Platelets: 264 10*3/uL (ref 140–400)
RBC: 3.23 10*6/uL — ABNORMAL LOW (ref 3.80–5.10)
RDW: 16 % — ABNORMAL HIGH (ref 11.0–15.0)
Total Lymphocyte: 18.9 %
WBC: 6.8 10*3/uL (ref 3.8–10.8)

## 2019-05-02 LAB — BASIC METABOLIC PANEL
BUN/Creatinine Ratio: 14 (calc) (ref 6–22)
BUN: 36 mg/dL — ABNORMAL HIGH (ref 7–25)
CO2: 23 mmol/L (ref 20–32)
Calcium: 9.4 mg/dL (ref 8.6–10.4)
Chloride: 110 mmol/L (ref 98–110)
Creat: 2.65 mg/dL — ABNORMAL HIGH (ref 0.60–0.88)
Glucose, Bld: 79 mg/dL (ref 65–99)
Potassium: 5.3 mmol/L (ref 3.5–5.3)
Sodium: 142 mmol/L (ref 135–146)

## 2019-05-02 LAB — MICROSCOPIC MESSAGE

## 2019-05-02 MED ORDER — AZELASTINE HCL 0.1 % NA SOLN: 1.0000 | Freq: Two times a day (BID) | NASAL | 6 refills | Status: DC

## 2019-05-02 NOTE — Assessment & Plan Note (Signed)
To diastolic heart failure.  Difficult to manage in the setting of her chronic kidney disease which had worsened.  Her weight is down 5 pounds and symptomatically she is feeling better so we will keep her Lasix at 20 mg.  I do have concern about stopping the Lasix altogether secondary to how quickly she retains fluid.  With regards to the hematuria this is still present in the urinalysis the urine culture did not show any infection.  She does not have the signs and symptoms of kidney stone.  We will check her renal function again stat.  If her creatinine continues to rise I would recommend sending her over to the emergency room to be evaluated.  In general she looks okay and at her baseline.  If her creatinine is about the same or even better than I will get this set up as an outpatient with a renal ultrasound and urgent referral to nephrology.  Patient as well as her daughter who is her caregiver voiced understanding.

## 2019-05-02 NOTE — Progress Notes (Addendum)
Subjective:    Patient ID: [name], female    DOB: 1923-08-22, 85 y.o.   MRN: [id number]  Patient presents for Follow-up (hematuria, AKF, CHF) Patient here for interim follow-up on her CHF and her acute on chronic renal failure.  She was seen last week with some weakness general fatigue shortness of breath.  Is most likely due to her CHF her BNP was elevated.  I had her increase her Lasix to already milligrams once a day however her creatinine returned at 2.5 though her baseline is typically 1.6-1.7.  I had her reduce back down to the 20 mg.  Her weight went down 5 pounds and she states that she is breathing better.  She still urinating normally it is not as dark as she has been drinking more water with her water pill.  She is also on boost for her appetite.  She does not have any new complaints today.   Review of her labs besides her creatinine her urinalysis showed 3+ hematuria microscopic with protein.  Denies any abdominal pain or discomfort    Review Of Systems:  GEN- + fatigue, fever, weight loss,weakness, recent illness HEENT- denies eye drainage, change in vision, nasal discharge, CVS- denies chest pain, palpitations RESP- denies SOB, cough, wheeze ABD- denies N/V, change in stools, abd pain GU- denies dysuria, hematuria, dribbling, incontinence MSK- denies joint pain, muscle aches, injury Neuro- denies headache, dizziness, syncope, seizure activity       Objective:    BP 138/66   Pulse 74   Temp 98.4 F (36.9 C) (Temporal)   Resp 16   Ht 5\' 4"  (1.626 m)   Wt 182 lb (82.6 kg)   SpO2 94%   BMI 31.24 kg/m  GEN- NAD, alert and oriented x3y Neck- Supple, no thyromegaly, no JVD CVS- RRR, no murmur RESP-CTAB ABD-NABS,soft,NT,ND EXT- chronic venous stais changes ,decreased pitting edema  Pulses- Radial 2+, DP- 1+,  walks with assistance        Assessment & Plan:  Patient is not on any blood thinners.  She also asked if she could use Astelin nasal spray she  has been using this intermittent from her daughter for her nasal congestion allergies and it works well.  I have sent this to the pharmacy   Problem List Items Addressed This Visit      Unprioritized   Chronic diastolic CHF (congestive heart failure) ([place])    To diastolic heart failure.  Difficult to manage in the setting of her chronic kidney disease which had worsened.  Her weight is down 5 pounds and symptomatically she is feeling better so we will keep her Lasix at 20 mg.  I do have concern about stopping the Lasix altogether secondary to how quickly she retains fluid.  With regards to the hematuria this is still present in the urinalysis the urine culture did not show any infection.  She does not have the signs and symptoms of kidney stone.  We will check her renal function again stat.  If her creatinine continues to rise I would recommend sending her over to the emergency room to be evaluated.  In general she looks okay and at her baseline.  If her creatinine is about the same or even better than I will get this set up as an outpatient with a renal ultrasound and urgent referral to nephrology.  Patient as well as her daughter who is her caregiver voiced understanding.  Addendum stat labs showed that she still has microscopic  hematuria the only 10-20 WBCs.  Her creatinine is now at 2.65.  I am can have her stop the losartan.  I will continue the Lasix secondary to her heart failure and my concern were that she will fluid overload be symptomatic at her age.  Then obtain a renal ultrasound and get her urgent appointment with nephrology.  SHe looked well today       Other Visit Diagnoses    Microscopic hematuria    -  Primary   Relevant Orders   Basic metabolic panel   Urinalysis, Routine w reflex microscopic   CBC with Differential   Acute renal failure superimposed on stage 3b chronic kidney disease, unspecified acute renal failure type ([place])       Relevant Orders   Basic metabolic panel    Urinalysis, Routine w reflex microscopic   CBC with Differential      Note: This dictation was prepared with Dragon dictation along with smaller phrase technology. Any transcriptional errors that result from this process are unintentional.

## 2019-05-02 NOTE — Addendum Note (Signed)
Addended by: Vic Blackbird F on: 05/02/2019 05:22 PM   Modules accepted: Orders

## 2019-05-02 NOTE — Patient Instructions (Signed)
F/U pending results   

## 2019-05-03 ENCOUNTER — Ambulatory Visit (HOSPITAL_COMMUNITY)
Admission: RE | Admit: 2019-05-03 | Discharge: 2019-05-03 | Disposition: A | Discharge status: Home / Self Care | Payer: Medicare Other | Source: Ambulatory Visit | Attending: Family Medicine | Admitting: Family Medicine

## 2019-05-03 DIAGNOSIS — N200 Calculus of kidney: Secondary | ICD-10-CM | POA: Diagnosis not present

## 2019-05-03 DIAGNOSIS — N179 Acute kidney failure, unspecified: Secondary | ICD-10-CM | POA: Insufficient documentation

## 2019-05-03 DIAGNOSIS — R3129 Other microscopic hematuria: Secondary | ICD-10-CM | POA: Diagnosis not present

## 2019-05-03 DIAGNOSIS — N1832 Chronic kidney disease, stage 3b: Secondary | ICD-10-CM | POA: Insufficient documentation

## 2019-05-12 ENCOUNTER — Encounter (HOSPITAL_COMMUNITY): Payer: Self-pay | Admitting: *Deleted

## 2019-05-12 ENCOUNTER — Emergency Department (HOSPITAL_COMMUNITY): Payer: Medicare Other

## 2019-05-12 ENCOUNTER — Other Ambulatory Visit: Payer: Self-pay

## 2019-05-12 ENCOUNTER — Ambulatory Visit (HOSPITAL_COMMUNITY): Admission: RE | Admit: 2019-05-12 | Payer: Medicare Other | Source: Ambulatory Visit

## 2019-05-12 ENCOUNTER — Emergency Department (HOSPITAL_COMMUNITY)
Admission: EM | Admit: 2019-05-12 | Discharge: 2019-05-12 | Disposition: A | Discharge status: Home / Self Care | Payer: Medicare Other | Attending: Emergency Medicine | Admitting: Emergency Medicine

## 2019-05-12 ENCOUNTER — Other Ambulatory Visit (HOSPITAL_COMMUNITY): Payer: Self-pay | Admitting: Nephrology

## 2019-05-12 ENCOUNTER — Inpatient Hospital Stay (HOSPITAL_COMMUNITY): Admission: RE | Admit: 2019-05-12 | Payer: Medicare Other | Source: Ambulatory Visit

## 2019-05-12 ENCOUNTER — Other Ambulatory Visit: Payer: Self-pay | Admitting: Nephrology

## 2019-05-12 DIAGNOSIS — Z20828 Contact with and (suspected) exposure to other viral communicable diseases: Secondary | ICD-10-CM | POA: Insufficient documentation

## 2019-05-12 DIAGNOSIS — I5032 Chronic diastolic (congestive) heart failure: Secondary | ICD-10-CM | POA: Diagnosis not present

## 2019-05-12 DIAGNOSIS — E1140 Type 2 diabetes mellitus with diabetic neuropathy, unspecified: Secondary | ICD-10-CM | POA: Diagnosis not present

## 2019-05-12 DIAGNOSIS — N189 Chronic kidney disease, unspecified: Secondary | ICD-10-CM | POA: Diagnosis not present

## 2019-05-12 DIAGNOSIS — D638 Anemia in other chronic diseases classified elsewhere: Secondary | ICD-10-CM | POA: Diagnosis not present

## 2019-05-12 DIAGNOSIS — Z79899 Other long term (current) drug therapy: Secondary | ICD-10-CM | POA: Diagnosis not present

## 2019-05-12 DIAGNOSIS — R319 Hematuria, unspecified: Secondary | ICD-10-CM | POA: Insufficient documentation

## 2019-05-12 DIAGNOSIS — I130 Hypertensive heart and chronic kidney disease with heart failure and stage 1 through stage 4 chronic kidney disease, or unspecified chronic kidney disease: Secondary | ICD-10-CM | POA: Insufficient documentation

## 2019-05-12 DIAGNOSIS — E1122 Type 2 diabetes mellitus with diabetic chronic kidney disease: Secondary | ICD-10-CM | POA: Diagnosis not present

## 2019-05-12 DIAGNOSIS — Z87891 Personal history of nicotine dependence: Secondary | ICD-10-CM | POA: Insufficient documentation

## 2019-05-12 DIAGNOSIS — N200 Calculus of kidney: Secondary | ICD-10-CM

## 2019-05-12 DIAGNOSIS — N179 Acute kidney failure, unspecified: Secondary | ICD-10-CM | POA: Diagnosis not present

## 2019-05-12 DIAGNOSIS — N184 Chronic kidney disease, stage 4 (severe): Secondary | ICD-10-CM | POA: Diagnosis not present

## 2019-05-12 DIAGNOSIS — R109 Unspecified abdominal pain: Secondary | ICD-10-CM | POA: Insufficient documentation

## 2019-05-12 DIAGNOSIS — N170 Acute kidney failure with tubular necrosis: Secondary | ICD-10-CM | POA: Diagnosis not present

## 2019-05-12 DIAGNOSIS — R2240 Localized swelling, mass and lump, unspecified lower limb: Secondary | ICD-10-CM | POA: Diagnosis present

## 2019-05-12 DIAGNOSIS — Z03818 Encounter for observation for suspected exposure to other biological agents ruled out: Secondary | ICD-10-CM | POA: Diagnosis not present

## 2019-05-12 DIAGNOSIS — I129 Hypertensive chronic kidney disease with stage 1 through stage 4 chronic kidney disease, or unspecified chronic kidney disease: Secondary | ICD-10-CM | POA: Diagnosis not present

## 2019-05-12 LAB — CBC WITH DIFFERENTIAL/PLATELET
Abs Immature Granulocytes: 0.04 10*3/uL (ref 0.00–0.07)
Basophils Absolute: 0 10*3/uL (ref 0.0–0.1)
Basophils Relative: 0 %
Eosinophils Absolute: 0.2 10*3/uL (ref 0.0–0.5)
Eosinophils Relative: 3 %
HCT: 32.6 % — ABNORMAL LOW (ref 36.0–46.0)
Hemoglobin: 9.8 g/dL — ABNORMAL LOW (ref 12.0–15.0)
Immature Granulocytes: 1 %
Lymphocytes Relative: 16 %
Lymphs Abs: 1.1 10*3/uL (ref 0.7–4.0)
MCH: 29.6 pg (ref 26.0–34.0)
MCHC: 30.1 g/dL (ref 30.0–36.0)
MCV: 98.5 fL (ref 80.0–100.0)
Monocytes Absolute: 0.4 10*3/uL (ref 0.1–1.0)
Monocytes Relative: 6 %
Neutro Abs: 5.3 10*3/uL (ref 1.7–7.7)
Neutrophils Relative %: 74 %
Platelets: 252 10*3/uL (ref 150–400)
RBC: 3.31 MIL/uL — ABNORMAL LOW (ref 3.87–5.11)
RDW: 16.5 % — ABNORMAL HIGH (ref 11.5–15.5)
WBC: 7.1 10*3/uL (ref 4.0–10.5)
nRBC: 0 % (ref 0.0–0.2)

## 2019-05-12 LAB — BASIC METABOLIC PANEL
Anion gap: 9 (ref 5–15)
BUN: 43 mg/dL — ABNORMAL HIGH (ref 8–23)
CO2: 20 mmol/L — ABNORMAL LOW (ref 22–32)
Calcium: 9.1 mg/dL (ref 8.9–10.3)
Chloride: 106 mmol/L (ref 98–111)
Creatinine, Ser: 2.91 mg/dL — ABNORMAL HIGH (ref 0.44–1.00)
GFR calc Af Amer: 15 mL/min — ABNORMAL LOW (ref >60)
GFR calc non Af Amer: 13 mL/min — ABNORMAL LOW (ref >60)
Glucose, Bld: 102 mg/dL — ABNORMAL HIGH (ref 70–99)
Potassium: 4.8 mmol/L (ref 3.5–5.1)
Sodium: 135 mmol/L (ref 135–145)

## 2019-05-12 MED ORDER — SODIUM CHLORIDE 0.9 % IV BOLUS: 500.0000 mL | Freq: Once | INTRAVENOUS | Status: DC

## 2019-05-12 NOTE — Discharge Instructions (Addendum)
Follow-up with your primary care provider and your nephrologist for ongoing care.

## 2019-05-12 NOTE — ED Provider Notes (Signed)
[facility] EMERGENCY DEPARTMENT Provider Note   CSN: [id number] Arrival date & time: 05/12/19  1336     History Chief Complaint  Patient presents with  . Nephrolithiasis    [name] is a 85 y.o. female.  85yo female with past medical history listed below including stage 4 CKD monitored by [facility] seen in the office today for follow-up on recent labs and ultrasound, had lab work checked in the office found to have worsening creatinine and was sent to the emergency room for possible admission.  Patient states that she has had pain in her right flank for the past week or so, was told by her doctor's office that she had blood in her urine but has not noticed any blood in the urine.  Patient reports occasional nausea, denies fevers, chills, changes in bowel habits, reports normal urine output.  Patient reports baseline lower leg swelling, denies chest pain or shortness of breath.  No other complaints or concerns.        Past Medical History:  Diagnosis Date  . Arthritis   . Bilateral carpal tunnel syndrome   . Cataracts, both eyes   . Chronic diastolic CHF (congestive heart failure) ([place])    a. Echo (09/2012): Mild LVH, EF 65-70%, moderately elevated RVSP, moderate TR, mild to moderate MR, mild LAE, grade 2 diastolic dysfunction  . Diabetes mellitus [facility]  . Diabetic neuropathy (HCC) both hands and legs  . GERD (gastroesophageal reflux disease)   . Glaucoma   . Gout   . History of echocardiogram    Echo 1/19:  EF 65-70, no RWMA, Gr 2 DD, mild MR, severe LAE, PASP 62   . History of falling RECENT FALL 1 WK AGO--   NO INJURY  . 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 History of transfusion   . Hyperlipidemia   . Hypertension   . Mitral regurgitation   . PUD (peptic ulcer disease)    many yrs ago  . Right leg weakness   . Rotator cuff tear    left  . Slipped intervertebral disc   . Walker as ambulation aid     Patient Active Problem  List   Diagnosis Date Noted  . OA (osteoarthritis) 04/18/2018  . Anemia 04/16/2018  . Senile purpura ([place]) 11/30/2017  . Dysphagia 07/28/2016  . Peripheral edema 07/02/2016  . CKD (chronic kidney disease), stage III 05/30/2015  . GERD (gastroesophageal reflux disease) 05/30/2015  . Peripheral neuropathy 03/19/2015  . Diabetes mellitus, type II ([place]) 02/05/2015  . Spinal stenosis of lumbar region 02/05/2015  . Chronic diastolic CHF (congestive heart failure) ([place]) 05/01/2014  . Essential hypertension 05/01/2014  . Hyperlipidemia 05/01/2014    Past Surgical History:  Procedure Laterality Date  . ABDOMINAL HYSTERECTOMY  1966  . APPENDECTOMY    . CARDIOVASCULAR STRESS TEST  06-18-2005   DR Daneen Schick   NORMAL STUDY/ NO EVIDENCE ISCHEMIA/ EF 75%  . CARPAL TUNNEL RELEASE  09/17/2011   Procedure: CARPAL TUNNEL RELEASE;  Surgeon: Magnus Sinning, MD;  Location: [place];  Service: Orthopedics;  Laterality: Right;  . CARPAL TUNNEL RELEASE  10/22/2011   Procedure: CARPAL TUNNEL RELEASE;  Surgeon: Magnus Sinning, MD;  Location: [place];  Service: Orthopedics;  Laterality: Left;  . EYE SURGERY    . LUMBAR LAMINECTOMY/DECOMPRESSION MICRODISCECTOMY  01/14/2012   Procedure: LUMBAR LAMINECTOMY/DECOMPRESSION MICRODISCECTOMY 3 LEVELS;  Surgeon: Laurice Record [place],  MD;  Location: [facility];  Service: Orthopedics;  Laterality: N/A;  Decompression Laminectomy of L2 - L3, L3 - L4 and L4 - L5 Central  (X-Ray)  . SHOULDER OPEN ROTATOR CUFF REPAIR  06/02/2012   Procedure: ROTATOR CUFF REPAIR SHOULDER OPEN;  Surgeon: Magnus Sinning, MD;  Location: [facility];  Service: Orthopedics;  Laterality: Left;  Left Shoulder Open Distal Clavicle Resection Anterior Acrominectomy and Rotator Cuff Repair  . SPINE SURGERY    . TRANSTHORACIC ECHOCARDIOGRAM  12-26-2008  DR Daneen Schick   NORMAL LVF/  EF  71%/   MILD ASYMMETRIC SEPTAL HYPERTROPHY/ MILD LEFT ATRIAL ENLARGEMENT/ MODERATELY  ELEVATED ESTIMATED RIGHT VENTRICULAR SYSTOLIC PRESSURE/ MILD MITRAL  &  TRICUSPID VALVE REGURG.     OB History   No obstetric history on file.     Family History  Problem Relation Age of Onset  . Hypertension Father   . Hypertension Mother   . Stroke Mother   . Diabetes Sister   . Hypertension Sister   . Cancer Sister   . Arthritis Sister   . Hypertension Brother   . Alcohol abuse Brother   . Hypertension Sister   . Arthritis Sister   . Hypertension Sister   . Arthritis Sister   . Arthritis Sister   . Arthritis Sister   . Heart attack Son   . Colon cancer Neg Hx     Social History   Tobacco Use  . Smoking status: Former Smoker    Packs/day: 1.00    Years: 30.00    Pack years: 30.00    Types: Cigarettes    Quit date: 05/28/1968    Years since quitting: 50.9  . Smokeless tobacco: Never Used  Substance Use Topics  . Alcohol use: No  . Drug use: No    Home Medications Prior to Admission medications   Medication Sig Start Date End Date Taking? Authorizing Provider  acetaminophen (TYLENOL) 500 MG tablet Take 1,500 mg by mouth every 6 (six) hours as needed for mild pain.    [provider]  albuterol (PROVENTIL HFA;VENTOLIN HFA) 108 (90 Base) MCG/ACT inhaler Inhale 2 puffs into the lungs every 6 (six) hours as needed for wheezing or shortness of breath. 08/26/18   Alycia Rossetti, MD  allopurinol (ZYLOPRIM) 300 MG tablet TAKE 1 TABLET BY MOUTH  DAILY WITH BREAKFAST 01/28/19   [place], Modena Nunnery, MD  amLODipine (NORVASC) 5 MG tablet TAKE 1 TABLET BY MOUTH  DAILY 11/16/18   [place], Modena Nunnery, MD  azelastine (ASTELIN) 0.1 % nasal spray Place 1 spray into both nostrils 2 (two) times daily. Use in each nostril as directed 05/02/19   Alycia Rossetti, MD  diclofenac sodium (VOLTAREN) 1 % GEL Apply 2 g topically 4 (four) times daily. 04/16/18   Alycia Rossetti, MD  dorzolamide-timolol (COSOPT) 22.3-6.8 MG/ML ophthalmic solution Place 1 drop into the right eye 2 (two)  times daily.    [provider]  furosemide (LASIX) 40 MG tablet TAKE ONE-HALF TABLET BY  MOUTH DAILY 01/28/19   Alycia Rossetti, MD  gabapentin (NEURONTIN) 100 MG capsule TAKE 1 CAPSULE BY MOUTH AT  BEDTIME 10/11/18   [place], Modena Nunnery, MD  hydrALAZINE (APRESOLINE) 10 MG tablet TAKE 1 TABLET BY MOUTH TWO  TIMES DAILY 11/16/18   Alycia Rossetti, MD  hydrocortisone (ANUSOL-HC) 25 MG suppository Place 1 suppository (25 mg total) rectally 2 (two) times daily. 02/23/18   Susy Frizzle, MD  omeprazole (PRILOSEC) 40 MG capsule  TAKE 1 CAPSULE BY MOUTH  DAILY FOR REFLUX 01/28/19   Alycia Rossetti, MD  potassium chloride SA (K-DUR) 20 MEQ tablet TAKE 1 TABLET BY MOUTH  DAILY WITH LASIX AS NEEDED 01/28/19   Alycia Rossetti, MD  pravastatin (PRAVACHOL) 20 MG tablet TAKE 1 TABLET BY MOUTH  EVERY EVENING 10/11/18   [place], Modena Nunnery, MD  timolol (BETIMOL) 0.5 % ophthalmic solution Place 1 drop into the left eye daily.    [provider]    Allergies    Patient has no known allergies.  Review of Systems   Review of Systems  Constitutional: Negative for chills, diaphoresis and fever.  Respiratory: Negative for shortness of breath.   Cardiovascular: Positive for leg swelling. Negative for chest pain.  Gastrointestinal: Positive for nausea. Negative for abdominal pain, constipation, diarrhea and vomiting.  Genitourinary: Positive for flank pain. Negative for difficulty urinating, dysuria, frequency and hematuria.  Musculoskeletal: Negative for back pain.  Skin: Negative for rash and wound.  Allergic/Immunologic: Positive for immunocompromised state.  Neurological: Negative for weakness.  Hematological: Does not bruise/bleed easily.  Psychiatric/Behavioral: Negative for confusion.  All other systems reviewed and are negative.   Physical Exam Updated Vital Signs BP (!) 164/80   Pulse 74   Temp 98 F (36.7 C) (Oral)   Resp 16   SpO2 99%   Physical Exam Vitals and nursing note  reviewed.  Constitutional:      General: She is not in acute distress.    Appearance: She is well-developed. She is not diaphoretic.  HENT:     Head: Normocephalic and atraumatic.  Cardiovascular:     Rate and Rhythm: Normal rate and regular rhythm.     Pulses: Normal pulses.     Heart sounds: Normal heart sounds.  Pulmonary:     Effort: Pulmonary effort is normal.     Breath sounds: Normal breath sounds.  Abdominal:     Palpations: Abdomen is soft.     Tenderness: There is no abdominal tenderness. There is no right CVA tenderness or left CVA tenderness.  Musculoskeletal:        General: No tenderness.     Right lower leg: Edema present.     Left lower leg: Edema present.  Skin:    General: Skin is warm and dry.     Findings: No erythema or rash.  Neurological:     Mental Status: She is alert and oriented to person, place, and time.  Psychiatric:        Behavior: Behavior normal.     ED Results / Procedures / Treatments   Labs (all labs ordered are listed, but only abnormal results are displayed) Labs Reviewed  BASIC METABOLIC PANEL - Abnormal; Notable for the following components:      Result Value   CO2 20 (*)    Glucose, Bld 102 (*)    BUN 43 (*)    Creatinine, Ser 2.91 (*)    GFR calc non Af Amer 13 (*)    GFR calc Af Amer 15 (*)    All other components within normal limits  CBC WITH DIFFERENTIAL/PLATELET - Abnormal; Notable for the following components:   RBC 3.31 (*)    Hemoglobin 9.8 (*)    HCT 32.6 (*)    RDW 16.5 (*)    All other components within normal limits  SARS CORONAVIRUS 2 (TAT 6-24 HRS)    EKG None  Radiology CT Renal Stone Study  Result Date: 05/12/2019 CLINICAL DATA:  Right flank pain for 2 weeks with hematuria. History of stones. EXAM: CT ABDOMEN AND PELVIS WITHOUT CONTRAST TECHNIQUE: Multidetector CT imaging of the abdomen and pelvis was performed following the standard protocol without IV contrast. COMPARISON:  06/03/2003. FINDINGS:  Lower chest: No acute abnormality. Hepatobiliary: Liver normal in size and attenuation. Several small calcifications consistent with healed granuloma. No masses. Normal gallbladder. No bile duct dilation. Pancreas: Unremarkable. No pancreatic ductal dilatation or surrounding inflammatory changes. Spleen: Normal in size without focal abnormality. Adrenals/Urinary Tract: No adrenal masses. Mild bilateral renal cortical thinning. Large intrarenal stone in the lower pole of the right kidney measuring 1.5 cm. 3 mm midpole stone. 1 mm nonobstructing stone in the upper pole the left kidney. Two small low-density lesions arise from the lower pole the right kidney consistent with cysts. No other defined masses. No hydronephrosis. Ureters are normal in course and in caliber.  No ureteral stones. Bladder is unremarkable. Stomach/Bowel: Small hiatal hernia. Stomach otherwise unremarkable. Small bowel and colon are normal in caliber. No wall thickening. No inflammation. Vascular/Lymphatic: Aorta is tortuous with dense atherosclerotic calcifications. No aneurysm. No enlarged lymph nodes. Reproductive: Status post hysterectomy. No adnexal masses. Other: No abdominal wall hernia or abnormality. No abdominopelvic ascites. Musculoskeletal: S-shaped curvature of the thoracolumbar spine. No fracture. No bone lesion. There are significant degenerative changes throughout the lumbar spine bilateral hip joint arthropathic changes. IMPRESSION: 1. No acute findings within the abdomen or pelvis. 2. No ureteral stone or obstructive uropathy. Large lower pole right intrarenal stone increased in size from prior CT. Small right midpole stone and tiny upper pole intrarenal stone. 3. Aortic atherosclerosis. Electronically Signed   By: Lajean Manes M.D.   On: 05/12/2019 17:43    Procedures Procedures (including critical care time)  Medications Ordered in ED Medications  sodium chloride 0.9 % bolus 500 mL (500 mLs Intravenous Not Given  05/12/19 1857)    ED Course  I have reviewed the triage vital signs and the nursing notes.  Pertinent labs & imaging results that were available during my care of the patient were reviewed by me and considered in my medical decision making (see chart for details).  Clinical Course as of May 11 1899  Thu May 12, 2019  1631 Patient AKI is worsening patient needs inpatient eval as patient creatinine has changed from 1.5 to2.6 to now 3.0 Patient also has hyperphosphatemia    [LM]  1634 Renal ultrasound on December 8 IMPRESSION: 1. There is a cluster of nonobstructive stones in the lower pole the right kidney. 2. A single cyst is seen in each kidney    [LM]  5241 85 year old female with history of chronic kidney disease sent by nephrology for worsening creatinine and concern for obstructive uropathy. On exam, patient states that she has mild right flank pain for the past week with nausea, was told that she has kidney stones.  Patient's abdomen is soft and nontender.  She does have mild pitting edema bilateral lower extremities, known history of congestive heart failure, PCP is managing her Lasix in regards to concern for her kidney disease. CBC and BMP reviewed, creatinine somewhat improved from her prior in the office today, currently 2.91 with a normal potassium.  CT without contrast completed due to right flank pain with history of kidney stones, no obstructive pathology at this time. Case discussed with Dr. Roderic Palau, ER attending who recommends consult with nephrology, patient does not seem to need admission at this time.  Case discussed with Dr. Joelyn Oms on-call  for nephrology who has reviewed patient's chart and agrees that patient does not need admission and can continue her outpatient work-up. Call to patient's daughter, discussed findings and plan of care today, family to pick patient up.  Patient will be given small fluid bolus prior to discharge.   [LM]    Clinical Course User  Index [LM] Roque Lias   MDM Rules/Calculators/A&P                      Final Clinical Impression(s) / ED Diagnoses Final diagnoses:  Chronic kidney disease, unspecified CKD stage    Rx / DC Orders ED Discharge Orders    None       Tacy Learn, PA-C 05/12/19 1900    Milton Ferguson, MD 05/12/19 2321

## 2019-05-12 NOTE — ED Notes (Signed)
Patient transported to CT 

## 2019-05-12 NOTE — ED Triage Notes (Signed)
Presents to the ED with kidney stones, denies any pain at present

## 2019-05-13 ENCOUNTER — Ambulatory Visit (HOSPITAL_COMMUNITY)
Admission: RE | Admit: 2019-05-13 | Discharge: 2019-05-13 | Disposition: A | Discharge status: Home / Self Care | Payer: Medicare Other | Source: Ambulatory Visit | Attending: Nephrology | Admitting: Nephrology

## 2019-05-13 ENCOUNTER — Ambulatory Visit (HOSPITAL_COMMUNITY): Admission: RE | Admit: 2019-05-13 | Payer: Medicare Other | Source: Ambulatory Visit

## 2019-05-13 DIAGNOSIS — N200 Calculus of kidney: Secondary | ICD-10-CM

## 2019-05-13 DIAGNOSIS — N170 Acute kidney failure with tubular necrosis: Secondary | ICD-10-CM | POA: Diagnosis not present

## 2019-05-13 DIAGNOSIS — N179 Acute kidney failure, unspecified: Secondary | ICD-10-CM | POA: Diagnosis not present

## 2019-05-13 LAB — SARS CORONAVIRUS 2 (TAT 6-24 HRS): SARS Coronavirus 2: NEGATIVE

## 2019-05-18 ENCOUNTER — Ambulatory Visit (INDEPENDENT_AMBULATORY_CARE_PROVIDER_SITE_OTHER): Payer: Medicare Other | Admitting: Urology

## 2019-05-18 ENCOUNTER — Encounter: Payer: Self-pay | Admitting: Urology

## 2019-05-18 VITALS — BP 130/70 | HR 60 | Temp 96.7°F

## 2019-05-18 DIAGNOSIS — R310 Gross hematuria: Secondary | ICD-10-CM | POA: Insufficient documentation

## 2019-05-18 DIAGNOSIS — N200 Calculus of kidney: Secondary | ICD-10-CM | POA: Diagnosis not present

## 2019-05-18 LAB — POCT URINALYSIS DIPSTICK
Bilirubin, UA: NEGATIVE
Glucose, UA: NEGATIVE
Ketones, UA: NEGATIVE
Leukocytes, UA: NEGATIVE
Nitrite, UA: NEGATIVE
Protein, UA: POSITIVE — AB
Spec Grav, UA: 1.025 (ref 1.010–1.025)
Urobilinogen, UA: NEGATIVE E.U./dL — AB
pH, UA: 5 (ref 5.0–8.0)

## 2019-05-18 MED ORDER — CIPROFLOXACIN HCL 500 MG PO TABS
500.0000 mg | ORAL_TABLET | Freq: Once | ORAL | Status: AC
  Administered 2019-05-18: 500 mg via ORAL

## 2019-05-18 NOTE — Progress Notes (Signed)
05/18/2019 1:59 PM   [name] 1924/03/11 [id number]  Referring provider: Alycia Rossetti, MD [address] [place],  [place] [address]  Nephrolithiasis  HPI: [name] is a 85yo with a hx of DMII, CHF here for evaluation of nephrolithiasis. She underwent CT stone study on 12/17 that showed bilateral renal calculi with a 1.5cm right renal calculus. She has been having intermittent gross hematuria. No LUTS. No dysuria. Urine is dark brown at times. No flank pain. No hx of nephrolithiasis. She smoked tobacco until 1979 and quit. No other associated symptoms. No exacerbating alleviating events. Gross hematuria started 4 weeks ago.    PMH: Past Medical History:  Diagnosis Date  . Arthritis   . Bilateral carpal tunnel syndrome   . Cataracts, both eyes   . Chronic diastolic CHF (congestive heart failure) ([place])    a. Echo (09/2012): Mild LVH, EF 65-70%, moderately elevated RVSP, moderate TR, mild to moderate MR, mild LAE, grade 2 diastolic dysfunction  . Diabetes mellitus [facility]  . Diabetic neuropathy (HCC) both hands and legs  . GERD (gastroesophageal reflux disease)   . Glaucoma   . Gout   . History of echocardiogram    Echo 1/19:  EF 65-70, no RWMA, Gr 2 DD, mild MR, severe LAE, PASP 62   . History of falling RECENT FALL 1 WK AGO--   NO INJURY  . 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 History of transfusion   . Hyperlipidemia   . Hypertension   . Mitral regurgitation   . PUD (peptic ulcer disease)    many yrs ago  . Right leg weakness   . Rotator cuff tear    left  . Slipped intervertebral disc   . Walker as ambulation aid     Surgical History: Past Surgical History:  Procedure Laterality Date  . ABDOMINAL HYSTERECTOMY  1966  . APPENDECTOMY    . CARDIOVASCULAR STRESS TEST  06-18-2005   DR Daneen Schick   NORMAL STUDY/ NO EVIDENCE ISCHEMIA/ EF 75%  . CARPAL TUNNEL RELEASE  09/17/2011   Procedure: CARPAL TUNNEL RELEASE;  Surgeon: Magnus Sinning, MD;  Location: [facility];  Service: Orthopedics;  Laterality: Right;  . CARPAL TUNNEL RELEASE  10/22/2011   Procedure: CARPAL TUNNEL RELEASE;  Surgeon: Magnus Sinning, MD;  Location: [facility];  Service: Orthopedics;  Laterality: Left;  . EYE SURGERY    . LUMBAR LAMINECTOMY/DECOMPRESSION MICRODISCECTOMY  01/14/2012   Procedure: LUMBAR LAMINECTOMY/DECOMPRESSION MICRODISCECTOMY 3 LEVELS;  Surgeon: Magnus Sinning, MD;  Location: [facility];  Service: Orthopedics;  Laterality: N/A;  Decompression Laminectomy of L2 - L3, L3 - L4 and L4 - L5 Central  (X-Ray)  . SHOULDER OPEN ROTATOR CUFF REPAIR  06/02/2012   Procedure: ROTATOR CUFF REPAIR SHOULDER OPEN;  Surgeon: Magnus Sinning, MD;  Location: [facility];  Service: Orthopedics;  Laterality: Left;  Left Shoulder Open Distal Clavicle Resection Anterior Acrominectomy and Rotator Cuff Repair  . SPINE SURGERY    . TRANSTHORACIC ECHOCARDIOGRAM  12-26-2008  DR Daneen Schick   NORMAL LVF/  EF  71%/   MILD ASYMMETRIC SEPTAL HYPERTROPHY/ MILD LEFT ATRIAL ENLARGEMENT/ MODERATELY ELEVATED ESTIMATED RIGHT VENTRICULAR SYSTOLIC PRESSURE/ MILD MITRAL  &  TRICUSPID VALVE REGURG.    Home Medications:  Allergies as of 05/18/2019   No Known Allergies     Medication List       Accurate  as of May 18, 2019  1:59 PM. If you have any questions, ask your nurse or doctor.        acetaminophen 500 MG tablet Commonly known as: TYLENOL Take 1,500 mg by mouth every 6 (six) hours as needed for mild pain.   albuterol 108 (90 Base) MCG/ACT inhaler Commonly known as: VENTOLIN HFA Inhale 2 puffs into the lungs every 6 (six) hours as needed for wheezing or shortness of breath.   allopurinol 300 MG tablet Commonly known as: ZYLOPRIM TAKE 1 TABLET BY MOUTH  DAILY WITH BREAKFAST   amLODipine 5 MG tablet Commonly known as: NORVASC TAKE 1 TABLET BY MOUTH  DAILY   azelastine 0.1 % nasal spray Commonly known as: ASTELIN Place 1  spray into both nostrils 2 (two) times daily. Use in each nostril as directed   diclofenac sodium 1 % Gel Commonly known as: VOLTAREN Apply 2 g topically 4 (four) times daily.   dorzolamide-timolol 22.3-6.8 MG/ML ophthalmic solution Commonly known as: COSOPT Place 1 drop into the right eye 2 (two) times daily.   furosemide 40 MG tablet Commonly known as: LASIX TAKE ONE-HALF TABLET BY  MOUTH DAILY   gabapentin 100 MG capsule Commonly known as: NEURONTIN TAKE 1 CAPSULE BY MOUTH AT  BEDTIME   hydrALAZINE 10 MG tablet Commonly known as: APRESOLINE TAKE 1 TABLET BY MOUTH TWO  TIMES DAILY   hydrocortisone 25 MG suppository Commonly known as: ANUSOL-HC Place 1 suppository (25 mg total) rectally 2 (two) times daily.   omeprazole 40 MG capsule Commonly known as: PRILOSEC TAKE 1 CAPSULE BY MOUTH  DAILY FOR REFLUX   potassium chloride SA 20 MEQ tablet Commonly known as: KLOR-CON TAKE 1 TABLET BY MOUTH  DAILY WITH LASIX AS NEEDED   pravastatin 20 MG tablet Commonly known as: PRAVACHOL TAKE 1 TABLET BY MOUTH  EVERY EVENING   timolol 0.5 % ophthalmic solution Commonly known as: BETIMOL Place 1 drop into the left eye daily.       Allergies: No Known Allergies  Family History: Family History  Problem Relation Age of Onset  . Hypertension Father   . Hypertension Mother   . Stroke Mother   . Diabetes Sister   . Hypertension Sister   . Cancer Sister   . Arthritis Sister   . Hypertension Brother   . Alcohol abuse Brother   . Hypertension Sister   . Arthritis Sister   . Hypertension Sister   . Arthritis Sister   . Arthritis Sister   . Arthritis Sister   . Heart attack Son   . Colon cancer Neg Hx     Social History:  reports that she quit smoking about 51 years ago. Her smoking use included cigarettes. She has a 30.00 pack-year smoking history. She has never used smokeless tobacco. She reports that she does not drink alcohol or use drugs.  ROS:  Urological Symptom  Review  Patient is experiencing the following symptoms: Blood in urine   Review of Systems  Gastrointestinal (upper)  : Negative for upper GI symptoms  Gastrointestinal (lower) : Negative for lower GI symptoms  Constitutional : Negative for symptoms  Skin: Negative for skin symptoms  Eyes: Negative for eye symptoms  Ear/Nose/Throat : Negative for Ear/Nose/Throat symptoms  Hematologic/Lymphatic: Negative for Hematologic/Lymphatic symptoms  Cardiovascular : Negative for cardiovascular symptoms  Respiratory : Negative for respiratory symptoms  Endocrine: Negative for endocrine symptoms  Musculoskeletal: Negative for musculoskeletal symptoms  Neurological: Negative for neurological symptoms  Psychologic: Negative for psychiatric  symptoms         05/18/19  CC: No chief complaint on file.   HPI:  Blood pressure 130/70, pulse 60, temperature (!) 96.7 F (35.9 C). NED. A&Ox3.   No respiratory distress   Abd soft, NT, ND Normal external genitalia with patent urethral meatus  Cystoscopy Procedure Note  Patient identification was confirmed, informed consent was obtained, and patient was prepped using Betadine solution.  Lidocaine jelly was administered per urethral meatus.    Procedure: - Flexible cystoscope introduced, without any difficulty.   - Thorough search of the bladder revealed:    normal urethral meatus    normal urothelium    no stones    no ulcers     no tumors    no urethral polyps    no trabeculation  - Ureteral orifices were normal in position and appearance.  Post-Procedure: - Patient tolerated the procedure well  Assessment/ Plan:    Return in about 6 months (around 11/16/2019) for renal US.  Nicolette Bang, MD                                   Physical Exam: BP 130/70   Pulse 60   Temp (!) 96.7 F (35.9 C)   Constitutional:  Alert and oriented, No acute distress. HEENT:  AT, moist mucus  membranes.  Trachea midline, no masses. Cardiovascular: No clubbing, cyanosis, or edema. Respiratory: Normal respiratory effort, no increased work of breathing. GI: Abdomen is soft, nontender, nondistended, no abdominal masses GU: No CVA tenderness Lymph: No cervical or inguinal lymphadenopathy. Skin: No rashes, bruises or suspicious lesions. Neurologic: Grossly intact, no focal deficits, moving all 4 extremities. Psychiatric: Normal mood and affect.  Laboratory Data: Lab Results  Component Value Date   WBC 7.1 05/12/2019   HGB 9.8 (L) 05/12/2019   HCT 32.6 (L) 05/12/2019   MCV 98.5 05/12/2019   PLT 252 05/12/2019    Lab Results  Component Value Date   CREATININE 2.91 (H) 05/12/2019    No results found for: PSA  No results found for: TESTOSTERONE  Lab Results  Component Value Date   HGBA1C 5.9 (H) 04/27/2019    Urinalysis    Component Value Date/Time   COLORURINE YELLOW 05/02/2019 1424   APPEARANCEUR CLOUDY (A) 05/02/2019 1424   LABSPEC 1.020 05/02/2019 1424   PHURINE 5.5 05/02/2019 1424   GLUCOSEU NEGATIVE 05/02/2019 1424   HGBUR 3+ (A) 05/02/2019 1424   BILIRUBINUR neg 05/18/2019 1357   KETONESUR NEGATIVE 05/02/2019 1424   PROTEINUR Positive (A) 05/18/2019 1357   PROTEINUR 3+ (A) 05/02/2019 1424   UROBILINOGEN negative (A) 05/18/2019 1357   UROBILINOGEN 0.2 07/19/2008 1547   NITRITE neg 05/18/2019 1357   NITRITE NEGATIVE 05/02/2019 1424   LEUKOCYTESUR Negative 05/18/2019 1357   LEUKOCYTESUR NEGATIVE 05/02/2019 1424    Lab Results  Component Value Date   BACTERIA NONE SEEN 05/02/2019    Pertinent Imaging: CT Stone Study. Multiple bilateral calculi No results found for this or any previous visit. No results found for this or any previous visit. No results found for this or any previous visit. No results found for this or any previous visit. Results for orders placed during the hospital encounter of 05/13/19  US RENAL   Narrative CLINICAL DATA:   Acute renal failure.  EXAM: RENAL / URINARY TRACT ULTRASOUND COMPLETE  COMPARISON:  CT abdomen and pelvis 05/12/2019.  FINDINGS: Right Kidney:  Renal  measurements: 11.3 x 5.3 x 5.7 cm = volume: 177.2 mL. Cortical echogenicity is mildly increased. No solid mass or hydronephrosis visualized. 1.8 cm cyst lower pole noted. Stone in the lower pole measuring approximately 2 cm is identified.  Left Kidney:  Renal measurements: 12.1 x 5.1 x 4.9 cm = volume: 157.0 mL. Cortical echogenicity is mildly increased. No solid mass or hydronephrosis visualized. 1.5 cm cyst noted.  Bladder:  Appears normal for degree of bladder distention.  Other:  None.  IMPRESSION: Negative for hydronephrosis or other acute abnormality.  Increased cortical echogenicity compatible with medical renal disease.  Nonobstructing stone lower pole right kidney.   Electronically Signed   By: Inge Rise M.D.   On: 05/13/2019 10:04    No results found for this or any previous visit. No results found for this or any previous visit. Results for orders placed during the hospital encounter of 05/12/19  CT Renal Stone Study   Narrative CLINICAL DATA:  Right flank pain for 2 weeks with hematuria. History of stones.  EXAM: CT ABDOMEN AND PELVIS WITHOUT CONTRAST  TECHNIQUE: Multidetector CT imaging of the abdomen and pelvis was performed following the standard protocol without IV contrast.  COMPARISON:  06/03/2003.  FINDINGS: Lower chest: No acute abnormality.  Hepatobiliary: Liver normal in size and attenuation. Several small calcifications consistent with healed granuloma. No masses. Normal gallbladder. No bile duct dilation.  Pancreas: Unremarkable. No pancreatic ductal dilatation or surrounding inflammatory changes.  Spleen: Normal in size without focal abnormality.  Adrenals/Urinary Tract: No adrenal masses.  Mild bilateral renal cortical thinning. Large intrarenal stone in the lower  pole of the right kidney measuring 1.5 cm. 3 mm midpole stone. 1 mm nonobstructing stone in the upper pole the left kidney. Two small low-density lesions arise from the lower pole the right kidney consistent with cysts. No other defined masses. No hydronephrosis.  Ureters are normal in course and in caliber.  No ureteral stones.  Bladder is unremarkable.  Stomach/Bowel: Small hiatal hernia. Stomach otherwise unremarkable. Small bowel and colon are normal in caliber. No wall thickening. No inflammation.  Vascular/Lymphatic: Aorta is tortuous with dense atherosclerotic calcifications. No aneurysm.  No enlarged lymph nodes.  Reproductive: Status post hysterectomy. No adnexal masses.  Other: No abdominal wall hernia or abnormality. No abdominopelvic ascites.  Musculoskeletal: S-shaped curvature of the thoracolumbar spine. No fracture. No bone lesion. There are significant degenerative changes throughout the lumbar spine bilateral hip joint arthropathic changes.  IMPRESSION: 1. No acute findings within the abdomen or pelvis. 2. No ureteral stone or obstructive uropathy. Large lower pole right intrarenal stone increased in size from prior CT. Small right midpole stone and tiny upper pole intrarenal stone. 3. Aortic atherosclerosis.   Electronically Signed   By: Lajean Manes M.D.   On: 05/12/2019 17:43     Assessment & Plan:    1. Kidney stones -observation, RTC 6 months with renal US - POCT urinalysis dipstick  2. Gross hematuria -related to renal calculi   No follow-ups on file.  Nicolette Bang, MD  [facility] [place]

## 2019-05-18 NOTE — Patient Instructions (Signed)

## 2019-06-01 ENCOUNTER — Ambulatory Visit: Payer: Medicare Other | Admitting: Urology

## 2019-06-07 DIAGNOSIS — N184 Chronic kidney disease, stage 4 (severe): Secondary | ICD-10-CM | POA: Diagnosis not present

## 2019-06-07 DIAGNOSIS — N179 Acute kidney failure, unspecified: Secondary | ICD-10-CM | POA: Diagnosis not present

## 2019-06-07 DIAGNOSIS — D638 Anemia in other chronic diseases classified elsewhere: Secondary | ICD-10-CM | POA: Diagnosis not present

## 2019-06-07 DIAGNOSIS — N200 Calculus of kidney: Secondary | ICD-10-CM | POA: Diagnosis not present

## 2019-06-07 DIAGNOSIS — Z79899 Other long term (current) drug therapy: Secondary | ICD-10-CM | POA: Diagnosis not present

## 2019-06-08 ENCOUNTER — Other Ambulatory Visit: Payer: Self-pay | Admitting: Family Medicine

## 2019-06-08 ENCOUNTER — Ambulatory Visit: Payer: Medicare Other | Admitting: Urology

## 2019-06-09 ENCOUNTER — Other Ambulatory Visit: Payer: Self-pay | Admitting: Family Medicine

## 2019-06-09 ENCOUNTER — Other Ambulatory Visit: Payer: Self-pay

## 2019-06-09 ENCOUNTER — Ambulatory Visit (INDEPENDENT_AMBULATORY_CARE_PROVIDER_SITE_OTHER): Payer: Medicare Other | Admitting: Family Medicine

## 2019-06-09 ENCOUNTER — Encounter: Payer: Self-pay | Admitting: Family Medicine

## 2019-06-09 VITALS — BP 128/68 | HR 70 | Temp 97.5°F | Resp 12

## 2019-06-09 DIAGNOSIS — N390 Urinary tract infection, site not specified: Secondary | ICD-10-CM | POA: Diagnosis not present

## 2019-06-09 DIAGNOSIS — G253 Myoclonus: Secondary | ICD-10-CM

## 2019-06-09 LAB — URINALYSIS, ROUTINE W REFLEX MICROSCOPIC
Bilirubin Urine: NEGATIVE
Glucose, UA: NEGATIVE
Ketones, ur: NEGATIVE
Leukocytes,Ua: NEGATIVE
Nitrite: NEGATIVE
Specific Gravity, Urine: 1.013 (ref 1.001–1.03)
pH: 5.5 (ref 5.0–8.0)

## 2019-06-09 LAB — MICROSCOPIC MESSAGE

## 2019-06-09 MED ORDER — PROPRANOLOL HCL ER 60 MG PO CP24: 60.0000 mg | ORAL_CAPSULE | Freq: Every day | ORAL | 1 refills | Status: DC

## 2019-06-09 NOTE — Addendum Note (Signed)
Addended by: Jenna Luo T on: 06/09/2019 01:10 PM   Modules accepted: Orders

## 2019-06-09 NOTE — Progress Notes (Signed)
Subjective:    Patient ID: [name], female    DOB: 08/17/1923, 86 y.o.   MRN: [id number]  HPI Patient received COVID-19 vaccination earlier this week.  Per the family's report she has always had an essential tremor.  However since she had the Covid vaccine the essential tremor has worsened.  She is visibly shaking.  She has a high-frequency resting tremor in both upper extremities as well as in her head and her neck.  Per their report this is much worse than her baseline.  She is also developed frequent myoclonic jerks in her arms.  Today while talking to the patient her left arm suddenly jerked as well as her right arm at various points during her visit.  She denies any seizure activity.  She denies any fever or chills.  She denies any shortness of breath or chest pain.  She denies any abdominal pain nausea or vomiting.  Urinalysis today shows blood and protein but apparently this is a chronic finding for her and is being evaluated by nephrology.  There is no evidence of urinary tract infection.  However the jerking is so severe and the tremor is worsening to the point that she feels unsteady on her feet. Past Medical History:  Diagnosis Date  . Arthritis   . Bilateral carpal tunnel syndrome   . Cataracts, both eyes   . Chronic diastolic CHF (congestive heart failure) ([place])    a. Echo (09/2012): Mild LVH, EF 65-70%, moderately elevated RVSP, moderate TR, mild to moderate MR, mild LAE, grade 2 diastolic dysfunction  . Diabetes mellitus [facility]  . Diabetic neuropathy (HCC) both hands and legs  . GERD (gastroesophageal reflux disease)   . Glaucoma   . Gout   . History of echocardiogram    Echo 1/19:  EF 65-70, no RWMA, Gr 2 DD, mild MR, severe LAE, PASP 62   . History of falling RECENT FALL 1 WK AGO--   NO INJURY  . 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 History of transfusion   . Hyperlipidemia   . Hypertension   . Mitral regurgitation   . PUD (peptic ulcer  disease)    many yrs ago  . Right leg weakness   . Rotator cuff tear    left  . Slipped intervertebral disc   . Walker as ambulation aid    Past Surgical History:  Procedure Laterality Date  . ABDOMINAL HYSTERECTOMY  1966  . APPENDECTOMY    . CARDIOVASCULAR STRESS TEST  06-18-2005   DR Daneen Schick   NORMAL STUDY/ NO EVIDENCE ISCHEMIA/ EF 75%  . CARPAL TUNNEL RELEASE  09/17/2011   Procedure: CARPAL TUNNEL RELEASE;  Surgeon: Magnus Sinning, MD;  Location: [facility];  Service: Orthopedics;  Laterality: Right;  . CARPAL TUNNEL RELEASE  10/22/2011   Procedure: CARPAL TUNNEL RELEASE;  Surgeon: Magnus Sinning, MD;  Location: [facility];  Service: Orthopedics;  Laterality: Left;  . EYE SURGERY    . LUMBAR LAMINECTOMY/DECOMPRESSION MICRODISCECTOMY  01/14/2012   Procedure: LUMBAR LAMINECTOMY/DECOMPRESSION MICRODISCECTOMY 3 LEVELS;  Surgeon: Magnus Sinning, MD;  Location[facility];  Service: Orthopedics;  Laterality: N/A;  Decompression Laminectomy of L2 - L3, L3 - L4 and L4 - L5 Central  (X-Ray)  . SHOULDER OPEN ROTATOR CUFF REPAIR  06/02/2012   Procedure: ROTATOR CUFF REPAIR SHOULDER OPEN;  Surgeon: Magnus Sinning, MD;  Location[facility];  Service:  Orthopedics;  Laterality: Left;  Left Shoulder Open Distal Clavicle Resection Anterior Acrominectomy and Rotator Cuff Repair  . SPINE SURGERY    . TRANSTHORACIC ECHOCARDIOGRAM  12-26-2008  DR Daneen Schick   NORMAL LVF/  EF  71%/   MILD ASYMMETRIC SEPTAL HYPERTROPHY/ MILD LEFT ATRIAL ENLARGEMENT/ MODERATELY ELEVATED ESTIMATED RIGHT VENTRICULAR SYSTOLIC PRESSURE/ MILD MITRAL  &  TRICUSPID VALVE REGURG.   Current Outpatient Medications on File Prior to Visit  Medication Sig Dispense Refill  . acetaminophen (TYLENOL) 500 MG tablet Take 1,500 mg by mouth every 6 (six) hours as needed for mild pain.    Marland Kitchen albuterol (PROVENTIL HFA;VENTOLIN HFA) 108 (90 Base) MCG/ACT inhaler Inhale 2 puffs into the lungs every 6 (six) hours  as needed for wheezing or shortness of breath. 3 Inhaler 3  . allopurinol (ZYLOPRIM) 300 MG tablet TAKE 1 TABLET BY MOUTH  DAILY WITH BREAKFAST 90 tablet 3  . amLODipine (NORVASC) 5 MG tablet TAKE 1 TABLET BY MOUTH  DAILY 90 tablet 3  . azelastine (ASTELIN) 0.1 % nasal spray Place 1 spray into both nostrils 2 (two) times daily. Use in each nostril as directed 30 mL 6  . diclofenac sodium (VOLTAREN) 1 % GEL Apply 2 g topically 4 (four) times daily. 300 g 3  . dorzolamide-timolol (COSOPT) 22.3-6.8 MG/ML ophthalmic solution Place 1 drop into the right eye 2 (two) times daily.    . furosemide (LASIX) 40 MG tablet TAKE ONE-HALF TABLET BY  MOUTH DAILY 45 tablet 3  . gabapentin (NEURONTIN) 100 MG capsule TAKE 1 CAPSULE BY MOUTH AT  BEDTIME 90 capsule 3  . hydrALAZINE (APRESOLINE) 10 MG tablet TAKE 1 TABLET BY MOUTH  TWICE DAILY 180 tablet 1  . hydrocortisone (ANUSOL-HC) 25 MG suppository Place 1 suppository (25 mg total) rectally 2 (two) times daily. 12 suppository 0  . omeprazole (PRILOSEC) 40 MG capsule TAKE 1 CAPSULE BY MOUTH  DAILY FOR REFLUX 90 capsule 3  . potassium chloride SA (K-DUR) 20 MEQ tablet TAKE 1 TABLET BY MOUTH  DAILY WITH LASIX AS NEEDED 90 tablet 3  . pravastatin (PRAVACHOL) 20 MG tablet TAKE 1 TABLET BY MOUTH  EVERY EVENING 90 tablet 1  . timolol (BETIMOL) 0.5 % ophthalmic solution Place 1 drop into the left eye daily.     No current facility-administered medications on file prior to visit.   No Known Allergies Social History   Socioeconomic History  . Marital status: Widowed    Spouse name: Not on file  . Number of children: Not on file  . Years of education: Not on file  . Highest education level: Not on file  Occupational History  . Not on file  Tobacco Use  . Smoking status: Former Smoker    Packs/day: 1.00    Years: 30.00    Pack years: 30.00    Types: Cigarettes    Quit date: 05/28/1968    Years since quitting: 51.0  . Smokeless tobacco: Never Used  Substance and  Sexual Activity  . Alcohol use: No  . Drug use: No  . Sexual activity: Not on file  Other Topics Concern  . Not on file  Social History Narrative  . Not on file   Social Determinants of Health   Financial Resource Strain:   . Difficulty of Paying Living Expenses: Not on file  Food Insecurity:   . Worried About Charity fundraiser in the Last Year: Not on file  . Ran Out of Food in the Last Year:  Not on file  Transportation Needs:   . Lack of Transportation (Medical): Not on file  . Lack of Transportation (Non-Medical): Not on file  Physical Activity:   . Days of Exercise per Week: Not on file  . Minutes of Exercise per Session: Not on file  Stress:   . Feeling of Stress : Not on file  Social Connections:   . Frequency of Communication with Friends and Family: Not on file  . Frequency of Social Gatherings with Friends and Family: Not on file  . Attends Religious Services: Not on file  . Active Member of Clubs or Organizations: Not on file  . Attends Archivist Meetings: Not on file  . Marital Status: Not on file  Intimate Partner Violence:   . Fear of Current or Ex-Partner: Not on file  . Emotionally Abused: Not on file  . Physically Abused: Not on file  . Sexually Abused: Not on file    Review of Systems  All other systems reviewed and are negative.      Objective:   Physical Exam Vitals reviewed.  Constitutional:      General: She is not in acute distress.    Appearance: She is well-developed. She is not diaphoretic.  Cardiovascular:     Rate and Rhythm: Normal rate and regular rhythm.     Heart sounds: Normal heart sounds.  Pulmonary:     Effort: Pulmonary effort is normal. No respiratory distress.     Breath sounds: Normal breath sounds. No stridor. No wheezing or rales.  Chest:     Chest wall: No tenderness.  Abdominal:     General: Bowel sounds are normal. There is no distension.     Palpations: Abdomen is soft. There is no mass.      Tenderness: There is no abdominal tenderness. There is no guarding or rebound.  Genitourinary:    Rectum: Normal anal tone.  Neurological:     General: No focal deficit present.     Mental Status: She is alert.     Cranial Nerves: Cranial nerves are intact.     Sensory: Sensation is intact.     Motor: Tremor present. No abnormal muscle tone or seizure activity.           Assessment & Plan:  Myoclonic jerking - Plan: CBC with Differential, COMPLETE METABOLIC PANEL WITH GFR, Urinalysis, Routine w reflex microscopic  I see no evidence of jaundice on her exam to suggest underlying acute liver failure.  She has no history of cirrhosis to suggest asterixis.  She does have a history of chronic kidney disease.  Therefore I will check a CMP to evaluate for any electrolyte disturbances or sudden decline in her renal function.  However I suspect that this is underlying cortical myoclonus related to aging.  I believe she has an essential tremor and I believe the myoclonus is just another manifestation of this related degradation that occurs with aging.  I will start the patient on propranolol for essential tremor to see if this will help calm it down as I also believe this medication has very few if any side effects for her.  However if the myoclonic jerking continues, next step will be to obtain an MRI of the brain and also consider starting patient on Keppra.  Await the results of the next 3 to 4 days to see how her symptoms progress on the propranolol.  I believe that the Covid vaccine is likely unrelated to the development of this  phenomenon

## 2019-06-10 ENCOUNTER — Other Ambulatory Visit: Payer: Self-pay | Admitting: Family Medicine

## 2019-06-10 ENCOUNTER — Ambulatory Visit: Payer: Medicare Other | Admitting: Family Medicine

## 2019-06-10 LAB — COMPLETE METABOLIC PANEL WITH GFR
AG Ratio: 1.5 (calc) (ref 1.0–2.5)
ALT: 7 U/L (ref 6–29)
AST: 12 U/L (ref 10–35)
Albumin: 3.9 g/dL (ref 3.6–5.1)
Alkaline phosphatase (APISO): 157 U/L — ABNORMAL HIGH (ref 37–153)
BUN/Creatinine Ratio: 13 (calc) (ref 6–22)
BUN: 45 mg/dL — ABNORMAL HIGH (ref 7–25)
CO2: 19 mmol/L — ABNORMAL LOW (ref 20–32)
Calcium: 9.4 mg/dL (ref 8.6–10.4)
Chloride: 114 mmol/L — ABNORMAL HIGH (ref 98–110)
Creat: 3.41 mg/dL — ABNORMAL HIGH (ref 0.60–0.88)
GFR, Est African American: 13 mL/min/{1.73_m2} — ABNORMAL LOW (ref >=60)
GFR, Est Non African American: 11 mL/min/{1.73_m2} — ABNORMAL LOW (ref >=60)
Globulin: 2.6 g/dL (calc) (ref 1.9–3.7)
Glucose, Bld: 106 mg/dL — ABNORMAL HIGH (ref 65–99)
Potassium: 5.8 mmol/L — ABNORMAL HIGH (ref 3.5–5.3)
Sodium: 141 mmol/L (ref 135–146)
Total Bilirubin: 0.3 mg/dL (ref 0.2–1.2)
Total Protein: 6.5 g/dL (ref 6.1–8.1)

## 2019-06-10 LAB — CBC WITH DIFFERENTIAL/PLATELET
Absolute Monocytes: 403 cells/uL (ref 200–950)
Basophils Absolute: 31 cells/uL (ref 0–200)
Basophils Relative: 0.5 %
Eosinophils Absolute: 174 cells/uL (ref 15–500)
Eosinophils Relative: 2.8 %
HCT: 28.3 % — ABNORMAL LOW (ref 35.0–45.0)
Hemoglobin: 8.9 g/dL — ABNORMAL LOW (ref 11.7–15.5)
Lymphs Abs: 949 cells/uL (ref 850–3900)
MCH: 29.2 pg (ref 27.0–33.0)
MCHC: 31.4 g/dL — ABNORMAL LOW (ref 32.0–36.0)
MCV: 92.8 fL (ref 80.0–100.0)
MPV: 9.5 fL (ref 7.5–12.5)
Monocytes Relative: 6.5 %
Neutro Abs: 4644 cells/uL (ref 1500–7800)
Neutrophils Relative %: 74.9 %
Platelets: 233 10*3/uL (ref 140–400)
RBC: 3.05 10*6/uL — ABNORMAL LOW (ref 3.80–5.10)
RDW: 16.5 % — ABNORMAL HIGH (ref 11.0–15.0)
Total Lymphocyte: 15.3 %
WBC: 6.2 10*3/uL (ref 3.8–10.8)

## 2019-06-10 LAB — URINE CULTURE
MICRO NUMBER:: 10042375
Result:: NO GROWTH
SPECIMEN QUALITY:: ADEQUATE

## 2019-06-10 MED ORDER — SODIUM POLYSTYRENE SULFONATE 15 GM/60ML PO SUSP: 15.0000 g | Freq: Once | ORAL | 0 refills | Status: AC

## 2019-06-13 ENCOUNTER — Other Ambulatory Visit: Payer: Self-pay | Admitting: *Deleted

## 2019-06-13 ENCOUNTER — Encounter: Payer: Self-pay | Admitting: Family Medicine

## 2019-06-13 ENCOUNTER — Telehealth: Payer: Self-pay | Admitting: *Deleted

## 2019-06-13 ENCOUNTER — Other Ambulatory Visit: Payer: Self-pay

## 2019-06-13 ENCOUNTER — Ambulatory Visit: Payer: Medicare Other | Admitting: Family Medicine

## 2019-06-13 ENCOUNTER — Ambulatory Visit (INDEPENDENT_AMBULATORY_CARE_PROVIDER_SITE_OTHER): Payer: Medicare Other | Admitting: Family Medicine

## 2019-06-13 VITALS — BP 140/82 | HR 88 | Temp 98.2°F | Resp 14 | Ht 64.0 in | Wt 177.0 lb

## 2019-06-13 DIAGNOSIS — N179 Acute kidney failure, unspecified: Secondary | ICD-10-CM | POA: Diagnosis not present

## 2019-06-13 DIAGNOSIS — E875 Hyperkalemia: Secondary | ICD-10-CM

## 2019-06-13 DIAGNOSIS — N184 Chronic kidney disease, stage 4 (severe): Secondary | ICD-10-CM | POA: Diagnosis not present

## 2019-06-13 LAB — BASIC METABOLIC PANEL WITH GFR
BUN/Creatinine Ratio: 13 (calc) (ref 6–22)
BUN: 43 mg/dL — ABNORMAL HIGH (ref 7–25)
CO2: 22 mmol/L (ref 20–32)
Calcium: 9.2 mg/dL (ref 8.6–10.4)
Chloride: 111 mmol/L — ABNORMAL HIGH (ref 98–110)
Creat: 3.21 mg/dL — ABNORMAL HIGH (ref 0.60–0.88)
GFR, Est African American: 14 mL/min/{1.73_m2} — ABNORMAL LOW (ref >=60)
GFR, Est Non African American: 12 mL/min/{1.73_m2} — ABNORMAL LOW (ref >=60)
Glucose, Bld: 120 mg/dL — ABNORMAL HIGH (ref 65–99)
Potassium: 5 mmol/L (ref 3.5–5.3)
Sodium: 141 mmol/L (ref 135–146)

## 2019-06-13 NOTE — Telephone Encounter (Signed)
Per PCP, patient requires OV for evaluation.    Call placed to patient and patient daughter [name] made aware. States that patient other daughter Remo Lipps will bring to office.

## 2019-06-13 NOTE — Progress Notes (Signed)
   Subjective:    Patient ID: Deanna KRIZAN, female    DOB: [birth year], 86 y.o.   MRN: [id number]  Patient presents for Follow-up (hyperkalemia, AKI)  Patient here for interim follow-up on her acute on chronic renal disease and hyperkalemia. She was seen by my partner last week as she was having myoclonic jerks.  Initially she can be started on propanolol , he took 1 pill before her labs came back showing elevated  creatinineat 3.4 this is higher than her last at 2.91 a month ago.  Her potassium was also elevated at 5.8 her last potassium was 4.8.  She was given Kayexalate she stopped her potassium.  She is following with cardiology as well as nephrology.  She was also seen by urology secondary to kidney stones. She also had urine culture done last week which was negative.  CBC was stable with a hemoglobin around 9 which is her baseline.    Today she says she feels good.  Her appetite is still fair per her daughter.  But she has not had any further shaking after about 24 hours.  She took COVID Vaccine last week as well    Review Of Systems:  GEN- denies fatigue, fever, weight loss,weakness, recent illness HEENT- denies eye drainage, change in vision, nasal discharge, CVS- denies chest pain, palpitations RESP- denies SOB, cough, wheeze ABD- denies N/V, change in stools, abd pain GU- denies dysuria, hematuria, dribbling, incontinence MSK- denies joint pain, muscle aches, injury Neuro- denies headache, dizziness, syncope, seizure activity       Objective:    BP 140/82   Pulse 88   Temp 98.2 F (36.8 C) (Temporal)   Resp 14   Ht 5\' 4"  (1.626 m)   Wt 177 lb (80.3 kg)   SpO2 96%   BMI 30.38 kg/m  GEN- NAD, alert and oriented x3y Neck- Supple, no thyromegaly, no JVD CVS- RRR, no murmur RESP-CTAB ABD-NABS,soft,NT,ND EXT- chronic venous stais changes , mild edema non pitting  Pulses- Radial 2+, DP- 1+,  walks with assistance of walker Neuro-CNII-XII grossly in tact, no jerks  or shaking noted          Assessment & Plan:      Problem List Items Addressed This Visit    None    Visit Diagnoses    Acute renal failure superimposed on stage 4 chronic kidney disease, unspecified acute renal failure type (HCC)    -  Primary   Myoclonic jerks resolved, concern due to electrolyte problem as well as her renal failure.  She is now back to her baseline we will check a stat metabolic panel today.  She will remain off of the potassium as well.  She is already followed by neuro neurology so she is back at her baseline with regards to her creatinine I will not make any other changes today.    Relevant Orders   BASIC METABOLIC PANEL WITH GFR   Hyperkalemia       Relevant Orders   BASIC METABOLIC PANEL WITH GFR      Note: This dictation was prepared with Dragon dictation along with smaller phrase technology. Any transcriptional errors that result from this process are unintentional.

## 2019-06-13 NOTE — Patient Instructions (Addendum)
We will call with lab results F/U 4 months  

## 2019-06-13 NOTE — Telephone Encounter (Signed)
Call placed to patient daughter [name] to F/U.   Reports that appointment today cancelled as patient appears to be back to baseline. She has not noted any further tremors or "shaking". Reports that urine remains dark, but patient voices no C/O pain.   Advised that patient requires labs to re-check K+ level. States that she can bring patient in for lab draw after 2pm today.   MD to be made aware.

## 2019-06-20 ENCOUNTER — Other Ambulatory Visit: Payer: Self-pay

## 2019-06-20 ENCOUNTER — Other Ambulatory Visit: Payer: Medicare Other

## 2019-06-20 DIAGNOSIS — N179 Acute kidney failure, unspecified: Secondary | ICD-10-CM

## 2019-06-20 DIAGNOSIS — N184 Chronic kidney disease, stage 4 (severe): Secondary | ICD-10-CM | POA: Diagnosis not present

## 2019-06-20 LAB — BASIC METABOLIC PANEL
BUN/Creatinine Ratio: 14 (calc) (ref 6–22)
BUN: 47 mg/dL — ABNORMAL HIGH (ref 7–25)
CO2: 21 mmol/L (ref 20–32)
Calcium: 9.1 mg/dL (ref 8.6–10.4)
Chloride: 110 mmol/L (ref 98–110)
Creat: 3.27 mg/dL — ABNORMAL HIGH (ref 0.60–0.88)
Glucose, Bld: 92 mg/dL (ref 65–99)
Potassium: 4.6 mmol/L (ref 3.5–5.3)
Sodium: 142 mmol/L (ref 135–146)

## 2019-06-23 DIAGNOSIS — D631 Anemia in chronic kidney disease: Secondary | ICD-10-CM | POA: Diagnosis not present

## 2019-06-23 DIAGNOSIS — R809 Proteinuria, unspecified: Secondary | ICD-10-CM | POA: Diagnosis not present

## 2019-06-23 DIAGNOSIS — N189 Chronic kidney disease, unspecified: Secondary | ICD-10-CM | POA: Diagnosis not present

## 2019-06-23 DIAGNOSIS — N170 Acute kidney failure with tubular necrosis: Secondary | ICD-10-CM | POA: Diagnosis not present

## 2019-06-23 DIAGNOSIS — N184 Chronic kidney disease, stage 4 (severe): Secondary | ICD-10-CM | POA: Diagnosis not present

## 2019-06-24 ENCOUNTER — Emergency Department (HOSPITAL_COMMUNITY)
Admission: EM | Admit: 2019-06-24 | Discharge: 2019-06-24 | Disposition: A | Discharge status: Home / Self Care | Payer: Medicare Other | Attending: Emergency Medicine | Admitting: Emergency Medicine

## 2019-06-24 ENCOUNTER — Emergency Department (HOSPITAL_COMMUNITY): Payer: Medicare Other

## 2019-06-24 ENCOUNTER — Telehealth: Payer: Self-pay | Admitting: Family Medicine

## 2019-06-24 ENCOUNTER — Other Ambulatory Visit: Payer: Self-pay

## 2019-06-24 ENCOUNTER — Encounter (HOSPITAL_COMMUNITY): Payer: Self-pay | Admitting: *Deleted

## 2019-06-24 DIAGNOSIS — I130 Hypertensive heart and chronic kidney disease with heart failure and stage 1 through stage 4 chronic kidney disease, or unspecified chronic kidney disease: Secondary | ICD-10-CM | POA: Insufficient documentation

## 2019-06-24 DIAGNOSIS — Z87891 Personal history of nicotine dependence: Secondary | ICD-10-CM | POA: Insufficient documentation

## 2019-06-24 DIAGNOSIS — Z20822 Contact with and (suspected) exposure to covid-19: Secondary | ICD-10-CM | POA: Diagnosis not present

## 2019-06-24 DIAGNOSIS — R918 Other nonspecific abnormal finding of lung field: Secondary | ICD-10-CM | POA: Diagnosis not present

## 2019-06-24 DIAGNOSIS — R251 Tremor, unspecified: Secondary | ICD-10-CM | POA: Diagnosis not present

## 2019-06-24 DIAGNOSIS — Z79899 Other long term (current) drug therapy: Secondary | ICD-10-CM | POA: Diagnosis not present

## 2019-06-24 DIAGNOSIS — J189 Pneumonia, unspecified organism: Secondary | ICD-10-CM | POA: Diagnosis not present

## 2019-06-24 DIAGNOSIS — N183 Chronic kidney disease, stage 3 unspecified: Secondary | ICD-10-CM | POA: Diagnosis not present

## 2019-06-24 DIAGNOSIS — E1122 Type 2 diabetes mellitus with diabetic chronic kidney disease: Secondary | ICD-10-CM | POA: Diagnosis not present

## 2019-06-24 DIAGNOSIS — I5032 Chronic diastolic (congestive) heart failure: Secondary | ICD-10-CM | POA: Diagnosis not present

## 2019-06-24 DIAGNOSIS — I10 Essential (primary) hypertension: Secondary | ICD-10-CM | POA: Diagnosis not present

## 2019-06-24 LAB — CBC WITH DIFFERENTIAL/PLATELET
Abs Immature Granulocytes: 0.03 10*3/uL (ref 0.00–0.07)
Basophils Absolute: 0 10*3/uL (ref 0.0–0.1)
Basophils Relative: 0 %
Eosinophils Absolute: 0.2 10*3/uL (ref 0.0–0.5)
Eosinophils Relative: 3 %
HCT: 26.8 % — ABNORMAL LOW (ref 36.0–46.0)
Hemoglobin: 8 g/dL — ABNORMAL LOW (ref 12.0–15.0)
Immature Granulocytes: 0 %
Lymphocytes Relative: 10 %
Lymphs Abs: 0.9 10*3/uL (ref 0.7–4.0)
MCH: 29.7 pg (ref 26.0–34.0)
MCHC: 29.9 g/dL — ABNORMAL LOW (ref 30.0–36.0)
MCV: 99.6 fL (ref 80.0–100.0)
Monocytes Absolute: 0.4 10*3/uL (ref 0.1–1.0)
Monocytes Relative: 5 %
Neutro Abs: 6.7 10*3/uL (ref 1.7–7.7)
Neutrophils Relative %: 82 %
Platelets: 243 10*3/uL (ref 150–400)
RBC: 2.69 MIL/uL — ABNORMAL LOW (ref 3.87–5.11)
RDW: 17 % — ABNORMAL HIGH (ref 11.5–15.5)
WBC: 8.3 10*3/uL (ref 4.0–10.5)
nRBC: 0 % (ref 0.0–0.2)

## 2019-06-24 LAB — URINALYSIS, ROUTINE W REFLEX MICROSCOPIC
Bilirubin Urine: NEGATIVE
Glucose, UA: NEGATIVE mg/dL
Ketones, ur: NEGATIVE mg/dL
Leukocytes,Ua: NEGATIVE
Nitrite: NEGATIVE
Protein, ur: >=300 mg/dL — AB
RBC / HPF: >50 RBC/hpf — ABNORMAL HIGH (ref 0–5)
Specific Gravity, Urine: 1.013 (ref 1.005–1.030)
pH: 6 (ref 5.0–8.0)

## 2019-06-24 LAB — BASIC METABOLIC PANEL
Anion gap: 8 (ref 5–15)
BUN: 44 mg/dL — ABNORMAL HIGH (ref 8–23)
CO2: 22 mmol/L (ref 22–32)
Calcium: 8.9 mg/dL (ref 8.9–10.3)
Chloride: 114 mmol/L — ABNORMAL HIGH (ref 98–111)
Creatinine, Ser: 3.07 mg/dL — ABNORMAL HIGH (ref 0.44–1.00)
GFR calc Af Amer: 14 mL/min — ABNORMAL LOW (ref >60)
GFR calc non Af Amer: 13 mL/min — ABNORMAL LOW (ref >60)
Glucose, Bld: 148 mg/dL — ABNORMAL HIGH (ref 70–99)
Potassium: 4.4 mmol/L (ref 3.5–5.1)
Sodium: 144 mmol/L (ref 135–145)

## 2019-06-24 LAB — SARS CORONAVIRUS 2 (TAT 6-24 HRS): SARS Coronavirus 2: NEGATIVE

## 2019-06-24 MED ORDER — LEVOFLOXACIN 500 MG PO TABS: 500.0000 mg | ORAL_TABLET | Freq: Every day | ORAL | 0 refills | Status: DC

## 2019-06-24 MED ORDER — LEVOFLOXACIN 500 MG PO TABS
500.0000 mg | ORAL_TABLET | Freq: Once | ORAL | Status: AC
  Administered 2019-06-24: 19:00:00 500 mg via ORAL
  Filled 2019-06-24: qty 1

## 2019-06-24 NOTE — ED Provider Notes (Signed)
[facility] EMERGENCY DEPARTMENT Provider Note   CSN: [id number] Arrival date & time: 06/24/19  1323     History Chief Complaint  Patient presents with  . [place] is a 86 y.o. female.  Patient is a 86 year old female with history of diabetes, diabetic neuropathy, CHF, GERD.  She presents today for evaluation of shaking episodes.  She reports intermittent episodes where she begins to shake all over.  This lasts for approximately 1 or 2 minutes, then resolves.  She does not lose consciousness.  She denies other symptoms such as cough, dysuria, or fever.  She reports a similar episode 1 month ago which occurred, then resolved.  She was seen by her doctor yesterday and was told she had blood in her urine.  She was given a medication for her kidneys.  The history is provided by the patient.       Past Medical History:  Diagnosis Date  . Arthritis   . Bilateral carpal tunnel syndrome   . Cataracts, both eyes   . Chronic diastolic CHF (congestive heart failure) ([place])    a. Echo (09/2012): Mild LVH, EF 65-70%, moderately elevated RVSP, moderate TR, mild to moderate MR, mild LAE, grade 2 diastolic dysfunction  . Diabetes mellitus ORAL MED  . Diabetic neuropathy (HCC) both hands and legs  . GERD (gastroesophageal reflux disease)   . Glaucoma   . Gout   . History of echocardiogram    Echo 1/19:  EF 65-70, no RWMA, Gr 2 DD, mild MR, severe LAE, PASP 62   . History of falling RECENT FALL 1 WK AGO--   NO INJURY  . 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 History of transfusion   . Hyperlipidemia   . Hypertension   . Mitral regurgitation   . PUD (peptic ulcer disease)    many yrs ago  . Right leg weakness   . Rotator cuff tear    left  . Slipped intervertebral disc   . Walker as ambulation aid     Patient Active Problem List   Diagnosis Date Noted  . Kidney stones 05/18/2019  . Gross hematuria 05/18/2019  . OA (osteoarthritis) 04/18/2018    . Anemia 04/16/2018  . Senile purpura ([place]) 11/30/2017  . Dysphagia 07/28/2016  . Peripheral edema 07/02/2016  . CKD (chronic kidney disease), stage III 05/30/2015  . GERD (gastroesophageal reflux disease) 05/30/2015  . Peripheral neuropathy 03/19/2015  . Diabetes mellitus, type II ([place]) 02/05/2015  . Spinal stenosis of lumbar region 02/05/2015  . Chronic diastolic CHF (congestive heart failure) ([place]) 05/01/2014  . Essential hypertension 05/01/2014  . Hyperlipidemia 05/01/2014    Past Surgical History:  Procedure Laterality Date  . ABDOMINAL HYSTERECTOMY  1966  . APPENDECTOMY    . CARDIOVASCULAR STRESS TEST  06-18-2005   DR Daneen Schick   NORMAL STUDY/ NO EVIDENCE ISCHEMIA/ EF 75%  . CARPAL TUNNEL RELEASE  09/17/2011   Procedure: CARPAL TUNNEL RELEASE;  Surgeon: Magnus Sinning, MD;  Location: [place];  Service: Orthopedics;  Laterality: Right;  . CARPAL TUNNEL RELEASE  10/22/2011   Procedure: CARPAL TUNNEL RELEASE;  Surgeon: Magnus Sinning, MD;  Location: [place];  Service: Orthopedics;  Laterality: Left;  . EYE SURGERY    . LUMBAR LAMINECTOMY/DECOMPRESSION MICRODISCECTOMY  01/14/2012   Procedure: LUMBAR LAMINECTOMY/DECOMPRESSION MICRODISCECTOMY 3 LEVELS;  Surgeon: Magnus Sinning, MD;  Location: [facility];  Service:  Orthopedics;  Laterality: N/A;  Decompression Laminectomy of L2 - L3, L3 - L4 and L4 - L5 Central  (X-Ray)  . SHOULDER OPEN ROTATOR CUFF REPAIR  06/02/2012   Procedure: ROTATOR CUFF REPAIR SHOULDER OPEN;  Surgeon: Magnus Sinning, MD;  Location: [facility];  Service: Orthopedics;  Laterality: Left;  Left Shoulder Open Distal Clavicle Resection Anterior Acrominectomy and Rotator Cuff Repair  . SPINE SURGERY    . TRANSTHORACIC ECHOCARDIOGRAM  12-26-2008  DR Daneen Schick   NORMAL LVF/  EF  71%/   MILD ASYMMETRIC SEPTAL HYPERTROPHY/ MILD LEFT ATRIAL ENLARGEMENT/ MODERATELY ELEVATED ESTIMATED RIGHT VENTRICULAR SYSTOLIC PRESSURE/ MILD MITRAL   &  TRICUSPID VALVE REGURG.     OB History   No obstetric history on file.     Family History  Problem Relation Age of Onset  . Hypertension Father   . Hypertension Mother   . Stroke Mother   . Diabetes Sister   . Hypertension Sister   . Cancer Sister   . Arthritis Sister   . Hypertension Brother   . Alcohol abuse Brother   . Hypertension Sister   . Arthritis Sister   . Hypertension Sister   . Arthritis Sister   . Arthritis Sister   . Arthritis Sister   . Heart attack Son   . Colon cancer Neg Hx     Social History   Tobacco Use  . Smoking status: Former Smoker    Packs/day: 1.00    Years: 30.00    Pack years: 30.00    Types: Cigarettes    Quit date: 05/28/1968    Years since quitting: 51.1  . Smokeless tobacco: Never Used  Substance Use Topics  . Alcohol use: No  . Drug use: No    Home Medications Prior to Admission medications   Medication Sig Start Date End Date Taking? Authorizing Provider  acetaminophen (TYLENOL) 500 MG tablet Take 1,500 mg by mouth every 6 (six) hours as needed for mild pain.    [provider]  albuterol (PROVENTIL HFA;VENTOLIN HFA) 108 (90 Base) MCG/ACT inhaler Inhale 2 puffs into the lungs every 6 (six) hours as needed for wheezing or shortness of breath. 08/26/18   Alycia Rossetti, MD  allopurinol (ZYLOPRIM) 300 MG tablet TAKE 1 TABLET BY MOUTH  DAILY WITH BREAKFAST 01/28/19   [place], Modena Nunnery, MD  amLODipine (NORVASC) 5 MG tablet TAKE 1 TABLET BY MOUTH  DAILY 06/09/19   [place], Modena Nunnery, MD  azelastine (ASTELIN) 0.1 % nasal spray Place 1 spray into both nostrils 2 (two) times daily. Use in each nostril as directed 05/02/19   Alycia Rossetti, MD  diclofenac sodium (VOLTAREN) 1 % GEL Apply 2 g topically 4 (four) times daily. 04/16/18   Alycia Rossetti, MD  dorzolamide-timolol (COSOPT) 22.3-6.8 MG/ML ophthalmic solution Place 1 drop into the right eye 2 (two) times daily.    [provider]  furosemide (LASIX) 40 MG  tablet TAKE ONE-HALF TABLET BY  MOUTH DAILY 01/28/19   Alycia Rossetti, MD  gabapentin (NEURONTIN) 100 MG capsule TAKE 1 CAPSULE BY MOUTH AT  BEDTIME 10/11/18   [place], Modena Nunnery, MD  hydrALAZINE (APRESOLINE) 10 MG tablet TAKE 1 TABLET BY MOUTH  TWICE DAILY 06/09/19   [place], Modena Nunnery, MD  hydrocortisone (ANUSOL-HC) 25 MG suppository Place 1 suppository (25 mg total) rectally 2 (two) times daily. 02/23/18   Susy Frizzle, MD  omeprazole (PRILOSEC) 40 MG capsule TAKE 1 CAPSULE BY MOUTH  DAILY FOR  REFLUX 01/28/19   [place], Modena Nunnery, MD  pravastatin (PRAVACHOL) 20 MG tablet TAKE 1 TABLET BY MOUTH IN  THE EVENING 06/10/19   [place], Modena Nunnery, MD  timolol (BETIMOL) 0.5 % ophthalmic solution Place 1 drop into the left eye daily.    [provider]    Allergies    Patient has no known allergies.  Review of Systems   Review of Systems  All other systems reviewed and are negative.   Physical Exam Updated Vital Signs BP 125/74   Pulse 71   Temp 97.7 F (36.5 C) (Oral)   Resp 19   Ht 5\' 4"  (1.626 m)   Wt 80.2 kg   SpO2 95%   BMI 30.35 kg/m   Physical Exam Vitals and nursing note reviewed.  Constitutional:      General: She is not in acute distress.    Appearance: She is well-developed. She is not diaphoretic.  HENT:     Head: Normocephalic and atraumatic.  Eyes:     Extraocular Movements: Extraocular movements intact.     Pupils: Pupils are equal, round, and reactive to light.  Cardiovascular:     Rate and Rhythm: Normal rate and regular rhythm.     Heart sounds: No murmur. No friction rub. No gallop.   Pulmonary:     Effort: Pulmonary effort is normal. No respiratory distress.     Breath sounds: Normal breath sounds. No wheezing.  Abdominal:     General: Bowel sounds are normal. There is no distension.     Palpations: Abdomen is soft.     Tenderness: There is no abdominal tenderness.  Musculoskeletal:        General: Normal range of motion.     Cervical back: Normal  range of motion and neck supple.  Skin:    General: Skin is warm and dry.  Neurological:     General: No focal deficit present.     Mental Status: She is alert and oriented to person, place, and time.     Cranial Nerves: No cranial nerve deficit.     Sensory: No sensory deficit.     Motor: No weakness.     Coordination: Coordination normal.     ED Results / Procedures / Treatments   Labs (all labs ordered are listed, but only abnormal results are displayed) Labs Reviewed  BASIC METABOLIC PANEL  CBC WITH DIFFERENTIAL/PLATELET  URINALYSIS, ROUTINE W REFLEX MICROSCOPIC    EKG None  Radiology No results found.  Procedures Procedures (including critical care time)  Medications Ordered in ED Medications - No data to display  ED Course  I have reviewed the triage vital signs and the nursing notes.  Pertinent labs & imaging results that were available during my care of the patient were reviewed by me and considered in my medical decision making (see chart for details).    MDM Rules/Calculators/A&P  Patient presents here with complaints of episodic shaking.  She had an episode last month, then again today.  She was seen yesterday by her urologist and was told she had blood in her urine.    Patient is neurologically intact and appears clinically well.  Her vitals are stable.  Her work-up shows baseline renal insufficiency, but no other significant laboratory abnormalities.  Chest x-ray reporting mild diffuse infiltrates.  Patient does deny cough or shortness of breath, but will be treated with Levaquin.  Covid test will be obtained, but patient appears appropriate for discharge.  Final Clinical Impression(s) /  ED Diagnoses Final diagnoses:  None    Rx / DC Orders ED Discharge Orders    None       Veryl Speak, MD 06/24/19 [phone]

## 2019-06-24 NOTE — Telephone Encounter (Signed)
Patient's daughter called in stating that patient is having myoclonic jerks again and has been seeming more confused lately and "talking out of her head" States that she seen the Nephrology on yesterday and they noted some blood in urine. Wants to know what you recommend. Advised to take to ER however she wanted to know what you recommend she do. Please advise

## 2019-06-24 NOTE — Discharge Instructions (Signed)
Begin taking Levaquin as prescribed.  Continue other medications as previously prescribed.  Return to the emergency department if you develop chest pain, difficulty breathing, high fever, or other new and concerning symptoms.

## 2019-06-24 NOTE — Telephone Encounter (Signed)
Since she has recurrent jerking, and confused she need to be evaluated in the ER, because her kidney function has not been improving Last time her potassium was very high Send to ER

## 2019-06-24 NOTE — Telephone Encounter (Signed)
Call placed to patient and patient daughter [name] made aware.

## 2019-06-24 NOTE — ED Triage Notes (Signed)
Pt sent to er by Dr Buelah Manis due to "shaking" episodes at home and increase of confusion, pt was seen by dr yesterday and noted to have blood in urine, family concerned that pt may have uti, pt alert, able to answer questions in triage

## 2019-06-28 ENCOUNTER — Other Ambulatory Visit: Payer: Self-pay

## 2019-06-28 ENCOUNTER — Encounter: Payer: Self-pay | Admitting: Family Medicine

## 2019-06-28 ENCOUNTER — Ambulatory Visit: Payer: Medicare Other | Admitting: Urology

## 2019-06-28 ENCOUNTER — Ambulatory Visit (HOSPITAL_COMMUNITY)
Admission: RE | Admit: 2019-06-28 | Discharge: 2019-06-28 | Disposition: A | Discharge status: Home / Self Care | Payer: Medicare Other | Source: Ambulatory Visit | Attending: Family Medicine | Admitting: Family Medicine

## 2019-06-28 ENCOUNTER — Ambulatory Visit (INDEPENDENT_AMBULATORY_CARE_PROVIDER_SITE_OTHER): Payer: Medicare Other | Admitting: Family Medicine

## 2019-06-28 VITALS — BP 138/84 | HR 74 | Temp 97.8°F | Resp 18 | Ht 64.0 in | Wt 177.0 lb

## 2019-06-28 DIAGNOSIS — R4182 Altered mental status, unspecified: Secondary | ICD-10-CM | POA: Insufficient documentation

## 2019-06-28 DIAGNOSIS — N184 Chronic kidney disease, stage 4 (severe): Secondary | ICD-10-CM

## 2019-06-28 DIAGNOSIS — M48061 Spinal stenosis, lumbar region without neurogenic claudication: Secondary | ICD-10-CM | POA: Diagnosis not present

## 2019-06-28 DIAGNOSIS — R2681 Unsteadiness on feet: Secondary | ICD-10-CM

## 2019-06-28 DIAGNOSIS — J189 Pneumonia, unspecified organism: Secondary | ICD-10-CM | POA: Diagnosis not present

## 2019-06-28 DIAGNOSIS — I5032 Chronic diastolic (congestive) heart failure: Secondary | ICD-10-CM | POA: Diagnosis not present

## 2019-06-28 DIAGNOSIS — R519 Headache, unspecified: Secondary | ICD-10-CM | POA: Diagnosis not present

## 2019-06-28 LAB — CBC WITH DIFFERENTIAL/PLATELET
Absolute Monocytes: 324 cells/uL (ref 200–950)
Basophils Absolute: 16 cells/uL (ref 0–200)
Basophils Relative: 0.2 %
Eosinophils Absolute: 154 cells/uL (ref 15–500)
Eosinophils Relative: 1.9 %
HCT: 26.9 % — ABNORMAL LOW (ref 35.0–45.0)
Hemoglobin: 8.5 g/dL — ABNORMAL LOW (ref 11.7–15.5)
Lymphs Abs: 899 cells/uL (ref 850–3900)
MCH: 29.3 pg (ref 27.0–33.0)
MCHC: 31.6 g/dL — ABNORMAL LOW (ref 32.0–36.0)
MCV: 92.8 fL (ref 80.0–100.0)
MPV: 9.8 fL (ref 7.5–12.5)
Monocytes Relative: 4 %
Neutro Abs: 6707 cells/uL (ref 1500–7800)
Neutrophils Relative %: 82.8 %
Platelets: 261 10*3/uL (ref 140–400)
RBC: 2.9 10*6/uL — ABNORMAL LOW (ref 3.80–5.10)
RDW: 16.4 % — ABNORMAL HIGH (ref 11.0–15.0)
Total Lymphocyte: 11.1 %
WBC: 8.1 10*3/uL (ref 3.8–10.8)

## 2019-06-28 LAB — COMPREHENSIVE METABOLIC PANEL
AG Ratio: 1.5 (calc) (ref 1.0–2.5)
ALT: 5 U/L — ABNORMAL LOW (ref 6–29)
AST: 12 U/L (ref 10–35)
Albumin: 4.1 g/dL (ref 3.6–5.1)
Alkaline phosphatase (APISO): 142 U/L (ref 37–153)
BUN/Creatinine Ratio: 12 (calc) (ref 6–22)
BUN: 50 mg/dL — ABNORMAL HIGH (ref 7–25)
CO2: 25 mmol/L (ref 20–32)
Calcium: 9.5 mg/dL (ref 8.6–10.4)
Chloride: 109 mmol/L (ref 98–110)
Creat: 4.05 mg/dL — ABNORMAL HIGH (ref 0.60–0.88)
Globulin: 2.7 g/dL (calc) (ref 1.9–3.7)
Glucose, Bld: 132 mg/dL — ABNORMAL HIGH (ref 65–99)
Potassium: 4.8 mmol/L (ref 3.5–5.3)
Sodium: 144 mmol/L (ref 135–146)
Total Bilirubin: 0.3 mg/dL (ref 0.2–1.2)
Total Protein: 6.8 g/dL (ref 6.1–8.1)

## 2019-06-28 LAB — AMMONIA: Ammonia: 30 umol/L (ref <=72)

## 2019-06-28 LAB — BRAIN NATRIURETIC PEPTIDE: Brain Natriuretic Peptide: 428 pg/mL — ABNORMAL HIGH (ref <100)

## 2019-06-28 NOTE — Patient Instructions (Signed)
Continue the lasix  COntinue antibiotics  CT to be done

## 2019-06-28 NOTE — Progress Notes (Addendum)
Subjective:    Patient ID: [name], female    DOB: [birth year], 86 y.o.   MRN: [id number]  Patient presents for ER F/U (PNA- increased confusion/ visual hallucinations- HA in back of head) Here with her daughter.  She continues to have episodes of altered mental status where she is hallucinating.  This is been going on for the past 2 weeks on and off.  The past few days they gave me some examples of her seeing dogs that were not there.  Seeing people in her room including her sisters especially at bedtime.  Her daughter states that they were up all night because she would not sleep.  She seems to get nervous and starts shaking at times.  Other times she feels things like her normal self.  She was having some of the symptoms even before she was diagnosed with pneumonia when she was seen in the ER a couple of days ago.  With regard to the shaking episode we sent her back to the ER secondary to that to see if she was experiencing worsening renal function which her creatinine is already at a 3 but that had actually improved from her last check.  She also had hyperkalemia in the past when she was shaking significantly but her potassium was normal.  I did have her hold her water pill for a couple of days her creatinine did go down from 3.27 > 3.07 but then she started noticing she was more short of breath so she had to restart her Lasix at 20 mg. She was seen by her nephrologist last week as well the day before she went to the hospital there started her on sodium bicarb because of her acidosis they are planning for a renal biopsy.   Regard to the ER visit I did review this in detail.  She did chest x-ray that showed bilateral infiltrates even though she was not displaying the classic symptoms of pneumonia with the exception of the altered mental status and hallucinations.  She is currently on Levaquin.  Her daughter states that maybe 1 day she had some wheezing.  She has not had any fever.  Her breathing  is improved since she has been back on her Lasix the past 2 days.    Review Of Systems: Per above  GEN- denies fatigue, fever, weight loss,weakness, recent illness HEENT- denies eye drainage, change in vision, nasal discharge, CVS- denies chest pain, palpitations RESP- +SOB, cough, wheeze ABD- denies N/V, change in stools, abd pain GU- denies dysuria, hematuria, dribbling, incontinence MSK- denies joint pain, muscle aches, injury Neuro- denies headache, dizziness, syncope, seizure activity       Objective:    BP 138/84   Pulse 74   Temp 97.8 F (36.6 C) (Temporal)   Resp 18   Ht 5\' 4"  (1.626 m)   Wt 177 lb (80.3 kg)   SpO2 100%   BMI 30.38 kg/m  GEN- NAD, alert and oriented x3 , able to recall details of her nephrology visit on her own able to recall her medications without them being present Neck- Supple, no thyromegaly, no JVD CVS- RRR, no murmur RESP-CTAB ABD-NABS,soft,NT,ND EXT- chronic venous stais changes , mild edema non pitting  Pulses- Radial 2+, DP- 1+,  walks with assistance of walker Neuro-CNII-XII grossly in tact, no jerks or shaking noted       Assessment & Plan:      Problem List Items Addressed This Visit      Unprioritized  Chronic diastolic CHF (congestive heart failure) ([place])    Very complicated presentation.  While she is having these hallucinations even before walking into the room her daughter states that she was stating there was blood on the floor but there was nothing there.  She is not agitated all she is very pleasant.  She is able to recall multiple details about her medical history and recent events and she does not recall seeing these people and described them to me.  There have been no recent medication changes prior to her episodes happening.  She was having the before she was started on bicarb as well as the vitamin D by nephrology from last Thursday.  She also had these before she was started on the Levaquin on Friday.  When I have  him complete antibiotics for the bilateral infiltrates.  Covid testing was negative.  Labs were actually improved when she was at the emergency room but I will recheck these today along with a BNP level and an ammonia level.  I'm going to  send her for stat CT of the head she has not had any falls that they are aware of but is possible she has had cerebrovascular event that may be contributing to the hallucinations.  We will also have her stop gabapentin just looking at her medication list in general.  We will see if her renal function is still around a 3-3-1/2 which is been where she has been sitting for the past 6 weeks or so.      Relevant Orders   Brain natriuretic peptide (Completed)   CKD (chronic kidney disease) stage 4, GFR 15-29 ml/min (HCC)   Gait instability   Spinal stenosis of lumbar region    Transfer wheelchair with  drop arm needed  for ease of transfers. This will help care givers within the home and with transporting to appointments          Other Visit Diagnoses    Altered mental status, unspecified altered mental status type    -  Primary   Relevant Orders   CT Head Wo Contrast (Completed)   CBC with Differential/Platelet (Completed)   Comprehensive metabolic panel (Completed)   Ammonia (Completed)   Urinalysis, Routine w reflex microscopic   Urine Culture   Community acquired pneumonia, unspecified laterality          Note: This dictation was prepared with Dragon dictation along with smaller phrase technology. Any transcriptional errors that result from this process are unintentional.

## 2019-06-28 NOTE — Assessment & Plan Note (Signed)
Very complicated presentation.  While she is having these hallucinations even before walking into the room her daughter states that she was stating there was blood on the floor but there was nothing there.  She is not agitated all she is very pleasant.  She is able to recall multiple details about her medical history and recent events and she does not recall seeing these people and described them to me.  There have been no recent medication changes prior to her episodes happening.  She was having the before she was started on bicarb as well as the vitamin D by nephrology from last Thursday.  She also had these before she was started on the Levaquin on Friday.  When I have him complete antibiotics for the bilateral infiltrates.  Covid testing was negative.  Labs were actually improved when she was at the emergency room but I will recheck these today along with a BNP level and an ammonia level.  I'm going to  send her for stat CT of the head she has not had any falls that they are aware of but is possible she has had cerebrovascular event that may be contributing to the hallucinations.  We will also have her stop gabapentin just looking at her medication list in general.  We will see if her renal function is still around a 3-3-1/2 which is been where she has been sitting for the past 6 weeks or so.

## 2019-06-29 ENCOUNTER — Other Ambulatory Visit (HOSPITAL_COMMUNITY): Payer: Self-pay | Admitting: Nephrology

## 2019-06-29 ENCOUNTER — Telehealth: Payer: Self-pay | Admitting: *Deleted

## 2019-06-29 DIAGNOSIS — N185 Chronic kidney disease, stage 5: Secondary | ICD-10-CM

## 2019-06-29 NOTE — Telephone Encounter (Signed)
Call placed to [facility] (now [facility]) to obtain fax number.   Reports that patient daughter [name] has discussed last night with nephrology. He agrees to stop Gabapentin, and he recommended starting Seroquel 25mg  PO QHS. Family agreeable to plan. Nephrology alos is scheduling biopsy of kidney since function has worsened.   MD to be made aware.

## 2019-06-29 NOTE — Telephone Encounter (Signed)
Noted  

## 2019-06-29 NOTE — Telephone Encounter (Signed)
Received call from patient daughter [name].   Reports that patient had a horrible night on 06/28/2019. Reports that patient was enraged and continued to hallucinate. States that she was throwing covers off of the bed and did not sleep at all.   Requested MD to advise.

## 2019-06-30 ENCOUNTER — Other Ambulatory Visit: Payer: Self-pay | Admitting: Radiology

## 2019-07-01 ENCOUNTER — Other Ambulatory Visit: Payer: Self-pay | Admitting: Student

## 2019-07-04 ENCOUNTER — Encounter (HOSPITAL_COMMUNITY): Payer: Self-pay

## 2019-07-04 ENCOUNTER — Telehealth: Payer: Self-pay | Admitting: Interventional Cardiology

## 2019-07-04 ENCOUNTER — Ambulatory Visit (HOSPITAL_COMMUNITY)
Admission: RE | Admit: 2019-07-04 | Discharge: 2019-07-04 | Disposition: A | Discharge status: Home / Self Care | Payer: Medicare Other | Source: Ambulatory Visit | Attending: Nephrology | Admitting: Nephrology

## 2019-07-04 ENCOUNTER — Other Ambulatory Visit: Payer: Self-pay

## 2019-07-04 DIAGNOSIS — N184 Chronic kidney disease, stage 4 (severe): Secondary | ICD-10-CM | POA: Diagnosis not present

## 2019-07-04 DIAGNOSIS — N289 Disorder of kidney and ureter, unspecified: Secondary | ICD-10-CM | POA: Diagnosis not present

## 2019-07-04 DIAGNOSIS — R0602 Shortness of breath: Secondary | ICD-10-CM | POA: Diagnosis not present

## 2019-07-04 DIAGNOSIS — Z9181 History of falling: Secondary | ICD-10-CM | POA: Diagnosis not present

## 2019-07-04 DIAGNOSIS — Z8249 Family history of ischemic heart disease and other diseases of the circulatory system: Secondary | ICD-10-CM | POA: Diagnosis not present

## 2019-07-04 DIAGNOSIS — S37019A Minor contusion of unspecified kidney, initial encounter: Secondary | ICD-10-CM | POA: Diagnosis not present

## 2019-07-04 DIAGNOSIS — Z20822 Contact with and (suspected) exposure to covid-19: Secondary | ICD-10-CM | POA: Diagnosis not present

## 2019-07-04 DIAGNOSIS — N185 Chronic kidney disease, stage 5: Secondary | ICD-10-CM

## 2019-07-04 DIAGNOSIS — I340 Nonrheumatic mitral (valve) insufficiency: Secondary | ICD-10-CM | POA: Diagnosis not present

## 2019-07-04 DIAGNOSIS — E785 Hyperlipidemia, unspecified: Secondary | ICD-10-CM | POA: Diagnosis not present

## 2019-07-04 DIAGNOSIS — Z8261 Family history of arthritis: Secondary | ICD-10-CM | POA: Diagnosis not present

## 2019-07-04 DIAGNOSIS — Z8711 Personal history of peptic ulcer disease: Secondary | ICD-10-CM | POA: Diagnosis not present

## 2019-07-04 DIAGNOSIS — I132 Hypertensive heart and chronic kidney disease with heart failure and with stage 5 chronic kidney disease, or end stage renal disease: Secondary | ICD-10-CM | POA: Diagnosis not present

## 2019-07-04 DIAGNOSIS — E1121 Type 2 diabetes mellitus with diabetic nephropathy: Secondary | ICD-10-CM | POA: Diagnosis not present

## 2019-07-04 DIAGNOSIS — E119 Type 2 diabetes mellitus without complications: Secondary | ICD-10-CM | POA: Diagnosis not present

## 2019-07-04 DIAGNOSIS — R310 Gross hematuria: Secondary | ICD-10-CM | POA: Diagnosis not present

## 2019-07-04 DIAGNOSIS — I10 Essential (primary) hypertension: Secondary | ICD-10-CM | POA: Diagnosis not present

## 2019-07-04 DIAGNOSIS — Z8673 Personal history of transient ischemic attack (TIA), and cerebral infarction without residual deficits: Secondary | ICD-10-CM | POA: Diagnosis not present

## 2019-07-04 DIAGNOSIS — H409 Unspecified glaucoma: Secondary | ICD-10-CM | POA: Diagnosis not present

## 2019-07-04 DIAGNOSIS — N132 Hydronephrosis with renal and ureteral calculous obstruction: Secondary | ICD-10-CM | POA: Diagnosis not present

## 2019-07-04 DIAGNOSIS — N1339 Other hydronephrosis: Secondary | ICD-10-CM | POA: Diagnosis not present

## 2019-07-04 DIAGNOSIS — E1142 Type 2 diabetes mellitus with diabetic polyneuropathy: Secondary | ICD-10-CM | POA: Diagnosis not present

## 2019-07-04 DIAGNOSIS — E1122 Type 2 diabetes mellitus with diabetic chronic kidney disease: Secondary | ICD-10-CM | POA: Diagnosis not present

## 2019-07-04 DIAGNOSIS — R109 Unspecified abdominal pain: Secondary | ICD-10-CM | POA: Diagnosis not present

## 2019-07-04 DIAGNOSIS — N99840 Postprocedural hematoma of a genitourinary system organ or structure following a genitourinary system procedure: Secondary | ICD-10-CM | POA: Diagnosis not present

## 2019-07-04 DIAGNOSIS — D62 Acute posthemorrhagic anemia: Secondary | ICD-10-CM | POA: Diagnosis not present

## 2019-07-04 DIAGNOSIS — Z79899 Other long term (current) drug therapy: Secondary | ICD-10-CM | POA: Diagnosis not present

## 2019-07-04 DIAGNOSIS — D649 Anemia, unspecified: Secondary | ICD-10-CM | POA: Diagnosis not present

## 2019-07-04 DIAGNOSIS — Z03818 Encounter for observation for suspected exposure to other biological agents ruled out: Secondary | ICD-10-CM | POA: Diagnosis not present

## 2019-07-04 DIAGNOSIS — M109 Gout, unspecified: Secondary | ICD-10-CM | POA: Diagnosis not present

## 2019-07-04 DIAGNOSIS — I5032 Chronic diastolic (congestive) heart failure: Secondary | ICD-10-CM | POA: Diagnosis not present

## 2019-07-04 DIAGNOSIS — K219 Gastro-esophageal reflux disease without esophagitis: Secondary | ICD-10-CM | POA: Diagnosis not present

## 2019-07-04 DIAGNOSIS — Z791 Long term (current) use of non-steroidal anti-inflammatories (NSAID): Secondary | ICD-10-CM | POA: Diagnosis not present

## 2019-07-04 DIAGNOSIS — R319 Hematuria, unspecified: Secondary | ICD-10-CM | POA: Diagnosis not present

## 2019-07-04 DIAGNOSIS — N179 Acute kidney failure, unspecified: Secondary | ICD-10-CM | POA: Diagnosis not present

## 2019-07-04 LAB — CBC
HCT: 28 % — ABNORMAL LOW (ref 36.0–46.0)
Hemoglobin: 8.5 g/dL — ABNORMAL LOW (ref 12.0–15.0)
MCH: 29.6 pg (ref 26.0–34.0)
MCHC: 30.4 g/dL (ref 30.0–36.0)
MCV: 97.6 fL (ref 80.0–100.0)
Platelets: 253 10*3/uL (ref 150–400)
RBC: 2.87 MIL/uL — ABNORMAL LOW (ref 3.87–5.11)
RDW: 16.8 % — ABNORMAL HIGH (ref 11.5–15.5)
WBC: 9.3 10*3/uL (ref 4.0–10.5)
nRBC: 0 % (ref 0.0–0.2)

## 2019-07-04 LAB — PROTIME-INR
INR: 1.1 (ref 0.8–1.2)
Prothrombin Time: 14 seconds (ref 11.4–15.2)

## 2019-07-04 LAB — GLUCOSE, CAPILLARY: Glucose-Capillary: 125 mg/dL — ABNORMAL HIGH (ref 70–99)

## 2019-07-04 MED ORDER — FENTANYL CITRATE (PF) 100 MCG/2ML IJ SOLN
INTRAMUSCULAR | Status: AC
  Filled 2019-07-04: qty 2

## 2019-07-04 MED ORDER — SODIUM CHLORIDE 0.9 % IV SOLN: INTRAVENOUS | Status: DC

## 2019-07-04 MED ORDER — FENTANYL CITRATE (PF) 100 MCG/2ML IJ SOLN
INTRAMUSCULAR | Status: AC | PRN
  Administered 2019-07-04: 25 ug via INTRAVENOUS

## 2019-07-04 MED ORDER — MIDAZOLAM HCL 2 MG/2ML IJ SOLN
INTRAMUSCULAR | Status: AC | PRN
  Administered 2019-07-04: 0.5 mg via INTRAVENOUS

## 2019-07-04 MED ORDER — LIDOCAINE-EPINEPHRINE 1 %-1:100000 IJ SOLN
INTRAMUSCULAR | Status: AC
  Filled 2019-07-04: qty 1

## 2019-07-04 MED ORDER — GELATIN ABSORBABLE 12-7 MM EX MISC
CUTANEOUS | Status: AC
  Filled 2019-07-04: qty 1

## 2019-07-04 MED ORDER — MIDAZOLAM HCL 2 MG/2ML IJ SOLN
INTRAMUSCULAR | Status: AC
  Filled 2019-07-04: qty 2

## 2019-07-04 NOTE — Procedures (Signed)
Pre Procedure Dx: Worsening renal function Post Procedural Dx: Same  Technically successful US guided biopsy of inferior pole of the left kidney  EBL: Minimal  Procedure complicated by development of a small perinephric hematoma.  Ronny Bacon, MD Pager #: [phone]

## 2019-07-04 NOTE — Telephone Encounter (Signed)
New message   Patient's daughter called and states that the appt on 07/20/19 at 8:00 am was cancelled in error and the patient will be getting out of the hospital today. The patient's daughter wants to know if the patient can be worked into the schedule this month. Please call the patient's daughter to discuss.

## 2019-07-04 NOTE — Progress Notes (Signed)
Pt voices need to urinate on arrival to unit.  Voided large blood clots with urine.  Jannifer Franklin, PA in to see patient and bedpan.  Dr. Pascal Lux notified, states he expected some bleeding.  IV started as ordered at 75/hr.  Pt denies pain at this time.

## 2019-07-04 NOTE — Progress Notes (Addendum)
Patient ID: [name], female   DOB: 09-Sep-1923, 86 y.o.   MRN: [id number]   Pt has urinated at least 3 more times Clots are less each time  Now just blood tinged-- all urine; no clots  Pt still no pain VSS  Will remain in observation for now

## 2019-07-04 NOTE — Discharge Instructions (Addendum)
Percutaneous Kidney Biopsy, Care After This sheet gives you information about how to care for yourself after your procedure. Your health care provider may also give you more specific instructions. If you have problems or questions, contact your health care provider. What can I expect after the procedure? After the procedure, it is common to have:  Pain or soreness near the biopsy site.  Pink or cloudy urine for 24 hours after the procedure. Follow these instructions at home: Activity  Return to your normal activities as told by your health care provider. Ask your health care provider what activities are safe for you.  If you were given a sedative during the procedure, it can affect you for several hours. Do not drive or operate machinery until your health care provider says that it is safe.  Do not lift anything that is heavier than 10 lb (4.5 kg), or the limit that you are told, until your health care provider says that it is safe.  Avoid activities that take a lot of effort (are strenuous) until your health care provider approves. Most people will have to wait 2 weeks before returning to activities such as exercise or sex. General instructions   Take over-the-counter and prescription medicines only as told by your health care provider.  You may eat and drink after your procedure. Follow instructions from your health care provider about eating or drinking restrictions.  Check your biopsy site every day for signs of infection. Check for: ? More redness, swelling, or pain. ? Fluid or blood. ? Warmth. ? Pus or a bad smell.  Keep all follow-up visits as told by your health care provider. This is important. Contact a health care provider if:  You have more redness, swelling, or pain around your biopsy site.  You have fluid or blood coming from your biopsy site.  Your biopsy site feels warm to the touch.  You have pus or a bad smell coming from your biopsy site.  You have blood  in your urine more than 24 hours after your procedure.  You have a fever. Get help right away if:  Your urine is dark red or brown.  You cannot urinate.  It burns when you urinate.  You feel dizzy or light-headed.  You have severe pain in your abdomen or side. Summary  After the procedure, it is common to have pain or soreness at the biopsy site and pink or cloudy urine for the first 24 hours.  Check your biopsy site each day for signs of infection, such as more redness, swelling, or pain; fluid, blood, pus or a bad smell coming from the biopsy site; or the biopsy site feeling warm to touch.  Return to your normal activities as told by your health care provider. This information is not intended to replace advice given to you by your health care provider. Make sure you discuss any questions you have with your health care provider. Document Revised: 01/13/2019 Document Reviewed: 01/13/2019 Elsevier Patient Education  2020 Elsevier Inc. Moderate Conscious Sedation, Adult Sedation is the use of medicines to promote relaxation and relieve discomfort and anxiety. Moderate conscious sedation is a type of sedation. Under moderate conscious sedation, you are less alert than normal, but you are still able to respond to instructions, touch, or both. Moderate conscious sedation is used during short medical and dental procedures. It is milder than deep sedation, which is a type of sedation under which you cannot be easily woken up. It is also milder than   general anesthesia, which is the use of medicines to make you unconscious. Moderate conscious sedation allows you to return to your regular activities sooner. Tell a health care provider about:  Any allergies you have.  All medicines you are taking, including vitamins, herbs, eye drops, creams, and over-the-counter medicines.  Use of steroids (by mouth or creams).  Any problems you or family members have had with sedatives and anesthetic  medicines.  Any blood disorders you have.  Any surgeries you have had.  Any medical conditions you have, such as sleep apnea.  Whether you are pregnant or may be pregnant.  Any use of cigarettes, alcohol, marijuana, or street drugs. What are the risks? Generally, this is a safe procedure. However, problems may occur, including:  Getting too much medicine (oversedation).  Nausea.  Allergic reaction to medicines.  Trouble breathing. If this happens, a breathing tube may be used to help with breathing. It will be removed when you are awake and breathing on your own.  Heart trouble.  Lung trouble. What happens before the procedure? Staying hydrated Follow instructions from your health care provider about hydration, which may include:  Up to 2 hours before the procedure - you may continue to drink clear liquids, such as water, clear fruit juice, black coffee, and plain tea. Eating and drinking restrictions Follow instructions from your health care provider about eating and drinking, which may include:  8 hours before the procedure - stop eating heavy meals or foods such as meat, fried foods, or fatty foods.  6 hours before the procedure - stop eating light meals or foods, such as toast or cereal.  6 hours before the procedure - stop drinking milk or drinks that contain milk.  2 hours before the procedure - stop drinking clear liquids. Medicine Ask your health care provider about:  Changing or stopping your regular medicines. This is especially important if you are taking diabetes medicines or blood thinners.  Taking medicines such as aspirin and ibuprofen. These medicines can thin your blood. Do not take these medicines before your procedure if your health care provider instructs you not to.  Tests and exams  You will have a physical exam.  You may have blood tests done to show: ? How well your kidneys and liver are working. ? How well your blood can clot. General  instructions  Plan to have someone take you home from the hospital or clinic.  If you will be going home right after the procedure, plan to have someone with you for 24 hours. What happens during the procedure?  An IV tube will be inserted into one of your veins.  Medicine to help you relax (sedative) will be given through the IV tube.  The medical or dental procedure will be performed. What happens after the procedure?  Your blood pressure, heart rate, breathing rate, and blood oxygen level will be monitored often until the medicines you were given have worn off.  Do not drive for 24 hours. This information is not intended to replace advice given to you by your health care provider. Make sure you discuss any questions you have with your health care provider. Document Revised: 04/24/2017 Document Reviewed: 09/01/2015 Elsevier Patient Education  2020 Elsevier Inc.  

## 2019-07-04 NOTE — Progress Notes (Signed)
Referring Physician(s): Bhutani,Manpreet S  Supervising Physician: Sandi Mariscal  Patient Status:  [facility] OP  Chief Complaint:  Hematuria after random renal biopsy  Subjective:  Called to room by RN  to visualize blood in bedpan after urination Large amount of clots in bedpan Almost all blood Pt does not complain of pain VSS  Spoke to Dr Pascal Lux re pt.   Allergies: Patient has no known allergies.  Medications: Prior to Admission medications   Medication Sig Start Date End Date Taking? Authorizing Provider  allopurinol (ZYLOPRIM) 300 MG tablet TAKE 1 TABLET BY MOUTH  DAILY WITH BREAKFAST 01/28/19  Yes [place], Modena Nunnery, MD  amLODipine (NORVASC) 5 MG tablet TAKE 1 TABLET BY MOUTH  DAILY 06/09/19  Yes [place], Modena Nunnery, MD  azelastine (ASTELIN) 0.1 % nasal spray Place 1 spray into both nostrils 2 (two) times daily. Use in each nostril as directed 05/02/19  Yes [place], Modena Nunnery, MD  diclofenac sodium (VOLTAREN) 1 % GEL Apply 2 g topically 4 (four) times daily. 04/16/18  Yes [place], Modena Nunnery, MD  dorzolamide-timolol (COSOPT) 22.3-6.8 MG/ML ophthalmic solution Place 1 drop into the right eye 2 (two) times daily.   Yes [provider]  furosemide (LASIX) 40 MG tablet TAKE ONE-HALF TABLET BY  MOUTH DAILY 01/28/19  Yes [place], Modena Nunnery, MD  hydrALAZINE (APRESOLINE) 10 MG tablet TAKE 1 TABLET BY MOUTH  TWICE DAILY 06/09/19  Yes [place], Modena Nunnery, MD  omeprazole (PRILOSEC) 40 MG capsule TAKE 1 CAPSULE BY MOUTH  DAILY FOR REFLUX Patient taking differently: Take 40 mg by mouth daily.  01/28/19  Yes , Modena Nunnery, MD  potassium chloride SA (KLOR-CON) 20 MEQ tablet Take 20 mEq by mouth daily. 06/20/19  Yes [provider]  pravastatin (PRAVACHOL) 20 MG tablet TAKE 1 TABLET BY MOUTH IN  THE EVENING 06/10/19  Yes , Modena Nunnery, MD  timolol (BETIMOL) 0.5 % ophthalmic solution Place 1 drop into the left eye daily.   Yes [provider]  Vitamin D, Ergocalciferol,  (DRISDOL) 1.25 MG (50000 UNIT) CAPS capsule Take 1 capsule by mouth once a week. 06/23/19  Yes [provider]  acetaminophen (TYLENOL) 500 MG tablet Take 1,500 mg by mouth every 6 (six) hours as needed for mild pain.    [provider]  albuterol (PROVENTIL HFA;VENTOLIN HFA) 108 (90 Base) MCG/ACT inhaler Inhale 2 puffs into the lungs every 6 (six) hours as needed for wheezing or shortness of breath. 08/26/18   , Modena Nunnery, MD  hydrocortisone (ANUSOL-HC) 25 MG suppository Place 1 suppository (25 mg total) rectally 2 (two) times daily. 02/23/18   Susy Frizzle, MD  levofloxacin (LEVAQUIN) 500 MG tablet Take 1 tablet (500 mg total) by mouth daily. 06/24/19   Veryl Speak, MD     Vital Signs: BP (!) 125/57   Pulse 78   Temp (!) 97.5 F (36.4 C) (Oral)   Resp 17   Ht 5\' 3"  (1.6 m)   Wt 186 lb (84.4 kg)   SpO2 97%   BMI 32.95 kg/m   Physical Exam Vitals reviewed.  Pulmonary:     Breath sounds: Normal breath sounds.  Genitourinary:    Comments: bedpan full of blood and large clot  Musculoskeletal:        General: Normal range of motion.  Skin:    General: Skin is warm.  Neurological:     Mental Status: She is alert and oriented to person, place, and time.  Imaging: No results found.  Labs:  CBC: Recent Labs    06/09/19 1128 06/24/19 1546 06/28/19 1149 07/04/19 0630  WBC 6.2 8.3 8.1 9.3  HGB 8.9* 8.0* 8.5* 8.5*  HCT 28.3* 26.8* 26.9* 28.0*  PLT 233 243 261 253    COAGS: Recent Labs    07/04/19 0630  INR 1.1    BMP: Recent Labs    05/02/19 1424 05/12/19 1635 06/09/19 1128 06/09/19 1128 06/13/19 1126 06/20/19 0853 06/24/19 1546 06/28/19 1149  NA   < > 135 141   < > 141 142 144 144  K   < > 4.8 5.8*   < > 5.0 4.6 4.4 4.8  CL   < > 106 114*   < > 111* 110 114* 109  CO2   < > 20* 19*   < > 22 21 22 25   GLUCOSE   < > 102* 106*   < > 120* 92 148* 132*  BUN   < > 43* 45*   < > 43* 47* 44* 50*  CALCIUM   < > 9.1 9.4   < > 9.2  9.1 8.9 9.5  CREATININE   < > 2.91* 3.41*   < > 3.21* 3.27* 3.07* 4.05*  GFRNONAA  --  13* 11*  --  12*  --  13*  --   GFRAA  --  15* 13*  --  14*  --  14*  --    < > = values in this interval not displayed.    LIVER FUNCTION TESTS: Recent Labs    11/17/18 0959 04/27/19 1126 06/09/19 1128 06/28/19 1149  BILITOT 0.3 0.3 0.3 0.3  AST 13 10 12 12   ALT 6 3* 7 5*  PROT 6.8 7.0 6.5 6.8    Assessment and Plan:  Will change fluids to 75 cc/hr. Continue npo for now Watch UOP-- if still bloody/clotty after few more urinations- Will need to consider admission Will discuss with Dr Theador Hawthorne Pt is aware   Electronically Signed: Lavonia Drafts, PA-C 07/04/2019, 9:19 AM   I spent a total of 15 Minutes at the the patient's bedside AND on the patient's hospital floor or unit, greater than 50% of which was counseling/coordinating care for hematuria after renal bx

## 2019-07-04 NOTE — H&P (Signed)
Chief Complaint: Patient was seen in consultation today for random renal biopsy at the request of [place] S  Referring Physician(s): [place] S  Supervising Physician: Sandi Mariscal  Patient Status: [facility] - Out-pt  History of Present Illness: [name] is a 86 y.o. female   Known CKD "for years" per pt PMD discovers worsening Creatinine Referral  to Dr Harriett Sine stage 5  Now scheduled for random renal biopsy   Past Medical History:  Diagnosis Date  . Arthritis   . Bilateral carpal tunnel syndrome   . Cataracts, both eyes   . Chronic diastolic CHF (congestive heart failure) ([place])    a. Echo (09/2012): Mild LVH, EF 65-70%, moderately elevated RVSP, moderate TR, mild to moderate MR, mild LAE, grade 2 diastolic dysfunction  . Diabetes mellitus ORAL MED  . Diabetic neuropathy (HCC) both hands and legs  . GERD (gastroesophageal reflux disease)   . Glaucoma   . Gout   . History of echocardiogram    Echo 1/19:  EF 65-70, no RWMA, Gr 2 DD, mild MR, severe LAE, PASP 62   . History of falling RECENT FALL 1 WK AGO--   NO INJURY  . 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 History of transfusion   . Hyperlipidemia   . Hypertension   . Mitral regurgitation   . PUD (peptic ulcer disease)    many yrs ago  . Right leg weakness   . Rotator cuff tear    left  . Slipped intervertebral disc   . Walker as ambulation aid     Past Surgical History:  Procedure Laterality Date  . ABDOMINAL HYSTERECTOMY  1966  . APPENDECTOMY    . CARDIOVASCULAR STRESS TEST  06-18-2005   DR Daneen Schick   NORMAL STUDY/ NO EVIDENCE ISCHEMIA/ EF 75%  . CARPAL TUNNEL RELEASE  09/17/2011   Procedure: CARPAL TUNNEL RELEASE;  Surgeon: Magnus Sinning, MD;  Location: [place];  Service: Orthopedics;  Laterality: Right;  . CARPAL TUNNEL RELEASE  10/22/2011   Procedure: CARPAL TUNNEL RELEASE;  Surgeon: Magnus Sinning, MD;  Location: [place];  Service: Orthopedics;  Laterality: Left;  . EYE SURGERY    . LUMBAR LAMINECTOMY/DECOMPRESSION MICRODISCECTOMY  01/14/2012   Procedure: LUMBAR LAMINECTOMY/DECOMPRESSION MICRODISCECTOMY 3 LEVELS;  Surgeon: Magnus Sinning, MD;  Location: [facility];  Service: Orthopedics;  Laterality: N/A;  Decompression Laminectomy of L2 - L3, L3 - L4 and L4 - L5 Central  (X-Ray)  . SHOULDER OPEN ROTATOR CUFF REPAIR  06/02/2012   Procedure: ROTATOR CUFF REPAIR SHOULDER OPEN;  Surgeon: Magnus Sinning, MD;  Location: [facility];  Service: Orthopedics;  Laterality: Left;  Left Shoulder Open Distal Clavicle Resection Anterior Acrominectomy and Rotator Cuff Repair  . SPINE SURGERY    . TRANSTHORACIC ECHOCARDIOGRAM  12-26-2008  DR Daneen Schick   NORMAL LVF/  EF  71%/   MILD ASYMMETRIC SEPTAL HYPERTROPHY/ MILD LEFT ATRIAL ENLARGEMENT/ MODERATELY ELEVATED ESTIMATED RIGHT VENTRICULAR SYSTOLIC PRESSURE/ MILD MITRAL  &  TRICUSPID VALVE REGURG.    Allergies: Patient has no known allergies.  Medications: Prior to Admission medications   Medication Sig Start Date End Date Taking? Authorizing Provider  allopurinol (ZYLOPRIM) 300 MG tablet TAKE 1 TABLET BY MOUTH  DAILY WITH BREAKFAST 01/28/19  Yes [place], Modena Nunnery, MD  amLODipine (NORVASC) 5 MG tablet TAKE 1 TABLET BY MOUTH  DAILY 06/09/19  Yes [place], Modena Nunnery, MD  azelastine (ASTELIN) 0.1 % nasal spray Place 1 spray into both nostrils 2 (two) times daily. Use in each nostril as directed 05/02/19  Yes [place], Modena Nunnery, MD  diclofenac sodium (VOLTAREN) 1 % GEL Apply 2 g topically 4 (four) times daily. 04/16/18  Yes [place], Modena Nunnery, MD  dorzolamide-timolol (COSOPT) 22.3-6.8 MG/ML ophthalmic solution Place 1 drop into the right eye 2 (two) times daily.   Yes [provider]  furosemide (LASIX) 40 MG tablet TAKE ONE-HALF TABLET BY  MOUTH DAILY 01/28/19  Yes [place], Modena Nunnery, MD  hydrALAZINE (APRESOLINE) 10 MG tablet TAKE 1 TABLET BY MOUTH  TWICE DAILY 06/09/19   Yes [place], Modena Nunnery, MD  omeprazole (PRILOSEC) 40 MG capsule TAKE 1 CAPSULE BY MOUTH  DAILY FOR REFLUX Patient taking differently: Take 40 mg by mouth daily.  01/28/19  Yes [place], Modena Nunnery, MD  potassium chloride SA (KLOR-CON) 20 MEQ tablet Take 20 mEq by mouth daily. 06/20/19  Yes [provider]  pravastatin (PRAVACHOL) 20 MG tablet TAKE 1 TABLET BY MOUTH IN  THE EVENING 06/10/19  Yes [place], Modena Nunnery, MD  timolol (BETIMOL) 0.5 % ophthalmic solution Place 1 drop into the left eye daily.   Yes [provider]  Vitamin D, Ergocalciferol, (DRISDOL) 1.25 MG (50000 UNIT) CAPS capsule Take 1 capsule by mouth once a week. 06/23/19  Yes [provider]  acetaminophen (TYLENOL) 500 MG tablet Take 1,500 mg by mouth every 6 (six) hours as needed for mild pain.    [provider]  albuterol (PROVENTIL HFA;VENTOLIN HFA) 108 (90 Base) MCG/ACT inhaler Inhale 2 puffs into the lungs every 6 (six) hours as needed for wheezing or shortness of breath. 08/26/18   [place], Modena Nunnery, MD  hydrocortisone (ANUSOL-HC) 25 MG suppository Place 1 suppository (25 mg total) rectally 2 (two) times daily. 02/23/18   Susy Frizzle, MD  levofloxacin (LEVAQUIN) 500 MG tablet Take 1 tablet (500 mg total) by mouth daily. 06/24/19   Veryl Speak, MD     Family History  Problem Relation Age of Onset  . Hypertension Father   . Hypertension Mother   . Stroke Mother   . Diabetes Sister   . Hypertension Sister   . Cancer Sister   . Arthritis Sister   . Hypertension Brother   . Alcohol abuse Brother   . Hypertension Sister   . Arthritis Sister   . Hypertension Sister   . Arthritis Sister   . Arthritis Sister   . Arthritis Sister   . Heart attack Son   . Colon cancer Neg Hx     Social History   Socioeconomic History  . Marital status: Widowed    Spouse name: Not on file  . Number of children: Not on file  . Years of education: Not on file  . Highest education level: Not on file    Occupational History  . Not on file  Tobacco Use  . Smoking status: Former Smoker    Packs/day: 1.00    Years: 30.00    Pack years: 30.00    Types: Cigarettes    Quit date: 05/28/1968    Years since quitting: 51.1  . Smokeless tobacco: Never Used  Substance and Sexual Activity  . Alcohol use: No  . Drug use: No  . Sexual activity: Not on file  Other Topics Concern  . Not on file  Social History Narrative  . Not on file   Social Determinants of Health   Financial Resource Strain:   .  Difficulty of Paying Living Expenses: Not on file  Food Insecurity:   . Worried About Charity fundraiser in the Last Year: Not on file  . Ran Out of Food in the Last Year: Not on file  Transportation Needs:   . Lack of Transportation (Medical): Not on file  . Lack of Transportation (Non-Medical): Not on file  Physical Activity:   . Days of Exercise per Week: Not on file  . Minutes of Exercise per Session: Not on file  Stress:   . Feeling of Stress : Not on file  Social Connections:   . Frequency of Communication with Friends and Family: Not on file  . Frequency of Social Gatherings with Friends and Family: Not on file  . Attends Religious Services: Not on file  . Active Member of Clubs or Organizations: Not on file  . Attends Archivist Meetings: Not on file  . Marital Status: Not on file     Review of Systems: A 12 point ROS discussed and pertinent positives are indicated in the HPI above.  All other systems are negative.  Review of Systems  Constitutional: Negative for activity change, fatigue and fever.  Respiratory: Negative for cough and shortness of breath.   Cardiovascular: Negative for chest pain.  Gastrointestinal: Negative for abdominal pain.  Psychiatric/Behavioral: Negative for behavioral problems and confusion.    Vital Signs: BP 139/71   Pulse 79   Temp (!) 97.5 F (36.4 C) (Oral)   Resp 18   Ht 5\' 3"  (1.6 m)   Wt 186 lb (84.4 kg)   SpO2 98%   BMI  32.95 kg/m   Physical Exam Vitals reviewed.  Cardiovascular:     Rate and Rhythm: Normal rate and regular rhythm.     Heart sounds: Normal heart sounds.  Pulmonary:     Effort: Pulmonary effort is normal.     Breath sounds: Normal breath sounds.  Abdominal:     Palpations: Abdomen is soft.  Musculoskeletal:        General: Normal range of motion.  Skin:    General: Skin is warm and dry.  Neurological:     Mental Status: She is oriented to person, place, and time.  Psychiatric:        Behavior: Behavior normal.        Thought Content: Thought content normal.        Judgment: Judgment normal.     Imaging: CT Head Wo Contrast  Result Date: 06/28/2019 CLINICAL DATA:  Posterior headache, increasing confusion and hallucinations for 1 month. EXAM: CT HEAD WITHOUT CONTRAST TECHNIQUE: Contiguous axial images were obtained from the base of the skull through the vertex without intravenous contrast. COMPARISON:  Brain MRI 02/05/1999. FINDINGS: Brain: No evidence of acute infarction, hemorrhage, hydrocephalus, extra-axial collection or mass lesion/mass effect. Vascular: No hyperdense vessel or unexpected calcification. Skull: No fracture or focal lesion. Sinuses/Orbits: Negative. Other: None. IMPRESSION: Negative head CT. Electronically Signed   By: Inge Rise M.D.   On: 06/28/2019 12:59   DG Chest Port 1 View  Result Date: 06/24/2019 CLINICAL DATA:  Chills. EXAM: PORTABLE CHEST 1 VIEW COMPARISON:  November 04, 2017 FINDINGS: Mild diffuse infiltrates are noted. There is no evidence of a pleural effusion or pneumothorax. The cardiac silhouette is mildly enlarged. There is marked severity calcification of the aortic arch. Degenerative changes are seen within the thoracic spine. IMPRESSION: 1. Mild diffuse bilateral infiltrates. Electronically Signed   By: Virgina Norfolk M.D.   On:  06/24/2019 15:48    Labs:  CBC: Recent Labs    05/12/19 1635 06/09/19 1128 06/24/19 1546 06/28/19 1149    WBC 7.1 6.2 8.3 8.1  HGB 9.8* 8.9* 8.0* 8.5*  HCT 32.6* 28.3* 26.8* 26.9*  PLT 252 233 243 261    COAGS: No results for input(s): INR, APTT in the last 8760 hours.  BMP: Recent Labs    05/02/19 1424 05/12/19 1635 06/09/19 1128 06/09/19 1128 06/13/19 1126 06/20/19 0853 06/24/19 1546 06/28/19 1149  NA   < > 135 141   < > 141 142 144 144  K   < > 4.8 5.8*   < > 5.0 4.6 4.4 4.8  CL   < > 106 114*   < > 111* 110 114* 109  CO2   < > 20* 19*   < > 22 21 22 25   GLUCOSE   < > 102* 106*   < > 120* 92 148* 132*  BUN   < > 43* 45*   < > 43* 47* 44* 50*  CALCIUM   < > 9.1 9.4   < > 9.2 9.1 8.9 9.5  CREATININE   < > 2.91* 3.41*   < > 3.21* 3.27* 3.07* 4.05*  GFRNONAA  --  13* 11*  --  12*  --  13*  --   GFRAA  --  15* 13*  --  14*  --  14*  --    < > = values in this interval not displayed.    LIVER FUNCTION TESTS: Recent Labs    11/17/18 0959 04/27/19 1126 06/09/19 1128 06/28/19 1149  BILITOT 0.3 0.3 0.3 0.3  AST 13 10 12 12   ALT 6 3* 7 5*  PROT 6.8 7.0 6.5 6.8    TUMOR MARKERS: No results for input(s): AFPTM, CEA, CA199, CHROMGRNA in the last 8760 hours.  Assessment and Plan:  Worsening CKD Now stage 5 per Dr Theador Hawthorne Scheduled for random renal biopsy Risks and benefits of random renal biopsy was discussed with the patient and/or patient's family including, but not limited to bleeding, infection, damage to adjacent structures or low yield requiring additional tests.  All of the questions were answered and there is agreement to proceed. Consent signed and in chart.   Thank you for this interesting consult.  I greatly enjoyed meeting [name] and look forward to participating in their care.  A copy of this report was sent to the requesting provider on this date.  Electronically Signed: Lavonia Drafts, PA-C 07/04/2019, 7:06 AM   I spent a total of  30 Minutes   in face to face in clinical consultation, greater than 50% of which was counseling/coordinating  care for random renal biopsy

## 2019-07-04 NOTE — Progress Notes (Signed)
Second void revealed that blood was resolving, pink tinged urine noted. Pt continues to deny pain.

## 2019-07-04 NOTE — Progress Notes (Signed)
Patient ID: [name], female   DOB: [birth year], 86 y.o.   MRN: [id number]   Now urine only blood tinged No complaints VSS: 116/50; HR 74; 02 sat 100 % RA  Pt has eaten without N/V Denies abd pain  Discussed with Dr Pascal Lux May DC to home now with Daughter  Keep watch of urine If bleeding recurs and worsens-- must come to ED She is aware

## 2019-07-04 NOTE — Telephone Encounter (Signed)
Spoke with pt's daughter and moved appt back up to 07/20/2019.  Daughter appreciative for call.

## 2019-07-04 NOTE — Progress Notes (Signed)
Pt voided again, light pink tinged urine noted.  P Tur[place]/Dr/ Watts notified.  OK to discharge at 11:30.

## 2019-07-06 ENCOUNTER — Emergency Department (HOSPITAL_COMMUNITY): Payer: Medicare Other

## 2019-07-06 ENCOUNTER — Inpatient Hospital Stay (HOSPITAL_COMMUNITY)
Admission: EM | Admit: 2019-07-06 | Discharge: 2019-07-11 | DRG: 920 | Disposition: A | Discharge status: Home Health | Payer: Medicare Other | Attending: Internal Medicine | Admitting: Internal Medicine

## 2019-07-06 ENCOUNTER — Other Ambulatory Visit: Payer: Self-pay

## 2019-07-06 ENCOUNTER — Encounter (HOSPITAL_COMMUNITY): Payer: Self-pay | Admitting: Emergency Medicine

## 2019-07-06 ENCOUNTER — Telehealth: Payer: Self-pay | Admitting: Interventional Cardiology

## 2019-07-06 DIAGNOSIS — D62 Acute posthemorrhagic anemia: Secondary | ICD-10-CM | POA: Diagnosis present

## 2019-07-06 DIAGNOSIS — Z791 Long term (current) use of non-steroidal anti-inflammatories (NSAID): Secondary | ICD-10-CM

## 2019-07-06 DIAGNOSIS — R310 Gross hematuria: Secondary | ICD-10-CM | POA: Diagnosis present

## 2019-07-06 DIAGNOSIS — Z90710 Acquired absence of both cervix and uterus: Secondary | ICD-10-CM

## 2019-07-06 DIAGNOSIS — D631 Anemia in chronic kidney disease: Secondary | ICD-10-CM | POA: Diagnosis present

## 2019-07-06 DIAGNOSIS — N1339 Other hydronephrosis: Secondary | ICD-10-CM | POA: Diagnosis present

## 2019-07-06 DIAGNOSIS — H409 Unspecified glaucoma: Secondary | ICD-10-CM | POA: Diagnosis present

## 2019-07-06 DIAGNOSIS — Z8711 Personal history of peptic ulcer disease: Secondary | ICD-10-CM

## 2019-07-06 DIAGNOSIS — Z87891 Personal history of nicotine dependence: Secondary | ICD-10-CM

## 2019-07-06 DIAGNOSIS — N179 Acute kidney failure, unspecified: Secondary | ICD-10-CM | POA: Diagnosis present

## 2019-07-06 DIAGNOSIS — E119 Type 2 diabetes mellitus without complications: Secondary | ICD-10-CM

## 2019-07-06 DIAGNOSIS — D72829 Elevated white blood cell count, unspecified: Secondary | ICD-10-CM | POA: Diagnosis present

## 2019-07-06 DIAGNOSIS — D638 Anemia in other chronic diseases classified elsewhere: Secondary | ICD-10-CM | POA: Diagnosis present

## 2019-07-06 DIAGNOSIS — M109 Gout, unspecified: Secondary | ICD-10-CM | POA: Diagnosis present

## 2019-07-06 DIAGNOSIS — R319 Hematuria, unspecified: Secondary | ICD-10-CM | POA: Diagnosis present

## 2019-07-06 DIAGNOSIS — Y848 Other medical procedures as the cause of abnormal reaction of the patient, or of later complication, without mention of misadventure at the time of the procedure: Secondary | ICD-10-CM | POA: Diagnosis present

## 2019-07-06 DIAGNOSIS — S37019A Minor contusion of unspecified kidney, initial encounter: Secondary | ICD-10-CM

## 2019-07-06 DIAGNOSIS — K219 Gastro-esophageal reflux disease without esophagitis: Secondary | ICD-10-CM | POA: Diagnosis present

## 2019-07-06 DIAGNOSIS — D649 Anemia, unspecified: Secondary | ICD-10-CM | POA: Diagnosis not present

## 2019-07-06 DIAGNOSIS — Z79899 Other long term (current) drug therapy: Secondary | ICD-10-CM

## 2019-07-06 DIAGNOSIS — I5032 Chronic diastolic (congestive) heart failure: Secondary | ICD-10-CM | POA: Diagnosis present

## 2019-07-06 DIAGNOSIS — E1136 Type 2 diabetes mellitus with diabetic cataract: Secondary | ICD-10-CM | POA: Diagnosis present

## 2019-07-06 DIAGNOSIS — G629 Polyneuropathy, unspecified: Secondary | ICD-10-CM

## 2019-07-06 DIAGNOSIS — I340 Nonrheumatic mitral (valve) insufficiency: Secondary | ICD-10-CM | POA: Diagnosis present

## 2019-07-06 DIAGNOSIS — E1121 Type 2 diabetes mellitus with diabetic nephropathy: Secondary | ICD-10-CM | POA: Diagnosis present

## 2019-07-06 DIAGNOSIS — Z8249 Family history of ischemic heart disease and other diseases of the circulatory system: Secondary | ICD-10-CM

## 2019-07-06 DIAGNOSIS — E785 Hyperlipidemia, unspecified: Secondary | ICD-10-CM | POA: Diagnosis present

## 2019-07-06 DIAGNOSIS — N185 Chronic kidney disease, stage 5: Secondary | ICD-10-CM | POA: Diagnosis present

## 2019-07-06 DIAGNOSIS — Z823 Family history of stroke: Secondary | ICD-10-CM

## 2019-07-06 DIAGNOSIS — N3289 Other specified disorders of bladder: Secondary | ICD-10-CM | POA: Diagnosis present

## 2019-07-06 DIAGNOSIS — I132 Hypertensive heart and chronic kidney disease with heart failure and with stage 5 chronic kidney disease, or end stage renal disease: Secondary | ICD-10-CM | POA: Diagnosis present

## 2019-07-06 DIAGNOSIS — Z20822 Contact with and (suspected) exposure to covid-19: Secondary | ICD-10-CM | POA: Diagnosis present

## 2019-07-06 DIAGNOSIS — E1122 Type 2 diabetes mellitus with diabetic chronic kidney disease: Secondary | ICD-10-CM | POA: Diagnosis present

## 2019-07-06 DIAGNOSIS — E1142 Type 2 diabetes mellitus with diabetic polyneuropathy: Secondary | ICD-10-CM | POA: Diagnosis present

## 2019-07-06 DIAGNOSIS — G3184 Mild cognitive impairment, so stated: Secondary | ICD-10-CM | POA: Diagnosis present

## 2019-07-06 DIAGNOSIS — Z8673 Personal history of transient ischemic attack (TIA), and cerebral infarction without residual deficits: Secondary | ICD-10-CM

## 2019-07-06 DIAGNOSIS — N99840 Postprocedural hematoma of a genitourinary system organ or structure following a genitourinary system procedure: Principal | ICD-10-CM | POA: Diagnosis present

## 2019-07-06 DIAGNOSIS — I10 Essential (primary) hypertension: Secondary | ICD-10-CM | POA: Diagnosis present

## 2019-07-06 DIAGNOSIS — Z833 Family history of diabetes mellitus: Secondary | ICD-10-CM

## 2019-07-06 DIAGNOSIS — Z8261 Family history of arthritis: Secondary | ICD-10-CM

## 2019-07-06 DIAGNOSIS — Z9181 History of falling: Secondary | ICD-10-CM

## 2019-07-06 LAB — CBC
HCT: 23 % — ABNORMAL LOW (ref 36.0–46.0)
Hemoglobin: 7 g/dL — ABNORMAL LOW (ref 12.0–15.0)
MCH: 29.7 pg (ref 26.0–34.0)
MCHC: 30.4 g/dL (ref 30.0–36.0)
MCV: 97.5 fL (ref 80.0–100.0)
Platelets: 235 10*3/uL (ref 150–400)
RBC: 2.36 MIL/uL — ABNORMAL LOW (ref 3.87–5.11)
RDW: 17.1 % — ABNORMAL HIGH (ref 11.5–15.5)
WBC: 12.6 10*3/uL — ABNORMAL HIGH (ref 4.0–10.5)
nRBC: 0 % (ref 0.0–0.2)

## 2019-07-06 LAB — BASIC METABOLIC PANEL
Anion gap: 14 (ref 5–15)
BUN: 58 mg/dL — ABNORMAL HIGH (ref 8–23)
CO2: 22 mmol/L (ref 22–32)
Calcium: 9.3 mg/dL (ref 8.9–10.3)
Chloride: 107 mmol/L (ref 98–111)
Creatinine, Ser: 4.82 mg/dL — ABNORMAL HIGH (ref 0.44–1.00)
GFR calc Af Amer: 8 mL/min — ABNORMAL LOW (ref >60)
GFR calc non Af Amer: 7 mL/min — ABNORMAL LOW (ref >60)
Glucose, Bld: 157 mg/dL — ABNORMAL HIGH (ref 70–99)
Potassium: 4.4 mmol/L (ref 3.5–5.1)
Sodium: 143 mmol/L (ref 135–145)

## 2019-07-06 LAB — URINALYSIS, MICROSCOPIC (REFLEX): RBC / HPF: >50 RBC/hpf (ref 0–5)

## 2019-07-06 LAB — URINALYSIS, ROUTINE W REFLEX MICROSCOPIC

## 2019-07-06 LAB — RESPIRATORY PANEL BY RT PCR (FLU A&B, COVID)
Influenza A by PCR: NEGATIVE
Influenza B by PCR: NEGATIVE
SARS Coronavirus 2 by RT PCR: NEGATIVE

## 2019-07-06 LAB — TROPONIN I (HIGH SENSITIVITY)
Troponin I (High Sensitivity): 22 ng/L — ABNORMAL HIGH (ref <18)
Troponin I (High Sensitivity): 25 ng/L — ABNORMAL HIGH (ref <18)

## 2019-07-06 LAB — TSH: TSH: 0.912 u[IU]/mL (ref 0.350–4.500)

## 2019-07-06 LAB — ABO/RH: ABO/RH(D): O POS

## 2019-07-06 LAB — PREPARE RBC (CROSSMATCH)

## 2019-07-06 MED ORDER — BISACODYL 10 MG RE SUPP
10.0000 mg | Freq: Every day | RECTAL | Status: DC | PRN
  Administered 2019-07-09: 10 mg via RECTAL
  Filled 2019-07-06: qty 1

## 2019-07-06 MED ORDER — TIMOLOL MALEATE 0.5 % OP SOLN
1.0000 [drp] | Freq: Every day | OPHTHALMIC | Status: DC
  Administered 2019-07-06 – 2019-07-11 (×6): 1 [drp] via OPHTHALMIC
  Filled 2019-07-06 (×2): qty 5

## 2019-07-06 MED ORDER — PRAVASTATIN SODIUM 10 MG PO TABS
20.0000 mg | ORAL_TABLET | Freq: Every evening | ORAL | Status: DC
  Administered 2019-07-06 – 2019-07-10 (×5): 20 mg via ORAL
  Filled 2019-07-06 (×5): qty 2

## 2019-07-06 MED ORDER — ACETAMINOPHEN 325 MG PO TABS: 650.0000 mg | ORAL_TABLET | Freq: Four times a day (QID) | ORAL | Status: DC | PRN

## 2019-07-06 MED ORDER — ACETAMINOPHEN 650 MG RE SUPP: 650.0000 mg | Freq: Four times a day (QID) | RECTAL | Status: DC | PRN

## 2019-07-06 MED ORDER — DORZOLAMIDE HCL-TIMOLOL MAL 2-0.5 % OP SOLN
1.0000 [drp] | Freq: Two times a day (BID) | OPHTHALMIC | Status: DC
  Administered 2019-07-06 – 2019-07-11 (×10): 1 [drp] via OPHTHALMIC
  Filled 2019-07-06: qty 10

## 2019-07-06 MED ORDER — POLYETHYLENE GLYCOL 3350 17 G PO PACK: 17.0000 g | PACK | Freq: Every day | ORAL | Status: DC | PRN

## 2019-07-06 MED ORDER — TIMOLOL HEMIHYDRATE 0.5 % OP SOLN: 1.0000 [drp] | Freq: Every day | OPHTHALMIC | Status: DC

## 2019-07-06 MED ORDER — SODIUM CHLORIDE 0.9% FLUSH: 3.0000 mL | Freq: Once | INTRAVENOUS | Status: DC

## 2019-07-06 MED ORDER — QUETIAPINE FUMARATE 25 MG PO TABS
25.0000 mg | ORAL_TABLET | Freq: Every day | ORAL | Status: DC
  Administered 2019-07-06 – 2019-07-10 (×5): 25 mg via ORAL
  Filled 2019-07-06 (×5): qty 1

## 2019-07-06 MED ORDER — TRAMADOL HCL 50 MG PO TABS
50.0000 mg | ORAL_TABLET | Freq: Four times a day (QID) | ORAL | Status: DC | PRN
  Administered 2019-07-07 – 2019-07-09 (×2): 50 mg via ORAL
  Filled 2019-07-06 (×2): qty 1

## 2019-07-06 MED ORDER — SODIUM CHLORIDE 0.9 % IV SOLN: 10.0000 mL/h | Freq: Once | INTRAVENOUS | Status: AC

## 2019-07-06 MED ORDER — ONDANSETRON HCL 4 MG/2ML IJ SOLN: 4.0000 mg | Freq: Four times a day (QID) | INTRAMUSCULAR | Status: DC | PRN

## 2019-07-06 MED ORDER — ONDANSETRON HCL 4 MG PO TABS: 4.0000 mg | ORAL_TABLET | Freq: Four times a day (QID) | ORAL | Status: DC | PRN

## 2019-07-06 MED ORDER — HYDRALAZINE HCL 10 MG PO TABS
10.0000 mg | ORAL_TABLET | Freq: Two times a day (BID) | ORAL | Status: DC
  Administered 2019-07-06 – 2019-07-11 (×10): 10 mg via ORAL
  Filled 2019-07-06 (×10): qty 1

## 2019-07-06 MED ORDER — PANTOPRAZOLE SODIUM 40 MG PO TBEC
40.0000 mg | DELAYED_RELEASE_TABLET | Freq: Every day | ORAL | Status: DC
  Administered 2019-07-06 – 2019-07-10 (×5): 40 mg via ORAL
  Filled 2019-07-06 (×5): qty 1

## 2019-07-06 NOTE — ED Provider Notes (Signed)
[facility] EMERGENCY DEPARTMENT Provider Note   CSN: [id number] Arrival date & time: 07/06/19  1146  History Chief Complaint  Patient presents with   Hematuria   [name] is a 86 y/o female, with a PMH of diastolic CHF, DMT2, and GERD, presents to the [facility] ED with complaints of hematuria. Per [name], she had a kidney biopsy on 07/04/2019, with complaints of blood in her urine. On 07/05/19 patient states that she began to feel fatigued, pain at the operative site, and lost her appetite. Additionally, she began to have pain on urination and passed clots. She says that her pain was "like having a kidney stones." She continued to have pain on urination and fatigue the AM of 07/06/19, where she began to . She denies nausea, vomiting, fevers, headaches, changes in vision, diaphoresis, chest pain, abdominal pain, constipation, diarrhea, or vaginal bleeding.     Past Medical History:  Diagnosis Date   Arthritis    Bilateral carpal tunnel syndrome    Cataracts, both eyes    Chronic diastolic CHF (congestive heart failure) ([place])    a. 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   mini stroke --  many yrs ago   History of transfusion    Hyperlipidemia    Hypertension    Mitral regurgitation    PUD (peptic ulcer disease)    many yrs ago   Right leg weakness    Rotator cuff tear    left   Slipped intervertebral disc    Walker as ambulation aid     Patient Active Problem List   Diagnosis Date Noted   Hematuria 07/06/2019   Kidney  stones 05/18/2019   Gross hematuria 05/18/2019   OA (osteoarthritis) 04/18/2018   Anemia 04/16/2018   Senile purpura ([place]) 11/30/2017   Dysphagia 07/28/2016   Peripheral edema 07/02/2016   CKD (chronic kidney disease) stage 4, GFR 15-29 ml/min (HCC) 05/30/2015   GERD (gastroesophageal reflux disease) 05/30/2015   Peripheral neuropathy 03/19/2015   Diabetes mellitus, type II ([place]) 02/05/2015   Spinal stenosis of lumbar region 02/05/2015   Chronic diastolic CHF (congestive heart failure) ([place]) 05/01/2014   Essential hypertension 05/01/2014   Hyperlipidemia 05/01/2014    Past Surgical History:  Procedure Laterality Date   ABDOMINAL HYSTERECTOMY  1966   APPENDECTOMY     CARDIOVASCULAR STRESS TEST  06-18-2005   DR Daneen Schick   NORMAL STUDY/ NO EVIDENCE ISCHEMIA/ EF 75%   CARPAL TUNNEL RELEASE  09/17/2011   Procedure: CARPAL TUNNEL RELEASE;  Surgeon: Magnus Sinning, MD;  Location: [facility];  Service: Orthopedics;  Laterality: Right;   CARPAL TUNNEL RELEASE  10/22/2011   Procedure: CARPAL TUNNEL RELEASE;  Surgeon: Magnus Sinning, MD;  Location: [place];  Service: Orthopedics;  Laterality: Left;   EYE SURGERY     LUMBAR LAMINECTOMY/DECOMPRESSION MICRODISCECTOMY  01/14/2012   Procedure: LUMBAR LAMINECTOMY/DECOMPRESSION MICRODISCECTOMY 3 LEVELS;  Surgeon: Magnus Sinning, MD;  Location: [facility];  Service: Orthopedics;  Laterality: N/A;  Decompression Laminectomy of L2 - L3, L3 - L4 and L4 - L5 Central  (X-Ray)   SHOULDER OPEN ROTATOR CUFF REPAIR  06/02/2012   Procedure: ROTATOR CUFF REPAIR SHOULDER OPEN;  Surgeon: Magnus Sinning, MD;  Location: [facility];  Service: Orthopedics;  Laterality: Left;  Left Shoulder Open Distal Clavicle Resection Anterior Acrominectomy and Rotator Cuff Repair   SPINE SURGERY     TRANSTHORACIC ECHOCARDIOGRAM  12-26-2008  DR Daneen Schick   NORMAL LVF/  EF  71%/   MILD ASYMMETRIC SEPTAL HYPERTROPHY/ MILD  LEFT ATRIAL ENLARGEMENT/ MODERATELY ELEVATED ESTIMATED RIGHT VENTRICULAR SYSTOLIC PRESSURE/ MILD MITRAL  &  TRICUSPID VALVE REGURG.     OB History   No obstetric history on file.     Family History  Problem Relation Age of Onset   Hypertension Father    Hypertension Mother    Stroke Mother    Diabetes Sister    Hypertension Sister    Cancer Sister    Arthritis Sister    Hypertension Brother    Alcohol abuse Brother    Hypertension Sister    Arthritis Sister    Hypertension Sister    Arthritis Sister    Arthritis Sister    Arthritis Sister    Heart attack Son    Colon cancer Neg Hx     Social History   Tobacco Use   Smoking status: Former Smoker    Packs/day: 1.00    Years: 30.00    Pack years: 30.00    Types: Cigarettes    Quit date: 05/28/1968    Years since quitting: 51.1   Smokeless tobacco: Never Used  Substance Use Topics   Alcohol use: No   Drug use: No    Home Medications Prior to Admission medications   Medication Sig Start Date End Date Taking? Authorizing Provider  acetaminophen (TYLENOL) 500 MG tablet Take 1,500 mg by mouth every 6 (six) hours as needed for mild pain.   Yes [provider]  allopurinol (ZYLOPRIM) 300 MG tablet TAKE 1 TABLET BY MOUTH  DAILY WITH BREAKFAST Patient taking differently: Take 300 mg by mouth daily.  01/28/19  Yes [place], Modena Nunnery, MD  amLODipine (NORVASC) 5 MG tablet TAKE 1 TABLET BY MOUTH  DAILY Patient taking differently: Take 5 mg by mouth daily.  06/09/19  Yes [place], Modena Nunnery, MD  diclofenac sodium (VOLTAREN) 1 % GEL Apply 2 g topically 4 (four) times daily. 04/16/18  Yes [place], Modena Nunnery, MD  dorzolamide-timolol (COSOPT) 22.3-6.8 MG/ML ophthalmic solution Place 1 drop into the right eye 2 (two) times daily.   Yes [provider]  furosemide (LASIX) 40 MG tablet TAKE ONE-HALF TABLET BY  MOUTH DAILY Patient taking differently: Take 20 mg by mouth daily.  01/28/19  Yes [place], Modena Nunnery, MD  hydrALAZINE (APRESOLINE) 10 MG tablet TAKE 1 TABLET BY MOUTH  TWICE DAILY Patient taking differently: Take 10 mg by mouth 2 (two) times daily.  06/09/19  Yes [place], Modena Nunnery, MD  omeprazole (PRILOSEC) 40 MG capsule TAKE 1 CAPSULE BY MOUTH  DAILY FOR REFLUX Patient taking differently: Take 40 mg by mouth daily.  01/28/19  Yes [place], Modena Nunnery, MD  pravastatin (PRAVACHOL) 20 MG tablet TAKE 1 TABLET BY MOUTH IN  THE EVENING Patient taking differently: Take 20 mg by mouth daily.  06/10/19  Yes [place], Modena Nunnery, MD  QUEtiapine (SEROQUEL) 25 MG tablet Take 25 mg by mouth at  bedtime. 06/29/19  Yes [provider]  timolol (BETIMOL) 0.5 % ophthalmic solution Place 1 drop into the left eye daily.   Yes [provider]  Vitamin D, Ergocalciferol, (DRISDOL) 1.25 MG (50000 UNIT) CAPS capsule Take 1 capsule by mouth once a week. 06/23/19  Yes [provider]    Allergies    Patient has no known allergies.  Review of Systems   Review of Systems  Constitutional: Positive for appetite change and fatigue. Negative for activity change, chills, diaphoresis and fever.  Respiratory: Negative for chest tightness, shortness of breath, wheezing and stridor.   Cardiovascular: Positive for leg swelling. Negative for chest pain.       Patient reports leg swelling due to not taking her home dose of lasix in 2 days.  Gastrointestinal: Negative for abdominal distention, abdominal pain, anal bleeding, blood in stool, constipation, diarrhea, nausea, rectal pain and vomiting.  Endocrine: Negative for polyuria.  Genitourinary: Positive for dysuria, flank pain and hematuria. Negative for decreased urine volume, difficulty urinating, frequency, menstrual problem, pelvic pain, urgency, vaginal bleeding, vaginal discharge and vaginal pain.  Musculoskeletal: Negative for arthralgias.  Skin: Negative for rash.  Neurological: Negative for dizziness, weakness and headaches.   Physical Exam Updated  Vital Signs BP (!) 121/55    Pulse 82    Temp 97.7 F (36.5 C) (Oral)    Resp 20    SpO2 97%   Physical Exam Vitals and nursing note reviewed.  Constitutional:      General: She is not in acute distress.    Appearance: Normal appearance. She is normal weight. She is not ill-appearing or toxic-appearing.     Comments: Patient lying comfortably at bedside, no acute distress.  HENT:     Head: Normocephalic and atraumatic.     Right Ear: External ear normal.     Left Ear: External ear normal.  Eyes:     General:        Right eye: No discharge.        Left eye: No discharge.     Conjunctiva/sclera: Conjunctivae normal.  Cardiovascular:     Rate and Rhythm: Normal rate and regular rhythm.     Pulses: Normal pulses.     Heart sounds: Normal heart sounds. No murmur. No friction rub. No gallop.   Pulmonary:     Effort: Pulmonary effort is normal.     Breath sounds: Normal breath sounds. No wheezing, rhonchi or rales.  Abdominal:     General: Abdomen is flat. Bowel sounds are normal.     Palpations: Abdomen is soft.     Tenderness: There is no abdominal tenderness. There is no right CVA tenderness, left CVA tenderness or guarding.  Musculoskeletal:     Right lower leg: Edema present.     Left lower leg: Edema present.     Comments: 2+ Pitting edema in the lower extremities bilaterally.  Neurological:     General: No focal deficit present.     Mental Status: She is alert and oriented to person, place, and time.  Psychiatric:        Mood and Affect: Mood normal.        Behavior: Behavior normal.    ED Results / Procedures / Treatments   Labs (all labs ordered are listed, but only abnormal results are displayed) Labs Reviewed  BASIC METABOLIC PANEL - Abnormal; Notable for the following components:      Result Value   Glucose, Bld 157 (*)  BUN 58 (*)    Creatinine, Ser 4.82 (*)    GFR calc non Af Amer 7 (*)    GFR calc Af Amer 8 (*)    All other components within normal  limits  CBC - Abnormal; Notable for the following components:   WBC 12.6 (*)    RBC 2.36 (*)    Hemoglobin 7.0 (*)    HCT 23.0 (*)    RDW 17.1 (*)    All other components within normal limits  TROPONIN I (HIGH SENSITIVITY) - Abnormal; Notable for the following components:   Troponin I (High Sensitivity) 22 (*)    All other components within normal limits  RESPIRATORY PANEL BY RT PCR (FLU A&B, COVID)  RESPIRATORY PANEL BY RT PCR (FLU A&B, COVID)  URINE CULTURE  CULTURE, BLOOD (ROUTINE X 2)  CULTURE, BLOOD (ROUTINE X 2)  URINALYSIS, ROUTINE W REFLEX MICROSCOPIC  PREPARE RBC (CROSSMATCH)  TYPE AND SCREEN  ABO/RH  TROPONIN I (HIGH SENSITIVITY)    EKG None  Radiology CT ABDOMEN PELVIS WO CONTRAST  Result Date: 07/06/2019 CLINICAL DATA:  Gross hematuria status post renal biopsy. EXAM: CT ABDOMEN AND PELVIS WITHOUT CONTRAST TECHNIQUE: Multidetector CT imaging of the abdomen and pelvis was performed following the standard protocol without IV contrast. COMPARISON:  May 12, 2019. FINDINGS: Lower chest: No acute abnormality. Hepatobiliary: No focal liver abnormality is seen. No gallstones, gallbladder wall thickening, or biliary dilatation. Pancreas: Unremarkable. No pancreatic ductal dilatation or surrounding inflammatory changes. Spleen: Normal in size without focal abnormality. Adrenals/Urinary Tract: Adrenal glands appear normal. Nonobstructive right nephrolithiasis is noted, including 16 x 14 mm staghorn type calculus in lower pole calyx. There is noted moderate size perinephric hematoma around the lower pole of left kidney. Mild left hydroureteronephrosis is noted without definite obstructing calculus. Large amount of blood clot is noted in the dependent portion of the urinary bladder. Stomach/Bowel: Stomach appears normal. There is no evidence of bowel obstruction or inflammation. Sigmoid diverticulosis is noted without inflammation. Vascular/Lymphatic: Aortic atherosclerosis. No  enlarged abdominal or pelvic lymph nodes. Reproductive: Status post hysterectomy. No adnexal masses. Other: No abdominal wall hernia or abnormality. No abdominopelvic ascites. Musculoskeletal: Multilevel degenerative disc disease is noted in the lower lumbar spine. No acute osseous abnormality is noted. IMPRESSION: Moderate sized perinephric hematoma is seen around the lower pole of left kidney, with mild left hydroureteronephrosis present without obstructing calculus. Large amount of blood clot is noted in the lumen of the urinary bladder. Nonobstructive right nephrolithiasis, including large staghorn type calculus in lower pole collecting system. Sigmoid diverticulosis without inflammation. Aortic Atherosclerosis (ICD10-I70.0). Electronically Signed   By: Marijo Conception M.D.   On: 07/06/2019 14:24   DG Chest 2 View  Result Date: 07/06/2019 CLINICAL DATA:  Shortness of breath, left flank pain following left renal biopsy EXAM: CHEST - 2 VIEW COMPARISON:  06/23/2018 FINDINGS: Cardiac shadow is enlarged but stable. Aortic calcifications are seen. Previously seen patchy infiltrates have nearly completely resolved. Bibasilar atelectatic changes are noted. No sizable effusion is seen. No bony abnormality is noted. IMPRESSION: Bibasilar atelectasis. Previously seen infiltrates have nearly completely resolved. Electronically Signed   By: Inez Catalina M.D.   On: 07/06/2019 12:27    Procedures Procedures (including critical care time)  Medications Ordered in ED Medications  sodium chloride flush (NS) 0.9 % injection 3 mL (has no administration in time range)  0.9 %  sodium chloride infusion (has no administration in time range)    ED Course  I have  reviewed the triage vital signs and the nursing notes.  Pertinent labs & imaging results that were available during my care of the patient were reviewed by me and considered in my medical decision making (see chart for details).  Final Clinical Impression(s)  / ED Diagnoses Final diagnoses:  Gross hematuria  Ms. [name] is a 86 y/o female with a PMH of diastolic heart failure, HTN, GERD, and DMT2, who presents to the [facility] with hematuria 2/2 to moderate perinephric hematoma on the L kidney.    Patient evaluated for hematuria. During her ED course she was noted to have symptomatic anemia with a Hgb of 7.0 where she received 1 unit of PRBC. Additionally she has an AKI likely 2/2 to her hematoma or possible obstruction due to the large amount of blood clot in the lumen of the urinary bladder. Spoke with interventional radiology, who would like to observe patient, due to her symptomatic anemia. Hospitalist service was consulted for admission and Urology was consulted for further recommendations for the patient's blood clots in the bladder.   Rx / DC Orders ED Discharge Orders    None       Maudie Mercury, MD 07/06/19 1647    Isla Pence, MD 07/07/19 [phone]

## 2019-07-06 NOTE — Telephone Encounter (Signed)
Patient's daughter is calling stating the patient is experiencing symptoms of throwing up, not eating, fatigue, bleeding from biopsy performed on 07/04/19, swelling in ankles, and SOB. [name] states the SOB is not severe it's the other symptoms that are concerning her. Did not go into any greater detail on symptoms in order to use dot phrase. She states she would like Maude Leriche to call her in regards to this. Please advise

## 2019-07-06 NOTE — ED Notes (Signed)
Report Called to [facility] RN, RN aware foley needs to be placed on floor due to unavailability of needed size/3 way in ED

## 2019-07-06 NOTE — ED Triage Notes (Signed)
Pt here from home with c/o sob and weakness , pt also states that she started having some vaginal bleeding over the last few days

## 2019-07-06 NOTE — ED Notes (Signed)
Pt has no c/o SOB, pt reports she had a kidney biopsy on Monday and has since started having hematuria w/ clots.

## 2019-07-06 NOTE — H&P (Signed)
History and Physical    [name] [id number] DOB: [birth year] DOA: 07/06/2019  PCP: Alycia Rossetti, MD   Patient coming from: Home  Chief Complaint: Hematuria, Fatigue, Nausea   HPI: [name] is a 86 y.o. female with medical history significant of cataracts of both eyes, chronic diastolic CHF, diabetes mellitus type 2 complicated by neuropathy currently not on any medication which is diet controlled, GERD, glaucoma, history of CVA, peptic ulcer disease, hypertension, hyperlipidemia as well as other comorbidities who presents with a chief complaint of fatigue and hematuria that worsened since her renal biopsy 2 days ago.  She was advised by her nephrologist to have a renal biopsy given her progressively worsening renal function and she underwent biopsy 2 days ago and was deemed stable for discharge at that time however since then she has been more fatigued and started developing significant Monday hematuria.  States this morning that she was nauseous but did not actually vomit and states that she felt a little short of breath.  Currently denies any shortness of breath now.  Because of her symptoms she presented to the emergency room and was also complained of some pain where her biopsy site was.  She denies any chest pain, lightheadedness or dizziness.  TRH was asked to admit this patient for her hematuria and fatigue as well as symptomatic anemia and interventional radiology was consulted and the case was discussed with Urology.  ED Course: In the ED she had basic blood work done and was typed and screened and transfused 1 unit of PRBCs.  Interventional radiology was consulted for further evaluation and she had a CT of the abdomen pelvis as well as an EKG and a chest x-ray which showed bilateral atelectasis and previously seen infiltrates resolved.   Review of Systems: As per HPI otherwise all other systems reviewed and negative.   Past Medical History:  Diagnosis Date  . Arthritis    . Bilateral carpal tunnel syndrome   . Cataracts, both eyes   . Chronic diastolic CHF (congestive heart failure) ([place])    a. Echo (09/2012): Mild LVH, EF 65-70%, moderately elevated RVSP, moderate TR, mild to moderate MR, mild LAE, grade 2 diastolic dysfunction  . Diabetes mellitus ORAL MED  . Diabetic neuropathy (HCC) both hands and legs  . GERD (gastroesophageal reflux disease)   . Glaucoma   . Gout   . History of echocardiogram    Echo 1/19:  EF 65-70, no RWMA, Gr 2 DD, mild MR, severe LAE, PASP 62   . History of falling RECENT FALL 1 WK AGO--   NO INJURY  . 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 History of transfusion   . Hyperlipidemia   . Hypertension   . Mitral regurgitation   . PUD (peptic ulcer disease)    many yrs ago  . Right leg weakness   . Rotator cuff tear    left  . Slipped intervertebral disc   . Walker as ambulation aid    Past Surgical History:  Procedure Laterality Date  . ABDOMINAL HYSTERECTOMY  1966  . APPENDECTOMY    . CARDIOVASCULAR STRESS TEST  06-18-2005   DR Daneen Schick   NORMAL STUDY/ NO EVIDENCE ISCHEMIA/ EF 75%  . CARPAL TUNNEL RELEASE  09/17/2011   Procedure: CARPAL TUNNEL RELEASE;  Surgeon: Magnus Sinning, MD;  Location: [facility];  Service: Orthopedics;  Laterality: Right;  . CARPAL TUNNEL RELEASE  10/22/2011   Procedure: CARPAL TUNNEL RELEASE;  Surgeon: Magnus Sinning, MD;  Location: [facility];  Service: Orthopedics;  Laterality: Left;  . EYE SURGERY    . LUMBAR LAMINECTOMY/DECOMPRESSION MICRODISCECTOMY  01/14/2012   Procedure: LUMBAR LAMINECTOMY/DECOMPRESSION MICRODISCECTOMY 3 LEVELS;  Surgeon: Magnus Sinning, MD;  Location: [facility];  Service: Orthopedics;  Laterality: N/A;  Decompression Laminectomy of L2 - L3, L3 - L4 and L4 - L5 Central  (X-Ray)  . SHOULDER OPEN ROTATOR CUFF REPAIR  06/02/2012   Procedure: ROTATOR CUFF REPAIR SHOULDER OPEN;  Surgeon: Magnus Sinning, MD;   Location: [facility];  Service: Orthopedics;  Laterality: Left;  Left Shoulder Open Distal Clavicle Resection Anterior Acrominectomy and Rotator Cuff Repair  . SPINE SURGERY    . TRANSTHORACIC ECHOCARDIOGRAM  12-26-2008  DR Daneen Schick   NORMAL LVF/  EF  71%/   MILD ASYMMETRIC SEPTAL HYPERTROPHY/ MILD LEFT ATRIAL ENLARGEMENT/ MODERATELY ELEVATED ESTIMATED RIGHT VENTRICULAR SYSTOLIC PRESSURE/ MILD MITRAL  &  TRICUSPID VALVE REGURG.   SOCIAL HISTORY   reports that she quit smoking about 51 years ago. Her smoking use included cigarettes. She has a 30.00 pack-year smoking history. She has never used smokeless tobacco. She reports that she does not drink alcohol or use drugs.  ALLERGIES  No Known Allergies  Family History  Problem Relation Age of Onset  . Hypertension Father   . Hypertension Mother   . Stroke Mother   . Diabetes Sister   . Hypertension Sister   . Cancer Sister   . Arthritis Sister   . Hypertension Brother   . Alcohol abuse Brother   . Hypertension Sister   . Arthritis Sister   . Hypertension Sister   . Arthritis Sister   . Arthritis Sister   . Arthritis Sister   . Heart attack Son   . Colon cancer Neg Hx    Prior to Admission medications   Medication Sig Start Date End Date Taking? Authorizing Provider  acetaminophen (TYLENOL) 500 MG tablet Take 1,500 mg by mouth every 6 (six) hours as needed for mild pain.   Yes [provider]  allopurinol (ZYLOPRIM) 300 MG tablet TAKE 1 TABLET BY MOUTH  DAILY WITH BREAKFAST Patient taking differently: Take 300 mg by mouth daily.  01/28/19  Yes [place], Modena Nunnery, MD  amLODipine (NORVASC) 5 MG tablet TAKE 1 TABLET BY MOUTH  DAILY Patient taking differently: Take 5 mg by mouth daily.  06/09/19  Yes [place], Modena Nunnery, MD  diclofenac sodium (VOLTAREN) 1 % GEL Apply 2 g topically 4 (four) times daily. 04/16/18  Yes [place], Modena Nunnery, MD  dorzolamide-timolol (COSOPT) 22.3-6.8 MG/ML ophthalmic solution Place 1 drop into the right eye 2  (two) times daily.   Yes [provider]  furosemide (LASIX) 40 MG tablet TAKE ONE-HALF TABLET BY  MOUTH DAILY Patient taking differently: Take 20 mg by mouth daily.  01/28/19  Yes [place], Modena Nunnery, MD  hydrALAZINE (APRESOLINE) 10 MG tablet TAKE 1 TABLET BY MOUTH  TWICE DAILY Patient taking differently: Take 10 mg by mouth 2 (two) times daily.  06/09/19  Yes [place], Modena Nunnery, MD  omeprazole (PRILOSEC) 40 MG capsule TAKE 1 CAPSULE BY MOUTH  DAILY FOR REFLUX Patient taking differently: Take 40 mg by mouth daily.  01/28/19  Yes [place], Modena Nunnery, MD  pravastatin (PRAVACHOL) 20 MG tablet TAKE 1 TABLET BY MOUTH IN  THE EVENING Patient taking differently: Take 20 mg by mouth daily.  06/10/19  Yes [place],  Modena Nunnery, MD  QUEtiapine (SEROQUEL) 25 MG tablet Take 25 mg by mouth at bedtime. 06/29/19  Yes [provider]  timolol (BETIMOL) 0.5 % ophthalmic solution Place 1 drop into the left eye daily.   Yes [provider]  Vitamin D, Ergocalciferol, (DRISDOL) 1.25 MG (50000 UNIT) CAPS capsule Take 1 capsule by mouth once a week. 06/23/19  Yes [provider]   Physical Exam: Vitals:   07/06/19 1530 07/06/19 1531 07/06/19 1545 07/06/19 1546  BP: 123/72 123/72  (!) 121/55  Pulse:  87 79 82  Resp: 18 20 19 20   Temp:  97.8 F (36.6 C)  97.7 F (36.5 C)  TempSrc:  Oral  Oral  SpO2:   97% 97%   Constitutional: WN/WD, NAD and appears calm and comfortable Eyes: PERRL, lids and conjunctivae normal, sclerae anicteric  ENMT: External Ears, Nose appear normal. Grossly normal hearing. Mucous membranes are moist. Posterior pharynx clear of any exudate or lesions. Normal dentition.  Neck: Appears normal, supple, no cervical masses, normal ROM, no appreciable thyromegaly Respiratory: Clear to auscultation bilaterally, no wheezing, rales, rhonchi or crackles. Normal respiratory effort and patient is not tachypenic. No accessory muscle use.  Cardiovascular: RRR, no murmurs / rubs /  gallops. S1 and S2 auscultated. No extremity edema. 2+ pedal pulses. No carotid bruits.  Abdomen: Soft, non-tender, non-distended. No masses palpated. No appreciable hepatosplenomegaly. Bowel sounds positive.  GU: Deferred. Musculoskeletal: No clubbing / cyanosis of digits/nails. No joint deformity upper and lower extremities. Good ROM, no contractures. Normal strength and muscle tone.  Skin: No rashes, lesions, ulcers. No induration; Warm and dry.  Neurologic: CN 2-12 grossly intact with no focal deficits. Sensation intact in all 4 Extremities, DTR normal. Strength 5/5 in all 4. Romberg sign cerebellar reflexes not assessed.  Psychiatric: Normal judgment and insight. Alert and oriented x 3. Normal mood and appropriate affect.    Labs on Admission: I have personally reviewed following labs and imaging studies  CBC: Recent Labs  Lab 07/04/19 0630 07/06/19 1304  WBC 9.3 12.6*  HGB 8.5* 7.0*  HCT 28.0* 23.0*  MCV 97.6 97.5  PLT 253 [id number]   Basic Metabolic Panel: Recent Labs  Lab 07/06/19 1304  NA 143  K 4.4  CL 107  CO2 22  GLUCOSE 157*  BUN 58*  CREATININE 4.82*  CALCIUM 9.3   GFR: Estimated Creatinine Clearance: 7.5 mL/min (A) (by C-G formula based on SCr of 4.82 mg/dL (H)). Liver Function Tests: No results for input(s): AST, ALT, ALKPHOS, BILITOT, PROT, ALBUMIN in the last 168 hours. No results for input(s): LIPASE, AMYLASE in the last 168 hours. No results for input(s): AMMONIA in the last 168 hours. Coagulation Profile: Recent Labs  Lab 07/04/19 0630  INR 1.1   Cardiac Enzymes: No results for input(s): CKTOTAL, CKMB, CKMBINDEX, TROPONINI in the last 168 hours. BNP (last 3 results) No results for input(s): PROBNP in the last 8760 hours. HbA1C: No results for input(s): HGBA1C in the last 72 hours. CBG: Recent Labs  Lab 07/04/19 0631  GLUCAP 125*   Lipid Profile: No results for input(s): CHOL, HDL, LDLCALC, TRIG, CHOLHDL, LDLDIRECT in the last 72  hours. Thyroid Function Tests: No results for input(s): TSH, T4TOTAL, FREET4, T3FREE, THYROIDAB in the last 72 hours. Anemia Panel: No results for input(s): VITAMINB12, FOLATE, FERRITIN, TIBC, IRON, RETICCTPCT in the last 72 hours. Urine analysis:    Component Value Date/Time   COLORURINE YELLOW 06/24/2019 [place] 06/24/2019 1718  LABSPEC 1.013 06/24/2019 1718   PHURINE 6.0 06/24/2019 1718   GLUCOSEU NEGATIVE 06/24/2019 1718   HGBUR LARGE (A) 06/24/2019 1718   BILIRUBINUR NEGATIVE 06/24/2019 1718   BILIRUBINUR neg 05/18/2019 1357   KETONESUR NEGATIVE 06/24/2019 1718   PROTEINUR >=300 (A) 06/24/2019 1718   UROBILINOGEN negative (A) 05/18/2019 1357   UROBILINOGEN 0.2 07/19/2008 1547   NITRITE NEGATIVE 06/24/2019 1718   LEUKOCYTESUR NEGATIVE 06/24/2019 1718   Sepsis Labs: !!!!!!!!!!!!!!!!!!!!!!!!!!!!!!!!!!!!!!!!!!!! @LABRCNTIP (procalcitonin:4,lacticidven:4) ) Recent Results (from the past 240 hour(s))  Respiratory Panel by RT PCR (Flu A&B, Covid) - Nasopharyngeal Swab     Status: None   Collection Time: 07/06/19  1:36 PM   Specimen: Nasopharyngeal Swab  Result Value Ref Range Status   SARS Coronavirus 2 by RT PCR NEGATIVE NEGATIVE Final    Comment: (NOTE) SARS-CoV-2 target nucleic acids are NOT DETECTED. The SARS-CoV-2 RNA is generally detectable in upper respiratoy specimens during the acute phase of infection. The lowest concentration of SARS-CoV-2 viral copies this assay can detect is 131 copies/mL. A negative result does not preclude SARS-Cov-2 infection and should not be used as the sole basis for treatment or other patient management decisions. A negative result may occur with  improper specimen collection/handling, submission of specimen other than nasopharyngeal swab, presence of viral mutation(s) within the areas targeted by this assay, and inadequate number of viral copies (<131 copies/mL). A negative result must be combined with  clinical observations, patient history, and epidemiological information. The expected result is Negative. Fact Sheet for Patients:  PinkCheek.be Fact Sheet for Healthcare Providers:  GravelBags.it This test is not yet ap proved or cleared by the [place] FDA and  has been authorized for detection and/or diagnosis of SARS-CoV-2 by FDA under an Emergency Use Authorization (EUA). This EUA will remain  in effect (meaning this test can be used) for the duration of the COVID-19 declaration under Section 564(b)(1) of the Act, 21 U.S.C. section 360bbb-3(b)(1), unless the authorization is terminated or revoked sooner.    Influenza A by PCR NEGATIVE NEGATIVE Final   Influenza B by PCR NEGATIVE NEGATIVE Final    Comment: (NOTE) The Xpert Xpress SARS-CoV-2/FLU/RSV assay is intended as an aid in  the diagnosis of influenza from Nasopharyngeal swab specimens and  should not be used as a sole basis for treatment. Nasal washings and  aspirates are unacceptable for Xpert Xpress SARS-CoV-2/FLU/RSV  testing. Fact Sheet for Patients: PinkCheek.be Fact Sheet for Healthcare Providers: GravelBags.it This test is not yet approved or cleared by the [place] FDA and  has been authorized for detection and/or diagnosis of SARS-CoV-2 by  FDA under an Emergency Use Authorization (EUA). This EUA will remain  in effect (meaning this test can be used) for the duration of the  Covid-19 declaration under Section 564(b)(1) of the Act, 21  U.S.C. section 360bbb-3(b)(1), unless the authorization is  terminated or revoked. Performed at [facility], [place] [address], [place], [place] [address]     Radiological Exams on Admission: CT ABDOMEN PELVIS WO CONTRAST  Result Date: 07/06/2019 CLINICAL DATA:  Gross hematuria status post renal biopsy. EXAM: CT ABDOMEN AND PELVIS WITHOUT CONTRAST  TECHNIQUE: Multidetector CT imaging of the abdomen and pelvis was performed following the standard protocol without IV contrast. COMPARISON:  May 12, 2019. FINDINGS: Lower chest: No acute abnormality. Hepatobiliary: No focal liver abnormality is seen. No gallstones, gallbladder wall thickening, or biliary dilatation. Pancreas: Unremarkable. No pancreatic ductal dilatation or surrounding inflammatory changes. Spleen: Normal in size without  focal abnormality. Adrenals/Urinary Tract: Adrenal glands appear normal. Nonobstructive right nephrolithiasis is noted, including 16 x 14 mm staghorn type calculus in lower pole calyx. There is noted moderate size perinephric hematoma around the lower pole of left kidney. Mild left hydroureteronephrosis is noted without definite obstructing calculus. Large amount of blood clot is noted in the dependent portion of the urinary bladder. Stomach/Bowel: Stomach appears normal. There is no evidence of bowel obstruction or inflammation. Sigmoid diverticulosis is noted without inflammation. Vascular/Lymphatic: Aortic atherosclerosis. No enlarged abdominal or pelvic lymph nodes. Reproductive: Status post hysterectomy. No adnexal masses. Other: No abdominal wall hernia or abnormality. No abdominopelvic ascites. Musculoskeletal: Multilevel degenerative disc disease is noted in the lower lumbar spine. No acute osseous abnormality is noted. IMPRESSION: Moderate sized perinephric hematoma is seen around the lower pole of left kidney, with mild left hydroureteronephrosis present without obstructing calculus. Large amount of blood clot is noted in the lumen of the urinary bladder. Nonobstructive right nephrolithiasis, including large staghorn type calculus in lower pole collecting system. Sigmoid diverticulosis without inflammation. Aortic Atherosclerosis (ICD10-I70.0). Electronically Signed   By: Marijo Conception M.D.   On: 07/06/2019 14:24   DG Chest 2 View  Result Date:  07/06/2019 CLINICAL DATA:  Shortness of breath, left flank pain following left renal biopsy EXAM: CHEST - 2 VIEW COMPARISON:  06/23/2018 FINDINGS: Cardiac shadow is enlarged but stable. Aortic calcifications are seen. Previously seen patchy infiltrates have nearly completely resolved. Bibasilar atelectatic changes are noted. No sizable effusion is seen. No bony abnormality is noted. IMPRESSION: Bibasilar atelectasis. Previously seen infiltrates have nearly completely resolved. Electronically Signed   By: Inez Catalina M.D.   On: 07/06/2019 12:27   EKG: Independently reviewed. Showed a Sinus Rhythm with a Rate of 86 and qTC of 416   Assessment/Plan Active Problems:   Hematuria  Gross Hematuria with Clots in the setting of a Post Renal Bx Complication associated with Perinephric Hematoma -Place in Observation Telemetry -CT Abd/Pelvis showed "Moderate sized perinephric hematoma is seen around the lower pole of left kidney, with mild left hydroureteronephrosis present without obstructing calculus. Large amount of blood clot is noted in the lumen of the urinary bladder. Nonobstructive right nephrolithiasis, including large staghorn type calculus in lower pole collecting system. Sigmoid diverticulosis without inflammation. Aortic Atherosclerosis." -Underwent Renal Bx on 07/04/19 -Check U/A, Urine Cx, and Blood Cx to r/o Infectious Source  -Place Foley 6 [place] and Irrigate per Urology Recc's  -Hb/Hct dropped and on Admission was 7.0/23.0 and down from 8.5/28.0 so she was Typed and Screened and will be Transfused 1 unit of pRBC's; Monitor for additional Fluids given Hx of Diastolic CHF -IR to Evaluate for Further Management and per IR if she continues to have bleeding is unable to achieve hemodynamic stability they may consider proceeding with an angiogram/embolization however they would like to avoid off all possible given her comorbidities and her renal function -Continue to Monitor and Trend  CBC  Symptomatic Anemia/ABLA from Above in the setting of Anemia of Chronic Kidney Disease -In the Setting of Above and Gross hematuria  -As above Hb/Hct is now 7.0/23.0 -Has been more fatigued and dyspenic and improving since starting blood -PT/OT to Evaluate and Treat -Repeat CBC in Am  -Continue to Monitor for S/Sx of Bleeding   AKI on CKD Stage 5 -Sees Nephrology Dr. Claris Gower who recommended Renal Bx for Worsening Renal Fxn -Avoid Nephrotoxic Medications and Hold Lasix and Allopurinol -Likely worsened in the setting of Renal Blood Loss and Hematuria -  Baseline Cr has been worsening for the last several years and last year was admitted to the and this year in January was around 3.2 and has acutely worsened to 4.82 -Avoid nephrotoxic medications, contrast dyes, hypotension and renally adjust medications -Currently getting 1 unit of PRBCs -Repeat CMP in a.m.  Leukocytosis -Likely Reactive -WBC on Admission was 12.6 -Continue to Monitor and Trend -Repeat CBC in AM   Chronic Diastolic CHF -Appears to have some mild Leg edema but appears relatively Euvolemic/Mildly Dry -Getting 1 unit of pRBC's  -Strict I's and O's and Daily Weights -Continue to Monitor for S/Sx of Volume Overload  Glaucoma -C/w Dorzolamide-Timolol 22.3-6.8 mg/mL 1 drop Right Eye BID -C/w Timolol 0.5% 1 drop into the Left Eye  HLD -C/w Pravastatin 20 mg po Every Evening   GERD -C/w Omeprazole 40 mg po Daily Subsitution with Pantoprazole 40 mg po Daily   Gout -Hold Allopurinol 300 mg po Daily given Worsened Renal FXN  Hypertension -Continue with hydralazine 10 mg p.o. twice daily -Currently holding amlodipine 5 mg p.o. daily -Continue monitor blood pressures carefully and avoid hypotension   DVT prophylaxis: SCDs 2/2 to Hematuria Code Status: FULL CODE as patient wishes to be FULL CODE  Family Communication: No family present at bedside Disposition Plan: Pending Improvement of Hematuria and evaluation  by IR as well as PT/OT Consults called: Interventional Radiology; Discussed case with Urology Dr. Remo Lipps Dahl[place]hdt  Admission status: Observation Telemetry  Severity of Illness: The appropriate patient status for this patient is OBSERVATION. Observation status is judged to be reasonable and necessary in order to provide the required intensity of service to ensure the patient's safety. The patient's presenting symptoms, physical exam findings, and initial radiographic and laboratory data in the context of their medical condition is felt to place them at decreased risk for further clinical deterioration. Furthermore, it is anticipated that the patient will be medically stable for discharge from the hospital within 2 midnights of admission. The following factors support the patient status of observation.   " The patient's presenting symptoms include Hematuria, Fatigue, Nausea " The physical exam findings include Mild Leg Edema, Slight Tachycardia  " The initial radiographic and laboratory data are concerning for Renal Hematoma and AKI on CKD Stage [place], D.O. [facility] PAGER is on [place]  If 7PM-7AM, please contact night-coverage [email or web address]  07/06/2019, 4:27 PM

## 2019-07-06 NOTE — Telephone Encounter (Signed)
Pts daughter [name] called to report the pt had a kidney biopsy on Monday 07/04/19. She came home the same day very fatigued and slept the rest of the day. She has not eaten well since and today has developed vomiting. She has had increased bilateral ankle edema and is SOB with minimal exertion. The pt has also developed profuse vaginal bleeding since yesterday. Her biopsy site is sore and tender to touch...no drainage. Pt does not appear to have a fever, no c/o chills. Pt has the urge to urinate but when she tries to void it just "trickles" out. No pain, burning or blood noted per her daughter. Pt is very weak.   I have urged her to call the MD that did the biopsy to alert them of her developing symptoms and to call EMS or take her to the ED for assessment asap.   She agrees and says she will take her now.

## 2019-07-06 NOTE — ED Notes (Signed)
Phlebotomy to bedside to obtain cultures, need to wait to obtain blood until blood transfusion has completed. Urine spec to be obtained after foley placement

## 2019-07-06 NOTE — Progress Notes (Signed)
Paged provider and provider contacted nurse and stated patient may have a size 18, three way catheter placed due to possibility of having bladder irrigation. Urology to consult.

## 2019-07-06 NOTE — Consult Note (Signed)
Chief Complaint: Patient was seen in consultation today for hematuria, hematoma s/p renal biopsy 2/8  Referring Physician(s): Dr. Gilford Rile  Supervising Physician: Corrie Mckusick  Patient Status: [facility] - In-pt  History of Present Illness: [name] is a 86 y.o. female with past medical history of CHF, DM, GERD, HTN, and chronic kidney disease recently with progression to CKD Stage 5.  She was referred to IR for random renal biopsy at the request of her nephrologist.  She underwent procedure 07/04/19 and was noted to have expected hematuria after the biopsy. She was monitored for an extended period of time prior to discharge at which point she demonstrated stability in her vital signs and improvement in her hematuria without clots. She reports she did relatively well yesterday with only a small amount of blood in her urine, however awoke this AM with back pain and increased blood in her urine.  She called her primary doctor who encouraged her to present to the ED.  In the ED, her vital signs have remained stable. Her Hgb decreased from 8.5 to 7.0.  She is receiving 1u PRBCs.   CT Abdomen Pelvis showed: Moderate sized perinephric hematoma is seen around the lower pole of left kidney, with mild left hydroureteronephrosis present without obstructing calculus. Large amount of blood clot is noted in the lumen of the urinary bladder.  Nonobstructive right nephrolithiasis, including large staghorn type calculus in lower pole collecting system.  IR consulted for possible intervention.  Case reviewed by Dr. Pascal Lux.   Past Medical History:  Diagnosis Date  . Arthritis   . Bilateral carpal tunnel syndrome   . Cataracts, both eyes   . Chronic diastolic CHF (congestive heart failure) ([place])    a. Echo (09/2012): Mild LVH, EF 65-70%, moderately elevated RVSP, moderate TR, mild to moderate MR, mild LAE, grade 2 diastolic dysfunction  . Diabetes mellitus ORAL MED  . Diabetic neuropathy (HCC) both  hands and legs  . GERD (gastroesophageal reflux disease)   . Glaucoma   . Gout   . History of echocardiogram    Echo 1/19:  EF 65-70, no RWMA, Gr 2 DD, mild MR, severe LAE, PASP 62   . History of falling RECENT FALL 1 WK AGO--   NO INJURY  . 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 History of transfusion   . Hyperlipidemia   . Hypertension   . Mitral regurgitation   . PUD (peptic ulcer disease)    many yrs ago  . Right leg weakness   . Rotator cuff tear    left  . Slipped intervertebral disc   . Walker as ambulation aid     Past Surgical History:  Procedure Laterality Date  . ABDOMINAL HYSTERECTOMY  1966  . APPENDECTOMY    . CARDIOVASCULAR STRESS TEST  06-18-2005   DR Daneen Schick   NORMAL STUDY/ NO EVIDENCE ISCHEMIA/ EF 75%  . CARPAL TUNNEL RELEASE  09/17/2011   Procedure: CARPAL TUNNEL RELEASE;  Surgeon: Magnus Sinning, MD;  Location: [place];  Service: Orthopedics;  Laterality: Right;  . CARPAL TUNNEL RELEASE  10/22/2011   Procedure: CARPAL TUNNEL RELEASE;  Surgeon: Magnus Sinning, MD;  Location: [place];  Service: Orthopedics;  Laterality: Left;  . EYE SURGERY    . LUMBAR LAMINECTOMY/DECOMPRESSION MICRODISCECTOMY  01/14/2012   Procedure: LUMBAR LAMINECTOMY/DECOMPRESSION MICRODISCECTOMY 3 LEVELS;  Surgeon: Magnus Sinning, MD;  Location: [facility];  Service:  Orthopedics;  Laterality: N/A;  Decompression Laminectomy of L2 - L3, L3 - L4 and L4 - L5 Central  (X-Ray)  . SHOULDER OPEN ROTATOR CUFF REPAIR  06/02/2012   Procedure: ROTATOR CUFF REPAIR SHOULDER OPEN;  Surgeon: Magnus Sinning, MD;  Location: [facility];  Service: Orthopedics;  Laterality: Left;  Left Shoulder Open Distal Clavicle Resection Anterior Acrominectomy and Rotator Cuff Repair  . SPINE SURGERY    . TRANSTHORACIC ECHOCARDIOGRAM  12-26-2008  DR Daneen Schick   NORMAL LVF/  EF  71%/   MILD ASYMMETRIC SEPTAL HYPERTROPHY/ MILD LEFT ATRIAL ENLARGEMENT/ MODERATELY  ELEVATED ESTIMATED RIGHT VENTRICULAR SYSTOLIC PRESSURE/ MILD MITRAL  &  TRICUSPID VALVE REGURG.    Allergies: Patient has no known allergies.  Medications: Prior to Admission medications   Medication Sig Start Date End Date Taking? Authorizing Provider  acetaminophen (TYLENOL) 500 MG tablet Take 1,500 mg by mouth every 6 (six) hours as needed for mild pain.   Yes [provider]  allopurinol (ZYLOPRIM) 300 MG tablet TAKE 1 TABLET BY MOUTH  DAILY WITH BREAKFAST Patient taking differently: Take 300 mg by mouth daily.  01/28/19  Yes [place], Modena Nunnery, MD  amLODipine (NORVASC) 5 MG tablet TAKE 1 TABLET BY MOUTH  DAILY Patient taking differently: Take 5 mg by mouth daily.  06/09/19  Yes [place], Modena Nunnery, MD  diclofenac sodium (VOLTAREN) 1 % GEL Apply 2 g topically 4 (four) times daily. 04/16/18  Yes [place], Modena Nunnery, MD  dorzolamide-timolol (COSOPT) 22.3-6.8 MG/ML ophthalmic solution Place 1 drop into the right eye 2 (two) times daily.   Yes [provider]  furosemide (LASIX) 40 MG tablet TAKE ONE-HALF TABLET BY  MOUTH DAILY Patient taking differently: Take 20 mg by mouth daily.  01/28/19  Yes [place], Modena Nunnery, MD  hydrALAZINE (APRESOLINE) 10 MG tablet TAKE 1 TABLET BY MOUTH  TWICE DAILY Patient taking differently: Take 10 mg by mouth 2 (two) times daily.  06/09/19  Yes [place], Modena Nunnery, MD  omeprazole (PRILOSEC) 40 MG capsule TAKE 1 CAPSULE BY MOUTH  DAILY FOR REFLUX Patient taking differently: Take 40 mg by mouth daily.  01/28/19  Yes [place], Modena Nunnery, MD  pravastatin (PRAVACHOL) 20 MG tablet TAKE 1 TABLET BY MOUTH IN  THE EVENING Patient taking differently: Take 20 mg by mouth daily.  06/10/19  Yes [place], Modena Nunnery, MD  QUEtiapine (SEROQUEL) 25 MG tablet Take 25 mg by mouth at bedtime. 06/29/19  Yes [provider]  timolol (BETIMOL) 0.5 % ophthalmic solution Place 1 drop into the left eye daily.   Yes [provider]  Vitamin D, Ergocalciferol, (DRISDOL) 1.25  MG (50000 UNIT) CAPS capsule Take 1 capsule by mouth once a week. 06/23/19  Yes [provider]     Family History  Problem Relation Age of Onset  . Hypertension Father   . Hypertension Mother   . Stroke Mother   . Diabetes Sister   . Hypertension Sister   . Cancer Sister   . Arthritis Sister   . Hypertension Brother   . Alcohol abuse Brother   . Hypertension Sister   . Arthritis Sister   . Hypertension Sister   . Arthritis Sister   . Arthritis Sister   . Arthritis Sister   . Heart attack Son   . Colon cancer Neg Hx     Social History   Socioeconomic History  . Marital status: Widowed    Spouse name: Not on file  . Number of children: Not  on file  . Years of education: Not on file  . Highest education level: Not on file  Occupational History  . Not on file  Tobacco Use  . Smoking status: Former Smoker    Packs/day: 1.00    Years: 30.00    Pack years: 30.00    Types: Cigarettes    Quit date: 05/28/1968    Years since quitting: 51.1  . Smokeless tobacco: Never Used  Substance and Sexual Activity  . Alcohol use: No  . Drug use: No  . Sexual activity: Not on file  Other Topics Concern  . Not on file  Social History Narrative  . Not on file   Social Determinants of Health   Financial Resource Strain:   . Difficulty of Paying Living Expenses: Not on file  Food Insecurity:   . Worried About Charity fundraiser in the Last Year: Not on file  . Ran Out of Food in the Last Year: Not on file  Transportation Needs:   . Lack of Transportation (Medical): Not on file  . Lack of Transportation (Non-Medical): Not on file  Physical Activity:   . Days of Exercise per Week: Not on file  . Minutes of Exercise per Session: Not on file  Stress:   . Feeling of Stress : Not on file  Social Connections:   . Frequency of Communication with Friends and Family: Not on file  . Frequency of Social Gatherings with Friends and Family: Not on file  . Attends Religious  Services: Not on file  . Active Member of Clubs or Organizations: Not on file  . Attends Archivist Meetings: Not on file  . Marital Status: Not on file     Review of Systems: A 12 point ROS discussed and pertinent positives are indicated in the HPI above.  All other systems are negative.  Review of Systems  Constitutional: Positive for fatigue. Negative for fever.  Respiratory: Positive for shortness of breath. Negative for cough.   Cardiovascular: Negative for chest pain.  Gastrointestinal: Negative for abdominal pain, diarrhea, nausea and vomiting.  Genitourinary: Positive for hematuria (with clots).  Musculoskeletal: Positive for back pain.  Psychiatric/Behavioral: Negative for behavioral problems and confusion.    Vital Signs: BP (!) 121/55   Pulse 82   Temp 97.7 F (36.5 C) (Oral)   Resp 20   SpO2 97%   Physical Exam Vitals and nursing note reviewed.  Constitutional:      General: She is not in acute distress.    Appearance: Normal appearance. She is not ill-appearing.  HENT:     Mouth/Throat:     Mouth: Mucous membranes are moist.     Pharynx: Oropharynx is clear.  Cardiovascular:     Rate and Rhythm: Normal rate and regular rhythm.  Pulmonary:     Effort: Pulmonary effort is normal. No respiratory distress.     Breath sounds: Normal breath sounds.  Abdominal:     General: Abdomen is flat. There is no distension.     Palpations: Abdomen is soft.  Skin:    General: Skin is warm and dry.  Neurological:     General: No focal deficit present.     Mental Status: She is alert and oriented to person, place, and time. Mental status is at baseline.  Psychiatric:        Mood and Affect: Mood normal.        Behavior: Behavior normal.        Thought Content:  Thought content normal.        Judgment: Judgment normal.     Imaging: CT ABDOMEN PELVIS WO CONTRAST  Result Date: 07/06/2019 CLINICAL DATA:  Gross hematuria status post renal biopsy. EXAM: CT  ABDOMEN AND PELVIS WITHOUT CONTRAST TECHNIQUE: Multidetector CT imaging of the abdomen and pelvis was performed following the standard protocol without IV contrast. COMPARISON:  May 12, 2019. FINDINGS: Lower chest: No acute abnormality. Hepatobiliary: No focal liver abnormality is seen. No gallstones, gallbladder wall thickening, or biliary dilatation. Pancreas: Unremarkable. No pancreatic ductal dilatation or surrounding inflammatory changes. Spleen: Normal in size without focal abnormality. Adrenals/Urinary Tract: Adrenal glands appear normal. Nonobstructive right nephrolithiasis is noted, including 16 x 14 mm staghorn type calculus in lower pole calyx. There is noted moderate size perinephric hematoma around the lower pole of left kidney. Mild left hydroureteronephrosis is noted without definite obstructing calculus. Large amount of blood clot is noted in the dependent portion of the urinary bladder. Stomach/Bowel: Stomach appears normal. There is no evidence of bowel obstruction or inflammation. Sigmoid diverticulosis is noted without inflammation. Vascular/Lymphatic: Aortic atherosclerosis. No enlarged abdominal or pelvic lymph nodes. Reproductive: Status post hysterectomy. No adnexal masses. Other: No abdominal wall hernia or abnormality. No abdominopelvic ascites. Musculoskeletal: Multilevel degenerative disc disease is noted in the lower lumbar spine. No acute osseous abnormality is noted. IMPRESSION: Moderate sized perinephric hematoma is seen around the lower pole of left kidney, with mild left hydroureteronephrosis present without obstructing calculus. Large amount of blood clot is noted in the lumen of the urinary bladder. Nonobstructive right nephrolithiasis, including large staghorn type calculus in lower pole collecting system. Sigmoid diverticulosis without inflammation. Aortic Atherosclerosis (ICD10-I70.0). Electronically Signed   By: Marijo Conception M.D.   On: 07/06/2019 14:24   DG Chest 2  View  Result Date: 07/06/2019 CLINICAL DATA:  Shortness of breath, left flank pain following left renal biopsy EXAM: CHEST - 2 VIEW COMPARISON:  06/23/2018 FINDINGS: Cardiac shadow is enlarged but stable. Aortic calcifications are seen. Previously seen patchy infiltrates have nearly completely resolved. Bibasilar atelectatic changes are noted. No sizable effusion is seen. No bony abnormality is noted. IMPRESSION: Bibasilar atelectasis. Previously seen infiltrates have nearly completely resolved. Electronically Signed   By: Inez Catalina M.D.   On: 07/06/2019 12:27   CT Head Wo Contrast  Result Date: 06/28/2019 CLINICAL DATA:  Posterior headache, increasing confusion and hallucinations for 1 month. EXAM: CT HEAD WITHOUT CONTRAST TECHNIQUE: Contiguous axial images were obtained from the base of the skull through the vertex without intravenous contrast. COMPARISON:  Brain MRI 02/05/1999. FINDINGS: Brain: No evidence of acute infarction, hemorrhage, hydrocephalus, extra-axial collection or mass lesion/mass effect. Vascular: No hyperdense vessel or unexpected calcification. Skull: No fracture or focal lesion. Sinuses/Orbits: Negative. Other: None. IMPRESSION: Negative head CT. Electronically Signed   By: Inge Rise M.D.   On: 06/28/2019 12:59   DG Chest Port 1 View  Result Date: 06/24/2019 CLINICAL DATA:  Chills. EXAM: PORTABLE CHEST 1 VIEW COMPARISON:  November 04, 2017 FINDINGS: Mild diffuse infiltrates are noted. There is no evidence of a pleural effusion or pneumothorax. The cardiac silhouette is mildly enlarged. There is marked severity calcification of the aortic arch. Degenerative changes are seen within the thoracic spine. IMPRESSION: 1. Mild diffuse bilateral infiltrates. Electronically Signed   By: Virgina Norfolk M.D.   On: 06/24/2019 15:48   US BIOPSY (KIDNEY)  Result Date: 07/04/2019 INDICATION: Worsening renal function. Please perform random renal biopsy for tissue diagnostic purposes. EXAM:  ULTRASOUND  GUIDED RENAL BIOPSY COMPARISON:  Noncontrast CT abdomen and pelvis-05/12/2019; renal ultrasound-05/03/2019 MEDICATIONS: None. ANESTHESIA/SEDATION: Fentanyl 25 mcg IV; Versed 0.5 mg IV Total Moderate Sedation time: 10 minutes; The patient was continuously monitored during the procedure by the interventional radiology nurse under my direct supervision. COMPLICATIONS: SIR Level A - No therapy, no consequence. Procedure complicated by development of a small left-sided perinephric hematoma as well as development of hematuria following the procedure. PROCEDURE: Informed written consent was obtained from the patient after a discussion of the risks, benefits and alternatives to treatment. The patient understands and consents the procedure. A timeout was performed prior to the initiation of the procedure. Ultrasound scanning was performed of the bilateral flanks. The inferior pole of the left kidney was selected for biopsy due to location and sonographic window. The procedure was planned. The operative site was prepped and draped in the usual sterile fashion. The overlying soft tissues were anesthetized with 1% lidocaine with epinephrine. A 17 gauge core needle biopsy device was advanced into the inferior cortex of the left kidney and 3 core biopsies were obtained under direct ultrasound guidance. Images were saved for documentation purposes. The biopsy device was removed and hemostasis was obtained with manual compression. Post procedural scanning was negative for significant post procedural hemorrhage or additional complication. A dressing was placed. The patient tolerated the procedure well without immediate post procedural complication. IMPRESSION: Technically successful ultrasound guided left renal biopsy. Procedure complicated by development of a small perinephric hematoma as well as postprocedural hematuria. The patient remained hemodynamically stable with improvement, near resolution of postoperative  hematuria following prolonged observation the short-stay unit prior to discharge. Electronically Signed   By: Sandi Mariscal M.D.   On: 07/04/2019 11:45    Labs:  CBC: Recent Labs    06/24/19 1546 06/28/19 1149 07/04/19 0630 07/06/19 1304  WBC 8.3 8.1 9.3 12.6*  HGB 8.0* 8.5* 8.5* 7.0*  HCT 26.8* 26.9* 28.0* 23.0*  PLT 243 261 253 235    COAGS: Recent Labs    07/04/19 0630  INR 1.1    BMP: Recent Labs    06/09/19 1128 06/09/19 1128 06/13/19 1126 06/13/19 1126 06/20/19 0853 06/24/19 1546 06/28/19 1149 07/06/19 1304  NA 141   < > 141   < > 142 144 144 143  K 5.8*   < > 5.0   < > 4.6 4.4 4.8 4.4  CL 114*   < > 111*   < > 110 114* 109 107  CO2 19*   < > 22   < > 21 22 25 22   GLUCOSE 106*   < > 120*   < > 92 148* 132* 157*  BUN 45*   < > 43*   < > 47* 44* 50* 58*  CALCIUM 9.4   < > 9.2   < > 9.1 8.9 9.5 9.3  CREATININE 3.41*   < > 3.21*   < > 3.27* 3.07* 4.05* 4.82*  GFRNONAA 11*  --  12*  --   --  13*  --  7*  GFRAA 13*  --  14*  --   --  14*  --  8*   < > = values in this interval not displayed.    LIVER FUNCTION TESTS: Recent Labs    11/17/18 0959 04/27/19 1126 06/09/19 1128 06/28/19 1149  BILITOT 0.3 0.3 0.3 0.3  AST 13 10 12 12   ALT 6 3* 7 5*  PROT 6.8 7.0 6.5 6.8    TUMOR MARKERS:  No results for input(s): AFPTM, CEA, CA199, CHROMGRNA in the last 8760 hours.  Assessment and Plan: Perinephric hematoma, large blood clot within the bladder Patient admitted with with hematuria, peripnehric hematoma with associated drop in HgB.  She is currently being resusitated with fluids and 1u PRBCs.  Her vital signs are stable. She is not in distress.  If she continues to have bleeding and is unable to achieve hemodynamic stability, may consider proceeding with angiogram/embolization, however would like to avoid if at all possible given her commodities and renal failure.  Patient updated at bedside.  She is agreeable to plan as currently outlined.  All questions  answered.   IR to follow. Trend H/H. Monitor for improvement in hematuria. Monitor vital signs.   Thank you for this interesting consult.  I greatly enjoyed meeting [name] and look forward to participating in their care.  A copy of this report was sent to the requesting provider on this date.  Electronically Signed: Docia Barrier, PA 07/06/2019, 4:11 PM   I spent a total of 20 Minutes    in face to face in clinical consultation, greater than 50% of which was counseling/coordinating care for hematuria.

## 2019-07-07 DIAGNOSIS — Y848 Other medical procedures as the cause of abnormal reaction of the patient, or of later complication, without mention of misadventure at the time of the procedure: Secondary | ICD-10-CM | POA: Diagnosis present

## 2019-07-07 DIAGNOSIS — D62 Acute posthemorrhagic anemia: Secondary | ICD-10-CM | POA: Diagnosis present

## 2019-07-07 DIAGNOSIS — E1121 Type 2 diabetes mellitus with diabetic nephropathy: Secondary | ICD-10-CM | POA: Diagnosis present

## 2019-07-07 DIAGNOSIS — I132 Hypertensive heart and chronic kidney disease with heart failure and with stage 5 chronic kidney disease, or end stage renal disease: Secondary | ICD-10-CM | POA: Diagnosis present

## 2019-07-07 DIAGNOSIS — R319 Hematuria, unspecified: Secondary | ICD-10-CM | POA: Diagnosis not present

## 2019-07-07 DIAGNOSIS — H409 Unspecified glaucoma: Secondary | ICD-10-CM | POA: Diagnosis present

## 2019-07-07 DIAGNOSIS — Z8261 Family history of arthritis: Secondary | ICD-10-CM | POA: Diagnosis not present

## 2019-07-07 DIAGNOSIS — I10 Essential (primary) hypertension: Secondary | ICD-10-CM | POA: Diagnosis not present

## 2019-07-07 DIAGNOSIS — Z8673 Personal history of transient ischemic attack (TIA), and cerebral infarction without residual deficits: Secondary | ICD-10-CM | POA: Diagnosis not present

## 2019-07-07 DIAGNOSIS — I340 Nonrheumatic mitral (valve) insufficiency: Secondary | ICD-10-CM | POA: Diagnosis present

## 2019-07-07 DIAGNOSIS — N184 Chronic kidney disease, stage 4 (severe): Secondary | ICD-10-CM

## 2019-07-07 DIAGNOSIS — M109 Gout, unspecified: Secondary | ICD-10-CM | POA: Diagnosis present

## 2019-07-07 DIAGNOSIS — K859 Acute pancreatitis without necrosis or infection, unspecified: Secondary | ICD-10-CM | POA: Insufficient documentation

## 2019-07-07 DIAGNOSIS — N185 Chronic kidney disease, stage 5: Secondary | ICD-10-CM | POA: Diagnosis present

## 2019-07-07 DIAGNOSIS — Z79899 Other long term (current) drug therapy: Secondary | ICD-10-CM | POA: Diagnosis not present

## 2019-07-07 DIAGNOSIS — Z9181 History of falling: Secondary | ICD-10-CM | POA: Diagnosis not present

## 2019-07-07 DIAGNOSIS — Z20822 Contact with and (suspected) exposure to covid-19: Secondary | ICD-10-CM | POA: Diagnosis present

## 2019-07-07 DIAGNOSIS — E119 Type 2 diabetes mellitus without complications: Secondary | ICD-10-CM | POA: Diagnosis not present

## 2019-07-07 DIAGNOSIS — Z8711 Personal history of peptic ulcer disease: Secondary | ICD-10-CM | POA: Diagnosis not present

## 2019-07-07 DIAGNOSIS — E1122 Type 2 diabetes mellitus with diabetic chronic kidney disease: Secondary | ICD-10-CM | POA: Diagnosis present

## 2019-07-07 DIAGNOSIS — I5032 Chronic diastolic (congestive) heart failure: Secondary | ICD-10-CM | POA: Diagnosis not present

## 2019-07-07 DIAGNOSIS — Z791 Long term (current) use of non-steroidal anti-inflammatories (NSAID): Secondary | ICD-10-CM | POA: Diagnosis not present

## 2019-07-07 DIAGNOSIS — N99840 Postprocedural hematoma of a genitourinary system organ or structure following a genitourinary system procedure: Secondary | ICD-10-CM | POA: Diagnosis present

## 2019-07-07 DIAGNOSIS — N179 Acute kidney failure, unspecified: Secondary | ICD-10-CM | POA: Diagnosis present

## 2019-07-07 DIAGNOSIS — E1142 Type 2 diabetes mellitus with diabetic polyneuropathy: Secondary | ICD-10-CM | POA: Diagnosis present

## 2019-07-07 DIAGNOSIS — E785 Hyperlipidemia, unspecified: Secondary | ICD-10-CM | POA: Diagnosis present

## 2019-07-07 DIAGNOSIS — K219 Gastro-esophageal reflux disease without esophagitis: Secondary | ICD-10-CM | POA: Diagnosis present

## 2019-07-07 DIAGNOSIS — Z8249 Family history of ischemic heart disease and other diseases of the circulatory system: Secondary | ICD-10-CM | POA: Diagnosis not present

## 2019-07-07 DIAGNOSIS — Z90710 Acquired absence of both cervix and uterus: Secondary | ICD-10-CM | POA: Diagnosis not present

## 2019-07-07 DIAGNOSIS — N1339 Other hydronephrosis: Secondary | ICD-10-CM | POA: Diagnosis present

## 2019-07-07 DIAGNOSIS — R310 Gross hematuria: Secondary | ICD-10-CM | POA: Diagnosis not present

## 2019-07-07 LAB — BASIC METABOLIC PANEL
Anion gap: 10 (ref 5–15)
BUN: 59 mg/dL — ABNORMAL HIGH (ref 8–23)
CO2: 24 mmol/L (ref 22–32)
Calcium: 8.8 mg/dL — ABNORMAL LOW (ref 8.9–10.3)
Chloride: 108 mmol/L (ref 98–111)
Creatinine, Ser: 4.64 mg/dL — ABNORMAL HIGH (ref 0.44–1.00)
GFR calc Af Amer: 9 mL/min — ABNORMAL LOW (ref >60)
GFR calc non Af Amer: 8 mL/min — ABNORMAL LOW (ref >60)
Glucose, Bld: 99 mg/dL (ref 70–99)
Potassium: 4.4 mmol/L (ref 3.5–5.1)
Sodium: 142 mmol/L (ref 135–145)

## 2019-07-07 LAB — COMPREHENSIVE METABOLIC PANEL
ALT: 9 U/L (ref 0–44)
AST: 12 U/L — ABNORMAL LOW (ref 15–41)
Albumin: 2.8 g/dL — ABNORMAL LOW (ref 3.5–5.0)
Alkaline Phosphatase: 105 U/L (ref 38–126)
Anion gap: 9 (ref 5–15)
BUN: 58 mg/dL — ABNORMAL HIGH (ref 8–23)
CO2: 24 mmol/L (ref 22–32)
Calcium: 8.6 mg/dL — ABNORMAL LOW (ref 8.9–10.3)
Chloride: 109 mmol/L (ref 98–111)
Creatinine, Ser: 4.76 mg/dL — ABNORMAL HIGH (ref 0.44–1.00)
GFR calc Af Amer: 9 mL/min — ABNORMAL LOW (ref >60)
GFR calc non Af Amer: 7 mL/min — ABNORMAL LOW (ref >60)
Glucose, Bld: 128 mg/dL — ABNORMAL HIGH (ref 70–99)
Potassium: 4.4 mmol/L (ref 3.5–5.1)
Sodium: 142 mmol/L (ref 135–145)
Total Bilirubin: 0.8 mg/dL (ref 0.3–1.2)
Total Protein: 5.5 g/dL — ABNORMAL LOW (ref 6.5–8.1)

## 2019-07-07 LAB — CBC
HCT: 23.4 % — ABNORMAL LOW (ref 36.0–46.0)
HCT: 22.3 % — ABNORMAL LOW (ref 36.0–46.0)
Hemoglobin: 7.4 g/dL — ABNORMAL LOW (ref 12.0–15.0)
Hemoglobin: 7.1 g/dL — ABNORMAL LOW (ref 12.0–15.0)
MCH: 29.7 pg (ref 26.0–34.0)
MCH: 30 pg (ref 26.0–34.0)
MCHC: 31.6 g/dL (ref 30.0–36.0)
MCHC: 31.8 g/dL (ref 30.0–36.0)
MCV: 94 fL (ref 80.0–100.0)
MCV: 94.1 fL (ref 80.0–100.0)
Platelets: 183 10*3/uL (ref 150–400)
Platelets: 210 10*3/uL (ref 150–400)
RBC: 2.49 MIL/uL — ABNORMAL LOW (ref 3.87–5.11)
RBC: 2.37 MIL/uL — ABNORMAL LOW (ref 3.87–5.11)
RDW: 17.3 % — ABNORMAL HIGH (ref 11.5–15.5)
RDW: 17.4 % — ABNORMAL HIGH (ref 11.5–15.5)
WBC: 16.4 10*3/uL — ABNORMAL HIGH (ref 4.0–10.5)
WBC: 14.9 10*3/uL — ABNORMAL HIGH (ref 4.0–10.5)
nRBC: 0 % (ref 0.0–0.2)
nRBC: 0 % (ref 0.0–0.2)

## 2019-07-07 LAB — PREPARE RBC (CROSSMATCH)

## 2019-07-07 MED ORDER — CHLORHEXIDINE GLUCONATE CLOTH 2 % EX PADS
6.0000 | MEDICATED_PAD | Freq: Every day | CUTANEOUS | Status: DC
  Administered 2019-07-07 – 2019-07-11 (×5): 6 via TOPICAL

## 2019-07-07 MED ORDER — SODIUM CHLORIDE 0.9% IV SOLUTION: Freq: Once | INTRAVENOUS | Status: AC

## 2019-07-07 MED ORDER — SODIUM CHLORIDE 0.9 % IV SOLN: INTRAVENOUS | Status: AC

## 2019-07-07 NOTE — Progress Notes (Signed)
Chief Complaint: Patient was seen today for follow up hematuria after renal biopsy  Supervising Physician: Corrie Mckusick  Patient Status: [facility] - In-pt  Subjective: Pt currently sitting on bedpan. Foley has been placed for possible irrigation. Pt still having gross dark hematuria.  Objective: Physical Exam: BP 123/62 (BP Location: Right Arm)   Pulse 80   Temp 98.2 F (36.8 C) (Oral)   Resp 18   SpO2 95%  Dark bloody urine in foley bag.  (L)flank puncture site clean dry, NT  Current Facility-Administered Medications:  .  0.9 %  sodium chloride infusion, 10 mL/hr, Intravenous, Once, Sheikh, Omair Latif, DO .  acetaminophen (TYLENOL) tablet 650 mg, 650 mg, Oral, Q6H PRN **OR** acetaminophen (TYLENOL) suppository 650 mg, 650 mg, Rectal, Q6H PRN, Sheikh, Omair Latif, DO .  bisacodyl (DULCOLAX) suppository 10 mg, 10 mg, Rectal, Daily PRN, Raiford Noble Latif, DO .  Chlorhexidine Gluconate Cloth 2 % PADS 6 each, 6 each, Topical, Daily, Raiford Noble [place], [place], 6 each at 07/07/19 0941 .  dorzolamide-timolol (COSOPT) 22.3-6.8 MG/ML ophthalmic solution 1 drop, 1 drop, Right Eye, BID, Raiford Noble [place], [place], 1 drop at 07/07/19 0944 .  hydrALAZINE (APRESOLINE) tablet 10 mg, 10 mg, Oral, BID, Raiford Noble [place], DO, 10 mg at 07/07/19 0944 .  ondansetron (ZOFRAN) tablet 4 mg, 4 mg, Oral, Q6H PRN **OR** ondansetron (ZOFRAN) injection 4 mg, 4 mg, Intravenous, Q6H PRN, Sheikh, Omair Latif, DO .  pantoprazole (PROTONIX) EC tablet 40 mg, 40 mg, Oral, Daily, Raiford Noble [place], DO, 40 mg at 07/06/19 [id number] .  polyethylene glycol (MIRALAX / GLYCOLAX) packet 17 g, 17 g, Oral, Daily PRN, Sheikh, Omair Latif, DO .  pravastatin (PRAVACHOL) tablet 20 mg, 20 mg, Oral, QPM, Sheikh, Omair [place], DO, 20 mg at 07/06/19 [id number] .  QUEtiapine (SEROQUEL) tablet 25 mg, 25 mg, Oral, QHS, Sheikh, Georgina Quint Arnold, DO, 25 mg at 07/06/19 2114 .  sodium chloride flush (NS) 0.9 % injection 3 mL, 3 mL, Intravenous, Once,  Sheikh, Omair Latif, DO .  timolol (TIMOPTIC) 0.5 % ophthalmic solution 1 drop, 1 drop, Left Eye, Daily, [place], Yujing Z, RPH, 1 drop at 07/06/19 2115 .  traMADol (ULTRAM) tablet 50 mg, 50 mg, Oral, Q6H PRN, Raiford Noble [place], DO  Labs: CBC Recent Labs    07/07/19 0136 07/07/19 0820  WBC 16.4* 14.9*  HGB 7.4* 7.1*  HCT 23.4* 22.3*  PLT 183 210   BMET Recent Labs    07/07/19 0136 07/07/19 0820  NA 142 142  K 4.4 4.4  CL 109 108  CO2 24 24  GLUCOSE 128* 99  BUN 58* 59*  CREATININE 4.76* 4.64*  CALCIUM 8.6* 8.8*   LFT Recent Labs    07/07/19 0136  PROT 5.5*  ALBUMIN 2.8*  AST 12*  ALT 9  ALKPHOS 105  BILITOT 0.8   PT/INR No results for input(s): LABPROT, INR in the last 72 hours.   Studies/Results: CT ABDOMEN PELVIS WO CONTRAST  Result Date: 07/06/2019 CLINICAL DATA:  Gross hematuria status post renal biopsy. EXAM: CT ABDOMEN AND PELVIS WITHOUT CONTRAST TECHNIQUE: Multidetector CT imaging of the abdomen and pelvis was performed following the standard protocol without IV contrast. COMPARISON:  May 12, 2019. FINDINGS: Lower chest: No acute abnormality. Hepatobiliary: No focal liver abnormality is seen. No gallstones, gallbladder wall thickening, or biliary dilatation. Pancreas: Unremarkable. No pancreatic ductal dilatation or surrounding inflammatory changes. Spleen: Normal in size without focal abnormality. Adrenals/Urinary Tract: Adrenal glands appear normal. Nonobstructive right nephrolithiasis  is noted, including 16 x 14 mm staghorn type calculus in lower pole calyx. There is noted moderate size perinephric hematoma around the lower pole of left kidney. Mild left hydroureteronephrosis is noted without definite obstructing calculus. Large amount of blood clot is noted in the dependent portion of the urinary bladder. Stomach/Bowel: Stomach appears normal. There is no evidence of bowel obstruction or inflammation. Sigmoid diverticulosis is noted without  inflammation. Vascular/Lymphatic: Aortic atherosclerosis. No enlarged abdominal or pelvic lymph nodes. Reproductive: Status post hysterectomy. No adnexal masses. Other: No abdominal wall hernia or abnormality. No abdominopelvic ascites. Musculoskeletal: Multilevel degenerative disc disease is noted in the lower lumbar spine. No acute osseous abnormality is noted. IMPRESSION: Moderate sized perinephric hematoma is seen around the lower pole of left kidney, with mild left hydroureteronephrosis present without obstructing calculus. Large amount of blood clot is noted in the lumen of the urinary bladder. Nonobstructive right nephrolithiasis, including large staghorn type calculus in lower pole collecting system. Sigmoid diverticulosis without inflammation. Aortic Atherosclerosis (ICD10-I70.0). Electronically Signed   By: Marijo Conception M.D.   On: 07/06/2019 14:24   DG Chest 2 View  Result Date: 07/06/2019 CLINICAL DATA:  Shortness of breath, left flank pain following left renal biopsy EXAM: CHEST - 2 VIEW COMPARISON:  06/23/2018 FINDINGS: Cardiac shadow is enlarged but stable. Aortic calcifications are seen. Previously seen patchy infiltrates have nearly completely resolved. Bibasilar atelectatic changes are noted. No sizable effusion is seen. No bony abnormality is noted. IMPRESSION: Bibasilar atelectasis. Previously seen infiltrates have nearly completely resolved. Electronically Signed   By: Inez Catalina M.D.   On: 07/06/2019 12:27    Assessment/Plan: Hematuria after left renal biopsy on 2/8 BP and HR stable Hgb stable at 7.1 Looks like old dark hematuria, foley placed for possible irrigation. IR will cont to foley. Does not appear to have active bleeding.    LOS: 0 days   I spent a total of 15 minutes in face to face in clinical consultation, greater than 50% of which was counseling/coordinating care for hematuria after renal biopsy  Ascencion Dike PA-C 07/07/2019 10:23 AM

## 2019-07-07 NOTE — Progress Notes (Addendum)
Progress Note    [name]  [id number] DOB: 1924-03-26  DOA: 07/06/2019 PCP: Alycia Rossetti, MD    Brief Narrative:   Chief complaint: Hematuria/fatigue/nausea  Medical records reviewed and are as summarized below:  [name] is very pleasant 86 y.o. female with past medical history that includes chronic diastolic heart failure, diabetes type 2 complicated by neuropathy diet controlled, GERD, glaucoma, CVA, peptic ulcer disease, hypertension chronic kidney disease stage II who underwent a renal biopsy 3 days ago for worsening renal function presented to the emergency department February 10 with chief complaint of hematuria and fatigue.  She was admitted for symptomatic anemia and gross hematuria.  Assessment/Plan:   Principal Problem:   Hematuria Active Problems:   Chronic diastolic CHF (congestive heart failure) (HCC)   CKD (chronic kidney disease) stage 4, GFR 15-29 ml/min (HCC)   Symptomatic anemia   Essential hypertension   Hyperlipidemia   Peripheral neuropathy   #1.  Hematuria with clots in the setting of post renal biopsy.  Imaging reveals moderate sized perinephric hematoma around lower pole of left kidney with mild left hydroureteronephrosis without obstructing calculus and large blood clot noted in the lumen of the bladder.  Instructions given for Foley irrigation yesterday and this was not done.  600 cc of bloody urine drained during the night.  She continues to drain dark urine.  Evaluated by interventional radiology who opine if she continues to bleed may consider angiogram/embolization however given her comorbidities and renal function would like to avoid.  -Irrigate Foley -Gentle IV fluids -Monitor intake and output -Further management per interventional radiology  #2.  Symptomatic anemia/acute blood loss anemia in the setting of chronic disease and #1.  She was transfused 1 L of packed red blood cells yesterday and Hg 7.1 down from 7.4 post  transfusion yesterday. Given continued hematuria and little gain from transfusion yesterday consider transfusion -Transfuse 1 unit of packed red blood cells -Monitor CBC -See #1  #3.  Acute kidney injury on chronic kidney disease stage III.  Creatinine stable at 4.6 range.  Of note chart review indicates there is been a gradual worsening of kidney function over the last several years however creatinine was 3.2 in January of this year and worsened to 4.8 thus prompting the renal biopsy -Gentle IV fluids as noted above -Hold nephrotoxins. -Monitor intake and output  #4.  Leukocytosis.  Likely reactive.  WBC is trending down today.  She remains afebrile hemodynamically stable and nontoxic-appearing -Monitor  #5.  Chronic diastolic heart failure.  Appears compensated.  Echo 2 years ago reveals normal EF grade 1 diastolic dysfunction.  Home medications include low-dose Lasix -Holding Lasix for now -Daily weights -Monitor intake and output -Low threshold for resuming Lasix given need for gentle IV fluids and transfusion  #6.  Hypertension.  Controlled.  Home medications include Lasix and amlodipine.  Lasix and amlodipine on hold for now -Continue to hold home meds -As needed hydralazine -Monitor closely  #7.  Glaucoma.  Stable at baseline -Continue home meds  #8.  Gout.  No flares at this time.  Home medications include allopurinol which is on hold for now -Continue to hold allopurinol  #9.  Hyperlipidemia.  Home meds include statin -Continue home meds    Family Communication/Anticipated D/C date and plan/Code Status   DVT prophylaxis: Lovenox ordered. Code Status: Full Code.  Family Communication: patient at bedside and daughter on phone Disposition Plan: from home with daughter, d/c when hgb stable  and bleeding has stopped.    Medical Consultants:   IR     Subjective:   Awake alert eating breakfast smiling.  Denies pain or discomfort  Objective:    Vitals:    07/06/19 1911 07/06/19 2328 07/07/19 0514 07/07/19 1025  BP: 136/60 (!) 126/56 123/62   Pulse: 91 79 80   Resp: 16 17 18    Temp: 98 F (36.7 C) 98.1 F (36.7 C) 98.2 F (36.8 C)   TempSrc: Oral Oral Oral   SpO2: 100% 94% 95%   Height:    5\' 3"  (1.6 m)    Intake/Output Summary (Last 24 hours) at 07/07/2019 1039 Last data filed at 07/07/2019 1024 Gross per 24 hour  Intake 528.33 ml  Output 671 ml  Net -142.67 ml   There were no vitals filed for this visit.  Exam: General: Well-nourished awake alert calm cooperative very pleasant CV: Regular rate and rhythm no murmur gallop or rub trace lower extremity edema Respiratory: No increased work of breathing breath sounds are distant but clear bilaterally to auscultation hear no wheezes no crackles Abdomen: Nondistended soft positive bowel sounds throughout nontender to palpation no guarding or rebounding.  Foley draining very dark red urine no clots noted Musculoskeletal: Joints without swelling/erythema full range of motion moves extremities spontaneously  Data Reviewed:   I have personally reviewed following labs and imaging studies:  Labs: Labs show the following:   Basic Metabolic Panel: Recent Labs  Lab 07/06/19 1304 07/06/19 1304 07/07/19 0136 07/07/19 0820  NA 143  --  142 142  K 4.4   < > 4.4 4.4  CL 107  --  109 108  CO2 22  --  24 24  GLUCOSE 157*  --  128* 99  BUN 58*  --  58* 59*  CREATININE 4.82*  --  4.76* 4.64*  CALCIUM 9.3  --  8.6* 8.8*   < > = values in this interval not displayed.   GFR Estimated Creatinine Clearance: 7.8 mL/min (A) (by C-G formula based on SCr of 4.64 mg/dL (H)). Liver Function Tests: Recent Labs  Lab 07/07/19 0136  AST 12*  ALT 9  ALKPHOS 105  BILITOT 0.8  PROT 5.5*  ALBUMIN 2.8*   No results for input(s): LIPASE, AMYLASE in the last 168 hours. No results for input(s): AMMONIA in the last 168 hours. Coagulation profile Recent Labs  Lab 07/04/19 0630  INR 1.1     CBC: Recent Labs  Lab 07/04/19 0630 07/06/19 1304 07/07/19 0136 07/07/19 0820  WBC 9.3 12.6* 16.4* 14.9*  HGB 8.5* 7.0* 7.4* 7.1*  HCT 28.0* 23.0* 23.4* 22.3*  MCV 97.6 97.5 94.0 94.1  PLT 253 235 183 210   Cardiac Enzymes: No results for input(s): CKTOTAL, CKMB, CKMBINDEX, TROPONINI in the last 168 hours. BNP (last 3 results) No results for input(s): PROBNP in the last 8760 hours. CBG: Recent Labs  Lab 07/04/19 0631  GLUCAP 125*   D-Dimer: No results for input(s): DDIMER in the last 72 hours. Hgb A1c: No results for input(s): HGBA1C in the last 72 hours. Lipid Profile: No results for input(s): CHOL, HDL, LDLCALC, TRIG, CHOLHDL, LDLDIRECT in the last 72 hours. Thyroid function studies: Recent Labs    07/06/19 1746  TSH 0.912   Anemia work up: No results for input(s): VITAMINB12, FOLATE, FERRITIN, TIBC, IRON, RETICCTPCT in the last 72 hours. Sepsis Labs: Recent Labs  Lab 07/04/19 0630 07/06/19 1304 07/07/19 0136 07/07/19 0820  WBC 9.3 12.6* 16.4* 14.9*  Microbiology Recent Results (from the past 240 hour(s))  Respiratory Panel by RT PCR (Flu A&B, Covid) - Nasopharyngeal Swab     Status: None   Collection Time: 07/06/19  1:36 PM   Specimen: Nasopharyngeal Swab  Result Value Ref Range Status   SARS Coronavirus 2 by RT PCR NEGATIVE NEGATIVE Final    Comment: (NOTE) SARS-CoV-2 target nucleic acids are NOT DETECTED. The SARS-CoV-2 RNA is generally detectable in upper respiratoy specimens during the acute phase of infection. The lowest concentration of SARS-CoV-2 viral copies this assay can detect is 131 copies/mL. A negative result does not preclude SARS-Cov-2 infection and should not be used as the sole basis for treatment or other patient management decisions. A negative result may occur with  improper specimen collection/handling, submission of specimen other than nasopharyngeal swab, presence of viral mutation(s) within the areas targeted by this  assay, and inadequate number of viral copies (<131 copies/mL). A negative result must be combined with clinical observations, patient history, and epidemiological information. The expected result is Negative. Fact Sheet for Patients:  PinkCheek.be Fact Sheet for Healthcare Providers:  GravelBags.it This test is not yet ap proved or cleared by the [place] FDA and  has been authorized for detection and/or diagnosis of SARS-CoV-2 by FDA under an Emergency Use Authorization (EUA). This EUA will remain  in effect (meaning this test can be used) for the duration of the COVID-19 declaration under Section 564(b)(1) of the Act, 21 U.S.C. section 360bbb-3(b)(1), unless the authorization is terminated or revoked sooner.    Influenza A by PCR NEGATIVE NEGATIVE Final   Influenza B by PCR NEGATIVE NEGATIVE Final    Comment: (NOTE) The Xpert Xpress SARS-CoV-2/FLU/RSV assay is intended as an aid in  the diagnosis of influenza from Nasopharyngeal swab specimens and  should not be used as a sole basis for treatment. Nasal washings and  aspirates are unacceptable for Xpert Xpress SARS-CoV-2/FLU/RSV  testing. Fact Sheet for Patients: PinkCheek.be Fact Sheet for Healthcare Providers: GravelBags.it This test is not yet approved or cleared by the [place] FDA and  has been authorized for detection and/or diagnosis of SARS-CoV-2 by  FDA under an Emergency Use Authorization (EUA). This EUA will remain  in effect (meaning this test can be used) for the duration of the  Covid-19 declaration under Section 564(b)(1) of the Act, 21  U.S.C. section 360bbb-3(b)(1), unless the authorization is  terminated or revoked. Performed at [facility], [place] [address] [place], [place] [address]     Procedures and diagnostic studies:  CT ABDOMEN PELVIS WO CONTRAST  Result Date:  07/06/2019 CLINICAL DATA:  Gross hematuria status post renal biopsy. EXAM: CT ABDOMEN AND PELVIS WITHOUT CONTRAST TECHNIQUE: Multidetector CT imaging of the abdomen and pelvis was performed following the standard protocol without IV contrast. COMPARISON:  May 12, 2019. FINDINGS: Lower chest: No acute abnormality. Hepatobiliary: No focal liver abnormality is seen. No gallstones, gallbladder wall thickening, or biliary dilatation. Pancreas: Unremarkable. No pancreatic ductal dilatation or surrounding inflammatory changes. Spleen: Normal in size without focal abnormality. Adrenals/Urinary Tract: Adrenal glands appear normal. Nonobstructive right nephrolithiasis is noted, including 16 x 14 mm staghorn type calculus in lower pole calyx. There is noted moderate size perinephric hematoma around the lower pole of left kidney. Mild left hydroureteronephrosis is noted without definite obstructing calculus. Large amount of blood clot is noted in the dependent portion of the urinary bladder. Stomach/Bowel: Stomach appears normal. There is no evidence of bowel obstruction or inflammation. Sigmoid diverticulosis  is noted without inflammation. Vascular/Lymphatic: Aortic atherosclerosis. No enlarged abdominal or pelvic lymph nodes. Reproductive: Status post hysterectomy. No adnexal masses. Other: No abdominal wall hernia or abnormality. No abdominopelvic ascites. Musculoskeletal: Multilevel degenerative disc disease is noted in the lower lumbar spine. No acute osseous abnormality is noted. IMPRESSION: Moderate sized perinephric hematoma is seen around the lower pole of left kidney, with mild left hydroureteronephrosis present without obstructing calculus. Large amount of blood clot is noted in the lumen of the urinary bladder. Nonobstructive right nephrolithiasis, including large staghorn type calculus in lower pole collecting system. Sigmoid diverticulosis without inflammation. Aortic Atherosclerosis (ICD10-I70.0).  Electronically Signed   By: Marijo Conception M.D.   On: 07/06/2019 14:24   DG Chest 2 View  Result Date: 07/06/2019 CLINICAL DATA:  Shortness of breath, left flank pain following left renal biopsy EXAM: CHEST - 2 VIEW COMPARISON:  06/23/2018 FINDINGS: Cardiac shadow is enlarged but stable. Aortic calcifications are seen. Previously seen patchy infiltrates have nearly completely resolved. Bibasilar atelectatic changes are noted. No sizable effusion is seen. No bony abnormality is noted. IMPRESSION: Bibasilar atelectasis. Previously seen infiltrates have nearly completely resolved. Electronically Signed   By: Inez Catalina M.D.   On: 07/06/2019 12:27    Medications:    Chlorhexidine Gluconate Cloth  6 each Topical Daily   dorzolamide-timolol  1 drop Right Eye BID   hydrALAZINE  10 mg Oral BID   pantoprazole  40 mg Oral Daily   pravastatin  20 mg Oral QPM   QUEtiapine  25 mg Oral QHS   sodium chloride flush  3 mL Intravenous Once   timolol  1 drop Left Eye Daily   Continuous Infusions:  sodium chloride     sodium chloride       LOS: 0 days   Radene Gunning NP  [facility]   How to contact the [facility] Attending or Consulting provider [place] or covering provider during after hours [place], for this patient?  Check the care team in [facility] and look for a) attending/consulting [facility] provider listed and b) the [facility] team listed Log into [email or web address] and use [place]'s universal password to access. If you do not have the password, please contact the hospital operator. Locate the [facility] provider you are looking for under [facility] and page to a number that you can be directly reached. If you still have difficulty reaching the provider, please page the [facility] (Director on Call) for the Hospitalists listed on amion for assistance.  07/07/2019, 10:39 AM

## 2019-07-07 NOTE — Progress Notes (Signed)
Per off-going RN, MD put an order for foley irrigation but per MD again suggested Urologist does the initial one.

## 2019-07-08 LAB — CBC
HCT: 24.3 % — ABNORMAL LOW (ref 36.0–46.0)
HCT: 27.9 % — ABNORMAL LOW (ref 36.0–46.0)
Hemoglobin: 7.8 g/dL — ABNORMAL LOW (ref 12.0–15.0)
Hemoglobin: 8.8 g/dL — ABNORMAL LOW (ref 12.0–15.0)
MCH: 30.1 pg (ref 26.0–34.0)
MCH: 30 pg (ref 26.0–34.0)
MCHC: 32.1 g/dL (ref 30.0–36.0)
MCHC: 31.5 g/dL (ref 30.0–36.0)
MCV: 93.8 fL (ref 80.0–100.0)
MCV: 95.2 fL (ref 80.0–100.0)
Platelets: 174 10*3/uL (ref 150–400)
Platelets: 207 10*3/uL (ref 150–400)
RBC: 2.59 MIL/uL — ABNORMAL LOW (ref 3.87–5.11)
RBC: 2.93 MIL/uL — ABNORMAL LOW (ref 3.87–5.11)
RDW: 17.1 % — ABNORMAL HIGH (ref 11.5–15.5)
RDW: 17.2 % — ABNORMAL HIGH (ref 11.5–15.5)
WBC: 10.1 10*3/uL (ref 4.0–10.5)
WBC: 11.2 10*3/uL — ABNORMAL HIGH (ref 4.0–10.5)
nRBC: 0 % (ref 0.0–0.2)
nRBC: 0 % (ref 0.0–0.2)

## 2019-07-08 LAB — TYPE AND SCREEN
ABO/RH(D): O POS
Antibody Screen: NEGATIVE
Unit division: 0
Unit division: 0

## 2019-07-08 LAB — BPAM RBC
Blood Product Expiration Date: 202102172359
Blood Product Expiration Date: 202103112359
ISSUE DATE / TIME: 202102101531
ISSUE DATE / TIME: 202102111200
Unit Type and Rh: 5100
Unit Type and Rh: 5100

## 2019-07-08 LAB — BASIC METABOLIC PANEL
Anion gap: 13 (ref 5–15)
BUN: 61 mg/dL — ABNORMAL HIGH (ref 8–23)
CO2: 22 mmol/L (ref 22–32)
Calcium: 8.3 mg/dL — ABNORMAL LOW (ref 8.9–10.3)
Chloride: 107 mmol/L (ref 98–111)
Creatinine, Ser: 4.27 mg/dL — ABNORMAL HIGH (ref 0.44–1.00)
GFR calc Af Amer: 10 mL/min — ABNORMAL LOW (ref >60)
GFR calc non Af Amer: 8 mL/min — ABNORMAL LOW (ref >60)
Glucose, Bld: 80 mg/dL (ref 70–99)
Potassium: 4.7 mmol/L (ref 3.5–5.1)
Sodium: 142 mmol/L (ref 135–145)

## 2019-07-08 NOTE — Evaluation (Signed)
Occupational Therapy Evaluation Patient Details Name: [name] MRN: [id number] DOB: 1923-12-21 Today's Date: 07/08/2019    History of Present Illness Pt is a 86 y/o female admitted secondary to shortness of breath and weakness. Found to have hematuria following renal biopsy. Imaging showed L kidney perinephric hematoma. PMH includes dCHF, HTN, glaucoma, CKD, and gout.    Clinical Impression   Pt with decline in function and safety with ADLs and ADL mobility with impaired strength, balance and endurance. Pt lives at home with her daughter, uses a RW for mobility and is independent with ADLs/selfcare. Pt currently requires min guard A with UB ADLs, mod - max A with LB ADLs, mod A with toileting and min - min guard A with mobility using RW. Pt very pleasant and cooperative. Pt would benefit from acute OT services to address impairments to maximize level of function and safety    Follow Up Recommendations  Home health OT;Supervision/Assistance - 24 hour    Equipment Recommendations  None recommended by OT    Recommendations for Other Services       Precautions / Restrictions Precautions Precautions: Fall Restrictions Weight Bearing Restrictions: No      Mobility Bed Mobility Overal bed mobility: Needs Assistance Bed Mobility: Supine to Sit     Supine to sit: Min assist     General bed mobility comments: pt in recliner upon OT arrival. Per PT note, pt min A with bed mobility  Transfers Overall transfer level: Needs assistance Equipment used: Rolling walker (2 wheeled) Transfers: Sit to/from [facility] Sit to Stand: Min assist Stand pivot transfers: Min guard       General transfer comment: Min A for steadying assist to stand. Min guard for safety during transfer to chair.     Balance Overall balance assessment: Needs assistance Sitting-balance support: No upper extremity supported;Feet supported Sitting balance-Leahy Scale: Fair     Standing  balance support: Bilateral upper extremity supported;During functional activity Standing balance-Leahy Scale: Poor Standing balance comment: Reliant on BUE support                           ADL either performed or assessed with clinical judgement   ADL Overall ADL's : Needs assistance/impaired Eating/Feeding: Set up;Independent;Sitting   Grooming: Wash/dry hands;Wash/dry face;Min guard;Sitting   Upper Body Bathing: Min guard;Sitting   Lower Body Bathing: Moderate assistance   Upper Body Dressing : Min guard;Sitting   Lower Body Dressing: Maximal assistance   Toilet Transfer: Minimal assistance;Min guard;Ambulation;RW;BSC;Cueing for safety   Toileting- Clothing Manipulation and Hygiene: Moderate assistance;Sit to/from stand       Functional mobility during ADLs: Minimal assistance;Min guard;Cueing for safety;Rolling walker       Vision Baseline Vision/History: Wears glasses Patient Visual Report: No change from baseline       Perception     Praxis      Pertinent Vitals/Pain Pain Assessment: No/denies pain     Hand Dominance Right   Extremity/Trunk Assessment Upper Extremity Assessment Upper Extremity Assessment: Generalized weakness   Lower Extremity Assessment Lower Extremity Assessment: Defer to PT evaluation   Cervical / Trunk Assessment Cervical / Trunk Assessment: Normal   Communication Communication Communication: No difficulties   Cognition Arousal/Alertness: Awake/alert Behavior During Therapy: WFL for tasks assessed/performed Overall Cognitive Status: Within Functional Limits for tasks assessed  General Comments  Pt concerned that she is still bleeding. Reports she does not want to go home too early. Educated about using WC if pt feeling weaker or more unsteady than normal.     Exercises     Shoulder Instructions      Home Living Family/patient expects to be discharged to::  Private residence Living Arrangements: Children Available Help at Discharge: Family Type of Home: House Home Access: Stairs to enter [employer] of Steps: has chair lift Entrance Stairs-Rails: Right;Left Home Layout: One level     Bathroom Shower/Tub: Teacher, early years/pre: [place]: Environmental consultant - 4 wheels;Bedside commode;Shower seat;Wheelchair - manual          Prior Functioning/Environment Level of Independence: Needs assistance  Gait / Transfers Assistance Needed: Reports using a walker for ambulation, however, reports sometimes uses WC for mobility.  ADL's / Homemaking Assistance Needed: Reports daughter occasionally assists with ADL tasks. Otherwise is independent.    Comments: pt states that she like sto get into the tub for bathing        OT Problem List: Decreased strength;Impaired balance (sitting and/or standing);Decreased activity tolerance;Decreased knowledge of use of DME or AE      OT Treatment/Interventions: Self-care/ADL training;DME and/or AE instruction;Therapeutic activities;Balance training;Patient/family education;Therapeutic exercise    OT Goals(Current goals can be found in the care plan section) Acute Rehab OT Goals Patient Stated Goal: no more bleeding to go home OT Goal Formulation: With patient Time For Goal Achievement: 07/22/19 Potential to Achieve Goals: Good ADL Goals Pt Will Perform Grooming: with min guard assist;with supervision;with set-up;standing Pt Will Perform Upper Body Bathing: with supervision;with set-up;sitting Pt Will Perform Lower Body Bathing: with min assist;sitting/lateral leans;sit to/from stand;with caregiver independent in assisting Pt Will Perform Upper Body Dressing: with supervision;with set-up;sitting Pt Will Perform Lower Body Dressing: with mod assist;sitting/lateral leans;sit to/from stand;with caregiver independent in assisting Pt Will Transfer to Toilet: with min guard  assist;with supervision;ambulating Pt Will Perform Toileting - Clothing Manipulation and hygiene: with min assist;with min guard assist;sit to/from stand Pt Will Perform Tub/Shower Transfer: with min guard assist;ambulating;rolling walker;tub bench;3 in 1  OT Frequency: Min 2X/week   Barriers to D/C:            Co-evaluation              AM-PAC OT "6 Clicks" Daily Activity     Outcome Measure Help from another person eating meals?: None Help from another person taking care of personal grooming?: A Little Help from another person toileting, which includes using toliet, bedpan, or urinal?: A Lot Help from another person bathing (including washing, rinsing, drying)?: A Lot Help from another person to put on and taking off regular upper body clothing?: A Little Help from another person to put on and taking off regular lower body clothing?: A Lot 6 Click Score: 16   End of Session Equipment Utilized During Treatment: Gait belt;Rolling walker;Other (comment)(BSC)  Activity Tolerance: Patient tolerated treatment well Patient left: in chair;with call bell/phone within reach  OT Visit Diagnosis: Unsteadiness on feet (R26.81);Muscle weakness (generalized) (M62.81)                Time: 1339-1410 OT Time Calculation (min): 31 min Charges:  OT General Charges $OT Visit: 1 Visit OT Evaluation $OT Eval Low Complexity: 1 Low OT Treatments $Self Care/Home Management : 8-22 mins    Britt Bottom 07/08/2019, 2:50 PM

## 2019-07-08 NOTE — TOC Initial Note (Addendum)
Transition of Care [facility]) - Initial/Assessment Note    Patient Details  Name: [name] MRN: [id number] Date of Birth: July 18, 1923  Transition of Care [facility]) CM/SW Contact:    Marilu Favre, RN Phone Number: 07/08/2019, 2:46 PM  Clinical Narrative:                  Patient from home with daughter Rodney Cruise [id number]. Patient asked NCM to call [facility]. Explained PT recommendation of HHPT and 24/7 assistance at discharge to both patient and Rodney Cruise, both voiced understanding. Rodney Cruise is able to provide 24/7 assistance.   Provided Medicare.gov list. They asked NCM to call patient's grand daughter Sondos Pio ( TOC at [facility]) . NCM called Karry Barrilleaux [facility]. Discussed above. Nixon Kolton's first choice is [facility], second [employer].   Left a message for Mateo Flow at [facility]. Awaiting call back will need orders.  Spoke to [place] with [facility]. Unable to take due to staffing. Called Tommi Rumps with [facility] he accepted referral. Rozanna Boer daughter Nira Conn aware. Expected Discharge Plan: [place] Barriers to Discharge: Continued Medical Work up   Patient Goals and CMS Choice Patient states their goals for this hospitalization and ongoing recovery are:: to go home CMS Medicare.gov Compare Post Acute Care list provided to:: Patient Choice offered to / list presented to : Patient  Expected Discharge Plan and Services Expected Discharge Plan: [place]   Discharge Planning Services: CM Consult Post Acute Care Choice: [place] arrangements for the past 2 months: Single Family Home                 DME Arranged: N/A         HH Arranged: PT          Prior Living Arrangements/Services Living arrangements for the past 2 months: Single Family Home Lives with:: Adult Children Patient language and need for interpreter reviewed:: Yes        Need for Family Participation in Patient Care: Yes (Comment) Care giver support system in place?: Yes (comment)    Criminal Activity/Legal Involvement Pertinent to Current Situation/Hospitalization: No - Comment as needed  Activities of Daily Living Home Assistive Devices/Equipment: Environmental consultant (specify type), Cane (specify quad or straight) ADL Screening (condition at time of admission) Patient's cognitive ability adequate to safely complete daily activities?: No Is the patient deaf or have difficulty hearing?: No Does the patient have difficulty seeing, even when wearing glasses/contacts?: No Does the patient have difficulty concentrating, remembering, or making decisions?: No Patient able to express need for assistance with ADLs?: No Does the patient have difficulty dressing or bathing?: No Independently performs ADLs?: No Communication: Independent Dressing (OT): Needs assistance Is this a change from baseline?: Pre-admission baseline Grooming: Independent Feeding: Independent Bathing: Independent Toileting: Independent In/Out Bed: Needs assistance Is this a change from baseline?: Pre-admission baseline Walks in Home: Independent with device (comment) Does the patient have difficulty walking or climbing stairs?: Yes Weakness of Legs: None Weakness of Arms/Hands: None  Permission Sought/Granted   Permission granted to share information with : Yes, Verbal Permission Granted  Share Information with NAMERodney Cruise [phone] daughter, Analyse Fruehauf [id number] grand daughter  Permission granted to share info w AGENCY: [place] [facility]        Emotional Assessment Appearance:: Appears stated age Attitude/Demeanor/Rapport: Engaged Affect (typically observed): Accepting Orientation: : Oriented to Self, Oriented to Place, Oriented to  Time, Oriented to Situation Alcohol / Substance Use:  Not Applicable Psych Involvement: No (comment)  Admission diagnosis:  Gross hematuria [R31.0] Hematuria [R31.9] Acute on chronic pancreatitis ([place]) [K85.90, K86.1] Patient Active Problem List    Diagnosis Date Noted  . Hematuria 07/06/2019  . Kidney stones 05/18/2019  . Gross hematuria 05/18/2019  . OA (osteoarthritis) 04/18/2018  . Symptomatic anemia 04/16/2018  . Senile purpura ([place]) 11/30/2017  . Dysphagia 07/28/2016  . Peripheral edema 07/02/2016  . CKD (chronic kidney disease) stage 4, GFR 15-29 ml/min (HCC) 05/30/2015  . GERD (gastroesophageal reflux disease) 05/30/2015  . Peripheral neuropathy 03/19/2015  . Diabetes mellitus, type II ([place]) 02/05/2015  . Spinal stenosis of lumbar region 02/05/2015  . Chronic diastolic CHF (congestive heart failure) ([place]) 05/01/2014  . Essential hypertension 05/01/2014  . Hyperlipidemia 05/01/2014   PCP:  Alycia Rossetti, MD Pharmacy:   [facility], [address] [place] [address] Phone: [phone] Fax: [phone]  [facility], [address] [place] [place] [address] Phone: [phone] Fax: [phone]     Social Determinants of Health ([place]) Interventions    Readmission Risk Interventions No flowsheet data found.

## 2019-07-08 NOTE — Progress Notes (Addendum)
PROGRESS NOTE    [name]  [id number] DOB: 05/22/1924 DOA: 07/06/2019 PCP: Deanna Rossetti, MD     Brief Narrative:  Patient is a 86 year old female with a medical history consisting of diastolic heart failure, type 2 diabetes with neuropathy, GERD, glaucoma, history of CVA, hypertensive chronic kidney disease who presented on 07/06/2019 for blood in her urine and fatigue.  She underwent a renal biopsy 3 days prior to admission due to worsening renal function.  The patient received 1 unit of packed red blood cells in the emergency department on 2/10.  She was admitted for symptomatic anemia and gross hematuria.   New events last 24 hours / Subjective: Patient received another unit of packed red blood cells yesterday, 2/11.  Hemoglobin today 7.8.  She reports feeling well.  There is still blood in her Foley.  Assessment & Plan:   Principal Problem:   Hematuria  Less symptomatic today, she is no longer tachycardic, I will take her off of telemetry  Check a CBC this afternoon to see if she needs to be transfused again  If still having bleeding tomorrow, will obtain renal angiogram; if CBC stable and no blood, will d/c  Active Problems:   Chronic diastolic CHF (congestive heart failure) (HCC)  Hydrate cautiously    Essential hypertension  Hydralazine 10 mg twice daily    Hyperlipidemia  Continue pravastatin 20 mg daily    Glaucoma  Timolol 1 drop left eye daily  Costopt 1 drop right eye twice daily    GERD  Pantoprazole 40 mg daily    Peripheral neuropathy  Tramadol 50 mg every 6 hours as needed    CKD (chronic kidney disease) stage 4, GFR 15-29 ml/min (HCC)  Will need outpatient dialysis    Symptomatic anemia  As above  DVT prophylaxis: SCDs Code Status: Full Family Communication: Self; spoke w [name], her daughter about progress Coming From: Home Disposition Plan: Home w home health PT, SW has already set up Barriers to Discharge: Clinical  improvement  Consultants:   Interventional radiology   Objective: Vitals:   07/07/19 1840 07/07/19 2326 07/08/19 0454 07/08/19 1215  BP:  114/60 118/67 135/64  Pulse:  74 80 75  Resp:  20 20 18   Temp:  98 F (36.7 C) 98.6 F (37 C) 97.8 F (36.6 C)  TempSrc:  Oral Oral Oral  SpO2:  97% 94% 94%  Weight: 82.5 kg  82.1 kg   Height:        Intake/Output Summary (Last 24 hours) at 07/08/2019 1241 Last data filed at 07/07/2019 2331 Gross per 24 hour  Intake 459.88 ml  Output 700 ml  Net -240.12 ml   Filed Weights   07/07/19 1840 07/08/19 0454  Weight: 82.5 kg 82.1 kg    Examination:  General exam: Appears calm and comfortable  Respiratory system: Clear to auscultation. Respiratory effort normal. No respiratory distress. No conversational dyspnea.  Cardiovascular system: S1 & S2 heard, RRR. No murmurs. No pedal edema. Gastrointestinal system: Abdomen is nondistended, soft and nontender. Normal bowel sounds heard. Central nervous system: Alert and oriented. No focal neurological deficits. Speech clear.  Skin: No rashes, lesions or ulcers on exposed skin  Psychiatry: Judgement and insight appear normal. Mood & affect appropriate.   Data Reviewed: I have personally reviewed following labs and imaging studies  CBC: Recent Labs  Lab 07/04/19 0630 07/06/19 1304 07/07/19 0136 07/07/19 0820 07/08/19 0533  WBC 9.3 12.6* 16.4* 14.9* 10.1  HGB 8.5* 7.0*  7.4* 7.1* 7.8*  HCT 28.0* 23.0* 23.4* 22.3* 24.3*  MCV 97.6 97.5 94.0 94.1 93.8  PLT 253 235 183 210 [id number]   Basic Metabolic Panel: Recent Labs  Lab 07/06/19 1304 07/07/19 0136 07/07/19 0820 07/08/19 0533  NA 143 142 142 142  K 4.4 4.4 4.4 4.7  CL 107 109 108 107  CO2 22 24 24 22   GLUCOSE 157* 128* 99 80  BUN 58* 58* 59* 61*  CREATININE 4.82* 4.76* 4.64* 4.27*  CALCIUM 9.3 8.6* 8.8* 8.3*   GFR: Estimated Creatinine Clearance: 8.4 mL/min (A) (by C-G formula based on SCr of 4.27 mg/dL (H)). Liver Function  Tests: Recent Labs  Lab 07/07/19 0136  AST 12*  ALT 9  ALKPHOS 105  BILITOT 0.8  PROT 5.5*  ALBUMIN 2.8*   Coagulation Profile: Recent Labs  Lab 07/04/19 0630  INR 1.1   CBG: Recent Labs  Lab 07/04/19 0631  GLUCAP 125*   Thyroid Function Tests: Recent Labs    07/06/19 1746  TSH 0.912    Recent Results (from the past 240 hour(s))  Respiratory Panel by RT PCR (Flu A&B, Covid) - Nasopharyngeal Swab     Status: None   Collection Time: 07/06/19  1:36 PM   Specimen: Nasopharyngeal Swab  Result Value Ref Range Status   SARS Coronavirus 2 by RT PCR NEGATIVE NEGATIVE Final    Comment: (NOTE) SARS-CoV-2 target nucleic acids are NOT DETECTED. The SARS-CoV-2 RNA is generally detectable in upper respiratoy specimens during the acute phase of infection. The lowest concentration of SARS-CoV-2 viral copies this assay can detect is 131 copies/mL. A negative result does not preclude SARS-Cov-2 infection and should not be used as the sole basis for treatment or other patient management decisions. A negative result may occur with  improper specimen collection/handling, submission of specimen other than nasopharyngeal swab, presence of viral mutation(s) within the areas targeted by this assay, and inadequate number of viral copies (<131 copies/mL). A negative result must be combined with clinical observations, patient history, and epidemiological information. The expected result is Negative. Fact Sheet for Patients:  PinkCheek.be Fact Sheet for Healthcare Providers:  GravelBags.it This test is not yet ap proved or cleared by the [place] FDA and  has been authorized for detection and/or diagnosis of SARS-CoV-2 by FDA under an Emergency Use Authorization (EUA). This EUA will remain  in effect (meaning this test can be used) for the duration of the COVID-19 declaration under Section 564(b)(1) of the Act, 21  U.S.C. section 360bbb-3(b)(1), unless the authorization is terminated or revoked sooner.    Influenza A by PCR NEGATIVE NEGATIVE Final   Influenza B by PCR NEGATIVE NEGATIVE Final    Comment: (NOTE) The Xpert Xpress SARS-CoV-2/FLU/RSV assay is intended as an aid in  the diagnosis of influenza from Nasopharyngeal swab specimens and  should not be used as a sole basis for treatment. Nasal washings and  aspirates are unacceptable for Xpert Xpress SARS-CoV-2/FLU/RSV  testing. Fact Sheet for Patients: PinkCheek.be Fact Sheet for Healthcare Providers: GravelBags.it This test is not yet approved or cleared by the [place] FDA and  has been authorized for detection and/or diagnosis of SARS-CoV-2 by  FDA under an Emergency Use Authorization (EUA). This EUA will remain  in effect (meaning this test can be used) for the duration of the  Covid-19 declaration under Section 564(b)(1) of the Act, 21  U.S.C. section 360bbb-3(b)(1), unless the authorization is  terminated or revoked. Performed at [facility], [address] [place], [place] [address]   Culture, blood (routine x 2)     Status: None (Preliminary result)   Collection Time: 07/06/19  5:46 PM   Specimen: BLOOD LEFT WRIST  Result Value Ref Range Status   Specimen Description BLOOD LEFT WRIST  Final   Special Requests   Final    BOTTLES DRAWN AEROBIC AND ANAEROBIC Blood Culture results may not be optimal due to an inadequate volume of blood received in culture bottles   Culture   Final    NO GROWTH 2 DAYS Performed at [facility], [place] [address] [place], [place] [address]    Report Status PENDING  Incomplete  Culture, blood (routine x 2)     Status: None (Preliminary result)   Collection Time: 07/06/19  5:54 PM   Specimen: BLOOD LEFT HAND  Result Value Ref Range Status   Specimen Description BLOOD LEFT HAND  Final   Special Requests   Final    BOTTLES  DRAWN AEROBIC AND ANAEROBIC Blood Culture results may not be optimal due to an inadequate volume of blood received in culture bottles   Culture   Final    NO GROWTH 2 DAYS Performed at [facility], [place] [address], [place], [place] [address]    Report Status PENDING  Incomplete      Radiology Studies: CT ABDOMEN PELVIS WO CONTRAST  Result Date: 07/06/2019 CLINICAL DATA:  Gross hematuria status post renal biopsy. EXAM: CT ABDOMEN AND PELVIS WITHOUT CONTRAST TECHNIQUE: Multidetector CT imaging of the abdomen and pelvis was performed following the standard protocol without IV contrast. COMPARISON:  May 12, 2019. FINDINGS: Lower chest: No acute abnormality. Hepatobiliary: No focal liver abnormality is seen. No gallstones, gallbladder wall thickening, or biliary dilatation. Pancreas: Unremarkable. No pancreatic ductal dilatation or surrounding inflammatory changes. Spleen: Normal in size without focal abnormality. Adrenals/Urinary Tract: Adrenal glands appear normal. Nonobstructive right nephrolithiasis is noted, including 16 x 14 mm staghorn type calculus in lower pole calyx. There is noted moderate size perinephric hematoma around the lower pole of left kidney. Mild left hydroureteronephrosis is noted without definite obstructing calculus. Large amount of blood clot is noted in the dependent portion of the urinary bladder. Stomach/Bowel: Stomach appears normal. There is no evidence of bowel obstruction or inflammation. Sigmoid diverticulosis is noted without inflammation. Vascular/Lymphatic: Aortic atherosclerosis. No enlarged abdominal or pelvic lymph nodes. Reproductive: Status post hysterectomy. No adnexal masses. Other: No abdominal wall hernia or abnormality. No abdominopelvic ascites. Musculoskeletal: Multilevel degenerative disc disease is noted in the lower lumbar spine. No acute osseous abnormality is noted. IMPRESSION: Moderate sized perinephric hematoma is seen around the lower pole of  left kidney, with mild left hydroureteronephrosis present without obstructing calculus. Large amount of blood clot is noted in the lumen of the urinary bladder. Nonobstructive right nephrolithiasis, including large staghorn type calculus in lower pole collecting system. Sigmoid diverticulosis without inflammation. Aortic Atherosclerosis (ICD10-I70.0). Electronically Signed   By: Marijo Conception M.D.   On: 07/06/2019 14:24     Scheduled Meds: . Chlorhexidine Gluconate Cloth  6 each Topical Daily  . dorzolamide-timolol  1 drop Right Eye BID  . hydrALAZINE  10 mg Oral BID  . pantoprazole  40 mg Oral Daily  . pravastatin  20 mg Oral QPM  . QUEtiapine  25 mg Oral QHS  . sodium chloride flush  3 mL Intravenous Once  . timolol  1 drop Left Eye Daily   Continuous Infusions:   LOS:  1 day    Time spent: 30 minutes   [place], DO [facility] 07/08/2019, 12:41 PM   Available via Epic secure chat 7am-7pm After these hours, please refer to coverage provider listed on [email or web address]

## 2019-07-08 NOTE — Progress Notes (Signed)
Chief Complaint: Patient was seen today for follow up hematuria after renal biopsy  Supervising Physician: Aletta Edouard  Patient Status: [facility] - In-pt  Subjective: Patient on bedside commode.  RN assisting.  Foley remains with dark hematuria. No visible clots or bright red blood.   Objective: Physical Exam: BP 132/67 (BP Location: Right Arm)   Pulse 72   Temp 97.6 F (36.4 C) (Oral)   Resp 16   Ht 5\' 3"  (1.6 m)   Wt 181 lb (82.1 kg)   SpO2 98%   BMI 32.06 kg/m   NAD, alert GU: Foley remains in place, dark hematuria.  No clots or bright red blood.  Back pain improved.   Current Facility-Administered Medications:  .  acetaminophen (TYLENOL) tablet 650 mg, 650 mg, Oral, Q6H PRN **OR** acetaminophen (TYLENOL) suppository 650 mg, 650 mg, Rectal, Q6H PRN, Sheikh, Omair Latif, DO .  bisacodyl (DULCOLAX) suppository 10 mg, 10 mg, Rectal, Daily PRN, Raiford Noble Latif, DO .  Chlorhexidine Gluconate Cloth 2 % PADS 6 each, 6 each, Topical, Daily, Raiford Noble [place], [place], 6 each at 07/08/19 0850 .  dorzolamide-timolol (COSOPT) 22.3-6.8 MG/ML ophthalmic solution 1 drop, 1 drop, Right Eye, BID, Raiford Noble [place], [place], 1 drop at 07/08/19 0849 .  hydrALAZINE (APRESOLINE) tablet 10 mg, 10 mg, Oral, BID, Raiford Noble [place], DO, 10 mg at 07/08/19 0849 .  ondansetron (ZOFRAN) tablet 4 mg, 4 mg, Oral, Q6H PRN **OR** ondansetron (ZOFRAN) injection 4 mg, 4 mg, Intravenous, Q6H PRN, Sheikh, Omair Latif, DO .  pantoprazole (PROTONIX) EC tablet 40 mg, 40 mg, Oral, Daily, Raiford Noble [place], DO, 40 mg at 07/07/19 2022 .  polyethylene glycol (MIRALAX / GLYCOLAX) packet 17 g, 17 g, Oral, Daily PRN, Sheikh, Omair Latif, DO .  pravastatin (PRAVACHOL) tablet 20 mg, 20 mg, Oral, QPM, Sheikh, Omair Latif, DO, 20 mg at 07/08/19 1749 .  QUEtiapine (SEROQUEL) tablet 25 mg, 25 mg, Oral, QHS, Sheikh, Omair [place], DO, 25 mg at 07/07/19 2022 .  sodium chloride flush (NS) 0.9 % injection 3 mL, 3 mL,  Intravenous, Once, Sheikh, Omair Latif, DO .  timolol (TIMOPTIC) 0.5 % ophthalmic solution 1 drop, 1 drop, Left Eye, Daily, [place], Yujing Z, RPH, 1 drop at 07/08/19 0849 .  traMADol (ULTRAM) tablet 50 mg, 50 mg, Oral, Q6H PRN, Raiford Noble [place], DO, 50 mg at 07/07/19 1149  Labs: CBC Recent Labs    07/08/19 0533 07/08/19 1222  WBC 10.1 11.2*  HGB 7.8* 8.8*  HCT 24.3* 27.9*  PLT 174 207   BMET Recent Labs    07/07/19 0820 07/08/19 0533  NA 142 142  K 4.4 4.7  CL 108 107  CO2 24 22  GLUCOSE 99 80  BUN 59* 61*  CREATININE 4.64* 4.27*  CALCIUM 8.8* 8.3*   LFT Recent Labs    07/07/19 0136  PROT 5.5*  ALBUMIN 2.8*  AST 12*  ALT 9  ALKPHOS 105  BILITOT 0.8   PT/INR No results for input(s): LABPROT, INR in the last 72 hours.   Studies/Results: No results found.  Assessment/Plan: Hematuria after left renal biopsy on 2/8 Vital signs remain stable.  Hgb improved to 8.8 Overall improving.  Has not required irrigation thus far.  Continue current management.     LOS: 1 day   I spent a total of 15 minutes in face to face in clinical consultation, greater than 50% of which was counseling/coordinating care for hematuria after renal biopsy  Docia Barrier PA-C  07/08/2019 7:32 PM

## 2019-07-08 NOTE — Evaluation (Signed)
Physical Therapy Evaluation Patient Details Name: [name] MRN: [id number] DOB: 21-Oct-1923 Today's Date: 07/08/2019   History of Present Illness  Pt is a 86 y/o female admitted secondary to shortness of breath and weakness. Found to have hematuria following renal biopsy. Imaging showed L kidney perinephric hematoma. PMH includes dCHF, HTN, glaucoma, CKD, and gout.   Clinical Impression  Pt admitted secondary to problem above with deficits below. Required min to min guard A to transfer to chair using RW. Pt reports weakness has somewhat improved, however, remains feeling weak. Pt reports daughter can assist at home if needed. Reports she has a WC she can use if needed as well. Will continue to follow acutely to maximize functional mobility independence and safety.     Follow Up Recommendations Home health PT;Supervision/Assistance - 24 hour(24/7 initially)    Equipment Recommendations  None recommended by PT    Recommendations for Other Services       Precautions / Restrictions Precautions Precautions: Fall Restrictions Weight Bearing Restrictions: No      Mobility  Bed Mobility Overal bed mobility: Needs Assistance Bed Mobility: Supine to Sit     Supine to sit: Min assist     General bed mobility comments: Min A for trunk elevation.   Transfers Overall transfer level: Needs assistance Equipment used: Rolling walker (2 wheeled) Transfers: Sit to/from [facility] Sit to Stand: Min assist Stand pivot transfers: Min guard       General transfer comment: Min A for steadying assist to stand. Min guard for safety during transfer to chair.   Ambulation/Gait                Stairs            Wheelchair Mobility    Modified Rankin (Stroke Patients Only)       Balance Overall balance assessment: Needs assistance Sitting-balance support: No upper extremity supported;Feet supported Sitting balance-Leahy Scale: Fair     Standing  balance support: Bilateral upper extremity supported;During functional activity Standing balance-Leahy Scale: Poor Standing balance comment: Reliant on BUE support                             Pertinent Vitals/Pain Pain Assessment: No/denies pain    Home Living Family/patient expects to be discharged to:: Private residence Living Arrangements: Children Available Help at Discharge: Family Type of Home: House Home Access: Stairs to enter   [employer] of Steps: has chair lift Home Layout: One level Home Equipment: Environmental consultant - 4 wheels;Bedside commode;Shower seat;Wheelchair - manual      Prior Function Level of Independence: Needs assistance   Gait / Transfers Assistance Needed: Reports using a walker for ambulation, however, reports sometimes uses WC for mobility.   ADL's / Homemaking Assistance Needed: Reports daughter occasionally assists with ADL tasks. Otherwise is independent.         Hand Dominance        Extremity/Trunk Assessment   Upper Extremity Assessment Upper Extremity Assessment: Defer to OT evaluation    Lower Extremity Assessment Lower Extremity Assessment: Generalized weakness    Cervical / Trunk Assessment Cervical / Trunk Assessment: Normal  Communication   Communication: No difficulties  Cognition Arousal/Alertness: Awake/alert Behavior During Therapy: WFL for tasks assessed/performed Overall Cognitive Status: Within Functional Limits for tasks assessed  General Comments General comments (skin integrity, edema, etc.): Pt concerned that she is still bleeding. Reports she does not want to go home too early. Educated about using WC if pt feeling weaker or more unsteady than normal.     Exercises     Assessment/Plan    PT Assessment Patient needs continued PT services  PT Problem List Decreased strength;Decreased balance       PT Treatment Interventions Gait  training;DME instruction;Functional mobility training;Therapeutic activities;Therapeutic exercise;Balance training;Patient/family education    PT Goals (Current goals can be found in the Care Plan section)  Acute Rehab PT Goals Patient Stated Goal: to stop bleeding  PT Goal Formulation: With patient Time For Goal Achievement: 07/22/19 Potential to Achieve Goals: Good    Frequency Min 3X/week   Barriers to discharge        Co-evaluation               AM-PAC PT "6 Clicks" Mobility  Outcome Measure Help needed turning from your back to your side while in a flat bed without using bedrails?: A Little Help needed moving from lying on your back to sitting on the side of a flat bed without using bedrails?: A Little Help needed moving to and from a bed to a chair (including a wheelchair)?: A Little Help needed standing up from a chair using your arms (e.g., wheelchair or bedside chair)?: A Little Help needed to walk in hospital room?: A Little Help needed climbing 3-5 steps with a railing? : A Lot 6 Click Score: 17    End of Session Equipment Utilized During Treatment: Gait belt Activity Tolerance: Patient tolerated treatment well Patient left: in chair;with call bell/phone within reach Nurse Communication: Mobility status PT Visit Diagnosis: Unsteadiness on feet (R26.81);Muscle weakness (generalized) (M62.81)    Time: [id number] PT Time Calculation (min) (ACUTE ONLY): 16 min   Charges:   PT Evaluation $PT Eval Moderate Complexity: 1 Mod          Reuel Derby, PT, DPT  Acute Rehabilitation Services  Pager: [phone] Office: [phone]   Rudean Hitt 07/08/2019, 1:54 PM

## 2019-07-09 DIAGNOSIS — E119 Type 2 diabetes mellitus without complications: Secondary | ICD-10-CM

## 2019-07-09 DIAGNOSIS — I5032 Chronic diastolic (congestive) heart failure: Secondary | ICD-10-CM

## 2019-07-09 LAB — BASIC METABOLIC PANEL
Anion gap: 11 (ref 5–15)
BUN: 62 mg/dL — ABNORMAL HIGH (ref 8–23)
CO2: 23 mmol/L (ref 22–32)
Calcium: 8.8 mg/dL — ABNORMAL LOW (ref 8.9–10.3)
Chloride: 108 mmol/L (ref 98–111)
Creatinine, Ser: 4.1 mg/dL — ABNORMAL HIGH (ref 0.44–1.00)
GFR calc Af Amer: 10 mL/min — ABNORMAL LOW (ref >60)
GFR calc non Af Amer: 9 mL/min — ABNORMAL LOW (ref >60)
Glucose, Bld: 80 mg/dL (ref 70–99)
Potassium: 4.5 mmol/L (ref 3.5–5.1)
Sodium: 142 mmol/L (ref 135–145)

## 2019-07-09 LAB — CBC
HCT: 25.5 % — ABNORMAL LOW (ref 36.0–46.0)
Hemoglobin: 8 g/dL — ABNORMAL LOW (ref 12.0–15.0)
MCH: 29.9 pg (ref 26.0–34.0)
MCHC: 31.4 g/dL (ref 30.0–36.0)
MCV: 95.1 fL (ref 80.0–100.0)
Platelets: 198 10*3/uL (ref 150–400)
RBC: 2.68 MIL/uL — ABNORMAL LOW (ref 3.87–5.11)
RDW: 16.7 % — ABNORMAL HIGH (ref 11.5–15.5)
WBC: 8.4 10*3/uL (ref 4.0–10.5)
nRBC: 0 % (ref 0.0–0.2)

## 2019-07-09 NOTE — Progress Notes (Signed)
PROGRESS NOTE    [name]  [id number] DOB: March 14, 1924 DOA: 07/06/2019  PCP: Alycia Rossetti, MD    Brief Narrative:  Patient is a 86 year old female with a medical history consisting of diastolic heart failure, type 2 diabetes with neuropathy, GERD, glaucoma, history of CVA, hypertensive chronic kidney disease stage IV who presented on 07/06/2019 for blood in her urine and fatigue.  She underwent a renal biopsy 3 days prior to admission due to worsening renal function.  The patient received 1 unit of packed red blood cells in the emergency department on 2/10.  She was admitted for symptomatic anemia and gross hematuria. Hemoglobin has remained stable and is currently 8.0 daily.  Assessment & Plan:   Principal Problem:   Hematuria Active Problems:   Chronic diastolic CHF (congestive heart failure) (HCC)   Essential hypertension   Hyperlipidemia   Diabetes mellitus, type II (HCC)   Peripheral neuropathy   CKD (chronic kidney disease) stage 4, GFR 15-29 ml/min (HCC)   GERD (gastroesophageal reflux disease)   Symptomatic anemia  Clinical problems list 1.  Hematuria/anemia due to acute GU loss 2.  Essential hypertension 3.  Diabetes mellitus type 2 4.  Chronic kidney disease stage IV 5.  Chronic heart failure with preserved ejection fraction 6.  History of CVA 7.  Hyperlipidemia  1.  Hematuria/anemia due to acute GU loss CT abdomen and pelvis showed moderate sized perinephric hematoma around the lower pole of the left kidney with mild left hydroureteral nephrosis and without obstructing calculus.  Large amount of blood clot is noted in the lumen of the urinary bladder.  Patient is status post transfusion of 1 unit of packed red blood cell hemoglobin has remained stable at 8.0. Urine sample was sent for culture and pending preliminary report. We will plan to discharge home if hemoglobin remains stable urine culture is negative.  2.  Essential hypertension.  Currently  normotensive Continue with hydralazine 10 mg twice daily  3.  Diabetes mellitus type 2 Continue with sliding scale insulin Fingersticks before meals and at bedtime Hypoglycemic protocol  4.  Chronic kidney disease stage IV (eGFR 15-29) admit stable at baseline. Continue to monitor renal function avoid nephrotoxic agents Patient will need outpatient follow-up with nephrology for discussion on initiating hemodialysis  5.  Chronic heart failure with preserved ejection fraction.  Last echocardiogram on 06/11/2017 showed EF of 65 to 70% with normal left ventricular systolic function.  Patient appears hemodynamically stable Continue with Lasix twice daily Judicious use of IV fluid  6.  History of CVA. Continue with aspirin and statin  7.  Hyperlipidemia Continue with pravastatin   DVT prophylaxis:  Code Status: Full code  Family Communication: None at bedside Disposition Plan: Patient is from home lives with daughter.  She will be discharged home. Barrier to discharge include a urine culture that was sent in today.  Pending preliminary report.    Consultants:   Interventional radiology  Procedures:  Antimicrobials: None    Subjective: Patient was seen and examined at bedside.  Sitting out in chair and not in acute distress.  She is alert and oriented with mild cognitive impairment.  Patient has urine culture sent in today and awaiting preliminary report. Hemoglobin has remained stable at 8.0 today.  We will continue to monitor CBC  Objective: Vitals:   07/08/19 2125 07/08/19 2341 07/09/19 0510 07/09/19 1043  BP: 137/67 (!) 123/59 121/61 133/74  Pulse:  74 75   Resp:  18 18  Temp:  98.5 F (36.9 C) 99.1 F (37.3 C)   TempSrc:  Oral Oral   SpO2:  94% 95%   Weight:   81.7 kg   Height:        Intake/Output Summary (Last 24 hours) at 07/09/2019 1605 Last data filed at 07/08/2019 2346 Gross per 24 hour  Intake --  Output 450 ml  Net -450 ml   Filed Weights    07/07/19 1840 07/08/19 0454 07/09/19 0510  Weight: 82.5 kg 82.1 kg 81.7 kg    Examination:  General exam: Appears calm and comfortable not in acute distress Respiratory system: Clear to auscultation. Respiratory effort normal. Cardiovascular system: S1 & S2 heard, RRR. No JVD, murmurs, rubs, gallops or clicks.  1+ bilateral pitting pedal edema  Gastrointestinal system: Abdomen is nondistended, soft and nontender. No organomegaly or masses felt. Normal bowel sounds heard. Central nervous system: Alert and oriented. No focal neurological deficits. Extremities: 4/5 muscle strength in bilateral lower extremity and 5/5 in bilateral upper extremities.  . Skin: No rashes, lesions or ulcers Psychiatry: Judgement and insight appear normal. Mood & affect appropriate.     Data Reviewed: I have personally reviewed following labs and imaging studies  CBC: Recent Labs  Lab 07/07/19 0136 07/07/19 0820 07/08/19 0533 07/08/19 1222 07/09/19 0603  WBC 16.4* 14.9* 10.1 11.2* 8.4  HGB 7.4* 7.1* 7.8* 8.8* 8.0*  HCT 23.4* 22.3* 24.3* 27.9* 25.5*  MCV 94.0 94.1 93.8 95.2 95.1  PLT 183 210 174 207 803   Basic Metabolic Panel: Recent Labs  Lab 07/06/19 1304 07/07/19 0136 07/07/19 0820 07/08/19 0533 07/09/19 0603  NA 143 142 142 142 142  K 4.4 4.4 4.4 4.7 4.5  CL 107 109 108 107 108  CO2 '22 24 24 22 23  '$ GLUCOSE 157* 128* 99 80 80  BUN 58* 58* 59* 61* 62*  CREATININE 4.82* 4.76* 4.64* 4.27* 4.10*  CALCIUM 9.3 8.6* 8.8* 8.3* 8.8*   GFR: Estimated Creatinine Clearance: 8.7 mL/min (A) (by C-G formula based on SCr of 4.1 mg/dL (H)). Liver Function Tests: Recent Labs  Lab 07/07/19 0136  AST 12*  ALT 9  ALKPHOS 105  BILITOT 0.8  PROT 5.5*  ALBUMIN 2.8*   No results for input(s): LIPASE, AMYLASE in the last 168 hours. No results for input(s): AMMONIA in the last 168 hours. Coagulation Profile: Recent Labs  Lab 07/04/19 0630  INR 1.1   Cardiac Enzymes: No results for input(s):  CKTOTAL, CKMB, CKMBINDEX, TROPONINI in the last 168 hours. BNP (last 3 results) No results for input(s): PROBNP in the last 8760 hours. HbA1C: No results for input(s): HGBA1C in the last 72 hours. CBG: Recent Labs  Lab 07/04/19 0631  GLUCAP 125*   Lipid Profile: No results for input(s): CHOL, HDL, LDLCALC, TRIG, CHOLHDL, LDLDIRECT in the last 72 hours. Thyroid Function Tests: Recent Labs    07/06/19 1746  TSH 0.912   Anemia Panel: No results for input(s): VITAMINB12, FOLATE, FERRITIN, TIBC, IRON, RETICCTPCT in the last 72 hours. Sepsis Labs: No results for input(s): PROCALCITON, LATICACIDVEN in the last 168 hours.  Recent Results (from the past 240 hour(s))  Respiratory Panel by RT PCR (Flu A&B, Covid) - Nasopharyngeal Swab     Status: None   Collection Time: 07/06/19  1:36 PM   Specimen: Nasopharyngeal Swab  Result Value Ref Range Status   SARS Coronavirus 2 by RT PCR NEGATIVE NEGATIVE Final    Comment: (NOTE) SARS-CoV-2 target nucleic acids are NOT DETECTED. The SARS-CoV-2  RNA is generally detectable in upper respiratoy specimens during the acute phase of infection. The lowest concentration of SARS-CoV-2 viral copies this assay can detect is 131 copies/mL. A negative result does not preclude SARS-Cov-2 infection and should not be used as the sole basis for treatment or other patient management decisions. A negative result may occur with  improper specimen collection/handling, submission of specimen other than nasopharyngeal swab, presence of viral mutation(s) within the areas targeted by this assay, and inadequate number of viral copies (<131 copies/mL). A negative result must be combined with clinical observations, patient history, and epidemiological information. The expected result is Negative. Fact Sheet for Patients:  PinkCheek.be Fact Sheet for Healthcare Providers:  GravelBags.it This test is not yet ap  proved or cleared by the [place] FDA and  has been authorized for detection and/or diagnosis of SARS-CoV-2 by FDA under an Emergency Use Authorization (EUA). This EUA will remain  in effect (meaning this test can be used) for the duration of the COVID-19 declaration under Section 564(b)(1) of the Act, 21 U.S.C. section 360bbb-3(b)(1), unless the authorization is terminated or revoked sooner.    Influenza A by PCR NEGATIVE NEGATIVE Final   Influenza B by PCR NEGATIVE NEGATIVE Final    Comment: (NOTE) The Xpert Xpress SARS-CoV-2/FLU/RSV assay is intended as an aid in  the diagnosis of influenza from Nasopharyngeal swab specimens and  should not be used as a sole basis for treatment. Nasal washings and  aspirates are unacceptable for Xpert Xpress SARS-CoV-2/FLU/RSV  testing. Fact Sheet for Patients: PinkCheek.be Fact Sheet for Healthcare Providers: GravelBags.it This test is not yet approved or cleared by the [place] FDA and  has been authorized for detection and/or diagnosis of SARS-CoV-2 by  FDA under an Emergency Use Authorization (EUA). This EUA will remain  in effect (meaning this test can be used) for the duration of the  Covid-19 declaration under Section 564(b)(1) of the Act, 21  U.S.C. section 360bbb-3(b)(1), unless the authorization is  terminated or revoked. Performed at [facility], [place] [address], [place], [place] [address]   Culture, blood (routine x 2)     Status: None (Preliminary result)   Collection Time: 07/06/19  5:46 PM   Specimen: BLOOD LEFT WRIST  Result Value Ref Range Status   Specimen Description BLOOD LEFT WRIST  Final   Special Requests   Final    BOTTLES DRAWN AEROBIC AND ANAEROBIC Blood Culture results may not be optimal due to an inadequate volume of blood received in culture bottles   Culture   Final    NO GROWTH 3 DAYS Performed at [facility], [place] [address]  [place], [place] [address]    Report Status PENDING  Incomplete  Culture, blood (routine x 2)     Status: None (Preliminary result)   Collection Time: 07/06/19  5:54 PM   Specimen: BLOOD LEFT HAND  Result Value Ref Range Status   Specimen Description BLOOD LEFT HAND  Final   Special Requests   Final    BOTTLES DRAWN AEROBIC AND ANAEROBIC Blood Culture results may not be optimal due to an inadequate volume of blood received in culture bottles   Culture   Final    NO GROWTH 3 DAYS Performed at [facility], [place] [address], [place], [place] [address]    Report Status PENDING  Incomplete         Radiology Studies: No results found.      Scheduled Meds: .  Chlorhexidine Gluconate Cloth  6 each Topical Daily  . dorzolamide-timolol  1 drop Right Eye BID  . hydrALAZINE  10 mg Oral BID  . pantoprazole  40 mg Oral Daily  . pravastatin  20 mg Oral QPM  . QUEtiapine  25 mg Oral QHS  . sodium chloride flush  3 mL Intravenous Once  . timolol  1 drop Left Eye Daily   Continuous Infusions:   LOS: 2 days    Time spent: 35 minutes    Elie Confer, MD [facility] Pager [phone]   If 7PM-7AM, please contact night-coverage [email or web address] Password [facility] 07/09/2019, 4:05 PM

## 2019-07-10 LAB — URINE CULTURE

## 2019-07-10 LAB — CBC
HCT: 26.8 % — ABNORMAL LOW (ref 36.0–46.0)
Hemoglobin: 8.4 g/dL — ABNORMAL LOW (ref 12.0–15.0)
MCH: 29.5 pg (ref 26.0–34.0)
MCHC: 31.3 g/dL (ref 30.0–36.0)
MCV: 94 fL (ref 80.0–100.0)
Platelets: 223 10*3/uL (ref 150–400)
RBC: 2.85 MIL/uL — ABNORMAL LOW (ref 3.87–5.11)
RDW: 16 % — ABNORMAL HIGH (ref 11.5–15.5)
WBC: 8.6 10*3/uL (ref 4.0–10.5)
nRBC: 0 % (ref 0.0–0.2)

## 2019-07-10 LAB — COMPREHENSIVE METABOLIC PANEL
ALT: 8 U/L (ref 0–44)
AST: 12 U/L — ABNORMAL LOW (ref 15–41)
Albumin: 2.6 g/dL — ABNORMAL LOW (ref 3.5–5.0)
Alkaline Phosphatase: 91 U/L (ref 38–126)
Anion gap: 10 (ref 5–15)
BUN: 58 mg/dL — ABNORMAL HIGH (ref 8–23)
CO2: 22 mmol/L (ref 22–32)
Calcium: 8.7 mg/dL — ABNORMAL LOW (ref 8.9–10.3)
Chloride: 108 mmol/L (ref 98–111)
Creatinine, Ser: 3.57 mg/dL — ABNORMAL HIGH (ref 0.44–1.00)
GFR calc Af Amer: 12 mL/min — ABNORMAL LOW (ref >60)
GFR calc non Af Amer: 10 mL/min — ABNORMAL LOW (ref >60)
Glucose, Bld: 109 mg/dL — ABNORMAL HIGH (ref 70–99)
Potassium: 4.6 mmol/L (ref 3.5–5.1)
Sodium: 140 mmol/L (ref 135–145)
Total Bilirubin: 0.7 mg/dL (ref 0.3–1.2)
Total Protein: 5.7 g/dL — ABNORMAL LOW (ref 6.5–8.1)

## 2019-07-10 LAB — MAGNESIUM: Magnesium: 2 mg/dL (ref 1.7–2.4)

## 2019-07-10 LAB — PHOSPHORUS: Phosphorus: 5 mg/dL — ABNORMAL HIGH (ref 2.5–4.6)

## 2019-07-10 NOTE — Progress Notes (Signed)
PROGRESS NOTE    GRATIA DISLA  HQP:[id number] DOB: 07-02-1923 DOA: 07/06/2019  PCP: Alycia Rossetti, MD    Brief Narrative:  Patient is a 86 year old female with a medical history consisting of diastolic heart failure, type 2 diabetes with neuropathy, GERD, glaucoma, history of CVA, hypertensive chronic kidney disease stage IV who presented on 07/06/2019 for blood in her urine and fatigue.  She underwent a renal biopsy 3 days prior to admission due to worsening renal function.  The patient received 1 unit of packed red blood cells in the emergency department on 2/10.  She was admitted for symptomatic anemia and gross hematuria. Hemoglobin has remained stable and is currently 8.4  today.  Assessment & Plan:   Principal Problem:   Hematuria Active Problems:   Chronic diastolic CHF (congestive heart failure) (HCC)   Essential hypertension   Hyperlipidemia   Diabetes mellitus, type II (HCC)   Peripheral neuropathy   CKD (chronic kidney disease) stage 4, GFR 15-29 ml/min (HCC)   GERD (gastroesophageal reflux disease)   Symptomatic anemia  Clinical problems list 1.  Hematuria/anemia due to acute GU loss 2.  Essential hypertension 3.  Diabetes mellitus type 2 4.  Chronic kidney disease stage IV 5.  Chronic heart failure with preserved ejection fraction 6.  History of CVA 7.  Hyperlipidemia  1.  Hematuria/anemia due to acute GU loss CT abdomen and pelvis showed moderate sized perinephric hematoma around the lower pole of the left kidney with mild left hydroureteral nephrosis and without obstructing calculus.  Large amount of blood clot is noted in the lumen of the urinary bladder.  Patient is status post transfusion of 1 unit of packed red blood cell hemoglobin has remained stable at 8.0. Urine sample was sent for culture and pending preliminary report. Urine culture showed multiple species with No identifiable pathogen. Plan is  to discharge patient home tomorrow  2.  Essential  hypertension.  Currently normotensive Continue with hydralazine 10 mg twice daily  3.  Diabetes mellitus type 2 Continue with sliding scale insulin Fingersticks before meals and at bedtime Hypoglycemic protocol  4.  Chronic kidney disease stage IV (eGFR 15-29) admit stable at baseline. Continue to monitor renal function avoid nephrotoxic agents Patient will need outpatient follow-up with nephrology for discussion on initiating hemodialysis  5.  Chronic heart failure with preserved ejection fraction.  Last echocardiogram on 06/11/2017 showed EF of 65 to 70% with normal left ventricular systolic function.  Patient appears hemodynamically stable Continue with Lasix twice daily Judicious use of IV fluid  6.  History of CVA. Continue with aspirin and statin  7.  Hyperlipidemia Continue with pravastatin   DVT prophylaxis:  Code Status: Full code  Family Communication: None at bedside Disposition Plan: Patient is from home lives with daughter.  She will be discharged home. No barrier to discharge patient home tomorrow 07/11/2019   Consultants:   Interventional radiology  Procedures:  Antimicrobials: None    Subjective: Patient was seen and examined at bedside.  She is sitting up in bed and reading her Bible.  Not in acute distress.  She is alert and oriented with some memory lapses.  Very pleasant.  No episode of hematuria documented.  Hemoglobin has been stable and noted at 8.4 today.  Objective: Vitals:   07/10/19 0003 07/10/19 0637 07/10/19 0914 07/10/19 1157  BP: 127/60 (!) 146/68 133/69 (!) 128/58  Pulse: 70 69  70  Resp: '18 17  18  '$ Temp: 98 F (36.7 C)  98 F (36.7 C)  98 F (36.7 C)  TempSrc: Oral Oral  Oral  SpO2: 94% 94%  98%  Weight:  85.2 kg    Height:        Intake/Output Summary (Last 24 hours) at 07/10/2019 1303 Last data filed at 07/10/2019 0641 Gross per 24 hour  Intake 260 ml  Output 950 ml  Net -690 ml   Filed Weights   07/08/19 0454 07/09/19  0510 07/10/19 0637  Weight: 82.1 kg 81.7 kg 85.2 kg    Examination:  General exam: Appears calm and comfortable not in acute distress Respiratory system: Clear to auscultation. Respiratory effort normal. Cardiovascular system: S1 & S2 heard, RRR. No JVD, murmurs, rubs, gallops or clicks.  1+ bilateral pitting pedal edema  Gastrointestinal system: Abdomen is nondistended, soft and nontender. No organomegaly or masses felt. Normal bowel sounds heard. Central nervous system: Alert and oriented. No focal neurological deficits. Extremities: 4/5 muscle strength in bilateral lower extremity and 5/5 in bilateral upper extremities.  . Skin: No rashes, lesions or ulcers Psychiatry: Judgement and insight appear normal. Mood & affect appropriate.     Data Reviewed: I have personally reviewed following labs and imaging studies  CBC: Recent Labs  Lab 07/07/19 0820 07/08/19 0533 07/08/19 1222 07/09/19 0603 07/10/19 0231  WBC 14.9* 10.1 11.2* 8.4 8.6  HGB 7.1* 7.8* 8.8* 8.0* 8.4*  HCT 22.3* 24.3* 27.9* 25.5* 26.8*  MCV 94.1 93.8 95.2 95.1 94.0  PLT 210 174 207 198 756   Basic Metabolic Panel: Recent Labs  Lab 07/07/19 0136 07/07/19 0820 07/08/19 0533 07/09/19 0603 07/10/19 0231  NA 142 142 142 142 140  K 4.4 4.4 4.7 4.5 4.6  CL 109 108 107 108 108  CO2 '24 24 22 23 22  '$ GLUCOSE 128* 99 80 80 109*  BUN 58* 59* 61* 62* 58*  CREATININE 4.76* 4.64* 4.27* 4.10* 3.57*  CALCIUM 8.6* 8.8* 8.3* 8.8* 8.7*  MG  --   --   --   --  2.0  PHOS  --   --   --   --  5.0*   GFR: Estimated Creatinine Clearance: 10.2 mL/min (A) (by C-G formula based on SCr of 3.57 mg/dL (H)). Liver Function Tests: Recent Labs  Lab 07/07/19 0136 07/10/19 0231  AST 12* 12*  ALT 9 8  ALKPHOS 105 91  BILITOT 0.8 0.7  PROT 5.5* 5.7*  ALBUMIN 2.8* 2.6*   No results for input(s): LIPASE, AMYLASE in the last 168 hours. No results for input(s): AMMONIA in the last 168 hours. Coagulation Profile: Recent Labs    Lab 07/04/19 0630  INR 1.1   Cardiac Enzymes: No results for input(s): CKTOTAL, CKMB, CKMBINDEX, TROPONINI in the last 168 hours. BNP (last 3 results) No results for input(s): PROBNP in the last 8760 hours. HbA1C: No results for input(s): HGBA1C in the last 72 hours. CBG: Recent Labs  Lab 07/04/19 0631  GLUCAP 125*   Lipid Profile: No results for input(s): CHOL, HDL, LDLCALC, TRIG, CHOLHDL, LDLDIRECT in the last 72 hours. Thyroid Function Tests: No results for input(s): TSH, T4TOTAL, FREET4, T3FREE, THYROIDAB in the last 72 hours. Anemia Panel: No results for input(s): VITAMINB12, FOLATE, FERRITIN, TIBC, IRON, RETICCTPCT in the last 72 hours. Sepsis Labs: No results for input(s): PROCALCITON, LATICACIDVEN in the last 168 hours.  Recent Results (from the past 240 hour(s))  Respiratory Panel by RT PCR (Flu A&B, Covid) - Nasopharyngeal Swab     Status: None   Collection Time:  07/06/19  1:36 PM   Specimen: Nasopharyngeal Swab  Result Value Ref Range Status   SARS Coronavirus 2 by RT PCR NEGATIVE NEGATIVE Final    Comment: (NOTE) SARS-CoV-2 target nucleic acids are NOT DETECTED. The SARS-CoV-2 RNA is generally detectable in upper respiratoy specimens during the acute phase of infection. The lowest concentration of SARS-CoV-2 viral copies this assay can detect is 131 copies/mL. A negative result does not preclude SARS-Cov-2 infection and should not be used as the sole basis for treatment or other patient management decisions. A negative result may occur with  improper specimen collection/handling, submission of specimen other than nasopharyngeal swab, presence of viral mutation(s) within the areas targeted by this assay, and inadequate number of viral copies (<131 copies/mL). A negative result must be combined with clinical observations, patient history, and epidemiological information. The expected result is Negative. Fact Sheet for Patients:   PinkCheek.be Fact Sheet for Healthcare Providers:  GravelBags.it This test is not yet ap proved or cleared by the [place] FDA and  has been authorized for detection and/or diagnosis of SARS-CoV-2 by FDA under an Emergency Use Authorization (EUA). This EUA will remain  in effect (meaning this test can be used) for the duration of the COVID-19 declaration under Section 564(b)(1) of the Act, 21 U.S.C. section 360bbb-3(b)(1), unless the authorization is terminated or revoked sooner.    Influenza A by PCR NEGATIVE NEGATIVE Final   Influenza B by PCR NEGATIVE NEGATIVE Final    Comment: (NOTE) The Xpert Xpress SARS-CoV-2/FLU/RSV assay is intended as an aid in  the diagnosis of influenza from Nasopharyngeal swab specimens and  should not be used as a sole basis for treatment. Nasal washings and  aspirates are unacceptable for Xpert Xpress SARS-CoV-2/FLU/RSV  testing. Fact Sheet for Patients: PinkCheek.be Fact Sheet for Healthcare Providers: GravelBags.it This test is not yet approved or cleared by the [place] FDA and  has been authorized for detection and/or diagnosis of SARS-CoV-2 by  FDA under an Emergency Use Authorization (EUA). This EUA will remain  in effect (meaning this test can be used) for the duration of the  Covid-19 declaration under Section 564(b)(1) of the Act, 21  U.S.C. section 360bbb-3(b)(1), unless the authorization is  terminated or revoked. Performed at [facility], [place] [address], [place], [place] [address]   Culture, blood (routine x 2)     Status: None (Preliminary result)   Collection Time: 07/06/19  5:46 PM   Specimen: BLOOD LEFT WRIST  Result Value Ref Range Status   Specimen Description BLOOD LEFT WRIST  Final   Special Requests   Final    BOTTLES DRAWN AEROBIC AND ANAEROBIC Blood Culture results may not be optimal due to an  inadequate volume of blood received in culture bottles   Culture   Final    NO GROWTH 3 DAYS Performed at [facility], [place] [address] [place],  [address]    Report Status PENDING  Incomplete  Culture, blood (routine x 2)     Status: None (Preliminary result)   Collection Time: 07/06/19  5:54 PM   Specimen: BLOOD LEFT HAND  Result Value Ref Range Status   Specimen Description BLOOD LEFT HAND  Final   Special Requests   Final    BOTTLES DRAWN AEROBIC AND ANAEROBIC Blood Culture results may not be optimal due to an inadequate volume of blood received in culture bottles   Culture   Final    NO GROWTH 3 DAYS Performed  at [facility], [place] [address], [place], [place] [address]    Report Status PENDING  Incomplete  Urine culture     Status: Abnormal   Collection Time: 07/09/19  7:50 AM   Specimen: Urine, Random  Result Value Ref Range Status   Specimen Description URINE, RANDOM  Final   Special Requests   Final    NONE Performed at [facility], [place] [address], [place], [place] [address]    Culture MULTIPLE SPECIES PRESENT, SUGGEST RECOLLECTION (A)  Final   Report Status 07/10/2019 FINAL  Final         Radiology Studies: No results found.      Scheduled Meds: . Chlorhexidine Gluconate Cloth  6 each Topical Daily  . dorzolamide-timolol  1 drop Right Eye BID  . hydrALAZINE  10 mg Oral BID  . pantoprazole  40 mg Oral Daily  . pravastatin  20 mg Oral QPM  . QUEtiapine  25 mg Oral QHS  . sodium chloride flush  3 mL Intravenous Once  . timolol  1 drop Left Eye Daily   Continuous Infusions:   LOS: 3 days    Time spent: 35 minutes    Elie Confer, MD [facility] Pager [phone]   If 7PM-7AM, please contact night-coverage [email or web address] Password [facility] 07/10/2019, 1:03 PM

## 2019-07-11 ENCOUNTER — Encounter (HOSPITAL_COMMUNITY): Payer: Self-pay | Admitting: Internal Medicine

## 2019-07-11 ENCOUNTER — Telehealth: Payer: Self-pay | Admitting: Family Medicine

## 2019-07-11 DIAGNOSIS — I10 Essential (primary) hypertension: Secondary | ICD-10-CM

## 2019-07-11 LAB — COMPREHENSIVE METABOLIC PANEL WITH GFR
ALT: 9 U/L (ref 0–44)
AST: 11 U/L — ABNORMAL LOW (ref 15–41)
Albumin: 2.4 g/dL — ABNORMAL LOW (ref 3.5–5.0)
Alkaline Phosphatase: 86 U/L (ref 38–126)
Anion gap: 12 (ref 5–15)
BUN: 56 mg/dL — ABNORMAL HIGH (ref 8–23)
CO2: 24 mmol/L (ref 22–32)
Calcium: 8.7 mg/dL — ABNORMAL LOW (ref 8.9–10.3)
Chloride: 106 mmol/L (ref 98–111)
Creatinine, Ser: 3.62 mg/dL — ABNORMAL HIGH (ref 0.44–1.00)
GFR calc Af Amer: 12 mL/min — ABNORMAL LOW
GFR calc non Af Amer: 10 mL/min — ABNORMAL LOW
Glucose, Bld: 90 mg/dL (ref 70–99)
Potassium: 4.6 mmol/L (ref 3.5–5.1)
Sodium: 142 mmol/L (ref 135–145)
Total Bilirubin: 0.7 mg/dL (ref 0.3–1.2)
Total Protein: 5.2 g/dL — ABNORMAL LOW (ref 6.5–8.1)

## 2019-07-11 LAB — CBC
HCT: 25.5 % — ABNORMAL LOW (ref 36.0–46.0)
Hemoglobin: 8.2 g/dL — ABNORMAL LOW (ref 12.0–15.0)
MCH: 30.4 pg (ref 26.0–34.0)
MCHC: 32.2 g/dL (ref 30.0–36.0)
MCV: 94.4 fL (ref 80.0–100.0)
Platelets: 211 K/uL (ref 150–400)
RBC: 2.7 MIL/uL — ABNORMAL LOW (ref 3.87–5.11)
RDW: 15.8 % — ABNORMAL HIGH (ref 11.5–15.5)
WBC: 7.4 K/uL (ref 4.0–10.5)
nRBC: 0 % (ref 0.0–0.2)

## 2019-07-11 LAB — PHOSPHORUS: Phosphorus: 4.4 mg/dL (ref 2.5–4.6)

## 2019-07-11 LAB — CULTURE, BLOOD (ROUTINE X 2)
Culture: NO GROWTH
Culture: NO GROWTH

## 2019-07-11 LAB — MAGNESIUM: Magnesium: 1.9 mg/dL (ref 1.7–2.4)

## 2019-07-11 NOTE — Progress Notes (Addendum)
Physical Therapy Treatment Patient Details Name: [name] MRN: [id number] DOB: 07-Feb-1924 Today's Date: 07/11/2019    History of Present Illness Pt is a 86 y/o female admitted secondary to shortness of breath and weakness. Found to have hematuria following renal biopsy. Imaging showed L kidney perinephric hematoma. PMH includes dCHF, HTN, glaucoma, CKD, and gout.     PT Comments    Pt progressing well with mobility. She was able to demonstrate increased independence with functional mobility tasks, however continues to fatigue quickly once OOB. After ADL task at the sink with OT, pt requiring seated rest break, and hallway ambulation was deferred. She was able to tolerate therapeutic exercise in the chair and overall demonstrated a good rehab effort. Daughter present and reports they have a wheelchair she can use at home if needbe for longer distance mobility. Will continue to follow and progress as able per POC.    Follow Up Recommendations  Home health PT;Supervision for mobility/OOB     Equipment Recommendations  None recommended by PT    Recommendations for Other Services       Precautions / Restrictions Precautions Precautions: Fall Restrictions Weight Bearing Restrictions: No    Mobility  Bed Mobility Overal bed mobility: Needs Assistance Bed Mobility: Supine to Sit     Supine to sit: Supervision     General bed mobility comments: No assist and no use of rails. HOB mildly elevated.   Transfers Overall transfer level: Needs assistance Equipment used: Rolling walker (2 wheeled) Transfers: Sit to/from Stand Sit to Stand: Min guard Stand pivot transfers: Min guard       General transfer comment: Min gaurd for safety, kyphotic posture when ambulating, limited due to fatigue  Ambulation/Gait Ambulation/Gait assistance: Min guard Gait Distance (Feet): 25 Feet Assistive device: Rolling walker (2 wheeled) Gait Pattern/deviations: Decreased stride  length;Shuffle;Trunk flexed Gait velocity: Decreased Gait velocity interpretation: <1.31 ft/sec, indicative of household ambulator General Gait Details: Trunk very flexed throughout. No overt LOB noted however pt very reliant on RW   Stairs             Wheelchair Mobility    Modified Rankin (Stroke Patients Only)       Balance Overall balance assessment: Needs assistance Sitting-balance support: No upper extremity supported;Feet supported Sitting balance-Leahy Scale: Fair     Standing balance support: Bilateral upper extremity supported;During functional activity Standing balance-Leahy Scale: Poor Standing balance comment: Reliant on BUE support                            Cognition Arousal/Alertness: Awake/alert Behavior During Therapy: WFL for tasks assessed/performed Overall Cognitive Status: Within Functional Limits for tasks assessed                                        Exercises General Exercises - Lower Extremity Ankle Circles/Pumps: 10 reps Quad Sets: 15 reps Short Arc Quad: 15 reps Long Arc Quad: 15 reps Hip ABduction/ADduction: 15 reps    General Comments        Pertinent Vitals/Pain Pain Assessment: No/denies pain    Home Living                      Prior Function            PT Goals (current goals can now be found in the care plan  section) Acute Rehab PT Goals Patient Stated Goal: Go home PT Goal Formulation: With patient Time For Goal Achievement: 07/22/19 Potential to Achieve Goals: Good Progress towards PT goals: Progressing toward goals    Frequency    Min 3X/week      PT Plan Current plan remains appropriate    Co-evaluation              AM-PAC PT "6 Clicks" Mobility   Outcome Measure  Help needed turning from your back to your side while in a flat bed without using bedrails?: None Help needed moving from lying on your back to sitting on the side of a flat bed without using  bedrails?: None Help needed moving to and from a bed to a chair (including a wheelchair)?: A Little Help needed standing up from a chair using your arms (e.g., wheelchair or bedside chair)?: A Little Help needed to walk in hospital room?: A Little Help needed climbing 3-5 steps with a railing? : A Little 6 Click Score: 20    End of Session Equipment Utilized During Treatment: Gait belt Activity Tolerance: Patient limited by fatigue Patient left: in chair;with call bell/phone within reach;with chair alarm set;with family/visitor present Nurse Communication: Mobility status PT Visit Diagnosis: Unsteadiness on feet (R26.81);Muscle weakness (generalized) (M62.81)     Time: 1001-1031 PT Time Calculation (min) (ACUTE ONLY): 30 min  Charges:  $Gait Training: 8-22 mins $Therapeutic Exercise: 8-22 mins                     Rolinda Roan, PT, DPT Acute Rehabilitation Services Pager: [phone] Office: [phone]    Thelma Comp 07/11/2019, 12:41 PM

## 2019-07-11 NOTE — Discharge Summary (Signed)
Physician Discharge Summary  RANEESHA ASHCROFT [id number] DOB: 1923/10/17 DOA: 07/06/2019  PCP: Alycia Rossetti, MD  Admit date: 07/06/2019 Discharge date: 07/11/2019  Admitted From: home Disposition:  home  Recommendations for Outpatient Follow-up:  1. Follow up with PCP in 1-2 weeks 2. Follow up with renal specialist 3. Please obtain BMP/CBC in one week 4. Please follow up on the following pending results:  Home Health:yes PT Equipment/Devices:rollator 4 wheeled walker with seat  Discharge Condition: stable CODE STATUS:full Diet recommendation: Heart Healthy  Brief/Interim Summary: Patient is a 86 year old female with a medical history consisting of diastolic heart failure, type 2 diabetes with neuropathy, GERD, glaucoma, history of CVA, hypertensive chronic kidney disease stage IV who presented on 07/06/2019 for blood in her urine and fatigue. She underwent a renal biopsy 3 days prior to admission due to worsening renal function. The patient received 1 unit of packed red blood cells in the emergency department on 2/10. She was admitted for symptomatic anemia and gross hematuria. Hemoglobin has remained stable and is currently 8.2 on day of discharge. Given fluids and had resolution of gross hematuria. No fevers or chills or pain throughout stay. Discharged home with home health PT.  Discharge Diagnoses:  Principal Problem:   Hematuria Active Problems:   Chronic diastolic CHF (congestive heart failure) (HCC)   Essential hypertension   Hyperlipidemia   Diabetes mellitus, type II (HCC)   Peripheral neuropathy   CKD (chronic kidney disease) stage 4, GFR 15-29 ml/min (HCC)   GERD (gastroesophageal reflux disease)   Symptomatic anemia  Discharge Instructions  Discharge Instructions    Call MD for:  severe uncontrolled pain   Complete by: As directed    Call MD for:  temperature >100.4   Complete by: As directed    Diet - low sodium heart healthy   Complete by: As  directed    Increase activity slowly   Complete by: As directed      Allergies as of 07/11/2019   No Known Allergies     Medication List    TAKE these medications   acetaminophen 500 MG tablet Commonly known as: TYLENOL Take 1,500 mg by mouth every 6 (six) hours as needed for mild pain.   allopurinol 300 MG tablet Commonly known as: ZYLOPRIM TAKE 1 TABLET BY MOUTH  DAILY WITH BREAKFAST What changed: when to take this   amLODipine 5 MG tablet Commonly known as: NORVASC TAKE 1 TABLET BY MOUTH  DAILY   diclofenac sodium 1 % Gel Commonly known as: VOLTAREN Apply 2 g topically 4 (four) times daily.   dorzolamide-timolol 22.3-6.8 MG/ML ophthalmic solution Commonly known as: COSOPT Place 1 drop into the right eye 2 (two) times daily.   furosemide 40 MG tablet Commonly known as: LASIX TAKE ONE-HALF TABLET BY  MOUTH DAILY   hydrALAZINE 10 MG tablet Commonly known as: APRESOLINE TAKE 1 TABLET BY MOUTH  TWICE DAILY   omeprazole 40 MG capsule Commonly known as: PRILOSEC TAKE 1 CAPSULE BY MOUTH  DAILY FOR REFLUX What changed: See the new instructions.   pravastatin 20 MG tablet Commonly known as: PRAVACHOL TAKE 1 TABLET BY MOUTH IN  THE EVENING What changed: when to take this   QUEtiapine 25 MG tablet Commonly known as: SEROQUEL Take 25 mg by mouth at bedtime.   timolol 0.5 % ophthalmic solution Commonly known as: BETIMOL Place 1 drop into the left eye daily.   Vitamin D (Ergocalciferol) 1.25 MG (50000 UNIT) Caps capsule Commonly known as: DRISDOL  Take 1 capsule by mouth once a week.            Durable Medical Equipment  (From admission, onward)         Start     Ordered   07/11/19 1048  For home use only DME 4 wheeled rolling walker with seat  Once    Question Answer Comment  Patient needs a walker to treat with the following condition Hematuria   Patient needs a walker to treat with the following condition Chronic diastolic (congestive) heart failure  ([name])      07/11/19 1048         Follow-up Information    [facility] Follow up.   Specialty: Home Health Services Contact information: [place] [address] [phone]        Alycia Rossetti, MD. Schedule an appointment as soon as possible for a visit in 2 week(s).   Specialty: Family Medicine Contact information: [place] [address] [phone]          No Known Allergies  Consultations:  none  Procedures/Studies: CT ABDOMEN PELVIS WO CONTRAST  Result Date: 07/06/2019 CLINICAL DATA:  Gross hematuria status post renal biopsy. EXAM: CT ABDOMEN AND PELVIS WITHOUT CONTRAST TECHNIQUE: Multidetector CT imaging of the abdomen and pelvis was performed following the standard protocol without IV contrast. COMPARISON:  May 12, 2019. FINDINGS: Lower chest: No acute abnormality. Hepatobiliary: No focal liver abnormality is seen. No gallstones, gallbladder wall thickening, or biliary dilatation. Pancreas: Unremarkable. No pancreatic ductal dilatation or surrounding inflammatory changes. Spleen: Normal in size without focal abnormality. Adrenals/Urinary Tract: Adrenal glands appear normal. Nonobstructive right nephrolithiasis is noted, including 16 x 14 mm staghorn type calculus in lower pole calyx. There is noted moderate size perinephric hematoma around the lower pole of left kidney. Mild left hydroureteronephrosis is noted without definite obstructing calculus. Large amount of blood clot is noted in the dependent portion of the urinary bladder. Stomach/Bowel: Stomach appears normal. There is no evidence of bowel obstruction or inflammation. Sigmoid diverticulosis is noted without inflammation. Vascular/Lymphatic: Aortic atherosclerosis. No enlarged abdominal or pelvic lymph nodes. Reproductive: Status post hysterectomy. No adnexal masses. Other: No abdominal wall hernia or abnormality. No abdominopelvic ascites.  Musculoskeletal: Multilevel degenerative disc disease is noted in the lower lumbar spine. No acute osseous abnormality is noted. IMPRESSION: Moderate sized perinephric hematoma is seen around the lower pole of left kidney, with mild left hydroureteronephrosis present without obstructing calculus. Large amount of blood clot is noted in the lumen of the urinary bladder. Nonobstructive right nephrolithiasis, including large staghorn type calculus in lower pole collecting system. Sigmoid diverticulosis without inflammation. Aortic Atherosclerosis (ICD10-I70.0). Electronically Signed   By: Marijo Conception M.D.   On: 07/06/2019 14:24   DG Chest 2 View  Result Date: 07/06/2019 CLINICAL DATA:  Shortness of breath, left flank pain following left renal biopsy EXAM: CHEST - 2 VIEW COMPARISON:  06/23/2018 FINDINGS: Cardiac shadow is enlarged but stable. Aortic calcifications are seen. Previously seen patchy infiltrates have nearly completely resolved. Bibasilar atelectatic changes are noted. No sizable effusion is seen. No bony abnormality is noted. IMPRESSION: Bibasilar atelectasis. Previously seen infiltrates have nearly completely resolved. Electronically Signed   By: Inez Catalina M.D.   On: 07/06/2019 12:27   CT Head Wo Contrast  Result Date: 06/28/2019 CLINICAL DATA:  Posterior headache, increasing confusion and hallucinations for 1 month. EXAM: CT HEAD WITHOUT CONTRAST TECHNIQUE: Contiguous axial images  were obtained from the base of the skull through the vertex without intravenous contrast. COMPARISON:  Brain MRI 02/05/1999. FINDINGS: Brain: No evidence of acute infarction, hemorrhage, hydrocephalus, extra-axial collection or mass lesion/mass effect. Vascular: No hyperdense vessel or unexpected calcification. Skull: No fracture or focal lesion. Sinuses/Orbits: Negative. Other: None. IMPRESSION: Negative head CT. Electronically Signed   By: Inge Rise M.D.   On: 06/28/2019 12:59   DG Chest Port 1  View  Result Date: 06/24/2019 CLINICAL DATA:  Chills. EXAM: PORTABLE CHEST 1 VIEW COMPARISON:  November 04, 2017 FINDINGS: Mild diffuse infiltrates are noted. There is no evidence of a pleural effusion or pneumothorax. The cardiac silhouette is mildly enlarged. There is marked severity calcification of the aortic arch. Degenerative changes are seen within the thoracic spine. IMPRESSION: 1. Mild diffuse bilateral infiltrates. Electronically Signed   By: Virgina Norfolk M.D.   On: 06/24/2019 15:48   US BIOPSY (KIDNEY)  Result Date: 07/04/2019 INDICATION: Worsening renal function. Please perform random renal biopsy for tissue diagnostic purposes. EXAM: ULTRASOUND GUIDED RENAL BIOPSY COMPARISON:  Noncontrast CT abdomen and pelvis-05/12/2019; renal ultrasound-05/03/2019 MEDICATIONS: None. ANESTHESIA/SEDATION: Fentanyl 25 mcg IV; Versed 0.5 mg IV Total Moderate Sedation time: 10 minutes; The patient was continuously monitored during the procedure by the interventional radiology nurse under my direct supervision. COMPLICATIONS: SIR Level A - No therapy, no consequence. Procedure complicated by development of a small left-sided perinephric hematoma as well as development of hematuria following the procedure. PROCEDURE: Informed written consent was obtained from the patient after a discussion of the risks, benefits and alternatives to treatment. The patient understands and consents the procedure. A timeout was performed prior to the initiation of the procedure. Ultrasound scanning was performed of the bilateral flanks. The inferior pole of the left kidney was selected for biopsy due to location and sonographic window. The procedure was planned. The operative site was prepped and draped in the usual sterile fashion. The overlying soft tissues were anesthetized with 1% lidocaine with epinephrine. A 17 gauge core needle biopsy device was advanced into the inferior cortex of the left kidney and 3 core biopsies were obtained  under direct ultrasound guidance. Images were saved for documentation purposes. The biopsy device was removed and hemostasis was obtained with manual compression. Post procedural scanning was negative for significant post procedural hemorrhage or additional complication. A dressing was placed. The patient tolerated the procedure well without immediate post procedural complication. IMPRESSION: Technically successful ultrasound guided left renal biopsy. Procedure complicated by development of a small perinephric hematoma as well as postprocedural hematuria. The patient remained hemodynamically stable with improvement, near resolution of postoperative hematuria following prolonged observation the short-stay unit prior to discharge. Electronically Signed   By: Sandi Mariscal M.D.   On: 07/04/2019 11:45    Subjective: Feeling well overall. Urine is now yellow, rare pink still noticed. Not passing any clots. Denies pain at site of biopsy. Denies fevers, chills. Denies chest pains or SOB.   Discharge Exam: Vitals:   07/11/19 0545 07/11/19 0937  BP: (!) 125/59 (!) 131/59  Pulse: 71 67  Resp: 16 18  Temp: 99.2 F (37.3 C) 98.8 F (37.1 C)  SpO2: 97% 97%   Vitals:   07/10/19 2351 07/11/19 0545 07/11/19 0600 07/11/19 0937  BP: (!) 159/77 (!) 125/59  (!) 131/59  Pulse: 71 71  67  Resp:  16  18  Temp: 98.6 F (37 C) 99.2 F (37.3 C)  98.8 F (37.1 C)  TempSrc: Oral Oral  Oral  SpO2: 97% 97%  97%  Weight:   86.6 kg   Height:       General: Pt is alert, awake, not in acute distress Cardiovascular: RRR, S1/S2 +, no rubs, no gallops Respiratory: CTA bilaterally, no wheezing, no rhonchi Abdominal: Soft, NT, ND, bowel sounds + Extremities: no edema, no cyanosis  The results of significant diagnostics from this hospitalization (including imaging, microbiology, ancillary and laboratory) are listed below for reference.    Microbiology: Recent Results (from the past 240 hour(s))  Respiratory Panel by  RT PCR (Flu A&B, Covid) - Nasopharyngeal Swab     Status: None   Collection Time: 07/06/19  1:36 PM   Specimen: Nasopharyngeal Swab  Result Value Ref Range Status   SARS Coronavirus 2 by RT PCR NEGATIVE NEGATIVE Final    Comment: (NOTE) SARS-CoV-2 target nucleic acids are NOT DETECTED. The SARS-CoV-2 RNA is generally detectable in upper respiratoy specimens during the acute phase of infection. The lowest concentration of SARS-CoV-2 viral copies this assay can detect is 131 copies/mL. A negative result does not preclude SARS-Cov-2 infection and should not be used as the sole basis for treatment or other patient management decisions. A negative result may occur with  improper specimen collection/handling, submission of specimen other than nasopharyngeal swab, presence of viral mutation(s) within the areas targeted by this assay, and inadequate number of viral copies (<131 copies/mL). A negative result must be combined with clinical observations, patient history, and epidemiological information. The expected result is Negative. Fact Sheet for Patients:  PinkCheek.be Fact Sheet for Healthcare Providers:  GravelBags.it This test is not yet ap proved or cleared by the [place] FDA and  has been authorized for detection and/or diagnosis of SARS-CoV-2 by FDA under an Emergency Use Authorization (EUA). This EUA will remain  in effect (meaning this test can be used) for the duration of the COVID-19 declaration under Section 564(b)(1) of the Act, 21 U.S.C. section 360bbb-3(b)(1), unless the authorization is terminated or revoked sooner.    Influenza A by PCR NEGATIVE NEGATIVE Final   Influenza B by PCR NEGATIVE NEGATIVE Final    Comment: (NOTE) The Xpert Xpress SARS-CoV-2/FLU/RSV assay is intended as an aid in  the diagnosis of influenza from Nasopharyngeal swab specimens and  should not be used as a sole basis for treatment.  Nasal washings and  aspirates are unacceptable for Xpert Xpress SARS-CoV-2/FLU/RSV  testing. Fact Sheet for Patients: PinkCheek.be Fact Sheet for Healthcare Providers: GravelBags.it This test is not yet approved or cleared by the [place] FDA and  has been authorized for detection and/or diagnosis of SARS-CoV-2 by  FDA under an Emergency Use Authorization (EUA). This EUA will remain  in effect (meaning this test can be used) for the duration of the  Covid-19 declaration under Section 564(b)(1) of the Act, 21  U.S.C. section 360bbb-3(b)(1), unless the authorization is  terminated or revoked. Performed at [facility], [place] [address], [place], [place] [address]   Culture, blood (routine x 2)     Status: None   Collection Time: 07/06/19  5:46 PM   Specimen: BLOOD LEFT WRIST  Result Value Ref Range Status   Specimen Description BLOOD LEFT WRIST  Final   Special Requests   Final    BOTTLES DRAWN AEROBIC AND ANAEROBIC Blood Culture results may not be optimal due to an inadequate volume of blood received in culture bottles   Culture   Final    NO GROWTH 5 DAYS Performed at [facility], [place] [address], [place], [place] [address]    Report Status 07/11/2019 FINAL  Final  Culture, blood (routine x 2)     Status: None   Collection Time: 07/06/19  5:54 PM   Specimen: BLOOD LEFT HAND  Result Value Ref Range Status   Specimen Description BLOOD LEFT HAND  Final   Special Requests   Final    BOTTLES DRAWN AEROBIC AND ANAEROBIC Blood Culture results may not be optimal due to an inadequate volume of blood received in culture bottles   Culture   Final    NO GROWTH 5 DAYS Performed at [facility], [place] [address], [place], [place] [address]    Report Status 07/11/2019 FINAL  Final  Urine culture     Status: Abnormal   Collection Time: 07/09/19  7:50 AM   Specimen: Urine, Random  Result Value Ref Range Status    Specimen Description URINE, RANDOM  Final   Special Requests   Final    NONE Performed at [facility], [place] [address], [place], [place] [address]    Culture MULTIPLE SPECIES PRESENT, SUGGEST RECOLLECTION (A)  Final   Report Status 07/10/2019 FINAL  Final     Labs: BNP (last 3 results) Recent Labs    11/17/18 0959 04/27/19 1126 06/28/19 1150  BNP 196* 268* [id number]*   Basic Metabolic Panel: Recent Labs  Lab 07/07/19 0820 07/08/19 0533 07/09/19 0603 07/10/19 0231 07/11/19 0330  NA 142 142 142 140 142  K 4.4 4.7 4.5 4.6 4.6  CL 108 107 108 108 106  CO2 24 22 23 22 24   GLUCOSE 99 80 80 109* 90  BUN 59* 61* 62* 58* 56*  CREATININE 4.64* 4.27* 4.10* 3.57* 3.62*  CALCIUM 8.8* 8.3* 8.8* 8.7* 8.7*  MG  --   --   --  2.0 1.9  PHOS  --   --   --  5.0* 4.4   Liver Function Tests: Recent Labs  Lab 07/07/19 0136 07/10/19 0231 07/11/19 0330  AST 12* 12* 11*  ALT 9 8 9   ALKPHOS 105 91 86  BILITOT 0.8 0.7 0.7  PROT 5.5* 5.7* 5.2*  ALBUMIN 2.8* 2.6* 2.4*   No results for input(s): LIPASE, AMYLASE in the last 168 hours. No results for input(s): AMMONIA in the last 168 hours. CBC: Recent Labs  Lab 07/08/19 0533 07/08/19 1222 07/09/19 0603 07/10/19 0231 07/11/19 0330  WBC 10.1 11.2* 8.4 8.6 7.4  HGB 7.8* 8.8* 8.0* 8.4* 8.2*  HCT 24.3* 27.9* 25.5* 26.8* 25.5*  MCV 93.8 95.2 95.1 94.0 94.4  PLT 174 207 198 223 211   Cardiac Enzymes: No results for input(s): CKTOTAL, CKMB, CKMBINDEX, TROPONINI in the last 168 hours. BNP: Invalid input(s): POCBNP CBG: No results for input(s): GLUCAP in the last 168 hours. D-Dimer No results for input(s): DDIMER in the last 72 hours. Hgb A1c No results for input(s): HGBA1C in the last 72 hours. Lipid Profile No results for input(s): CHOL, HDL, LDLCALC, TRIG, CHOLHDL, LDLDIRECT in the last 72 hours. Thyroid function studies No results for input(s): TSH, T4TOTAL, T3FREE, THYROIDAB in the last 72 hours.  Invalid input(s):  FREET3 Anemia work up No results for input(s): VITAMINB12, FOLATE, FERRITIN, TIBC, IRON, RETICCTPCT in the last 72 hours. Urinalysis    Component Value Date/Time   COLORURINE RED (A) 07/06/2019 1939   APPEARANCEUR TURBID (A) 07/06/2019 1939   LABSPEC  07/06/2019 1939    TEST NOT REPORTED DUE TO COLOR INTERFERENCE  OF URINE PIGMENT   PHURINE  07/06/2019 1939    TEST NOT REPORTED DUE TO COLOR INTERFERENCE OF URINE PIGMENT   GLUCOSEU (A) 07/06/2019 1939    TEST NOT REPORTED DUE TO COLOR INTERFERENCE OF URINE PIGMENT   HGBUR (A) 07/06/2019 1939    TEST NOT REPORTED DUE TO COLOR INTERFERENCE OF URINE PIGMENT   BILIRUBINUR (A) 07/06/2019 1939    TEST NOT REPORTED DUE TO COLOR INTERFERENCE OF URINE PIGMENT   BILIRUBINUR neg 05/18/2019 1357   KETONESUR (A) 07/06/2019 1939    TEST NOT REPORTED DUE TO COLOR INTERFERENCE OF URINE PIGMENT   PROTEINUR (A) 07/06/2019 1939    TEST NOT REPORTED DUE TO COLOR INTERFERENCE OF URINE PIGMENT   UROBILINOGEN negative (A) 05/18/2019 1357   UROBILINOGEN 0.2 07/19/2008 1547   NITRITE (A) 07/06/2019 1939    TEST NOT REPORTED DUE TO COLOR INTERFERENCE OF URINE PIGMENT   LEUKOCYTESUR (A) 07/06/2019 1939    TEST NOT REPORTED DUE TO COLOR INTERFERENCE OF URINE PIGMENT   Sepsis Labs Invalid input(s): PROCALCITONIN,  WBC,  LACTICIDVEN Microbiology Recent Results (from the past 240 hour(s))  Respiratory Panel by RT PCR (Flu A&B, Covid) - Nasopharyngeal Swab     Status: None   Collection Time: 07/06/19  1:36 PM   Specimen: Nasopharyngeal Swab  Result Value Ref Range Status   SARS Coronavirus 2 by RT PCR NEGATIVE NEGATIVE Final    Comment: (NOTE) SARS-CoV-2 target nucleic acids are NOT DETECTED. The SARS-CoV-2 RNA is generally detectable in upper respiratoy specimens during the acute phase of infection. The lowest concentration of SARS-CoV-2 viral copies this assay can detect is 131 copies/mL. A negative result does not preclude SARS-Cov-2 infection and  should not be used as the sole basis for treatment or other patient management decisions. A negative result may occur with  improper specimen collection/handling, submission of specimen other than nasopharyngeal swab, presence of viral mutation(s) within the areas targeted by this assay, and inadequate number of viral copies (<131 copies/mL). A negative result must be combined with clinical observations, patient history, and epidemiological information. The expected result is Negative. Fact Sheet for Patients:  PinkCheek.be Fact Sheet for Healthcare Providers:  GravelBags.it This test is not yet ap proved or cleared by the [place] FDA and  has been authorized for detection and/or diagnosis of SARS-CoV-2 by FDA under an Emergency Use Authorization (EUA). This EUA will remain  in effect (meaning this test can be used) for the duration of the COVID-19 declaration under Section 564(b)(1) of the Act, 21 U.S.C. section 360bbb-3(b)(1), unless the authorization is terminated or revoked sooner.    Influenza A by PCR NEGATIVE NEGATIVE Final   Influenza B by PCR NEGATIVE NEGATIVE Final    Comment: (NOTE) The Xpert Xpress SARS-CoV-2/FLU/RSV assay is intended as an aid in  the diagnosis of influenza from Nasopharyngeal swab specimens and  should not be used as a sole basis for treatment. Nasal washings and  aspirates are unacceptable for Xpert Xpress SARS-CoV-2/FLU/RSV  testing. Fact Sheet for Patients: PinkCheek.be Fact Sheet for Healthcare Providers: GravelBags.it This test is not yet approved or cleared by the [place] FDA and  has been authorized for detection and/or diagnosis of SARS-CoV-2 by  FDA under an Emergency Use Authorization (EUA). This EUA will remain  in effect (meaning this test can be used) for the duration of the  Covid-19 declaration under Section  564(b)(1) of the Act, 21  U.S.C. section 360bbb-3(b)(1), unless the authorization is  terminated or  revoked. Performed at [facility], [place] [address] [place], [place] [address]   Culture, blood (routine x 2)     Status: None   Collection Time: 07/06/19  5:46 PM   Specimen: BLOOD LEFT WRIST  Result Value Ref Range Status   Specimen Description BLOOD LEFT WRIST  Final   Special Requests   Final    BOTTLES DRAWN AEROBIC AND ANAEROBIC Blood Culture results may not be optimal due to an inadequate volume of blood received in culture bottles   Culture   Final    NO GROWTH 5 DAYS Performed at [facility], [place] [address] [place], [place] [address]    Report Status 07/11/2019 FINAL  Final  Culture, blood (routine x 2)     Status: None   Collection Time: 07/06/19  5:54 PM   Specimen: BLOOD LEFT HAND  Result Value Ref Range Status   Specimen Description BLOOD LEFT HAND  Final   Special Requests   Final    BOTTLES DRAWN AEROBIC AND ANAEROBIC Blood Culture results may not be optimal due to an inadequate volume of blood received in culture bottles   Culture   Final    NO GROWTH 5 DAYS Performed at [facility], [place] [address] [place], [place] [address]    Report Status 07/11/2019 FINAL  Final  Urine culture     Status: Abnormal   Collection Time: 07/09/19  7:50 AM   Specimen: Urine, Random  Result Value Ref Range Status   Specimen Description URINE, RANDOM  Final   Special Requests   Final    NONE Performed at [facility], [place] [address] [place], [place] [address]    Culture MULTIPLE SPECIES PRESENT, SUGGEST RECOLLECTION (A)  Final   Report Status 07/10/2019 FINAL  Final   Time coordinating discharge: Over 30 minutes  SIGNED:  Hoyt Koch, MD  [facility] 07/11/2019, 10:52 AM Pager   If 7PM-7AM, please contact night-coverage [email or web address] Password [email or web address]

## 2019-07-11 NOTE — Discharge Instructions (Signed)
Make sure to follow up with your regular doctor and kidney specialist. IF you get more bleeding in urine call your doctor. If you have dizziness or lightheadedness seek care.     Hematuria, Adult Hematuria is blood in the urine. Blood may be visible in the urine, or it may be identified with a test. This condition can be caused by infections of the bladder, urethra, kidney, or prostate. Other possible causes include:  Kidney stones.  Cancer of the urinary tract.  Too much calcium in the urine.  Conditions that are passed from parent to child (inherited conditions).  Exercise that requires a lot of energy. Infections can usually be treated with medicine, and a kidney stone usually will pass through your urine. If neither of these is the cause of your hematuria, more tests may be needed to identify the cause of your symptoms. It is very important to tell your health care provider about any blood in your urine, even if it is painless or the blood stops without treatment. Blood in the urine, when it happens and then stops and then happens again, can be a symptom of a very serious condition, including cancer. There is no pain in the initial stages of many urinary cancers. Follow these instructions at home: Medicines  Take over-the-counter and prescription medicines only as told by your health care provider.  If you were prescribed an antibiotic medicine, take it as told by your health care provider. Do not stop taking the antibiotic even if you start to feel better. Eating and drinking  Drink enough fluid to keep your urine clear or pale yellow. It is recommended that you drink 3-4 quarts (2.8-3.8 L) a day. If you have been diagnosed with an infection, it is recommended that you drink cranberry juice in addition to large amounts of water.  Avoid caffeine, tea, and carbonated beverages. These tend to irritate the bladder.  Avoid alcohol because it may irritate the prostate (men). General  instructions  If you have been diagnosed with a kidney stone, follow your health care provider's instructions about straining your urine to catch the stone.  Empty your bladder often. Avoid holding urine for long periods of time.  If you are female: ? After a bowel movement, wipe from front to back and use each piece of toilet paper only once. ? Empty your bladder before and after sex.  Pay attention to any changes in your symptoms. Tell your health care provider about any changes or any new symptoms.  It is your responsibility to get your test results. Ask your health care provider, or the department performing the test, when your results will be ready.  Keep all follow-up visits as told by your health care provider. This is important. Contact a health care provider if:  You develop back pain.  You have a fever.  You have nausea or vomiting.  Your symptoms do not improve after 3 days.  Your symptoms get worse. Get help right away if:  You develop severe vomiting and are unable take medicine without vomiting.  You develop severe pain in your back or abdomen even though you are taking medicine.  You pass a large amount of blood in your urine.  You pass blood clots in your urine.  You feel very weak or like you might faint.  You faint. Summary  Hematuria is blood in the urine. It has many possible causes.  It is very important that you tell your health care provider about any blood  in your urine, even if it is painless or the blood stops without treatment.  Take over-the-counter and prescription medicines only as told by your health care provider.  Drink enough fluid to keep your urine clear or pale yellow. This information is not intended to replace advice given to you by your health care provider. Make sure you discuss any questions you have with your health care provider. Document Revised: 10/06/2018 Document Reviewed: 06/14/2016 Elsevier Patient Education  2020  [employer].

## 2019-07-11 NOTE — Telephone Encounter (Signed)
Ok to order 

## 2019-07-11 NOTE — Telephone Encounter (Signed)
Order written

## 2019-07-11 NOTE — Progress Notes (Signed)
Pt observed throughout shift for safety. Slept well. No s/s of distress. Call bell within reach.

## 2019-07-11 NOTE — Telephone Encounter (Signed)
CB# [phone]  Daughter is requesting a prescription for a Transport wheelchair called into [facility] if anyway possible they are taking patient somewhere tomorrow if this can be called in by in the morning.

## 2019-07-11 NOTE — Telephone Encounter (Signed)
Called and spoke with patient's daughter to follow up with her post her hospital discharge from 07/06/2019-07/11/2019. Patient's daughter states that patient is doing ok. She is currently unable to walk due to weakness in her legs. She also reports decreased appetite. They understands instructions from Hospital discharge and have no additional questions. Hospital follow up scheduled for next week on 07/19/2019 at 9 am.

## 2019-07-11 NOTE — TOC Transition Note (Signed)
Transition of Care [facility]) - CM/SW Discharge Note   Patient Details  Name: [name] MRN: [id number] Date of Birth: June 10, 1923  [facility] CM/SW Contact:  Alexander Mt, LCSW Phone Number:  07/11/2019, 10:48 AM   Clinical Narrative:    Contacted by bedside RN Lexi that pt stable for discharge. HHPT referral was accepted by [facility] last week, will alert them of discharge today. Called and spoke with pt daughter Rodney Cruise via telephone, she is aware of discharge and [place] arrangements. Pt family requesting rollator to be ordered. CSW has called and requested delivery to room by [facility]. All questions answered.   Final next level of care: [place] Barriers to Discharge: Barriers Resolved   Patient Goals and CMS Choice Patient states their goals for this hospitalization and ongoing recovery are:: to go home CMS Medicare.gov Compare Post Acute Care list provided to:: Patient Choice offered to / list presented to : Patient  Discharge Placement        Patient chooses bed at: (home) Patient to be transferred to facility by: personal car Name of family member notified: pt daughter Rodney Cruise via telephone Patient and family notified of of transfer: 07/11/19  Discharge Plan and Services   Discharge Planning Services: CM Consult Post Acute Care Choice: Home Health          DME Arranged: Walker rolling with seat DME Agency: [facility] Date DME Agency Contacted: 07/11/19 Time DME Agency Contacted: [id number] Representative spoke with at DME Agency: [place]: PT [place]: [place] Date [place]: 07/11/19 Time [place]: [place] Representative spoke with at [place]: Adela Lank   Readmission Risk Interventions No flowsheet data found.

## 2019-07-11 NOTE — Progress Notes (Signed)
Cammie Mcgee Stalker to be D/C'd home per MD order.  Discussed medications and follow up appointments with the patient and her daughter. medication list explained in detail. Pt and daughter verbalized understanding.  Allergies as of 07/11/2019   No Known Allergies     Medication List    TAKE these medications   acetaminophen 500 MG tablet Commonly known as: TYLENOL Take 1,500 mg by mouth every 6 (six) hours as needed for mild pain.   allopurinol 300 MG tablet Commonly known as: ZYLOPRIM TAKE 1 TABLET BY MOUTH  DAILY WITH BREAKFAST What changed: when to take this   amLODipine 5 MG tablet Commonly known as: NORVASC TAKE 1 TABLET BY MOUTH  DAILY   diclofenac sodium 1 % Gel Commonly known as: VOLTAREN Apply 2 g topically 4 (four) times daily.   dorzolamide-timolol 22.3-6.8 MG/ML ophthalmic solution Commonly known as: COSOPT Place 1 drop into the right eye 2 (two) times daily.   furosemide 40 MG tablet Commonly known as: LASIX TAKE ONE-HALF TABLET BY  MOUTH DAILY   hydrALAZINE 10 MG tablet Commonly known as: APRESOLINE TAKE 1 TABLET BY MOUTH  TWICE DAILY   omeprazole 40 MG capsule Commonly known as: PRILOSEC TAKE 1 CAPSULE BY MOUTH  DAILY FOR REFLUX What changed: See the new instructions.   pravastatin 20 MG tablet Commonly known as: PRAVACHOL TAKE 1 TABLET BY MOUTH IN  THE EVENING What changed: when to take this   QUEtiapine 25 MG tablet Commonly known as: SEROQUEL Take 25 mg by mouth at bedtime.   timolol 0.5 % ophthalmic solution Commonly known as: BETIMOL Place 1 drop into the left eye daily.   Vitamin D (Ergocalciferol) 1.25 MG (50000 UNIT) Caps capsule Commonly known as: DRISDOL Take 1 capsule by mouth once a week.            Durable Medical Equipment  (From admission, onward)         Start     Ordered   07/11/19 1048  For home use only DME 4 wheeled rolling walker with seat  Once    Question Answer Comment  Patient needs a walker to treat with the  following condition Hematuria   Patient needs a walker to treat with the following condition Chronic diastolic (congestive) heart failure ([place])      07/11/19 1048          Vitals:   07/11/19 0545 07/11/19 0937  BP: (!) 125/59 (!) 131/59  Pulse: 71 67  Resp: 16 18  Temp: 99.2 F (37.3 C) 98.8 F (37.1 C)  SpO2: 97% 97%    IV catheter discontinued intact. Site without signs and symptoms of complications. Dressing and pressure applied. Pt denies pain at this time. No complaints noted. DME delivered.  An After Visit Summary was printed and given to the patient. Patient escorted via [place], and D/C home via private auto with daughter.  [place] 07/11/2019 12:12 PM

## 2019-07-11 NOTE — Progress Notes (Signed)
Occupational Therapy Treatment Patient Details Name: [name] MRN: [id number] DOB: 01-21-1924 Today's Date: 07/11/2019    History of present illness Pt is a 86 y/o female admitted secondary to shortness of breath and weakness. Found to have hematuria following renal biopsy. Imaging showed L kidney perinephric hematoma. PMH includes dCHF, HTN, glaucoma, CKD, and gout.    OT comments  Patient continues to make steady progress towards goals in skilled OT session. Patient's session encompassed ADL's at the sink, attempted simulated tub transfer, and ambulating of household distances. Pt handed off from PT as patient was at the sink wanting to get washed up. Pt completed washing face and brushing teeth with set up, electing to stand to complete tasks to date. Pt notes that she typically sits, however elected to stand and balanced with bilateral forearms supported at sink. Pt able to ambulate minimal household distances with RW and increased time due to fatigue. Attempted a shower transfer (tub bench unavailable) and pt unable to clear leg in order to step up into shower and required increased cues due to safety and increasing fatigue. Pt left in chair with PT present to complete further BLE exercises. Will continue to follow acutely.    Follow Up Recommendations  Home health OT;Supervision/Assistance - 24 hour    Equipment Recommendations  None recommended by OT    Recommendations for Other Services      Precautions / Restrictions Precautions Precautions: Fall Restrictions Weight Bearing Restrictions: No       Mobility Bed Mobility Overal bed mobility: Needs Assistance Bed Mobility: Supine to Sit     Supine to sit: Supervision     General bed mobility comments: No assist and no use of rails. HOB mildly elevated.   Transfers Overall transfer level: Needs assistance Equipment used: Rolling walker (2 wheeled) Transfers: Sit to/from Stand Sit to Stand: Min guard Stand pivot  transfers: Min guard       General transfer comment: Min gaurd for safety, kyphotic posture when ambulating, limited due to fatigue    Balance Overall balance assessment: Needs assistance Sitting-balance support: No upper extremity supported;Feet supported Sitting balance-Leahy Scale: Fair     Standing balance support: Bilateral upper extremity supported;During functional activity Standing balance-Leahy Scale: Poor Standing balance comment: Reliant on BUE support                           ADL either performed or assessed with clinical judgement   ADL Overall ADL's : Needs assistance/impaired     Grooming: Wash/dry hands;Wash/dry face;Min guard;Standing                   Toilet Transfer: Minimal assistance;Min guard;Ambulation;RW;Cueing for safety Toilet Transfer Details (indicate cue type and reason): Simulated with chair     Tub/ Shower Transfer: Minimal assistance;Moderate assistance;Rolling walker;Walk-in shower;Cueing for safety;Cueing for sequencing Tub/Shower Transfer Details (indicate cue type and reason): Family has tub bench at home, however attempted to transfer to shower with 6 inch ledge, unable to bring foot high enough to clear, increased cues for safety and sequencing due to fatigue Functional mobility during ADLs: Minimal assistance;Min guard;Cueing for safety;Rolling walker       Vision Baseline Vision/History: Wears glasses Patient Visual Report: No change from baseline     Perception     Praxis      Cognition Arousal/Alertness: Awake/alert Behavior During Therapy: WFL for tasks assessed/performed Overall Cognitive Status: Within Functional Limits for tasks assessed  Exercises     Shoulder Instructions       General Comments      Pertinent Vitals/ Pain       Pain Assessment: No/denies pain  Home Living                                           Prior Functioning/Environment              Frequency  Min 2X/week        Progress Toward Goals  OT Goals(current goals can now be found in the care plan section)  Progress towards OT goals: Progressing toward goals  Acute Rehab OT Goals Patient Stated Goal: Go home OT Goal Formulation: With patient Time For Goal Achievement: 07/22/19 Potential to Achieve Goals: Good  Plan Discharge plan remains appropriate    Co-evaluation                 AM-PAC OT "6 Clicks" Daily Activity     Outcome Measure   Help from another person eating meals?: None Help from another person taking care of personal grooming?: A Little Help from another person toileting, which includes using toliet, bedpan, or urinal?: A Lot Help from another person bathing (including washing, rinsing, drying)?: A Lot Help from another person to put on and taking off regular upper body clothing?: A Little Help from another person to put on and taking off regular lower body clothing?: A Lot 6 Click Score: 16    End of Session Equipment Utilized During Treatment: Gait belt;Rolling walker  OT Visit Diagnosis: Unsteadiness on feet (R26.81);Muscle weakness (generalized) (M62.81)   Activity Tolerance Patient tolerated treatment well   Patient Left in chair;with call bell/phone within reach;Other (comment)(hand off to PT at end of session)   Nurse Communication          [id number] OT Time Calculation (min): 10 min  Charges: OT General Charges $OT Visit: 1 Visit OT Treatments $Self Care/Home Management : 8-22 mins  Corinne Ports E. Jennika Ringgold, COTA/L Acute Rehabilitation Services [phone] [place] 07/11/2019, 10:19 AM

## 2019-07-12 ENCOUNTER — Telehealth: Payer: Self-pay | Admitting: *Deleted

## 2019-07-12 DIAGNOSIS — R2681 Unsteadiness on feet: Secondary | ICD-10-CM | POA: Insufficient documentation

## 2019-07-12 NOTE — Telephone Encounter (Signed)
Call placed to patient daughter [name]. Reports that she only requires new wheelchair for transfers.   Notes sent to DME.

## 2019-07-12 NOTE — Telephone Encounter (Signed)
You will have to ask family if they need a walker Im pretty sure  she has a rollator  Note addendum done

## 2019-07-12 NOTE — Telephone Encounter (Signed)
Discussion placed with fellow CMA. Reports that daughter stated DME provider requested diagnosis.   Advised that ICD 10's are noted on prescription and notes were also sent with prescription.   Advised that if DME requires additional information is required, they will contact our office.

## 2019-07-12 NOTE — Telephone Encounter (Signed)
Daughter called and stated that [place] needs a Dx code for the transport wheelchair. She states you can call Margarita Grizzle at [employer] with info.

## 2019-07-12 NOTE — Telephone Encounter (Signed)
Received call from Cecille Rubin, [place] with [facility].   Reports that order has been received for transfer wheelchair, but she requires face to face notes from within the last 6 months describing patient need for new wheelchair. States that notes must specifically state that patient requires WC with drop arm for ease of transfers.   Advised that recent note can be addended if MD prefers.   Also states that hospital D/C summary that she received states that patient is using walker. Inquired as to if she needs a new walker as well.

## 2019-07-12 NOTE — Assessment & Plan Note (Addendum)
Transfer wheelchair with  drop arm needed  for ease of transfers. This will help care givers within the home and with transporting to appointments

## 2019-07-13 DIAGNOSIS — E559 Vitamin D deficiency, unspecified: Secondary | ICD-10-CM | POA: Diagnosis not present

## 2019-07-13 DIAGNOSIS — N3289 Other specified disorders of bladder: Secondary | ICD-10-CM | POA: Diagnosis not present

## 2019-07-13 DIAGNOSIS — E211 Secondary hyperparathyroidism, not elsewhere classified: Secondary | ICD-10-CM | POA: Diagnosis not present

## 2019-07-13 DIAGNOSIS — D631 Anemia in chronic kidney disease: Secondary | ICD-10-CM | POA: Diagnosis not present

## 2019-07-13 DIAGNOSIS — I0981 Rheumatic heart failure: Secondary | ICD-10-CM | POA: Diagnosis not present

## 2019-07-13 DIAGNOSIS — R809 Proteinuria, unspecified: Secondary | ICD-10-CM | POA: Diagnosis not present

## 2019-07-13 DIAGNOSIS — I132 Hypertensive heart and chronic kidney disease with heart failure and with stage 5 chronic kidney disease, or end stage renal disease: Secondary | ICD-10-CM | POA: Diagnosis not present

## 2019-07-13 DIAGNOSIS — D62 Acute posthemorrhagic anemia: Secondary | ICD-10-CM | POA: Diagnosis not present

## 2019-07-13 DIAGNOSIS — N185 Chronic kidney disease, stage 5: Secondary | ICD-10-CM | POA: Diagnosis not present

## 2019-07-15 DIAGNOSIS — D62 Acute posthemorrhagic anemia: Secondary | ICD-10-CM | POA: Diagnosis not present

## 2019-07-15 DIAGNOSIS — I132 Hypertensive heart and chronic kidney disease with heart failure and with stage 5 chronic kidney disease, or end stage renal disease: Secondary | ICD-10-CM | POA: Diagnosis not present

## 2019-07-15 DIAGNOSIS — N3289 Other specified disorders of bladder: Secondary | ICD-10-CM | POA: Diagnosis not present

## 2019-07-15 DIAGNOSIS — I0981 Rheumatic heart failure: Secondary | ICD-10-CM | POA: Diagnosis not present

## 2019-07-16 DIAGNOSIS — M48061 Spinal stenosis, lumbar region without neurogenic claudication: Secondary | ICD-10-CM | POA: Diagnosis not present

## 2019-07-16 DIAGNOSIS — R2681 Unsteadiness on feet: Secondary | ICD-10-CM | POA: Diagnosis not present

## 2019-07-16 DIAGNOSIS — G6289 Other specified polyneuropathies: Secondary | ICD-10-CM | POA: Diagnosis not present

## 2019-07-16 DIAGNOSIS — N184 Chronic kidney disease, stage 4 (severe): Secondary | ICD-10-CM | POA: Diagnosis not present

## 2019-07-18 DIAGNOSIS — I132 Hypertensive heart and chronic kidney disease with heart failure and with stage 5 chronic kidney disease, or end stage renal disease: Secondary | ICD-10-CM | POA: Diagnosis not present

## 2019-07-18 DIAGNOSIS — D62 Acute posthemorrhagic anemia: Secondary | ICD-10-CM | POA: Diagnosis not present

## 2019-07-18 DIAGNOSIS — D631 Anemia in chronic kidney disease: Secondary | ICD-10-CM | POA: Diagnosis not present

## 2019-07-18 DIAGNOSIS — E211 Secondary hyperparathyroidism, not elsewhere classified: Secondary | ICD-10-CM | POA: Diagnosis not present

## 2019-07-18 DIAGNOSIS — E559 Vitamin D deficiency, unspecified: Secondary | ICD-10-CM | POA: Diagnosis not present

## 2019-07-18 DIAGNOSIS — N3289 Other specified disorders of bladder: Secondary | ICD-10-CM | POA: Diagnosis not present

## 2019-07-18 DIAGNOSIS — E1122 Type 2 diabetes mellitus with diabetic chronic kidney disease: Secondary | ICD-10-CM | POA: Diagnosis not present

## 2019-07-18 DIAGNOSIS — N185 Chronic kidney disease, stage 5: Secondary | ICD-10-CM | POA: Diagnosis not present

## 2019-07-18 DIAGNOSIS — I0981 Rheumatic heart failure: Secondary | ICD-10-CM | POA: Diagnosis not present

## 2019-07-18 LAB — SURGICAL PATHOLOGY

## 2019-07-19 ENCOUNTER — Encounter: Payer: Self-pay | Admitting: Family Medicine

## 2019-07-19 ENCOUNTER — Other Ambulatory Visit: Payer: Self-pay

## 2019-07-19 ENCOUNTER — Ambulatory Visit (INDEPENDENT_AMBULATORY_CARE_PROVIDER_SITE_OTHER): Payer: Medicare Other | Admitting: Family Medicine

## 2019-07-19 VITALS — BP 138/74 | HR 96 | Temp 97.9°F | Resp 16 | Ht 64.0 in | Wt 172.0 lb

## 2019-07-19 DIAGNOSIS — I5032 Chronic diastolic (congestive) heart failure: Secondary | ICD-10-CM | POA: Diagnosis not present

## 2019-07-19 DIAGNOSIS — N3289 Other specified disorders of bladder: Secondary | ICD-10-CM | POA: Diagnosis not present

## 2019-07-19 DIAGNOSIS — E119 Type 2 diabetes mellitus without complications: Secondary | ICD-10-CM

## 2019-07-19 DIAGNOSIS — I0981 Rheumatic heart failure: Secondary | ICD-10-CM | POA: Diagnosis not present

## 2019-07-19 DIAGNOSIS — N185 Chronic kidney disease, stage 5: Secondary | ICD-10-CM | POA: Diagnosis not present

## 2019-07-19 DIAGNOSIS — R609 Edema, unspecified: Secondary | ICD-10-CM

## 2019-07-19 DIAGNOSIS — D62 Acute posthemorrhagic anemia: Secondary | ICD-10-CM | POA: Diagnosis not present

## 2019-07-19 DIAGNOSIS — I132 Hypertensive heart and chronic kidney disease with heart failure and with stage 5 chronic kidney disease, or end stage renal disease: Secondary | ICD-10-CM | POA: Diagnosis not present

## 2019-07-19 NOTE — Progress Notes (Signed)
Subjective:    Patient ID: [name], female    DOB: [birth year], 86 y.o.   MRN: [id number]  Patient presents for Hospital F/U (hematuria/ hallucinations)  Patient here for hospital follow-up.  She was admitted to the hospital again secondary to gross hematuria along with ongoing altered mental status hallucinations.  She was started on Seroquel by nephrology but her symptoms continue to worsen.  She underwent renal biopsy 3 days prior to admission to help determine why she is having significant renal failure setting of the hematuria.  UnFortunately her hemoglobin dropped to 7.1 she required 2 unit of packed red blood cells in the emergency room.  At discharge her hemoglobin was 8.2.  She was discharged with home health and PT.  There was no sign of infection during admission. Last visit with nephrology was last week on the 17th.  Her creatinine is currently 3.6.  Her biopsy showed anin adequate sample with chronic changes but still no specific diagnosis for her renal failure. On review of nephrology note  impression kidney injury secondary to ATN with chronic kidney disease stage V.  Possible contribution from obstructive uropathy due to history of kidney stones. It was noted that she had increased swelling her lower extremities since her hospitalization so her Lasix was increased to 40 mg daily.  She had repeat labs done yesterday which I will review on Friday when she had her visit with nephrology.  She denies any further blood in her urine her daughter also states that there has been no further blood if she empties the urinal.  Her bowels have been good. Appetite is slowly increase and she is drinking boost/ ensure  daily.  She is not had any further hallucinations she states that she slept the best last night.  They have not given her any Seroquel the past few nights.      Hypertension blood pressure has been controlled throughout this ordeal.  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 supplementation    Anemia of chronic disease nephrology is considering starting Epogen  Today 172 pounds last weight in my office on February 2 was 177 pounds  DM- last A1C 5.9% in December, diet controlled    Review Of Systems:  GEN- + fatigue,  Denies fever, weight loss,weakness, recent illness HEENT- denies eye drainage, change in vision, nasal discharge, CVS- denies chest pain, palpitations RESP- a little  SOB, cough, wheeze ABD- denies N/V, change in stools, abd pain GU- denies dysuria, hematuria, dribbling, incontinence MSK- denies joint pain, muscle aches, injury Neuro- denies headache, dizziness, syncope, seizure activity       Objective:    BP 138/74   Pulse 96   Temp 97.9 F (36.6 C) (Temporal)   Resp 16   Ht 5\' 4"  (1.626 m)   Wt 172 lb (78 kg)   SpO2 98%   BMI 29.52 kg/m  GEN- NAD, alert and oriented x3 , able to recall details of her nephrology visit on her own able to recall her medications without them being present HEENT- PERRLEOMI,  Neck- Supple, no thyromegaly, no JVD CVS- RRR, no murmur RESP-CTAB ABD-NABS,soft,NT,ND Psych- normal affect and mood  EXT- chronic venous stais changes , mild edema non pitting , left hand bruising forearm and wrist (? IV site) mild swelling  Pulses- Radial 2+, DP- 1+,  walks with assistance of walker Neuro-CNII-XII grossly in tact, no jerks or shaking noted     Assessment & Plan:    Expect bruising from the IV to resolve over the next week.  Significant hematoma, she has very thin skin. Problem List Items Addressed This Visit      Unprioritized   Chronic diastolic CHF (congestive heart failure) ([place]) - Primary    Cardiac wise she seems to be at her baseline.  Her blood pressure is controlled her weight is down 5 pounds.  I do think that  she had some fluid retention from both the transfusion and IV fluids.  She had labs drawn yesterday nephrology will follow this up Friday to see how her renal function responded to the increased dose of Lasix.  Follow-up with cardiology tomorrow.      CKD (chronic kidney disease) stage 5, GFR less than 15 ml/min (HCC)    Very complex situation, they did discuss hemodialysis but she is high risk because of her age and cardiovascular risk factors.      Diabetes mellitus, type II ([place])    Diet controlled      Peripheral edema    I have provided her with a prescription for compression hose.  The hallucinations have resolved.  She adamantly remembers seeing her sister as well as her dog in various places.  States that she has not had that in the past week or so.  They are holding on the Seroquel for now..  She had some type of  uremia going on         Note: This dictation was prepared with Dragon dictation along with smaller phrase technology. Any transcriptional errors that result from this process are unintentional.

## 2019-07-19 NOTE — Patient Instructions (Signed)
F/U 4 months  

## 2019-07-19 NOTE — Assessment & Plan Note (Signed)
Diet controlled.  

## 2019-07-19 NOTE — Assessment & Plan Note (Signed)
Very complex situation, they did discuss hemodialysis but she is high risk because of her age and cardiovascular risk factors.

## 2019-07-19 NOTE — Progress Notes (Signed)
Cardiology Office Note:    Date:  07/20/2019   ID:  [name], DOB 05/22/1924, MRN [id number]  PCP:  Alycia Rossetti, MD  Cardiologist:  Sinclair Grooms, MD   Referring MD: Alycia Rossetti, MD   Chief Complaint  Patient presents with  . Congestive Heart Failure    History of Present Illness:    [name] is a 86 y.o. female with a hx of diastolic heart failure, diabetes, chronic kidney disease, hypertension, hyperlipidemia, prior stroke, spinal stenosis, bradycardia.   Having difficulty with extreme dyspnea on exertion.  Earlier this month, has been having significant swelling.  Saw Dr. Mikey College (nephrology).  She has not had chest discomfort.  Swelling is improving on 40 mg of furosemide per day.  She has a follow-up with Dr. Villa Herb on Monday.  She and a daughter further ask if I feel she could make it through dialysis.  I encouraged them to consider how she wants to live out the remaining months and years of her life.  Dialysis changes her existence with the need to spend significant time at dialysis however it will help her to feel better initially relative to dyspnea and volume overload.  Improvement in longevity is another matter.  At her age, with dialysis, short and near term prognosis and longevity would be guarded.  It would also not be incorrect to decide against any aggressive measures at her age and consider palliative care and hospice consults to delineate goals of care and be set up for future decline.  Past Medical History:  Diagnosis Date  . Arthritis   . Bilateral carpal tunnel syndrome   . Cataracts, both eyes   . Chronic diastolic CHF (congestive heart failure) ([place])    a. Echo (09/2012): Mild LVH, EF 65-70%, moderately elevated RVSP, moderate TR, mild to moderate MR, mild LAE, grade 2 diastolic dysfunction  . Diabetes mellitus [facility]  . Diabetic neuropathy (HCC) both hands and legs  . GERD (gastroesophageal reflux disease)   . Glaucoma   .  Gout   . History of echocardiogram    Echo 1/19:  EF 65-70, no RWMA, Gr 2 DD, mild MR, severe LAE, PASP 62   . History of falling RECENT FALL 1 WK AGO--   NO INJURY  . 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 History of transfusion   . Hyperlipidemia   . Hypertension   . Mitral regurgitation   . PUD (peptic ulcer disease)    many yrs ago  . Right leg weakness   . Rotator cuff tear    left  . Slipped intervertebral disc   . Walker as ambulation aid     Past Surgical History:  Procedure Laterality Date  . ABDOMINAL HYSTERECTOMY  1966  . APPENDECTOMY    . CARDIOVASCULAR STRESS TEST  06-18-2005   DR Daneen Schick   NORMAL STUDY/ NO EVIDENCE ISCHEMIA/ EF 75%  . CARPAL TUNNEL RELEASE  09/17/2011   Procedure: CARPAL TUNNEL RELEASE;  Surgeon: Magnus Sinning, MD;  Location: [facility];  Service: Orthopedics;  Laterality: Right;  . CARPAL TUNNEL RELEASE  10/22/2011   Procedure: CARPAL TUNNEL RELEASE;  Surgeon: Magnus Sinning, MD;  Location: [facility];  Service: Orthopedics;  Laterality: Left;  . EYE SURGERY    . LUMBAR LAMINECTOMY/DECOMPRESSION MICRODISCECTOMY  01/14/2012   Procedure: LUMBAR LAMINECTOMY/DECOMPRESSION MICRODISCECTOMY 3 LEVELS;  Surgeon: Laurice Record Aplington,  MD;  Location: [facility];  Service: Orthopedics;  Laterality: N/A;  Decompression Laminectomy of L2 - L3, L3 - L4 and L4 - L5 Central  (X-Ray)  . SHOULDER OPEN ROTATOR CUFF REPAIR  06/02/2012   Procedure: ROTATOR CUFF REPAIR SHOULDER OPEN;  Surgeon: Magnus Sinning, MD;  Location: [facility];  Service: Orthopedics;  Laterality: Left;  Left Shoulder Open Distal Clavicle Resection Anterior Acrominectomy and Rotator Cuff Repair  . SPINE SURGERY    . TRANSTHORACIC ECHOCARDIOGRAM  12-26-2008  DR Daneen Schick   NORMAL LVF/  EF  71%/   MILD ASYMMETRIC SEPTAL HYPERTROPHY/ MILD LEFT ATRIAL ENLARGEMENT/ MODERATELY ELEVATED ESTIMATED RIGHT VENTRICULAR SYSTOLIC PRESSURE/ MILD MITRAL  &   TRICUSPID VALVE REGURG.    Current Medications: Current Meds  Medication Sig  . acetaminophen (TYLENOL) 500 MG tablet Take 1,500 mg by mouth every 6 (six) hours as needed for mild pain.  Marland Kitchen allopurinol (ZYLOPRIM) 300 MG tablet TAKE 1 TABLET BY MOUTH  DAILY WITH BREAKFAST  . amLODipine (NORVASC) 5 MG tablet TAKE 1 TABLET BY MOUTH  DAILY  . dorzolamide-timolol (COSOPT) 22.3-6.8 MG/ML ophthalmic solution Place 1 drop into the right eye 2 (two) times daily.  . furosemide (LASIX) 40 MG tablet Take 40 mg by mouth daily.  . hydrALAZINE (APRESOLINE) 10 MG tablet TAKE 1 TABLET BY MOUTH  TWICE DAILY  . omeprazole (PRILOSEC) 40 MG capsule TAKE 1 CAPSULE BY MOUTH  DAILY FOR REFLUX  . pravastatin (PRAVACHOL) 20 MG tablet TAKE 1 TABLET BY MOUTH IN  THE EVENING  . QUEtiapine (SEROQUEL) 25 MG tablet Take 25 mg by mouth at bedtime.  . timolol (BETIMOL) 0.5 % ophthalmic solution Place 1 drop into the left eye daily.  . Vitamin D, Ergocalciferol, (DRISDOL) 1.25 MG (50000 UNIT) CAPS capsule Take 1 capsule by mouth once a week.     Allergies:   Patient has no known allergies.   Social History   Socioeconomic History  . Marital status: Widowed    Spouse name: Not on file  . Number of children: Not on file  . Years of education: Not on file  . Highest education level: Not on file  Occupational History  . Not on file  Tobacco Use  . Smoking status: Former Smoker    Packs/day: 1.00    Years: 30.00    Pack years: 30.00    Types: Cigarettes    Quit date: 05/28/1968    Years since quitting: 51.1  . Smokeless tobacco: Never Used  Substance and Sexual Activity  . Alcohol use: No  . Drug use: No  . Sexual activity: Not on file  Other Topics Concern  . Not on file  Social History Narrative  . Not on file   Social Determinants of Health   Financial Resource Strain:   . Difficulty of Paying Living Expenses: Not on file  Food Insecurity:   . Worried About Charity fundraiser in the Last Year: Not on  file  . Ran Out of Food in the Last Year: Not on file  Transportation Needs:   . Lack of Transportation (Medical): Not on file  . Lack of Transportation (Non-Medical): Not on file  Physical Activity:   . Days of Exercise per Week: Not on file  . Minutes of Exercise per Session: Not on file  Stress:   . Feeling of Stress : Not on file  Social Connections:   . Frequency of Communication with Friends and Family: Not on file  . Frequency  of Social Gatherings with Friends and Family: Not on file  . Attends Religious Services: Not on file  . Active Member of Clubs or Organizations: Not on file  . Attends Archivist Meetings: Not on file  . Marital Status: Not on file     Family History: The patient's family history includes Alcohol abuse in her brother; Arthritis in her sister, sister, sister, sister, and sister; Cancer in her sister; Diabetes in her sister; Heart attack in her son; Hypertension in her brother, father, mother, sister, sister, and sister; Stroke in her mother. There is no history of Colon cancer.  ROS:   Please see the history of present illness.    She is frightened about having dialysis.  She does understand from our conversation today that dialysis could allow her to feel better.  I also made it clear that she controls the decision about whether dialysis should be done.  Without dialysis she would eventually begin to feel even worse than now and will need to make plans so that unnecessary advanced therapies such as CPR and mechanical ventilation are avoided.  All other systems reviewed and are negative.  EKGs/Labs/Other Studies Reviewed:    The following studies were reviewed today: None  EKG:  EKG None today.   Recent Labs: 06/28/2019: Brain Natriuretic Peptide 428 07/06/2019: TSH 0.912 07/11/2019: ALT 9; BUN 56; Creatinine, Ser 3.62; Hemoglobin 8.2; Magnesium 1.9; Platelets 211; Potassium 4.6; Sodium 142  Recent Lipid Panel    Component Value Date/Time    CHOL 162 11/17/2018 0959   TRIG 165 (H) 11/17/2018 0959   HDL 45 (L) 11/17/2018 0959   CHOLHDL 3.6 11/17/2018 0959   VLDL 36 (H) 01/13/2017 0825   LDLCALC 91 11/17/2018 0959    Physical Exam:    VS:  BP (!) 118/58   Pulse 66   Ht 5\' 4"  (1.626 m)   Wt 188 lb (85.3 kg)   SpO2 98%   BMI 32.27 kg/m     Wt Readings from Last 3 Encounters:  07/20/19 188 lb (85.3 kg)  07/19/19 172 lb (78 kg)  07/11/19 190 lb 14.7 oz (86.6 kg)     GEN: Moderate obesity. No acute distress HEENT: Normal NECK: No JVD. LYMPHATICS: No lymphadenopathy CARDIAC: S for gallop is audible.  RRR without murmur, or edema. VASCULAR:  Normal Pulses. No bruits. RESPIRATORY:  Clear to auscultation without rales, wheezing or rhonchi  ABDOMEN: Soft, non-tender, non-distended, No pulsatile mass, MUSCULOSKELETAL: No deformity  SKIN: Warm and dry NEUROLOGIC:  Alert and oriented x 3 PSYCHIATRIC:  Normal affect   ASSESSMENT:    1. Chronic diastolic CHF (congestive heart failure) ([place])   2. Essential hypertension   3. Chronic kidney disease (CKD), stage V ([place])   4. DOE (dyspnea on exertion)   5. Educated about COVID-19 virus infection    PLAN:    In order of problems listed above:  1. Volume overload is present.  She is improved on the higher dose of furosemide.  Probably needs more volume removal.  I would defer to Dr. But tiny.  I explained this to the patient and daughter.  Also explained the mechanism of heart failure in this situation being a stiff heart that causes high pressure in the lungs along with volume overload related to failing kidneys. 2. Blood pressure is controlled 3. Stable with improvement in volume status on higher intensity loop diuretic.  This may aggravate kidney function but diuresis as needed to control volume is key so  that she does not suffer unnecessarily from heart failure.  She is not sure if she will except dialysis. 4. Secondary to cardiorenal syndrome in the setting of chronic  diastolic heart failure and stage IV/V CKD. 5. Covid vaccine is advocated and social distancing with mask wearing is being practiced.   Medication Adjustments/Labs and Tests Ordered: Current medicines are reviewed at length with the patient today.  Concerns regarding medicines are outlined above.  No orders of the defined types were placed in this encounter.  No orders of the defined types were placed in this encounter.   Patient Instructions  Medication Instructions:  Your physician recommends that you continue on your current medications as directed. Please refer to the Current Medication list given to you today.  *If you need a refill on your cardiac medications before your next appointment, please call your pharmacy*  Lab Work: None If you have labs (blood work) drawn today and your tests are completely normal, you will receive your results only by: Marland Kitchen MyChart Message (if you have MyChart) OR . A paper copy in the mail If you have any lab test that is abnormal or we need to change your treatment, we will call you to review the results.  Testing/Procedures: None  Follow-Up: At [facility], you and your health needs are our priority.  As part of our continuing mission to provide you with exceptional heart care, we have created designated Provider Care Teams.  These Care Teams include your primary Cardiologist (physician) and Advanced Practice Providers (APPs -  Physician Assistants and Nurse Practitioners) who all work together to provide you with the care you need, when you need it.  Your next appointment:   12 month(s)  The format for your next appointment:   In Person  Provider:   You may see Sinclair Grooms, MD or one of the following Advanced Practice Providers on your designated Care Team:    Truitt Merle, NP  Cecilie Kicks, NP  Kathyrn Drown, NP   Other Instructions      Signed, Sinclair Grooms, MD  07/20/2019 1:19 PM    [place]

## 2019-07-19 NOTE — Assessment & Plan Note (Signed)
Cardiac wise she seems to be at her baseline.  Her blood pressure is controlled her weight is down 5 pounds.  I do think that she had some fluid retention from both the transfusion and IV fluids.  She had labs drawn yesterday nephrology will follow this up Friday to see how her renal function responded to the increased dose of Lasix.  Follow-up with cardiology tomorrow.

## 2019-07-19 NOTE — Assessment & Plan Note (Addendum)
I have provided her with a prescription for compression hose.  The hallucinations have resolved.  She adamantly remembers seeing her sister as well as her dog in various places.  States that she has not had that in the past week or so.  They are holding on the Seroquel for now..  She had some type of  uremia going on

## 2019-07-20 ENCOUNTER — Ambulatory Visit: Payer: Medicare Other | Admitting: Interventional Cardiology

## 2019-07-20 ENCOUNTER — Ambulatory Visit (INDEPENDENT_AMBULATORY_CARE_PROVIDER_SITE_OTHER): Payer: Medicare Other | Admitting: Interventional Cardiology

## 2019-07-20 ENCOUNTER — Encounter: Payer: Self-pay | Admitting: Interventional Cardiology

## 2019-07-20 VITALS — BP 118/58 | HR 66 | Ht 64.0 in | Wt 188.0 lb

## 2019-07-20 DIAGNOSIS — Z7189 Other specified counseling: Secondary | ICD-10-CM | POA: Diagnosis not present

## 2019-07-20 DIAGNOSIS — I5032 Chronic diastolic (congestive) heart failure: Secondary | ICD-10-CM | POA: Diagnosis not present

## 2019-07-20 DIAGNOSIS — R0600 Dyspnea, unspecified: Secondary | ICD-10-CM | POA: Diagnosis not present

## 2019-07-20 DIAGNOSIS — I10 Essential (primary) hypertension: Secondary | ICD-10-CM | POA: Diagnosis not present

## 2019-07-20 DIAGNOSIS — N185 Chronic kidney disease, stage 5: Secondary | ICD-10-CM | POA: Diagnosis not present

## 2019-07-20 DIAGNOSIS — R0609 Other forms of dyspnea: Secondary | ICD-10-CM

## 2019-07-20 NOTE — Patient Instructions (Signed)

## 2019-07-22 DIAGNOSIS — I0981 Rheumatic heart failure: Secondary | ICD-10-CM | POA: Diagnosis not present

## 2019-07-22 DIAGNOSIS — N3289 Other specified disorders of bladder: Secondary | ICD-10-CM | POA: Diagnosis not present

## 2019-07-22 DIAGNOSIS — D62 Acute posthemorrhagic anemia: Secondary | ICD-10-CM | POA: Diagnosis not present

## 2019-07-22 DIAGNOSIS — R809 Proteinuria, unspecified: Secondary | ICD-10-CM | POA: Diagnosis not present

## 2019-07-22 DIAGNOSIS — D631 Anemia in chronic kidney disease: Secondary | ICD-10-CM | POA: Diagnosis not present

## 2019-07-22 DIAGNOSIS — E559 Vitamin D deficiency, unspecified: Secondary | ICD-10-CM | POA: Diagnosis not present

## 2019-07-22 DIAGNOSIS — E211 Secondary hyperparathyroidism, not elsewhere classified: Secondary | ICD-10-CM | POA: Diagnosis not present

## 2019-07-22 DIAGNOSIS — I132 Hypertensive heart and chronic kidney disease with heart failure and with stage 5 chronic kidney disease, or end stage renal disease: Secondary | ICD-10-CM | POA: Diagnosis not present

## 2019-07-22 DIAGNOSIS — N185 Chronic kidney disease, stage 5: Secondary | ICD-10-CM | POA: Diagnosis not present

## 2019-07-26 DIAGNOSIS — I0981 Rheumatic heart failure: Secondary | ICD-10-CM | POA: Diagnosis not present

## 2019-07-26 DIAGNOSIS — D62 Acute posthemorrhagic anemia: Secondary | ICD-10-CM | POA: Diagnosis not present

## 2019-07-26 DIAGNOSIS — E1122 Type 2 diabetes mellitus with diabetic chronic kidney disease: Secondary | ICD-10-CM | POA: Diagnosis not present

## 2019-07-26 DIAGNOSIS — I132 Hypertensive heart and chronic kidney disease with heart failure and with stage 5 chronic kidney disease, or end stage renal disease: Secondary | ICD-10-CM | POA: Diagnosis not present

## 2019-07-26 DIAGNOSIS — N189 Chronic kidney disease, unspecified: Secondary | ICD-10-CM | POA: Diagnosis not present

## 2019-07-26 DIAGNOSIS — N3289 Other specified disorders of bladder: Secondary | ICD-10-CM | POA: Diagnosis not present

## 2019-07-26 DIAGNOSIS — N185 Chronic kidney disease, stage 5: Secondary | ICD-10-CM | POA: Diagnosis not present

## 2019-07-26 DIAGNOSIS — D631 Anemia in chronic kidney disease: Secondary | ICD-10-CM | POA: Diagnosis not present

## 2019-07-28 DIAGNOSIS — I0981 Rheumatic heart failure: Secondary | ICD-10-CM | POA: Diagnosis not present

## 2019-07-28 DIAGNOSIS — N3289 Other specified disorders of bladder: Secondary | ICD-10-CM | POA: Diagnosis not present

## 2019-07-28 DIAGNOSIS — I132 Hypertensive heart and chronic kidney disease with heart failure and with stage 5 chronic kidney disease, or end stage renal disease: Secondary | ICD-10-CM | POA: Diagnosis not present

## 2019-07-28 DIAGNOSIS — D62 Acute posthemorrhagic anemia: Secondary | ICD-10-CM | POA: Diagnosis not present

## 2019-07-29 ENCOUNTER — Encounter (HOSPITAL_COMMUNITY)
Admission: RE | Admit: 2019-07-29 | Discharge: 2019-07-29 | Disposition: A | Discharge status: Home / Self Care | Payer: Medicare Other | Source: Ambulatory Visit | Attending: Nephrology | Admitting: Nephrology

## 2019-07-29 ENCOUNTER — Other Ambulatory Visit: Payer: Self-pay

## 2019-07-29 ENCOUNTER — Encounter (HOSPITAL_COMMUNITY): Payer: Self-pay

## 2019-07-29 DIAGNOSIS — N185 Chronic kidney disease, stage 5: Secondary | ICD-10-CM | POA: Diagnosis not present

## 2019-07-29 DIAGNOSIS — R600 Localized edema: Secondary | ICD-10-CM | POA: Diagnosis not present

## 2019-07-29 DIAGNOSIS — D631 Anemia in chronic kidney disease: Secondary | ICD-10-CM | POA: Diagnosis not present

## 2019-07-29 DIAGNOSIS — E211 Secondary hyperparathyroidism, not elsewhere classified: Secondary | ICD-10-CM | POA: Diagnosis not present

## 2019-07-29 DIAGNOSIS — R809 Proteinuria, unspecified: Secondary | ICD-10-CM | POA: Diagnosis not present

## 2019-07-29 LAB — POCT HEMOGLOBIN-HEMACUE: Hemoglobin: 9.3 g/dL — ABNORMAL LOW (ref 12.0–15.0)

## 2019-07-29 MED ORDER — EPOETIN ALFA 3000 UNIT/ML IJ SOLN
INTRAMUSCULAR | Status: AC
  Filled 2019-07-29: qty 1

## 2019-07-29 MED ORDER — EPOETIN ALFA 3000 UNIT/ML IJ SOLN
3000.0000 [IU] | Freq: Once | INTRAMUSCULAR | Status: AC
  Administered 2019-07-29: 3000 [IU] via SUBCUTANEOUS

## 2019-08-03 DIAGNOSIS — I132 Hypertensive heart and chronic kidney disease with heart failure and with stage 5 chronic kidney disease, or end stage renal disease: Secondary | ICD-10-CM | POA: Diagnosis not present

## 2019-08-03 DIAGNOSIS — I0981 Rheumatic heart failure: Secondary | ICD-10-CM | POA: Diagnosis not present

## 2019-08-03 DIAGNOSIS — N3289 Other specified disorders of bladder: Secondary | ICD-10-CM | POA: Diagnosis not present

## 2019-08-03 DIAGNOSIS — D62 Acute posthemorrhagic anemia: Secondary | ICD-10-CM | POA: Diagnosis not present

## 2019-08-08 DIAGNOSIS — N185 Chronic kidney disease, stage 5: Secondary | ICD-10-CM | POA: Diagnosis not present

## 2019-08-08 DIAGNOSIS — E1122 Type 2 diabetes mellitus with diabetic chronic kidney disease: Secondary | ICD-10-CM | POA: Diagnosis not present

## 2019-08-09 DIAGNOSIS — D62 Acute posthemorrhagic anemia: Secondary | ICD-10-CM | POA: Diagnosis not present

## 2019-08-09 DIAGNOSIS — I132 Hypertensive heart and chronic kidney disease with heart failure and with stage 5 chronic kidney disease, or end stage renal disease: Secondary | ICD-10-CM | POA: Diagnosis not present

## 2019-08-09 DIAGNOSIS — N3289 Other specified disorders of bladder: Secondary | ICD-10-CM | POA: Diagnosis not present

## 2019-08-09 DIAGNOSIS — I0981 Rheumatic heart failure: Secondary | ICD-10-CM | POA: Diagnosis not present

## 2019-08-10 DIAGNOSIS — D62 Acute posthemorrhagic anemia: Secondary | ICD-10-CM | POA: Diagnosis not present

## 2019-08-10 DIAGNOSIS — I0981 Rheumatic heart failure: Secondary | ICD-10-CM | POA: Diagnosis not present

## 2019-08-10 DIAGNOSIS — N3289 Other specified disorders of bladder: Secondary | ICD-10-CM | POA: Diagnosis not present

## 2019-08-10 DIAGNOSIS — I132 Hypertensive heart and chronic kidney disease with heart failure and with stage 5 chronic kidney disease, or end stage renal disease: Secondary | ICD-10-CM | POA: Diagnosis not present

## 2019-08-12 ENCOUNTER — Encounter (HOSPITAL_COMMUNITY)
Admission: RE | Admit: 2019-08-12 | Discharge: 2019-08-12 | Disposition: A | Discharge status: Home / Self Care | Payer: Medicare Other | Source: Ambulatory Visit | Attending: Nephrology | Admitting: Nephrology

## 2019-08-12 ENCOUNTER — Other Ambulatory Visit: Payer: Self-pay

## 2019-08-12 ENCOUNTER — Encounter (HOSPITAL_COMMUNITY): Payer: Self-pay

## 2019-08-12 DIAGNOSIS — D631 Anemia in chronic kidney disease: Secondary | ICD-10-CM | POA: Diagnosis not present

## 2019-08-12 DIAGNOSIS — R600 Localized edema: Secondary | ICD-10-CM | POA: Diagnosis not present

## 2019-08-12 DIAGNOSIS — N185 Chronic kidney disease, stage 5: Secondary | ICD-10-CM | POA: Diagnosis not present

## 2019-08-12 DIAGNOSIS — R809 Proteinuria, unspecified: Secondary | ICD-10-CM | POA: Diagnosis not present

## 2019-08-12 DIAGNOSIS — E211 Secondary hyperparathyroidism, not elsewhere classified: Secondary | ICD-10-CM | POA: Diagnosis not present

## 2019-08-12 LAB — POCT HEMOGLOBIN-HEMACUE: Hemoglobin: 9.8 g/dL — ABNORMAL LOW (ref 12.0–15.0)

## 2019-08-12 MED ORDER — EPOETIN ALFA 3000 UNIT/ML IJ SOLN
3000.0000 [IU] | Freq: Once | INTRAMUSCULAR | Status: AC
  Administered 2019-08-12: 13:00:00 3000 [IU] via SUBCUTANEOUS
  Filled 2019-08-12: qty 1

## 2019-08-13 DIAGNOSIS — M48061 Spinal stenosis, lumbar region without neurogenic claudication: Secondary | ICD-10-CM | POA: Diagnosis not present

## 2019-08-13 DIAGNOSIS — G6289 Other specified polyneuropathies: Secondary | ICD-10-CM | POA: Diagnosis not present

## 2019-08-13 DIAGNOSIS — N184 Chronic kidney disease, stage 4 (severe): Secondary | ICD-10-CM | POA: Diagnosis not present

## 2019-08-13 DIAGNOSIS — R2681 Unsteadiness on feet: Secondary | ICD-10-CM | POA: Diagnosis not present

## 2019-08-15 ENCOUNTER — Telehealth: Payer: Self-pay | Admitting: *Deleted

## 2019-08-15 NOTE — Telephone Encounter (Signed)
Received call from patient daughter Donah Driver.   States that she and patient had a long discussion with nephrologist, Dr Theador Hawthorne in regards to her worsening kidney function. States that patient reports that she does not want dialysis.  Per nephrology notes:  CKD stage V No uremic signs or symptoms Patient expressed her wish that she does not want dialysis at her age. Patient did explain at length she understand the risk and the benefit of not going on dialysis Patient family supports her in the decision We will follow patient conservatively  Nephrology recommended that patient consult with PCP for further documentation in her chart in regards to her wishes.   MD please advise.

## 2019-08-15 NOTE — Telephone Encounter (Signed)
Set up telehealth for [name]

## 2019-08-15 NOTE — Telephone Encounter (Signed)
Appointment scheduled.

## 2019-08-16 ENCOUNTER — Other Ambulatory Visit: Payer: Self-pay

## 2019-08-16 ENCOUNTER — Encounter: Payer: Self-pay | Admitting: Family Medicine

## 2019-08-16 ENCOUNTER — Ambulatory Visit (INDEPENDENT_AMBULATORY_CARE_PROVIDER_SITE_OTHER): Payer: Medicare Other | Admitting: Family Medicine

## 2019-08-16 DIAGNOSIS — I132 Hypertensive heart and chronic kidney disease with heart failure and with stage 5 chronic kidney disease, or end stage renal disease: Secondary | ICD-10-CM | POA: Diagnosis not present

## 2019-08-16 DIAGNOSIS — N185 Chronic kidney disease, stage 5: Secondary | ICD-10-CM | POA: Diagnosis not present

## 2019-08-16 DIAGNOSIS — I0981 Rheumatic heart failure: Secondary | ICD-10-CM | POA: Diagnosis not present

## 2019-08-16 DIAGNOSIS — Z66 Do not resuscitate: Secondary | ICD-10-CM | POA: Diagnosis not present

## 2019-08-16 DIAGNOSIS — D62 Acute posthemorrhagic anemia: Secondary | ICD-10-CM | POA: Diagnosis not present

## 2019-08-16 DIAGNOSIS — N3289 Other specified disorders of bladder: Secondary | ICD-10-CM | POA: Diagnosis not present

## 2019-08-16 DIAGNOSIS — I5032 Chronic diastolic (congestive) heart failure: Secondary | ICD-10-CM | POA: Diagnosis not present

## 2019-08-16 NOTE — Progress Notes (Signed)
Virtual Visit via Telephone Note  I connected with [name] on 08/16/19 at 11:05am by telephone and verified that I am speaking with the correct person using two identifiers.      Pt location: at home   Physician location:  In office, [place], Vic Blackbird MD     On call: patient, patient's daughters [name] and [name] and physician   I discussed the limitations, risks, security and privacy concerns of performing an evaluation and management service by telephone and the availability of in person appointments. I also discussed with the patient that there may be a patient responsible charge related to this service. The patient expressed understanding and agreed to proceed.   History of Present Illness: Telehealth visit in the setting of COVID-19 for documentation purposes.  [name] has been following with nephrology secondary to kidney failure.  She is now stage V with no signs of improvement.  She had a visit the other week in which hemodialysis was recommended however she does not want to proceed with dialysis at her age and with her other comorbidities.  Both myself and her cardiologist have also felt that this would be very difficult on her body.  She prefers to just maintain on her medications at this time and then proceed with palliative care when she declines.  We also discussed DNR and generalized objectives for her overall health.  She does not want resuscitation or mechanical ventilation which we discussed.   Observations/Objective: No acute distress noted over the phone patient coherent alert and oriented.  Assessment and Plan: Chronic diastolic heart failure, CKD stage V, anemia of chronic disease.  At her age and with her comorbidities I do think it is the best decision to not pursue aggressive measures and hemodialysis.  Discussed with the family that with time her kidneys will continue to fail and she will display symptoms of this.  At that point we  discussed bringing in palliative care/hospice.  No aggressive measures to be done.  She will be made DNR starting today.  Forms will be completed family can pick this up at the office.  Follow Up Instructions:    I discussed the assessment and treatment plan with the patient. The patient was provided an opportunity to ask questions and all were answered. The patient agreed with the plan and demonstrated an understanding of the instructions.   The patient was advised to call back or seek an in-person evaluation if the symptoms worsen or if the condition fails to improve as anticipated.  I provided 7  minutes of non-face-to-face time during this encounter. End Time 11:12am   Vic Blackbird, MD

## 2019-08-25 ENCOUNTER — Ambulatory Visit (INDEPENDENT_AMBULATORY_CARE_PROVIDER_SITE_OTHER): Payer: Medicare Other | Admitting: Nurse Practitioner

## 2019-08-25 ENCOUNTER — Other Ambulatory Visit: Payer: Self-pay

## 2019-08-25 VITALS — BP 132/74 | HR 75 | Temp 98.2°F | Resp 18 | Ht 64.0 in | Wt 181.0 lb

## 2019-08-25 DIAGNOSIS — D62 Acute posthemorrhagic anemia: Secondary | ICD-10-CM | POA: Diagnosis not present

## 2019-08-25 DIAGNOSIS — I0981 Rheumatic heart failure: Secondary | ICD-10-CM | POA: Diagnosis not present

## 2019-08-25 DIAGNOSIS — N184 Chronic kidney disease, stage 4 (severe): Secondary | ICD-10-CM | POA: Diagnosis not present

## 2019-08-25 DIAGNOSIS — N3289 Other specified disorders of bladder: Secondary | ICD-10-CM | POA: Diagnosis not present

## 2019-08-25 DIAGNOSIS — R399 Unspecified symptoms and signs involving the genitourinary system: Secondary | ICD-10-CM | POA: Diagnosis not present

## 2019-08-25 DIAGNOSIS — I132 Hypertensive heart and chronic kidney disease with heart failure and with stage 5 chronic kidney disease, or end stage renal disease: Secondary | ICD-10-CM | POA: Diagnosis not present

## 2019-08-25 MED ORDER — DOXYCYCLINE HYCLATE 100 MG PO TABS: 100.0000 mg | ORAL_TABLET | Freq: Every day | ORAL | 0 refills | Status: DC

## 2019-08-25 NOTE — Addendum Note (Signed)
Addended by: Vanice Sarah on: 08/25/2019 04:53 PM   Modules accepted: Orders

## 2019-08-25 NOTE — Progress Notes (Signed)
Acute Office Visit  Subjective:    Patient ID: [name], female    DOB: 1924/05/20, 86 y.o.   MRN: [id number]  Chief Complaint  Patient presents with  . Urinary Tract Infection    talking out of head, kidney issues    HPI Patient is a pleasant 86 year old female accompanied by her daughter presenting for UTI sxs that started 2 days ago. The sxs are daughter reported slightly out of mind last night as she has been with prior UTI's, pt reports clear today with urinary urgency and frequency especially at night. She denied fever/chills. Daughter says she is not "out of her mind" today and seems cognitively clear. The pt reportedly has 10% kidney function and refusing dialysis. Time spent discussing prognosis, treatment, and End of life. She desire Hospice referral. Her PCP will be notified returning on Monday to follow up.   Past Medical History:  Diagnosis Date  . Arthritis   . Bilateral carpal tunnel syndrome   . Cataracts, both eyes   . Chronic diastolic CHF (congestive heart failure) ([place])    a. Echo (09/2012): Mild LVH, EF 65-70%, moderately elevated RVSP, moderate TR, mild to moderate MR, mild LAE, grade 2 diastolic dysfunction  . Diabetes mellitus [facility]  . Diabetic neuropathy (HCC) both hands and legs  . GERD (gastroesophageal reflux disease)   . Glaucoma   . Gout   . History of echocardiogram    Echo 1/19:  EF 65-70, no RWMA, Gr 2 DD, mild MR, severe LAE, PASP 62   . History of falling RECENT FALL 1 WK AGO--   NO INJURY  . 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 History of transfusion   . Hyperlipidemia   . Hypertension   . Mitral regurgitation   . PUD (peptic ulcer disease)    many yrs ago  . Right leg weakness   . Rotator cuff tear    left  . Slipped intervertebral disc   . Walker as ambulation aid     Past Surgical History:  Procedure Laterality Date  . ABDOMINAL HYSTERECTOMY  1966  . APPENDECTOMY    . CARDIOVASCULAR STRESS TEST   06-18-2005   DR Daneen Schick   NORMAL STUDY/ NO EVIDENCE ISCHEMIA/ EF 75%  . CARPAL TUNNEL RELEASE  09/17/2011   Procedure: CARPAL TUNNEL RELEASE;  Surgeon: Magnus Sinning, MD;  Location: [facility];  Service: Orthopedics;  Laterality: Right;  . CARPAL TUNNEL RELEASE  10/22/2011   Procedure: CARPAL TUNNEL RELEASE;  Surgeon: Magnus Sinning, MD;  Location: [facility];  Service: Orthopedics;  Laterality: Left;  . EYE SURGERY    . LUMBAR LAMINECTOMY/DECOMPRESSION MICRODISCECTOMY  01/14/2012   Procedure: LUMBAR LAMINECTOMY/DECOMPRESSION MICRODISCECTOMY 3 LEVELS;  Surgeon: Magnus Sinning, MD;  Location: [facility];  Service: Orthopedics;  Laterality: N/A;  Decompression Laminectomy of L2 - L3, L3 - L4 and L4 - L5 Central  (X-Ray)  . SHOULDER OPEN ROTATOR CUFF REPAIR  06/02/2012   Procedure: ROTATOR CUFF REPAIR SHOULDER OPEN;  Surgeon: Magnus Sinning, MD;  Location: [facility];  Service: Orthopedics;  Laterality: Left;  Left Shoulder Open Distal Clavicle Resection Anterior Acrominectomy and Rotator Cuff Repair  . SPINE SURGERY    . TRANSTHORACIC ECHOCARDIOGRAM  12-26-2008  DR Daneen Schick   NORMAL LVF/  EF  71%/   MILD ASYMMETRIC SEPTAL HYPERTROPHY/ MILD LEFT ATRIAL ENLARGEMENT/ MODERATELY ELEVATED ESTIMATED RIGHT VENTRICULAR  SYSTOLIC PRESSURE/ MILD MITRAL  &  TRICUSPID VALVE REGURG.    Family History  Problem Relation Age of Onset  . Hypertension Father   . Hypertension Mother   . Stroke Mother   . Diabetes Sister   . Hypertension Sister   . Cancer Sister   . Arthritis Sister   . Hypertension Brother   . Alcohol abuse Brother   . Hypertension Sister   . Arthritis Sister   . Hypertension Sister   . Arthritis Sister   . Arthritis Sister   . Arthritis Sister   . Heart attack Son   . Colon cancer Neg Hx     Social History   Socioeconomic History  . Marital status: Widowed    Spouse name: Not on file  . Number of children: Not on file  . Years of  education: Not on file  . Highest education level: Not on file  Occupational History  . Not on file  Tobacco Use  . Smoking status: Former Smoker    Packs/day: 1.00    Years: 30.00    Pack years: 30.00    Types: Cigarettes    Quit date: 05/28/1968    Years since quitting: 51.2  . Smokeless tobacco: Never Used  Substance and Sexual Activity  . Alcohol use: No  . Drug use: No  . Sexual activity: Not on file  Other Topics Concern  . Not on file  Social History Narrative  . Not on file   Social Determinants of Health   Financial Resource Strain:   . Difficulty of Paying Living Expenses:   Food Insecurity:   . Worried About Charity fundraiser in the Last Year:   . Arboriculturist in the Last Year:   Transportation Needs:   . Film/video editor (Medical):   Marland Kitchen Lack of Transportation (Non-Medical):   Physical Activity:   . Days of Exercise per Week:   . Minutes of Exercise per Session:   Stress:   . Feeling of Stress :   Social Connections:   . Frequency of Communication with Friends and Family:   . Frequency of Social Gatherings with Friends and Family:   . Attends Religious Services:   . Active Member of Clubs or Organizations:   . Attends Archivist Meetings:   Marland Kitchen Marital Status:   Intimate Partner Violence:   . Fear of Current or Ex-Partner:   . Emotionally Abused:   Marland Kitchen Physically Abused:   . Sexually Abused:     Outpatient Medications Prior to Visit  Medication Sig Dispense Refill  . acetaminophen (TYLENOL) 500 MG tablet Take 1,500 mg by mouth every 6 (six) hours as needed for mild pain.    Marland Kitchen allopurinol (ZYLOPRIM) 300 MG tablet TAKE 1 TABLET BY MOUTH  DAILY WITH BREAKFAST 90 tablet 3  . amLODipine (NORVASC) 5 MG tablet TAKE 1 TABLET BY MOUTH  DAILY 90 tablet 3  . dorzolamide-timolol (COSOPT) 22.3-6.8 MG/ML ophthalmic solution Place 1 drop into the right eye 2 (two) times daily.    Marland Kitchen epoetin alfa (EPOGEN) 3000 UNIT/ML injection Inject 3,000 Units into  the vein every 14 (fourteen) days.    . furosemide (LASIX) 40 MG tablet Take 40 mg by mouth daily.    . hydrALAZINE (APRESOLINE) 10 MG tablet TAKE 1 TABLET BY MOUTH  TWICE DAILY 180 tablet 1  . omeprazole (PRILOSEC) 40 MG capsule TAKE 1 CAPSULE BY MOUTH  DAILY FOR REFLUX 90 capsule 3  . pravastatin (  PRAVACHOL) 20 MG tablet TAKE 1 TABLET BY MOUTH IN  THE EVENING 90 tablet 1  . QUEtiapine (SEROQUEL) 25 MG tablet Take 25 mg by mouth at bedtime.    . timolol (BETIMOL) 0.5 % ophthalmic solution Place 1 drop into the left eye daily.    . Vitamin D, Ergocalciferol, (DRISDOL) 1.25 MG (50000 UNIT) CAPS capsule Take 1 capsule by mouth once a week.     No facility-administered medications prior to visit.    No Known Allergies  Review of Systems  All other systems reviewed and are negative.      Objective:    Physical Exam Vitals and nursing note reviewed.  Constitutional:      General: She is not in acute distress.    Appearance: Normal appearance. She is well-developed and well-groomed. She is not ill-appearing, toxic-appearing or diaphoretic.  HENT:     Head: Normocephalic.     Nose: Nose normal.     Mouth/Throat:     Mouth: Mucous membranes are moist.  Eyes:     Extraocular Movements: Extraocular movements intact.     Conjunctiva/sclera: Conjunctivae normal.     Pupils: Pupils are equal, round, and reactive to light.  Cardiovascular:     Rate and Rhythm: Normal rate.  Pulmonary:     Effort: Pulmonary effort is normal.  Abdominal:     General: Abdomen is flat.     Palpations: Abdomen is soft.     Tenderness: There is no right CVA tenderness or left CVA tenderness.  Musculoskeletal:        General: Normal range of motion.     Cervical back: Normal range of motion and neck supple.     Right lower leg: No edema.     Left lower leg: No edema.  Skin:    General: Skin is warm and dry.     Capillary Refill: Capillary refill takes less than 2 seconds.  Neurological:     General: No  focal deficit present.     Mental Status: She is alert and oriented to person, place, and time.  Psychiatric:        Attention and Perception: Attention normal.        Mood and Affect: Mood normal.        Speech: Speech normal.        Behavior: Behavior normal. Behavior is cooperative.        Cognition and Memory: Cognition normal.     BP 132/74 (BP Location: Left Arm, Patient Position: Sitting, Cuff Size: Normal)   Pulse 75   Temp 98.2 F (36.8 C) (Temporal)   Resp 18   Ht 5\' 4"  (1.626 m)   Wt 181 lb (82.1 kg)   SpO2 96%   BMI 31.07 kg/m  Wt Readings from Last 3 Encounters:  08/25/19 181 lb (82.1 kg)  08/12/19 189 lb (85.7 kg)  07/20/19 188 lb (85.3 kg)    Health Maintenance Due  Topic Date Due  . TETANUS/TDAP  Never done  . DEXA SCAN  Never done  . OPHTHALMOLOGY EXAM  08/14/2016  . FOOT EXAM  09/11/2018    There are no preventive care reminders to display for this patient.   Lab Results  Component Value Date   TSH 0.912 07/06/2019   Lab Results  Component Value Date   WBC 7.4 07/11/2019   HGB 9.8 (L) 08/12/2019   HCT 25.5 (L) 07/11/2019   MCV 94.4 07/11/2019   PLT 211 07/11/2019   Lab Results  Component Value Date   NA 142 07/11/2019   K 4.6 07/11/2019   CO2 24 07/11/2019   GLUCOSE 90 07/11/2019   BUN 56 (H) 07/11/2019   CREATININE 3.62 (H) 07/11/2019   BILITOT 0.7 07/11/2019   ALKPHOS 86 07/11/2019   AST 11 (L) 07/11/2019   ALT 9 07/11/2019   PROT 5.2 (L) 07/11/2019   ALBUMIN 2.4 (L) 07/11/2019   CALCIUM 8.7 (L) 07/11/2019   ANIONGAP 12 07/11/2019   GFR 51.01 (L) 07/20/2014   Lab Results  Component Value Date   CHOL 162 11/17/2018   Lab Results  Component Value Date   HDL 45 (L) 11/17/2018   Lab Results  Component Value Date   LDLCALC 91 11/17/2018   Lab Results  Component Value Date   TRIG 165 (H) 11/17/2018   Lab Results  Component Value Date   CHOLHDL 3.6 11/17/2018   Lab Results  Component Value Date   HGBA1C 5.9 (H)  04/27/2019       Assessment & Plan:   Problem List Items Addressed This Visit    None    Visit Diagnoses    UTI symptoms    -  Primary   Relevant Medications   doxycycline (VIBRA-TABS) 100 MG tablet   Other Relevant Orders   Urinalysis, Routine w reflex microscopic   CKD (chronic kidney disease) stage 4, GFR 15-29 ml/min (HCC)       Relevant Orders   Ambulatory referral to Hospice       Meds ordered this encounter  Medications  . doxycycline (VIBRA-TABS) 100 MG tablet    Sig: Take 1 tablet (100 mg total) by mouth daily for 7 days.    Dispense:  7 tablet    Refill:  [place], FNP

## 2019-08-29 ENCOUNTER — Encounter (HOSPITAL_COMMUNITY): Payer: Self-pay

## 2019-08-29 ENCOUNTER — Encounter (HOSPITAL_COMMUNITY): Payer: Medicare Other

## 2019-08-29 ENCOUNTER — Other Ambulatory Visit: Payer: Self-pay

## 2019-08-29 ENCOUNTER — Encounter (HOSPITAL_COMMUNITY)
Admission: RE | Admit: 2019-08-29 | Discharge: 2019-08-29 | Disposition: A | Discharge status: Home / Self Care | Payer: Medicare Other | Source: Ambulatory Visit | Attending: Nephrology | Admitting: Nephrology

## 2019-08-29 DIAGNOSIS — D631 Anemia in chronic kidney disease: Secondary | ICD-10-CM | POA: Diagnosis not present

## 2019-08-29 DIAGNOSIS — N185 Chronic kidney disease, stage 5: Secondary | ICD-10-CM | POA: Diagnosis not present

## 2019-08-29 LAB — URINALYSIS, ROUTINE W REFLEX MICROSCOPIC
Bacteria, UA: NONE SEEN /HPF
Bilirubin Urine: NEGATIVE
Glucose, UA: NEGATIVE
Hyaline Cast: NONE SEEN /LPF
Ketones, ur: NEGATIVE
Leukocytes,Ua: NEGATIVE
Nitrite: NEGATIVE
Specific Gravity, Urine: 1.02 (ref 1.001–1.03)
WBC, UA: NONE SEEN /HPF (ref 0–5)
pH: 6 (ref 5.0–8.0)

## 2019-08-29 LAB — MICROSCOPIC MESSAGE

## 2019-08-29 LAB — POCT HEMOGLOBIN-HEMACUE: Hemoglobin: 8.6 g/dL — ABNORMAL LOW (ref 12.0–15.0)

## 2019-08-29 MED ORDER — EPOETIN ALFA 3000 UNIT/ML IJ SOLN
3000.0000 [IU] | Freq: Once | INTRAMUSCULAR | Status: AC
  Administered 2019-08-29: 11:00:00 3000 [IU] via SUBCUTANEOUS

## 2019-08-29 MED ORDER — EPOETIN ALFA 3000 UNIT/ML IJ SOLN
INTRAMUSCULAR | Status: AC
  Filled 2019-08-29: qty 1

## 2019-08-30 ENCOUNTER — Telehealth: Payer: Self-pay | Admitting: Family Medicine

## 2019-08-30 MED ORDER — CEPHALEXIN 250 MG PO CAPS: 250.0000 mg | ORAL_CAPSULE | Freq: Two times a day (BID) | ORAL | 0 refills | Status: AC

## 2019-08-30 NOTE — Telephone Encounter (Signed)
Call placed to patient and patient daughter [name] made aware.   States that patient is still having Sx. Prescription sent to pharmacy.   Of note, Hospice nurse was present for evaluation.

## 2019-08-30 NOTE — Telephone Encounter (Signed)
Patient calling to talk to you regarding her condition, would like to talk today if possible  [phone]

## 2019-08-30 NOTE — Telephone Encounter (Signed)
Call pt, I have okayed hospice services   If she is still having UTI symptoms, recommend D/C doxycyline Start keflex 250mg  BID x 5 days

## 2019-08-30 NOTE — Telephone Encounter (Signed)
Call placed to patient.   States that she saw NP last week for UTI. Reports that she would like to begin Hospice services.  Noted that referral is in [place], but [facility] does not have referral at this time.   VO given to Cassandra for hospice services for CHF, CKD. Orders given for PCP to remain primary, but for medical director to take over pain management.   Demographic and chart notes sent to Hospice at [phone]~ fax.

## 2019-08-31 ENCOUNTER — Ambulatory Visit: Payer: Medicare Other | Admitting: Interventional Cardiology

## 2019-09-12 ENCOUNTER — Encounter (HOSPITAL_COMMUNITY)
Admission: RE | Admit: 2019-09-12 | Discharge: 2019-09-12 | Disposition: A | Discharge status: Home / Self Care | Payer: Medicare Other | Source: Ambulatory Visit | Attending: Nephrology | Admitting: Nephrology

## 2019-09-12 ENCOUNTER — Encounter (HOSPITAL_COMMUNITY): Payer: Medicare Other

## 2019-09-20 ENCOUNTER — Telehealth: Payer: Self-pay | Admitting: Family Medicine

## 2019-09-20 NOTE — Telephone Encounter (Signed)
I received documentation on hospice.  Patient passed away on 10/15/2019 at 10:37 PM  I spoke with pt daughter Rodney Cruise and gave my condolences

## 2019-09-24 DEATH — deceased

## 2019-09-28 ENCOUNTER — Encounter (HOSPITAL_COMMUNITY): Payer: Self-pay

## 2019-11-16 ENCOUNTER — Ambulatory Visit: Payer: Medicare Other | Admitting: Urology

## 2019-11-16 ENCOUNTER — Ambulatory Visit: Payer: Medicare Other | Admitting: Family Medicine

## 2020-08-05 IMAGING — DX DG CHEST 1V PORT
1 series · 1 of 1 positions shown · non-contrast
Comparison: November 04, 2017

CLINICAL DATA: Chills.

EXAM:
PORTABLE CHEST 1 VIEW

[chest ap]
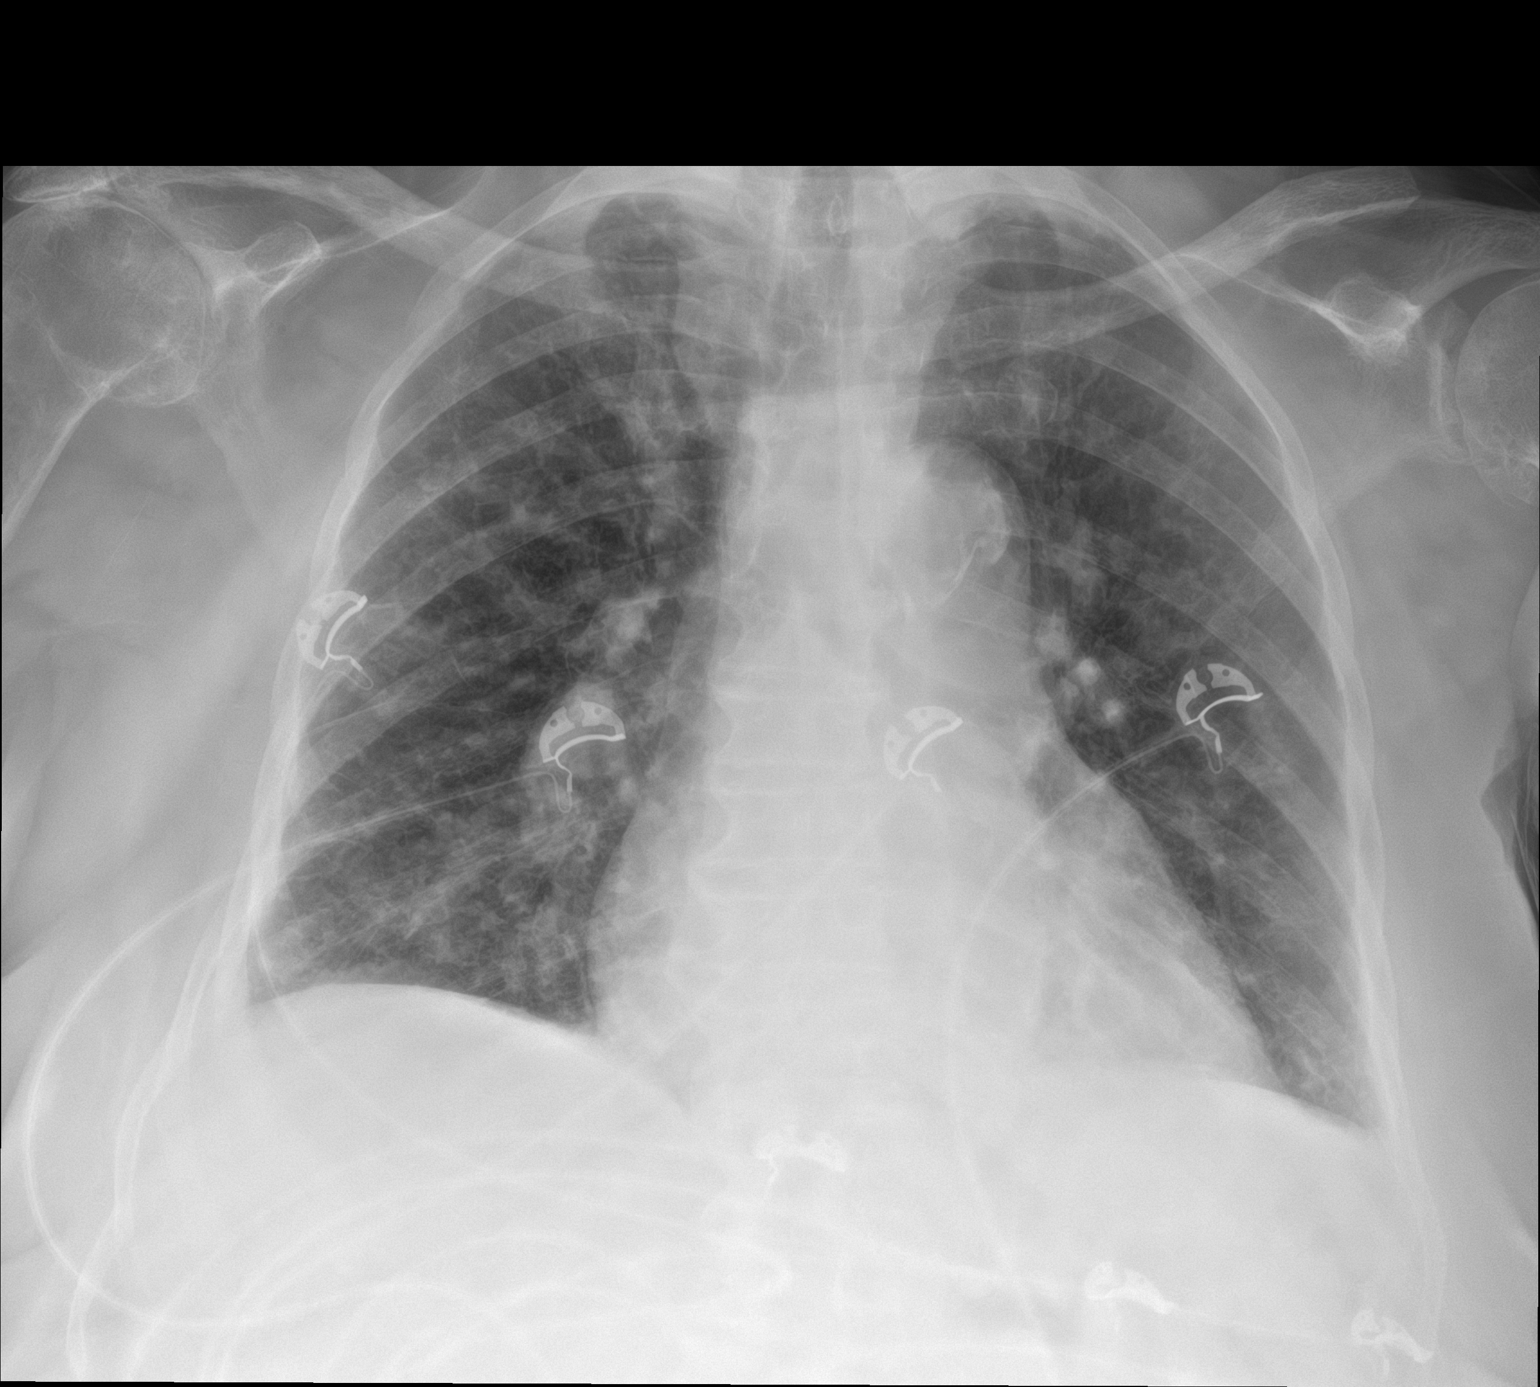

[1 of 1 positions shown; findings below may reference images not displayed]

FINDINGS: Mild diffuse infiltrates are noted. There is no evidence of a
pleural effusion or pneumothorax. The cardiac silhouette is mildly
enlarged. There is marked severity calcification of the aortic arch.
Degenerative changes are seen within the thoracic spine.
IMPRESSION: 1. Mild diffuse bilateral infiltrates.
